# Patient Record
Sex: Female | Born: 1970
Health system: Southern US, Community
[De-identification: ages and names within clinical notes are randomized; demographics above are authoritative.]

## PROBLEM LIST (undated history)

## (undated) DIAGNOSIS — G43909 Migraine, unspecified, not intractable, without status migrainosus: Secondary | ICD-10-CM

## (undated) DIAGNOSIS — R110 Nausea: Secondary | ICD-10-CM

## (undated) DIAGNOSIS — N926 Irregular menstruation, unspecified: Secondary | ICD-10-CM

## (undated) DIAGNOSIS — M549 Dorsalgia, unspecified: Secondary | ICD-10-CM

## (undated) DIAGNOSIS — R599 Enlarged lymph nodes, unspecified: Secondary | ICD-10-CM

## (undated) DIAGNOSIS — M2550 Pain in unspecified joint: Secondary | ICD-10-CM

## (undated) DIAGNOSIS — K5900 Constipation, unspecified: Secondary | ICD-10-CM

## (undated) DIAGNOSIS — Z8041 Family history of malignant neoplasm of ovary: Secondary | ICD-10-CM

## (undated) DIAGNOSIS — K589 Irritable bowel syndrome without diarrhea: Secondary | ICD-10-CM

## (undated) DIAGNOSIS — I493 Ventricular premature depolarization: Secondary | ICD-10-CM

## (undated) DIAGNOSIS — Z87442 Personal history of urinary calculi: Secondary | ICD-10-CM

## (undated) DIAGNOSIS — E669 Obesity, unspecified: Secondary | ICD-10-CM

## (undated) DIAGNOSIS — R519 Headache, unspecified: Secondary | ICD-10-CM

## (undated) DIAGNOSIS — K529 Noninfective gastroenteritis and colitis, unspecified: Secondary | ICD-10-CM

## (undated) DIAGNOSIS — K219 Gastro-esophageal reflux disease without esophagitis: Secondary | ICD-10-CM

## (undated) DIAGNOSIS — E559 Vitamin D deficiency, unspecified: Secondary | ICD-10-CM

## (undated) DIAGNOSIS — R002 Palpitations: Secondary | ICD-10-CM

## (undated) DIAGNOSIS — R51 Headache: Secondary | ICD-10-CM

## (undated) DIAGNOSIS — R079 Chest pain, unspecified: Secondary | ICD-10-CM

## (undated) DIAGNOSIS — N200 Calculus of kidney: Secondary | ICD-10-CM

## (undated) DIAGNOSIS — N840 Polyp of corpus uteri: Secondary | ICD-10-CM

## (undated) DIAGNOSIS — K449 Diaphragmatic hernia without obstruction or gangrene: Secondary | ICD-10-CM

## (undated) DIAGNOSIS — N920 Excessive and frequent menstruation with regular cycle: Secondary | ICD-10-CM

## (undated) DIAGNOSIS — G4700 Insomnia, unspecified: Secondary | ICD-10-CM

## (undated) DIAGNOSIS — R0600 Dyspnea, unspecified: Secondary | ICD-10-CM

## (undated) DIAGNOSIS — K649 Unspecified hemorrhoids: Secondary | ICD-10-CM

## (undated) DIAGNOSIS — M25559 Pain in unspecified hip: Secondary | ICD-10-CM

## (undated) HISTORY — DX: Dorsalgia, unspecified: M54.9

## (undated) HISTORY — DX: Diaphragmatic hernia without obstruction or gangrene: K44.9

## (undated) HISTORY — DX: Family history of malignant neoplasm of ovary: Z80.41

## (undated) HISTORY — PX: ABLATION: SHX5711

## (undated) HISTORY — DX: Irritable bowel syndrome, unspecified: K58.9

## (undated) HISTORY — DX: Excessive and frequent menstruation with regular cycle: N92.0

## (undated) HISTORY — DX: Palpitations: R00.2

## (undated) HISTORY — DX: Vitamin D deficiency, unspecified: E55.9

## (undated) HISTORY — DX: Calculus of kidney: N20.0

## (undated) HISTORY — DX: Enlarged lymph nodes, unspecified: R59.9

## (undated) HISTORY — DX: Nausea: R11.0

## (undated) HISTORY — DX: Insomnia, unspecified: G47.00

## (undated) HISTORY — DX: Pain in unspecified joint: M25.50

## (undated) HISTORY — PX: KIDNEY STONE SURGERY: SHX686

## (undated) HISTORY — DX: Pain in unspecified hip: M25.559

## (undated) HISTORY — DX: Migraine, unspecified, not intractable, without status migrainosus: G43.909

## (undated) HISTORY — DX: Obesity, unspecified: E66.9

## (undated) HISTORY — DX: Headache: R51

## (undated) HISTORY — DX: Dyspnea, unspecified: R06.00

## (undated) HISTORY — DX: Headache, unspecified: R51.9

## (undated) HISTORY — DX: Ventricular premature depolarization: I49.3

## (undated) HISTORY — DX: Noninfective gastroenteritis and colitis, unspecified: K52.9

## (undated) HISTORY — DX: Unspecified hemorrhoids: K64.9

## (undated) HISTORY — DX: Polyp of corpus uteri: N84.0

## (undated) HISTORY — DX: Chest pain, unspecified: R07.9

## (undated) HISTORY — DX: Gastro-esophageal reflux disease without esophagitis: K21.9

## (undated) HISTORY — DX: Irregular menstruation, unspecified: N92.6

## (undated) HISTORY — DX: Constipation, unspecified: K59.00

---

## 2000-06-14 ENCOUNTER — Encounter: Payer: Self-pay | Admitting: Obstetrics and Gynecology

## 2000-06-14 ENCOUNTER — Ambulatory Visit (HOSPITAL_COMMUNITY): Admission: RE | Admit: 2000-06-14 | Discharge: 2000-06-14 | Payer: Self-pay | Admitting: Obstetrics and Gynecology

## 2001-05-23 ENCOUNTER — Encounter: Payer: Self-pay | Admitting: Internal Medicine

## 2001-05-23 ENCOUNTER — Ambulatory Visit (HOSPITAL_COMMUNITY): Admission: RE | Admit: 2001-05-23 | Discharge: 2001-05-23 | Payer: Self-pay | Admitting: Internal Medicine

## 2001-06-27 ENCOUNTER — Ambulatory Visit (HOSPITAL_COMMUNITY): Admission: RE | Admit: 2001-06-27 | Discharge: 2001-06-27 | Payer: Self-pay | Admitting: Neurology

## 2001-06-27 ENCOUNTER — Encounter: Payer: Self-pay | Admitting: Neurology

## 2002-05-22 ENCOUNTER — Ambulatory Visit (HOSPITAL_COMMUNITY): Admission: RE | Admit: 2002-05-22 | Discharge: 2002-05-22 | Payer: Self-pay | Admitting: Internal Medicine

## 2002-05-22 ENCOUNTER — Encounter: Payer: Self-pay | Admitting: Internal Medicine

## 2002-05-25 ENCOUNTER — Ambulatory Visit (HOSPITAL_COMMUNITY): Admission: RE | Admit: 2002-05-25 | Discharge: 2002-05-25 | Payer: Self-pay | Admitting: Internal Medicine

## 2002-05-25 ENCOUNTER — Encounter: Payer: Self-pay | Admitting: Internal Medicine

## 2002-08-17 ENCOUNTER — Encounter: Payer: Self-pay | Admitting: *Deleted

## 2002-08-17 ENCOUNTER — Emergency Department (HOSPITAL_COMMUNITY): Admission: EM | Admit: 2002-08-17 | Discharge: 2002-08-17 | Payer: Self-pay | Admitting: *Deleted

## 2003-01-19 HISTORY — PX: COLONOSCOPY: SHX174

## 2003-06-13 ENCOUNTER — Inpatient Hospital Stay (HOSPITAL_COMMUNITY): Admission: EM | Admit: 2003-06-13 | Discharge: 2003-06-15 | Payer: Self-pay | Admitting: *Deleted

## 2003-07-29 ENCOUNTER — Emergency Department (HOSPITAL_COMMUNITY): Admission: EM | Admit: 2003-07-29 | Discharge: 2003-07-29 | Payer: Self-pay | Admitting: Emergency Medicine

## 2003-09-12 ENCOUNTER — Ambulatory Visit (HOSPITAL_COMMUNITY): Admission: RE | Admit: 2003-09-12 | Discharge: 2003-09-12 | Payer: Self-pay | Admitting: Urology

## 2004-01-19 DIAGNOSIS — J189 Pneumonia, unspecified organism: Secondary | ICD-10-CM

## 2004-01-19 HISTORY — DX: Pneumonia, unspecified organism: J18.9

## 2004-02-01 ENCOUNTER — Emergency Department (HOSPITAL_COMMUNITY): Admission: EM | Admit: 2004-02-01 | Discharge: 2004-02-01 | Payer: Self-pay | Admitting: Emergency Medicine

## 2004-06-18 ENCOUNTER — Ambulatory Visit (HOSPITAL_COMMUNITY): Admission: RE | Admit: 2004-06-18 | Discharge: 2004-06-18 | Payer: Self-pay | Admitting: Obstetrics and Gynecology

## 2004-11-24 ENCOUNTER — Inpatient Hospital Stay (HOSPITAL_COMMUNITY): Admission: RE | Admit: 2004-11-24 | Discharge: 2004-11-25 | Payer: Self-pay | Admitting: Obstetrics and Gynecology

## 2005-02-24 ENCOUNTER — Emergency Department (HOSPITAL_COMMUNITY): Admission: EM | Admit: 2005-02-24 | Discharge: 2005-02-24 | Payer: Self-pay | Admitting: Emergency Medicine

## 2005-10-21 ENCOUNTER — Ambulatory Visit: Payer: Self-pay | Admitting: Internal Medicine

## 2005-10-25 ENCOUNTER — Ambulatory Visit: Payer: Self-pay | Admitting: Internal Medicine

## 2005-10-29 ENCOUNTER — Ambulatory Visit (HOSPITAL_COMMUNITY): Admission: RE | Admit: 2005-10-29 | Discharge: 2005-10-29 | Payer: Self-pay | Admitting: Orthopaedic Surgery

## 2006-01-02 ENCOUNTER — Emergency Department (HOSPITAL_COMMUNITY): Admission: EM | Admit: 2006-01-02 | Discharge: 2006-01-02 | Payer: Self-pay | Admitting: Emergency Medicine

## 2006-01-07 ENCOUNTER — Ambulatory Visit: Payer: Self-pay | Admitting: Internal Medicine

## 2006-01-18 HISTORY — PX: KNEE ARTHROSCOPY: SHX127

## 2006-01-18 HISTORY — PX: OTHER SURGICAL HISTORY: SHX169

## 2006-02-01 ENCOUNTER — Encounter (INDEPENDENT_AMBULATORY_CARE_PROVIDER_SITE_OTHER): Payer: Self-pay | Admitting: Specialist

## 2006-02-01 ENCOUNTER — Ambulatory Visit (HOSPITAL_COMMUNITY): Admission: RE | Admit: 2006-02-01 | Discharge: 2006-02-01 | Payer: Self-pay | Admitting: Internal Medicine

## 2006-02-01 ENCOUNTER — Ambulatory Visit: Payer: Self-pay | Admitting: Internal Medicine

## 2006-04-06 ENCOUNTER — Encounter (HOSPITAL_COMMUNITY): Admission: RE | Admit: 2006-04-06 | Discharge: 2006-05-06 | Payer: Self-pay | Admitting: Specialist

## 2006-05-10 ENCOUNTER — Encounter (HOSPITAL_COMMUNITY): Admission: RE | Admit: 2006-05-10 | Discharge: 2006-06-09 | Payer: Self-pay | Admitting: Specialist

## 2006-06-10 ENCOUNTER — Encounter (HOSPITAL_COMMUNITY): Admission: RE | Admit: 2006-06-10 | Discharge: 2006-07-10 | Payer: Self-pay | Admitting: Specialist

## 2006-07-12 ENCOUNTER — Encounter (HOSPITAL_COMMUNITY): Admission: RE | Admit: 2006-07-12 | Discharge: 2006-08-11 | Payer: Self-pay | Admitting: Specialist

## 2007-01-18 ENCOUNTER — Ambulatory Visit: Payer: Self-pay | Admitting: Internal Medicine

## 2007-03-06 ENCOUNTER — Encounter: Payer: Self-pay | Admitting: Cardiovascular Disease

## 2007-03-09 ENCOUNTER — Ambulatory Visit (HOSPITAL_COMMUNITY): Admission: RE | Admit: 2007-03-09 | Discharge: 2007-03-09 | Payer: Self-pay | Admitting: Cardiovascular Disease

## 2007-03-09 ENCOUNTER — Ambulatory Visit: Payer: Self-pay | Admitting: Cardiovascular Disease

## 2007-03-13 ENCOUNTER — Ambulatory Visit: Payer: Self-pay | Admitting: Cardiology

## 2007-03-13 ENCOUNTER — Ambulatory Visit (HOSPITAL_COMMUNITY): Admission: RE | Admit: 2007-03-13 | Discharge: 2007-03-13 | Payer: Self-pay | Admitting: Cardiovascular Disease

## 2007-03-16 ENCOUNTER — Ambulatory Visit: Payer: Self-pay | Admitting: Cardiovascular Disease

## 2007-04-05 ENCOUNTER — Ambulatory Visit: Payer: Self-pay | Admitting: Internal Medicine

## 2007-04-13 ENCOUNTER — Ambulatory Visit (HOSPITAL_COMMUNITY): Admission: RE | Admit: 2007-04-13 | Discharge: 2007-04-13 | Payer: Self-pay | Admitting: Orthopaedic Surgery

## 2007-04-24 ENCOUNTER — Other Ambulatory Visit: Admission: RE | Admit: 2007-04-24 | Discharge: 2007-04-24 | Payer: Self-pay | Admitting: Obstetrics and Gynecology

## 2007-05-03 ENCOUNTER — Ambulatory Visit (HOSPITAL_COMMUNITY): Admission: RE | Admit: 2007-05-03 | Discharge: 2007-05-03 | Payer: Self-pay | Admitting: Obstetrics and Gynecology

## 2007-06-13 ENCOUNTER — Encounter (HOSPITAL_COMMUNITY): Admission: RE | Admit: 2007-06-13 | Discharge: 2007-07-13 | Payer: Self-pay | Admitting: Orthopaedic Surgery

## 2007-08-17 ENCOUNTER — Ambulatory Visit (HOSPITAL_COMMUNITY): Admission: RE | Admit: 2007-08-17 | Discharge: 2007-08-17 | Payer: Self-pay | Admitting: Orthopaedic Surgery

## 2007-09-21 DIAGNOSIS — R110 Nausea: Secondary | ICD-10-CM | POA: Insufficient documentation

## 2007-09-21 DIAGNOSIS — K5289 Other specified noninfective gastroenteritis and colitis: Secondary | ICD-10-CM | POA: Insufficient documentation

## 2007-09-21 DIAGNOSIS — K5900 Constipation, unspecified: Secondary | ICD-10-CM | POA: Insufficient documentation

## 2007-09-21 DIAGNOSIS — E669 Obesity, unspecified: Secondary | ICD-10-CM | POA: Insufficient documentation

## 2007-09-21 DIAGNOSIS — R109 Unspecified abdominal pain: Secondary | ICD-10-CM | POA: Insufficient documentation

## 2007-09-21 DIAGNOSIS — R197 Diarrhea, unspecified: Secondary | ICD-10-CM | POA: Insufficient documentation

## 2007-09-21 DIAGNOSIS — K589 Irritable bowel syndrome without diarrhea: Secondary | ICD-10-CM | POA: Insufficient documentation

## 2007-09-27 ENCOUNTER — Emergency Department (HOSPITAL_COMMUNITY): Admission: EM | Admit: 2007-09-27 | Discharge: 2007-09-27 | Payer: Self-pay | Admitting: Emergency Medicine

## 2008-01-04 ENCOUNTER — Ambulatory Visit: Payer: Self-pay | Admitting: Internal Medicine

## 2008-01-05 ENCOUNTER — Ambulatory Visit (HOSPITAL_COMMUNITY): Admission: RE | Admit: 2008-01-05 | Discharge: 2008-01-05 | Payer: Self-pay | Admitting: Internal Medicine

## 2008-01-24 ENCOUNTER — Encounter: Payer: Self-pay | Admitting: Gastroenterology

## 2008-01-24 LAB — CONVERTED CEMR LAB
Helicobacter Pylori Antibody-IgG: 0.6
IgA: 186 mg/dL (ref 68–378)
Tissue Transglutaminase Ab, IgA: 0.3 units (ref <7)

## 2008-02-19 HISTORY — PX: ESOPHAGOGASTRODUODENOSCOPY: SHX1529

## 2008-03-08 ENCOUNTER — Emergency Department (HOSPITAL_COMMUNITY): Admission: EM | Admit: 2008-03-08 | Discharge: 2008-03-08 | Payer: Self-pay | Admitting: Emergency Medicine

## 2008-03-13 ENCOUNTER — Ambulatory Visit (HOSPITAL_COMMUNITY): Admission: RE | Admit: 2008-03-13 | Discharge: 2008-03-13 | Payer: Self-pay | Admitting: Internal Medicine

## 2008-03-13 ENCOUNTER — Ambulatory Visit: Payer: Self-pay | Admitting: Internal Medicine

## 2008-03-19 ENCOUNTER — Ambulatory Visit (HOSPITAL_COMMUNITY): Admission: RE | Admit: 2008-03-19 | Discharge: 2008-03-19 | Payer: Self-pay | Admitting: Internal Medicine

## 2008-05-01 ENCOUNTER — Encounter: Payer: Self-pay | Admitting: Internal Medicine

## 2008-05-02 ENCOUNTER — Ambulatory Visit: Payer: Self-pay | Admitting: Internal Medicine

## 2008-05-09 LAB — CONVERTED CEMR LAB
Basophils Absolute: 0 10*3/uL (ref 0.0–0.1)
Basophils Relative: 0 % (ref 0–1)
Eosinophils Absolute: 0.1 10*3/uL (ref 0.0–0.7)
Eosinophils Relative: 1 % (ref 0–5)
HCT: 39.5 % (ref 36.0–46.0)
Hemoglobin: 12.8 g/dL (ref 12.0–15.0)
Lymphocytes Relative: 26 % (ref 12–46)
Lymphs Abs: 2.3 10*3/uL (ref 0.7–4.0)
MCHC: 32.4 g/dL (ref 30.0–36.0)
MCV: 82.5 fL (ref 78.0–100.0)
Monocytes Absolute: 0.7 10*3/uL (ref 0.1–1.0)
Monocytes Relative: 8 % (ref 3–12)
Neutro Abs: 5.7 10*3/uL (ref 1.7–7.7)
Neutrophils Relative %: 65 % (ref 43–77)
Platelets: 277 10*3/uL (ref 150–400)
RBC: 4.79 M/uL (ref 3.87–5.11)
RDW: 13.4 % (ref 11.5–15.5)
WBC: 8.8 10*3/uL (ref 4.0–10.5)

## 2008-05-30 ENCOUNTER — Ambulatory Visit (HOSPITAL_COMMUNITY): Admission: RE | Admit: 2008-05-30 | Discharge: 2008-05-30 | Payer: Self-pay | Admitting: Obstetrics and Gynecology

## 2008-07-17 DIAGNOSIS — R0602 Shortness of breath: Secondary | ICD-10-CM | POA: Insufficient documentation

## 2008-07-17 DIAGNOSIS — I493 Ventricular premature depolarization: Secondary | ICD-10-CM | POA: Insufficient documentation

## 2008-07-17 DIAGNOSIS — R079 Chest pain, unspecified: Secondary | ICD-10-CM | POA: Insufficient documentation

## 2008-07-17 DIAGNOSIS — R002 Palpitations: Secondary | ICD-10-CM | POA: Insufficient documentation

## 2008-07-17 DIAGNOSIS — I4949 Other premature depolarization: Secondary | ICD-10-CM | POA: Insufficient documentation

## 2008-07-18 ENCOUNTER — Encounter: Payer: Self-pay | Admitting: Gastroenterology

## 2009-09-09 ENCOUNTER — Other Ambulatory Visit: Admission: RE | Admit: 2009-09-09 | Discharge: 2009-09-09 | Payer: Self-pay | Admitting: Obstetrics and Gynecology

## 2009-09-12 ENCOUNTER — Ambulatory Visit (HOSPITAL_COMMUNITY): Admission: RE | Admit: 2009-09-12 | Discharge: 2009-09-12 | Payer: Self-pay | Admitting: Obstetrics & Gynecology

## 2009-11-11 ENCOUNTER — Ambulatory Visit: Payer: Self-pay | Admitting: Cardiology

## 2009-11-13 ENCOUNTER — Encounter: Payer: Self-pay | Admitting: Cardiology

## 2010-01-13 ENCOUNTER — Ambulatory Visit (HOSPITAL_COMMUNITY)
Admission: RE | Admit: 2010-01-13 | Discharge: 2010-01-13 | Payer: Self-pay | Source: Home / Self Care | Attending: Internal Medicine | Admitting: Internal Medicine

## 2010-02-08 ENCOUNTER — Encounter: Payer: Self-pay | Admitting: Orthopaedic Surgery

## 2010-02-19 NOTE — Assessment & Plan Note (Signed)
Summary: wants to re-establish w/cardiology due to chest pain/tg   Primary Provider:  Dwana Melena  CC:  chest pain.  History of Present Illness: Meghan [name] is a delightful 40 year old married white female who comes today referred by Drenda Freeze for recurrent chest pain.  Over the last couple years she's had several episodes of sharp stabbing pain above her left breast. Sometimes it radiates into her neck. She has no other symptoms with it though she does get anxious. It is not associated with exertion. It is not associated with any indigestion or heartburn or difficulty swallowing.  She's had this evaluated in the emergency room several times. One time she had a CT angiogram which was negative for pulmonary embolus. She was in the past by our practice with Dr.Nishan who felt she had some PVCs but noncardiac chest pain.  Her risk factors are obesity, lack of exercise. She does not smoke, no history of hypertension diabetes, no premature history of coronary disease in her family, total cholesterol was only 180 with an HDL 45 triglycerides 83 LDL 118 on last check in January 2000 and she does not smoke.  Also looking back at her chart, she had a 2-D echocardiogram in February 2009 which was normal.  Her discomfort is clearly not exertion related, in fact when she exercises she feels better. She has a lot of stress which we talked about at length.  Current Medications (verified): 1)  Hyomax-Sr 0.375 Mg Xr12h-Tab (Hyoscyamine Sulfate) .... One By Mouth Qam Prn 2)  Multivitamin .... Take 1 Tablet By Mouth Once A Day 3)  Colon Health .... Take 1 Tablet By Mouth Once A Day 4)  Phentermine Hcl 37.5 Mg Caps (Phentermine Hcl) .Marland Kitchen.. 1 Tablet By Mouth Once Daily 5)  Hcg Injection .... Once A Week  Allergies (verified): 1)  ! * Percocet  Past History:  Past Medical History: Last updated: 07/17/2008 PREMATURE VENTRICULAR CONTRACTIONS (ICD-427.69) CHEST PAIN-UNSPECIFIED (ICD-786.50) DYSPNEA  (ICD-786.05) PALPITATIONS (ICD-785.1)  normal echo 02/2007 Hx of NAUSEA (ICD-787.02) Hx of DIARRHEA, BLOODY (ICD-787.91) Hx of OBESITY (ICD-278.00) Hx of CONSTIPATION (ICD-564.00) Hx of COLITIS (ICD-558.9) Hx of IBS (ICD-564.1) Hx of DIARRHEA (ICD-787.91) Hx of ABDOMINAL CRAMPS (ICD-789.00)  Past Surgical History: Last updated: 07/17/2008 Knee Arthroscopy kindney stones  Family History: Last updated: 05/02/2008 Father: Healthy Mother: Healthy Siblings: One sister ( healthy)  Social History: Last updated: 07/17/2008 Marital Status: Married Children: 2 boys 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  Regular Exercise - yes Drug Use - no  Risk Factors: Exercise: yes (07/17/2008)  Risk Factors: Smoking Status: never (07/17/2008)  Review of Systems       negative other than history of present illness  Vital Signs:  Patient profile:   40 year old female Height:      69 inches Weight:      238 pounds BMI:     35.27 O2 Sat:      98 % Pulse rate:   86 / minute BP sitting:   124 / 73  Vitals Entered ByLarita Fife Via LPN (November 11, 2009 10:12 AM)  Physical Exam  General:  very pleasant, no acute distress, obese Head:  normocephalic and atraumatic Eyes:  PERRLA/EOM intact; conjunctiva and lids normal. Neck:  Neck supple, no JVD. No masses, thyromegaly or abnormal cervical nodes. Chest Wall:  no deformities or breast masses noted Lungs:  Clear bilaterally to auscultation and percussion. Heart:  PMI nondisplaced, regular rate and rhythm, normal S1-S2, no rub or click Abdomen:  Bowel sounds  positive; abdomen soft and non-tender without masses, organomegaly, or hernias noted. No hepatosplenomegaly. Msk:  Back normal, normal gait. Muscle strength and tone normal. Pulses:  pulses normal in all 4 extremities Extremities:  No clubbing or cyanosis. Neurologic:  Alert and oriented x 3. Skin:  Intact without lesions or rashes. Psych:  Normal affect.   EKG  Procedure date:   11/11/2009  Findings:      normal sinus rhythm, normal EKG  Impression & Recommendations:  Problem # 1:  CHEST PAIN-UNSPECIFIED (ICD-786.50) Assessment Deteriorated I had a long talk with the patient today. I reassured her this is not coronary pain nor cardiac discomfort. I suspect is related to stress most likely. I've encouraged her to block out time for her to exercise 3 hours a week. Encouraged her to lose weight. She is bordering on morbid obesity.  All questions answered and all studies reviewed in the past. No further diagnostic tests indicated this time.  Problem # 2:  PREMATURE VENTRICULAR CONTRACTIONS (ICD-427.69) Assessment: Unchanged  Problem # 3:  Hx of OBESITY (ICD-278.00) Assessment: Improved she has lost about 40 pounds, encouraged to continue to do so.  Patient Instructions: 1)  Your physician recommends that you schedule a follow-up appointment in: as needed  2)  Your physician recommends that you continue on your current medications as directed. Please refer to the Current Medication list given to you today. 3)  Your physician encouraged you to lose weight for better health.  Prevention & Chronic Care Immunizations   Influenza vaccine: Not documented    Tetanus booster: Not documented    Pneumococcal vaccine: Not documented  Other Screening   Pap smear: Not documented   Smoking status: never  (07/17/2008)  Lipids   Total Cholesterol: Not documented   LDL: Not documented   LDL Direct: Not documented   HDL: Not documented   Triglycerides: Not documented

## 2010-05-05 LAB — BASIC METABOLIC PANEL
BUN: 12 mg/dL (ref 6–23)
CO2: 25 mEq/L (ref 19–32)
Calcium: 9.3 mg/dL (ref 8.4–10.5)
Chloride: 103 mEq/L (ref 96–112)
Creatinine, Ser: 0.79 mg/dL (ref 0.4–1.2)
GFR calc Af Amer: >60 mL/min (ref >60)
GFR calc non Af Amer: >60 mL/min (ref >60)
Glucose, Bld: 88 mg/dL (ref 70–99)
Potassium: 3.7 mEq/L (ref 3.5–5.1)
Sodium: 137 mEq/L (ref 135–145)

## 2010-05-05 LAB — URINALYSIS, ROUTINE W REFLEX MICROSCOPIC
Bilirubin Urine: NEGATIVE
Glucose, UA: NEGATIVE mg/dL
Hgb urine dipstick: NEGATIVE
Ketones, ur: NEGATIVE mg/dL
Nitrite: NEGATIVE
Protein, ur: NEGATIVE mg/dL
Specific Gravity, Urine: 1.025 (ref 1.005–1.030)
Urobilinogen, UA: 0.2 mg/dL (ref 0.0–1.0)
pH: 6 (ref 5.0–8.0)

## 2010-05-05 LAB — CBC
HCT: 40 % (ref 36.0–46.0)
Hemoglobin: 13.3 g/dL (ref 12.0–15.0)
MCHC: 33.3 g/dL (ref 30.0–36.0)
MCV: 83.8 fL (ref 78.0–100.0)
Platelets: 230 10*3/uL (ref 150–400)
RBC: 4.78 MIL/uL (ref 3.87–5.11)
RDW: 13.3 % (ref 11.5–15.5)
WBC: 8.4 10*3/uL (ref 4.0–10.5)

## 2010-05-05 LAB — POCT CARDIAC MARKERS
CKMB, poc: <1 ng/mL — ABNORMAL LOW (ref 1.0–8.0)
Myoglobin, poc: 60.7 ng/mL (ref 12–200)
Troponin i, poc: <0.05 ng/mL (ref 0.00–0.09)

## 2010-05-05 LAB — DIFFERENTIAL
Basophils Absolute: 0 10*3/uL (ref 0.0–0.1)
Basophils Relative: 0 % (ref 0–1)
Eosinophils Absolute: 0 10*3/uL (ref 0.0–0.7)
Eosinophils Relative: 1 % (ref 0–5)
Lymphocytes Relative: 24 % (ref 12–46)
Lymphs Abs: 2 10*3/uL (ref 0.7–4.0)
Monocytes Absolute: 0.5 10*3/uL (ref 0.1–1.0)
Monocytes Relative: 6 % (ref 3–12)
Neutro Abs: 5.8 10*3/uL (ref 1.7–7.7)
Neutrophils Relative %: 69 % (ref 43–77)

## 2010-05-05 LAB — PREGNANCY, URINE: Preg Test, Ur: NEGATIVE

## 2010-05-05 LAB — URINE MICROSCOPIC-ADD ON

## 2010-06-02 ENCOUNTER — Ambulatory Visit (HOSPITAL_COMMUNITY)
Admission: RE | Admit: 2010-06-02 | Discharge: 2010-06-02 | Disposition: A | Discharge status: Home / Self Care | Payer: Federal, State, Local not specified - PPO | Source: Ambulatory Visit | Attending: Orthopaedic Surgery | Admitting: Orthopaedic Surgery

## 2010-06-02 DIAGNOSIS — M545 Low back pain, unspecified: Secondary | ICD-10-CM | POA: Insufficient documentation

## 2010-06-02 DIAGNOSIS — M25559 Pain in unspecified hip: Secondary | ICD-10-CM | POA: Insufficient documentation

## 2010-06-02 DIAGNOSIS — IMO0001 Reserved for inherently not codable concepts without codable children: Secondary | ICD-10-CM | POA: Insufficient documentation

## 2010-06-02 NOTE — Op Note (Signed)
Meghan [name], Meghan [name]             ACCOUNT NO.:  [id number]   MEDICAL RECORD NO.:  [id number]          PATIENT TYPE:  AMB   LOCATION:  DAY                           FACILITY:  [facility]   PHYSICIAN:  R. Roetta Sessions, M.D. DATE OF BIRTH:  [birth year]   DATE OF PROCEDURE:  DATE OF DISCHARGE:                               OPERATIVE REPORT   PROCEDURE:  EGD with Maloney dilation.   INDICATIONS FOR PROCEDURE:  A 40 year old lady with longstanding  diarrhea and predominant irritable bowel syndrome over the past 8 to 9  months now has had intermittent upper abdominal epigastric discomfort  and really describes left lower anterior chest discomfort oftentimes  post prandial and sometimes has a bad taste in her mouth.  She took a  brief course of Nexium, and this just simply gave her a headache but did  not alter the above symptoms in any way.  She does not really have any  typical reflux symptoms.  She does describe dysphagia to rice and hot  dogs from time to time.  Prior barium pill esophagram upper GI series  demonstrated duodenitis, no obstruction or anatomical abnormality of the  esophagus.  We have offered her an EGD back in December.  She declined  until now.  EGD is now being done to further evaluate her symptoms.  The  potential for esophageal dilatation were reviewed.  Risks, benefits and  alternatives were also discussed.  Please see documentation of medical  record.   PROCEDURE NOTE:  O2 saturation, blood pressure, pulses, and respirations  were monitored throughout the entire procedure.  Conscious sedation with  Versed 7 mg IV and Demerol 125 mg IV in divided doses.  Phenergan 12.5  mg dilute slow IV-pushed to augment conscious sedation.  Cetacaine spray  for topical oropharyngeal anesthesia.   FINDINGS:  EGD examination of the tubular esophagus revealed no mucosal  abnormalities.  The EG junction was patulous.   STOMACH:  Gastric cavity insufflated well with air.  A thorough  examination of the gastric mucosa, including retroflexed to proximal  stomach and esophagogastric junction demonstrated only a small hiatal  hernia.  The pylorus was patent and easily transversed.  Examination of  the bulb and second portion revealed no abnormalities.   THERAPEUTICS/DIAGNOSTIC MANEUVERS PERFORMED:  A 56 French Maloney  dilator was passed to full insertion with ease.  A look back revealed no  complication related to passage of the dilator.  The patient tolerated  the procedure well and was reactive.   ENDOSCOPY IMPRESSION:  1. Normal-appearing tubular esophagus.  A small hiatal hernia,      otherwise normal stomach, D1, D2. Status post passage of a 54      French Maloney dilator.  2. No endoscopic explanation of the patient's symptoms based on      today's examination.  Hopefully dysphagia will resolve.  Not      mentioned above, she did have a negative transglutaminase antibody      and a normal serum IgA level.  Her Helicobacter pylori serologies      also came back negative.  She is  describing pain in her left lower      anterior rib cage.  It has some tenderness of the area, but it is      difficult to explain the post-prandial component      to these symptoms.  Has not had a recent gallbladder ultrasound.  I      told her and her husband we ought to go ahead and do a gallbladder      workup starting with an ultrasound in the near future, and then we      will render further recommendations.      Jonathon Bellows, M.D.  Electronically Signed     RMR/MEDQ  D:  03/13/2008  T:  03/13/2008  Job:  [id number]   cc:   Catalina Pizza, M.D.  Fax: [phone]

## 2010-06-02 NOTE — Assessment & Plan Note (Signed)
NAMEMarland Kitchen  Meghan [name], Meghan [name]              CHART#:  [id number]   DATE:                                   DOB:  11-24-1970   Follow up abdominal cramps and diarrhea. History of irritable bowel  syndrome. Meghan [name] has had a tendency towards post-prandial abdominal  cramps and diarrhea, going back a good four years. Prior colonoscopy in  2005 demonstrated some suggestion of localized cecal colitis. Dr.  __________ performed a colonoscopy just over a year ago. It revealed  some minimal focal active colitis in the sigmoid found to be resolving  food-born illness. Endoscopically things look good as far as the rectum,  colon, and terminal ileum are concerned. She has done really well on  HyoMax 0.375 mg once daily. She recently got constipated and felt like a  bowling ball in her pelvis and had some right lower quadrant pain and  pain all the way down both legs until she had a bowel movement. It is  interesting she feels that if she does not take HyoMax she will not have  a bowel movement but taking it she estimates 90% of the time she has one  to two formed bowel movements daily. She has not had any rectal  bleeding. She feels this is a very important part of her regimen. She  has been taking some naproxen recently which may have caused some vague  abdominal cramps. Her appendix is in situ. She is reported to have  uterine fibroids. She is seeing Dr. Emelda Fear next week for a GYN  evaluation.   She tells me she always has right lower quadrant abdominal pain. She  told numerous physicians in the past, and no further workup has ensued.  She notices it almost every day. It has not really been a major bother  for her. There is no radiation with it. It may worsen when she is  constipated, but it is not really otherwise affected by having a bowel  movement or eating. Stool studies back in January for a flair of  diarrhea were all negative except for moderate yeast seen. She has been  on a probiotic,  Align, once daily in addition to HyoMax.   CURRENT MEDICATIONS:  See updated list.   ALLERGIES:  No known drug allergies.   FAMILY HISTORY:  No chronic gastrointestinal or liver illness.   PHYSICAL EXAMINATION:  GENERAL:  She appears well. Weight 262, height 5  feet 9 inches.  VITAL SIGNS:  Temperature 97.9.  Blood pressure 120/82, pulse 84.  SKIN:  Warm and dry.  ABDOMEN:  Flat, positive bowel sounds, soft. She has minimal right lower  quadrant tenderness to palpation. No appreciable mass or organomegaly.   ASSESSMENT:  Almost certainly Meghan [name] has diarrhea predominant  irritable bowel syndrome. HyoMax has kept her middle of the road the  vast majority of the time. I pointed out to her that nonsteroidals could  cause some gastrointestinal upset. She should avoid them as much as  possible. Vague background right lower quadrant abdominal pain could be  GYN in origin. It could be functional and related to irritable bowel  syndrome or it could be chronic appendicitis. I do not think we need to  do anything specifically in regards to that at this time.   RECOMMENDATIONS:  1. Continue  HyoMax daily. If she goes a day without a bowel movement      she should stop this agent until bowel function resumes. Take Align      once daily for another three months and then stop.  2. Adequate fruits and vegetables/fiber intake. Emphasized benign but      chronic nature of irritable bowel syndrome. We will plan to see      this nice lady back in three months.       Jonathon Bellows, M.D.  Electronically Signed     RMR/MEDQ  D:  04/05/2007  T:  04/05/2007  Job:  [id number]   cc:   Catalina Pizza, M.D.  [name], M.D.

## 2010-06-02 NOTE — Assessment & Plan Note (Signed)
[facility]                       [facility]ARDI[facility] OFFICE NOTE   NAME:[name], Meghan PORE                    MRN:          [id number]  DATE:03/09/2007                            DOB:          [birth year]    Meghan [name] seen today at the request of Dr. Margo Aye.  She has been having  chest pain, shortness of breath, palpitations.   The patient has been sick since about Monday.  She has no previously  documented heart problems.   She had noticed palpitations and flip-flops in her heart.  She then felt  that from the umbilicus up in her chest just felt abnormal.  She  subsequently had some exertional shortness of breath also some mild  chest pain.  The chest pain is atypical and exertional related.  It was  sharp.  It was between her breasts.  It was fleeting and has not  progressed with no radiation to the arm.  She also feels a little bit  lightheaded particularly when she tries to take a deep breath.   The patient was feeling ill at work on Monday.  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  She went over to the ER and  apparently got hooked up to an EKG machine and was told by Dr. Colon Branch  she was have occasional PVCs.   In talking to the patient, there is a question previous history of low  potassium when she had a bout of colitis.  This has not been recently  checked.  She has not had any change in her medications.  She denies any  fever, cough or sputum production.  There is no one around her that has  been ill.  She does feel in general weak.  She he is not in the middle  of her period and she tends to have small periods.  There is no history  of anemia or thyroid disease.   Currently she continues to just not feel herself.  She cannot put her  finger on it but lot of her symptoms seem to revolve around her chest  with the various periods of shortness of breath, atypical pain, and  dizziness.  She has not had any frank syncope.   REVIEW OF SYSTEMS:  Otherwise negative.   PAST MEDICAL HISTORY:  Is fairly benign.  She has had previous right  knee surgery previous kidney stone.  She has had 2 babies.  There is a  history of colitis which is not active.   She has no known allergies.  She is on hyoscyamine for irritable bowel  syndrome and multivitamins.   Her family history is remarkable for mother being alive and well, as  helping her with her children.  Father is alive without premature  coronary disease.   The patient is happily married.  Her husband works at the [employer]  center [employer].  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  She has a 78-year-old and 53-year-old.  She is fairly  sedentary.   She does not smoke or drink.   EXAM:  Is remarkable  for weight 256, blood pressure 116/78, pulse 90 and  regular, afebrile, respiratory rate 14.  HEENT:  Unremarkable.  Carotids are without bruit.  No lymphadenopathy, thyromegaly, JVP  elevation.  LUNGS:  Clear diaphragmatic motion.  No wheezing.  S1-S2 normal heart sounds.  PMI normal.  ABDOMEN:  Benign bowel sounds positive AAA no tenderness.  No  hepatosplenomegaly or hepatojugular reflux.  EXTREMITIES:  Pulses intact.  No edema.  NEURO:  Nonfocal.  SKIN:  Warm and dry.  No muscular weakness.  She has colored contact lenses in.   Her EKG is totally normal sinus rhythm and rate of 90, PR interval 154,  QT interval 380.   IMPRESSION:  1. Palpitations with premature ventricular contractions documented in      the emergency room.  Will have an event monitor given to the      patient to rule out any significant arrhythmias.  No indication for      beta blocker at this time.  2. Dyspnea likely related to possible upper respiratory infection or      viral illness.  The patient is overweight.  Check 2-D      echocardiogram to rule out occult pulmonary hypertension.  Check RV      and LV function.  No murmurs on exam.  3. Chest  pain, atypical.  I do not think she needs a stress test at      this time.  She has a totally normal EKG.  However, we will do lab      work given her constellation of symptoms I would like to check a      sed rate, TSH, T4, BMET to make sure potassium and magnesium is not      low.  Hemoglobin hematocrit to make sure she is not anemic.  Since      the constellation of her symptoms also revolve around her chest,      think a baseline PA and lateral chest x-ray would be in order to      make sure there is no lung abnormalities, bony abnormalities, and      assess her mediastinum and heart size.  She will see me back in      about 2 weeks after she has these tests.  Overall I think she      should do fine.  I suspect she has had benign palpitations, but      Meghan [name] seems particularly concerned about the constellation of      symptoms she is having and I think an initial workup was      worthwhile.     Noralyn Pick. Eden Emms, MD, [facility]  Electronically Signed    PCN/MedQ  DD: 03/09/2007  DT: 03/10/2007  Job #: [id number]

## 2010-06-02 NOTE — Assessment & Plan Note (Signed)
NAMEMarland Kitchen  MALAAK, STACH              CHART#:  [id number]   DATE:  01/18/2007                       DOB:  Jul 21, 1970   CHIEF COMPLAINT:  Diarrhea, abdominal pain, nausea.   SUBJECTIVE:  Meghan [name] is here for a semiurgent visit.  She called 2 days  ago with complaints of abdominal cramping and diarrhea and nausea.  Symptoms began Friday evening.  She started having abdominal cramping  followed by loose stools.  She was able to sleep well during the night.  She had more abdominal cramping and loose stools Saturday.  The last  couple of days her stools have been normal but she had some residual  abdominal tenderness and nausea.  She denies any ill contacts.  No  fevers or chills.  She has not had any major changes in her diet but did  consume some corn the day before this happened.  She has been on her  HyoMax-SR as usual.  She has a history of colitis approximately a year  ago revealed on CT.  She had a colonoscopy with terminal ileoscopy in  January 2008 which was normal.  Random biopsies showed some minimal  nonspecific focal active neutrophilic cryptitis most consistent with  resolving infectious colitis.  Prior colonoscopy before that by Dr.  Katrinka Blazing revealed some colitis of the cecum and ICV back in May 2005, but  this appeared normal the last time.  Really, no evidence to indicate  IBD.  She was felt to have IBS.   CURRENT MEDICATIONS:  See updated list.   ALLERGIES:  No known drug allergies.   PHYSICAL EXAM:  Weight 262, which is up from 250 pounds a year ago, temp  98.1, blood pressure 110/82, pulse 68.  GENERAL:  Pleasant, well-nourished, well-developed obese Caucasian  female in no acute distress.  SKIN:  Warm and dry, no jaundice.  HEENT:  Sclerae anicteric.  ABDOMEN:  Positive bowel sounds.  Abdomen is soft.  She has minimal  tenderness in the suprapubic region to deep palpation.  No rebound  tenderness or guarding.  No abdominal bruits or hernias.  No  hepatosplenomegaly  or masses.  LOWER EXTREMITIES:  No edema.   IMPRESSION:  Meghan [name] is a 40 year old lady with history of irritable  bowel syndrome who presents with acute onset of abdominal cramping,  diarrhea, nausea.  Suspect she has gastroenteritis versus flare of her  irritable bowel syndrome.  Symptoms are resolving on their own.  Would  not pursue any further workup at this time unless she develops recurrent  symptoms.   PLAN:  1. Continue HyoMax as before.  2. Discussed slow gradual weight loss with patient today.  3. She will call if she has any recurrent problems.       Tana Coast, P.A.  Electronically Signed     R. Roetta Sessions, M.D.  Electronically Signed    LL/MEDQ  D:  01/18/2007  T:  01/18/2007  Job:  [id number]   cc:   Catalina Pizza, M.D.

## 2010-06-02 NOTE — Assessment & Plan Note (Signed)
NAMEMarland Kitchen  LEONE, MOBLEY              CHART#:  [id number]   DATE:  01/04/2008                       DOB:  Nov 03, 1970   CHIEF COMPLAINT:  Abdominal pain and nausea.   SUBJECTIVE:  The patient is here for further evaluation of abdominal  pain and nausea which has been worse over the last couple of months.  She was last seen in March 2009.  She is felt to have diarrhea,  predominant IBS.  She was last seen by Dr. Jena Gauss.  A couple of months  ago, she has had some problems with abdominal pain.  She also got  constipated.  We were not able to work her into our schedule due to  conflict with her work schedule.  She did see Dr. Catalina Pizza.  He advised  her to stop HyoMax.  She states she has had basically progressive daily  discomfort in the epigastrium.  She states she feels very full and  bloated.  She developed pain in the left upper quadrant region when she  eats.  It occurs with every meal.  She describes it as a sharp stabbing  pain.  Occasionally, she has some right upper quadrant pain, but she  feels that it is gas.  She is not able to belch.  She denies heartburn,  but she has had a bad taste in her mouth.  She also notes nausea if her  stomach is empty.  She complains of dysphagia to solid foods which  occurs occasionally.  This happens with rice and hamburger.  Her  appetite is good.  Her bowel movements have been regular without the  HyoMax.  She has had some fecal urgency, but her stools remain soft  form, she has 1 or 2 a day.  Denies any melena or rectal bleeding.  She  denies any dysuria or hematuria.  Her menstrual cycles are regular.   PAST MEDICAL HISTORY:  She does have a history of nephrolithiasis,  having passed 8 stones and having lithotripsy on one occasion.  She has  also had a right ACL repair in 2008.   CURRENT MEDICATIONS:  See at the list.   CURRENT MEDICATIONS:  None.   ALLERGIES:  No known drug allergies.   PHYSICAL EXAMINATION:  VITAL SIGNS:  Weight 264,  stable; temperature  98.2; blood pressure 104/70; and pulse 76.  GENERAL:  Pleasant obese Caucasian female in no acute distress.  SKIN:  Warm and dry.  No jaundice.  HEENT:  Sclerae nonicteric.  Oropharyngeal mucosa moist and pink.  CHEST:  Lungs are clear to auscultation.  CARDIAC:  Regular rate and rhythm.  ABDOMEN:  Positive bowel sounds.  Abdomen is obese, soft.  She has  minimal tenderness in the left upper quadrant, epigastrium to deep  palpation.  No rebound or guarding.  No organomegaly or masses.  No  abdominal bruits or hernias.   IMPRESSION:  The patient is a 40 year old lady with history of diarrhea,  predominant irritable bowel syndrome.  Clinically these symptoms seem to  be under control at this time.  She complains of 41-month history of  epigastric discomfort, left upper quadrant tenderness postprandially as  well as a bad taste in her mouth and nausea on empty stomach.  She  denies any typical reflux symptoms.  She has some dysphagia to solid  foods.  She may be having atypical gastroesophageal reflux disease  symptoms.  Symptoms might be related to hiatal hernia.  Her symptoms  predominately appear to be upper gastrointestinal in origin.  I offered  her EGD, but she would like to pursue barium studies initially.   PLAN:  1. Barium pill esophagram upper GI series.  2. Trial on Nexium 40 mg daily, #15 samples provided.  3. Further recommendations to follow.       Tana Coast, P.A.  Electronically Signed     R. Roetta Sessions, M.D.  Electronically Signed    LL/MEDQ  D:  01/04/2008  T:  01/05/2008  Job:  [id number]   cc:   Catalina Pizza, M.D.

## 2010-06-02 NOTE — Procedures (Signed)
NAMEPRIM, MORACE             ACCOUNT NO.:  [id number]   MEDICAL RECORD NO.:  [id number]          PATIENT TYPE:  OUT   LOCATION:  RAD                           FACILITY:  [facility]   PHYSICIAN:  Gerrit Friends. Dietrich Pates, MD, FACCDATE OF BIRTH:  1970/12/13   DATE OF PROCEDURE:  03/13/2007  DATE OF DISCHARGE:                                ECHOCARDIOGRAM   CLINICAL DATA:  A 40 year old woman with palpitations, fatigue and  dyspnea.   1. Technically adequate echocardiographic study.  2. Normal left atrium, right atrium and right ventricle.  3. Normal aortic, mitral, tricuspid and pulmonic valves.  4. Normal proximal ascending aorta and normal proximal pulmonary      artery.  5. Normal internal dimension, wall thickness, regional and global      function of the left ventricle.      Gerrit Friends. Dietrich Pates, MD, [facility]  Electronically Signed     RMR/MEDQ  D:  03/14/2007  T:  03/14/2007  Job:  [id number]

## 2010-06-04 ENCOUNTER — Ambulatory Visit (HOSPITAL_COMMUNITY): Payer: Federal, State, Local not specified - PPO | Admitting: Physical Therapy

## 2010-06-05 NOTE — Op Note (Signed)
[name], [name]             ACCOUNT NO.:  [id number]   MEDICAL RECORD NO.:  [id number]          PATIENT TYPE:  INP   LOCATION:  LDR1                          FACILITY:  [facility]   PHYSICIAN:  Lazaro Arms, M.D.   DATE OF BIRTH:  October 26, 1970   DATE OF PROCEDURE:  11/24/2004  DATE OF DISCHARGE:                                 OPERATIVE REPORT   EPIDURAL NOTE PLACEMENT:  Meghan [name] is a 40 year old white female, gravida 2,  para 1, at 39-6/[redacted] weeks gestation who is admitted with a favorable cervix  for induction.  She is having regular uterine contractions and wanted an  early epidural.   The patient was placed in sitting position.  Betadine prep was used.  Lidocaine 1% was injected at the L3-L4 interspace.  A sterile drape was  placed.  A 17 Tuohy needle is used and a loss of resistance technique  employed and with one pass, the epidural space is found without difficulty.  Ten mL of 0.125% bupivacaine plain is given through the epidural space as a  test dose without ill effects.  An additional 10 mL is given to dose up the  epidural.  The continuous infusion is started after epidural catheter is fed  into the epidural space at 12 mL/h of 0.125% bupivacaine with 2 mcg/mL of  fentanyl.  The patient tolerated the procedure well.  Fetal heart rate is  stable.  The blood pressure is stable at the present time and the patient is  getting good relief.      Lazaro Arms, M.D.  Electronically Signed     LHE/MEDQ  D:  11/24/2004  T:  11/24/2004  Job:  [id number]

## 2010-06-05 NOTE — Op Note (Signed)
Meghan [name], Meghan [name]             ACCOUNT NO.:  [id number]   MEDICAL RECORD NO.:  [id number]          PATIENT TYPE:  AMB   LOCATION:  DAY                           FACILITY:  [facility]   PHYSICIAN:  Lionel December, M.D.    DATE OF BIRTH:  1970/04/11   DATE OF PROCEDURE:  02/01/2006  DATE OF DISCHARGE:                               OPERATIVE REPORT   PROCEDURE:  Colonoscopy with terminal ileoscopy.   INDICATION:  Meghan [name] is a 40 year old Caucasian female with recent  history of diarrhea with bleeding.  She had CT which revealed thickening  to the colonic wall especially in the transverse and descending and  proximal sigmoid colon.  She also had the pericolonic inflammatory  changes at descending colon.  Her stool studies been negative.  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  She is undergoing diagnostic colonoscopy to make  sure we not dealing with inflammatory bowel disease.  Her stool studies  been negative.  Procedure risks were reviewed with the patient and  informed consent was obtained.   MEDS FOR CONSCIOUS SEDATION:  Demerol 50 mg IV Versed 6 mg IV.   FINDINGS:  Procedure performed in endoscopy suite.  The patient's vital  signs and O2 sat were monitored during procedure and remained stable.  The patient was placed left lateral recumbent position.  Rectal  examination performed.  No abnormality noted on external or digital  exam.  The patient was placed left lateral position and rectal  examination performed.  No abnormality noted external or digital exam.  Pentax videoscope was placed rectum and advanced under vision into  sigmoid colon and beyond.  Preparation was excellent.  Scope was passed  into cecum which was identified by appendiceal orifice and ileocecal  valve.  Mucosa in this segment was normal.  T I was easily intubated and  examined for least 15 cm.  Mucosa had normal fatty appearance; however,  there  was patchy of coarse appearing mucosa but there was no fissuring  or ulcers noted.  Biopsy was taken from this patch.  As the scope was  withdrawn, colonic mucosa was once again carefully examined and was  normal throughout.  Random biopsies were taken from sigmoid colon mucosa  to looking for microscopic colitis.  Rectal mucosa was normal.  Scope  was retroflexed to examine anorectum which was unremarkable.  Endoscope  was then straightened and withdrawn.  The patient tolerated the  procedure well.   FINAL DIAGNOSIS:  No evidence of endoscopic colitis.  Random biopsies  taken from sigmoid colon looking for microscopic colitis.  No evidence  of ileitis or ileal ulcers.  Patch of coarse mucosa at terminal ileum  which was biopsied for histology.  Possibly normal variant.   RECOMMENDATIONS:  Levbid 1 tablet every morning.  High-fiber diet.  I will be contacting patient with results of biopsy and further  recommendations.      Lionel December, M.D.  Electronically Signed     NR/MEDQ  D:  02/01/2006  T:  02/01/2006  Job:  [id number]   cc:   Catalina Pizza, M.D.  Fax: [phone]

## 2010-06-05 NOTE — H&P (Signed)
Meghan [name], Meghan [name]             ACCOUNT NO.:  [id number]   MEDICAL RECORD NO.:  [id number]           PATIENT TYPE:  INP   LOCATION:  A401                          FACILITY:  [facility]   PHYSICIAN:  Lazaro Arms, M.D.   DATE OF BIRTH:  1970-06-22   DATE OF ADMISSION:  11/24/2004  DATE OF DISCHARGE:  LH                                HISTORY & PHYSICAL   CHIEF COMPLAINT:  Elective induction of labor due to cervical favorability.   HISTORY OF PRESENT ILLNESS:  Meghan [name] is a 40 year old, gravida 2, para 1  with an EDC of November 25, 2004 based on last menstrual period.  Her  ultrasound actually would place her at a week ahead but consensus EDC would  place her at [redacted] weeks gestation.  She had a prophylactic cerclage placed  this pregnancy at about 16 weeks that was uneventful.  It was removed on  October 9. She has been 3 cm, 50% effaced, -1 station for the past few  weeks.  Prenatal blood pressures 110's to 120's/60's to 80's.  Total weight  gain has been about 25 pounds with appropriate fundal height growth.   PRENATAL LABS:  Blood type O positive, rubella immune.  Hepatitis B surface  antigen, HIV, HSV, RPR, gonorrhea, Chlamydia, and group B strep were all  negative.  One-hour glucose tolerance test was normal at 117.  MSAFP was  also within normal limits.   PAST HISTORY:  1.  History of abnormal Pap smear.  2.  History of two cerclages.   ALLERGIES:  No known drug allergies.   SOCIAL HISTORY:  Denies cigarette smoking, alcohol or drug use.  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   OB HISTORY:  An 18-hour labor at 38 weeks in 2000.  Ended up having to have  a vacuum extraction of a 6 pound female.   PHYSICAL EXAMINATION:  HEENT:  Within normal limits.  HEART:  Regular rate and rhythm.  LUNGS:  Clear.  ABDOMEN:  Soft and nontender.  Fundal height of 40 cm.  It was difficult to  do a estimated fetal weight with Leopold's although the baby does  not feel  particularly big.  PELVIC:  Cervix 3 cm, 50% effaced, -1 station, vertex presentation.  EXTREMITIES:  Legs have trace edema.   IMPRESSION:  1.  Intrauterine pregnancy at 40 weeks.  2.  Induction of labor secondary to cervical favorability and maternal      request.   PLAN:  We will plan to start Pitocin and artificial rupture of membranes on  Tuesday, November 24, 2004.      Meghan [name], C.N.M.      Lazaro Arms, M.D.  Electronically Signed    FC/MEDQ  D:  11/19/2004  T:  11/19/2004  Job:  [id number]   cc:   Francoise Schaumann. Halford Chessman  Fax: [phone]

## 2010-06-05 NOTE — Op Note (Signed)
Meghan [name], Meghan [name]             ACCOUNT NO.:  [id number]   MEDICAL RECORD NO.:  [id number]          PATIENT TYPE:  INP   LOCATION:  LDR1                          FACILITY:  [facility]   PHYSICIAN:  [name], M.D. DATE OF BIRTH:  [birth year]   DATE OF PROCEDURE:  11/24/2004  DATE OF DISCHARGE:                                 OPERATIVE REPORT   DELIVERY NOTE:  Meghan [name] was examined and noted to be fully dilated at +2  station at around 12:55. She was having some variable decelerations with  contractions that would come up to the baseline in between contractions. She  started to push, and after about five or six pushes, the fetal heart rate  was not coming quite back up to baseline, hovering around the 80s or 90s.  The variability remained good. However, did call Dr. Emelda Fear into the room  with plans to extradite the delivery with vacuum assistance. A kiwi vacuum  was used, placed by myself, and traction was applied during a contraction.  The vacuum did pop off, so I handed it over to Dr. Emelda Fear. He placed the  vacuum and during the next contraction brought the baby down under the  symphysis pubis. It did pop off again, was replaced quickly, and at this  point, he handed the vacuum over to me. I completed the delivery of the head  quite easily over that same contraction. The vacuum was then discontinued,  the shoulders delivered easily with a little traction placed under the  posterior axilla. Time of delivery is 1330. There was a nuchal cord which  was reduced prior to delivery of the body. Apgars are 9 and 9. Weight is  pending but looks around 7 pounds or so. Twenty units of Pitocin diluted in  1,000 cc of lactated ringers was then infused rapidly IV. The placenta  separated spontaneously and was delivered via controlled cord traction and  maternal pushing effort at 1334. It was inspected and appears to be intact  with a three-vessel cord. The fundus was immediately firm. The  vagina was  inspected, and no lacerations were found. The epidural catheter was removed  with the blue tip visualized as being intact. Estimated blood loss 250 cc.      Meghan [name], C.N.M.      [name], M.D.  Electronically Signed    FC/MEDQ  D:  11/24/2004  T:  11/24/2004  Job:  [id number]   cc:   [facility]   Francoise Schaumann. Halford Chessman  Fax: [phone]

## 2010-06-05 NOTE — Op Note (Signed)
[name], [name]             ACCOUNT NO.:  [id number]   MEDICAL RECORD NO.:  [id number]          PATIENT TYPE:  AMB   LOCATION:  DAY                           FACILITY:  [facility]   PHYSICIAN:  [name], M.D. DATE OF BIRTH:  1970/11/19   DATE OF PROCEDURE:  06/18/2004  DATE OF DISCHARGE:                                 OPERATIVE REPORT   PREOPERATIVE DIAGNOSIS:  History of cervical incompetency, pregnancy 16  weeks' gestation.   POSTOPERATIVE DIAGNOSIS:  History of cervical incompetency, pregnancy 16  weeks' gestation.   PROCEDURE:  McDonald cerclage, single stitch.   SURGEON:  [name], M.D.   ASSISTANTAmie Critchley, CST.   ANESTHESIA:  Spinal caudal region only.   COMPLICATIONS:  None.   FINDINGS:  __soft long bulbous cervix.   INDICATIONS:  A 40 year old female, gravida 2, para 1 with a prior history  of McDonald cerclage for pregnancy in which falling of the endocervical  canal was noted early in pregnancy.   DETAILS OF PROCEDURE:  The patient was taken to the operating room. Spinal  block applied. The patient left in sitting position until only the sacral  dermatomes were anesthetized. She was then placed in the high lithotomy  position, prepped and draped, with bivalve Grave speculum inserted into the  vagina. Cervix could be grasped on its anterior lip with ring forceps and a  #1 Prolene suture placed in a purse-string suture around the cervix just at  its insertion into the vaginal walls. This was tied down at 11 o'clock,  which resulted in a distinct snugging of the cervix, and then it was trimmed  to allow approximately 1.5 cm tag of suture to be able to be accessed later.  Digital exam showed the endocervix was less than 1 cm dilated after the  procedure, and mucous plug was not disturbed. There was no suspicion of  membrane rupture, and estimated blood loss was minimal. Blood type has been  confirmed as Rh positive. The patient will go to the  recovery room and then  home with preterm labor symptoms reviewed with the patient.    JVF/MEDQ  D:  06/18/2004  T:  06/18/2004  Job:  [id number]

## 2010-06-05 NOTE — H&P (Signed)
[name], [name]             ACCOUNT NO.:  [id number]   MEDICAL RECORD NO.:  [id number]          PATIENT TYPE:  AMB   LOCATION:                                FACILITY:  [facility]   PHYSICIAN:  Lionel December, M.D.    DATE OF BIRTH:  [birth year]   DATE OF ADMISSION:  01/07/2006  DATE OF DISCHARGE:  LH                              HISTORY & PHYSICAL   CHIEF COMPLAINT:  Colitis, bloody diarrhea.   HISTORY OF PRESENT ILLNESS:  Meghan [name] is a 40 year old female who  presents back for followup today.  Since we saw her back in October of  this year, she has had another episode of bloody diarrhea.  This began  on December 29, 2005.  She has had several days of diarrhea and  eventually developed a bloody diarrhea.  She went to the emergency  department on January 02, 2006.  She was having nocturnal stools.  She  was seeing quite a significant amount of fresh blood per rectum.  She is  having significant abdominal pain, especially in the right lower  quadrant.  She had a CT of the abdomen and pelvis which was initially  read as no acute disease; however, Dr. Pia Mau performed delayed films  the following day with better opacification of the distal small bowel  and the colon.  At that point was found to have diffuse colonic wall  thickening, especially transverse and descending colon into the proximal  sigmoid colon.  She had mild pericolic infiltrative/inflammatory changes  noted at the descending colon,and transverse colon at the mid portion.  She had sparing of the rectum in the distal sigmoid colon.  Appendix  appeared to be normal.  Urinalysis was negative.  LFTs are normal except  for AST mildly elevated at 42.  CBC was unremarkable.  Stool culture was  negative.  Fecal lactoferrin was positive.  Her O&P was negative.  C.  diff was not done.  She denies any recent antibiotic use.  For the past  three days, her diarrhea has resolved.  She actually has not had a bowel  movement.  Her  abdominal pain has lessened but is still present in the  right lower quadrant region.  Denies any nausea or vomiting.  Overall,  she feels well.   CURRENT MEDICATIONS:  Dayquil p.r.n., Imodium p.r.n.   ALLERGIES:  No known drug allergies.   PAST MEDICAL HISTORY:  1. Obesity.  2. History of focal cecal colitis, discovered on colonoscopy by Dr.      Katrinka Blazing in May, 2005 when she presented with similar symptomatology.      She had some focal mild colitis.  Biopsies revealed mildly inflamed      focally intermittent mucosa with scattered lymphocyte aggregate but      no crypt abscesses or granulomas.   FAMILY HISTORY:  Negative for IBD, colorectal cancer.   SOCIAL HISTORY:  She is married and has two children, ages 71 and 1.  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  Does not smoke cigarettes or drink alcohol.  PHYSICAL EXAMINATION:  VITAL SIGNS:  Weight 249.  Height 5 feet 9.  Temp  98.5, blood pressure 122/80, pulse 80.  GENERAL:  A pleasant, moderately obese Caucasian female in no acute  distress.  SKIN:  Warm and dry.  No jaundice.  HEENT:  Conjunctivae are pink.  Sclerae are anicteric.  Oropharyngeal  mucosa moist and pink.  CHEST:  Lungs are clear to auscultation.  CARDIAC:  Regular rate and rhythm.  Normal S1 and S2.  No murmurs, rubs  or gallops.  ABDOMEN:  Positive bowel sounds.  Full but symmetrical.  The abdomen is  very soft.  She has mild right lower quadrant tenderness to deep  palpation.  No organomegaly or masses.  No rebound tenderness or  guarding.  No abdominal bruits.  EXTREMITIES:  No edema.   IMPRESSION:  [name] is a 40 year old lady with recent diarrhea with  noted hematochezia.  CT shows colitis in the transverse and descending  colon.  She has a history of cecal colitis 2-1/2 years ago.  Given that  she is having recurrent symptoms, really ought to consider diagnostic  colonoscopy at this time.  This is concerning for the  possibility of  inflammatory bowel disease.   RECOMMENDATIONS:  Colonoscopy in the near future.  Will discuss with Dr.  Karilyn Cota when he would like to proceed with this, now versus waiting until  current illness is resolving, in case this happens to be infectious  etiology, although I think this would be less likely.      Tana Coast, P.A.      Lionel December, M.D.  Electronically Signed    LL/MEDQ  D:  01/07/2006  T:  01/07/2006  Job:  [id number]   cc:   Catalina Pizza, M.D.  Fax: [phone]

## 2010-06-05 NOTE — H&P (Signed)
NAME:  Meghan [name], Meghan [name]                       ACCOUNT NO.:  [id number]   MEDICAL RECORD NO.:  [id number]                   PATIENT TYPE:  INP   LOCATION:  A318                                 FACILITY:  [facility]   PHYSICIAN:  Dirk Dress. Katrinka Blazing, M.D.                DATE OF BIRTH:  13-Jul-1970   DATE OF ADMISSION:  06/13/2003  DATE OF DISCHARGE:                                HISTORY & PHYSICAL   HISTORY:  A 40 year old female admitted via the emergency room for  evaluation of abdominal pain.  The patient gives a history of acute onset of  mid epigastric pain.  The pain has been present now for three days.  It  became constant the day of admission.  She denied nausea or vomiting.  She  rated the pain as a 10.  Because of severe discomfort, the patient came to  the emergency room for evaluation where she was noted to have mild right  lower quadrant tenderness.  A CT of the abdomen was done and is quite  confusing in that she is found to have some peri appendiceal  inflammation  which is felt to be compatible with either acute appendicitis with secondary  inflammation, cecal diverticulitis.  The appendix and terminal ileum could  not be adequately seen. She also has an associated ileus.  Because the  patient remains significantly symptomatic, she is going to be admitted.  It  is noted that she has serum potassium of 3.2 so she will need to have  potassium replacement.   PAST MEDICAL HISTORY:  She has no other medical illness.  She takes no  chronic medications.  She has no allergies.  She is employed as a Adult nurse.  She is married.  She does not drink, smoke or use drugs.   PHYSICAL EXAMINATION:  GENERAL:  She is lying quietly.  She does not appear  to be in acute distress.  VITAL SIGNS:  Blood pressure is 100/60, pulse 100, respirations 16,  temperature 98.4.  O2 saturation is 100%.  HEENT:  Unremarkable.  NECK:  Supple.  No jugular venous distention or bruit.  CHEST:  Clear.  HEART:  Regular  rate and rhythm without murmurs, gallops or rub.  ABDOMEN:  Nondistended and soft except for tenderness to palpation in the  right lower quadrant.  EXTREMITIES:  No clubbing, cyanosis or edema.  NEUROLOGIC:  No focal, motor, sensory or cerebellar deficit.   IMPRESSION:  Acute abdominal pain with inflammatory changes in the right  lower quadrant most consistent with appendicitis.   RECOMMENDATIONS:  The patient is admitted and is started on Cipro and Ancef  IV.  Should be given analgesics.  I will review her studies with the  radiologist this morning.  More than likely, she will have diagnostic  laparoscopy to rule out appendicitis later today.     ___________________________________________  Dirk Dress. Katrinka Blazing, M.D.   LCS/MEDQ  D:  06/13/2003  T:  06/13/2003  Job:  [id number]

## 2010-06-05 NOTE — H&P (Signed)
NAMEALONAH, LINEBACK             ACCOUNT NO.:  [id number]   MEDICAL RECORD NO.:  [id number]          PATIENT TYPE:  AMB   LOCATION:  DAY                           FACILITY:  [facility]   PHYSICIAN:  [name], M.D. DATE OF BIRTH:  1970-09-19   DATE OF ADMISSION:  06/18/2004  DATE OF DISCHARGE:  LH                                HISTORY & PHYSICAL   ADMITTING DIAGNOSIS:  History of cervical incompetency admitted for  prophylactic McDonald cerclage.   HISTORY OF PRESENT ILLNESS:  This 40 year old female gravida 2, para 1, AB  0, LMP February 19, 2004 placing menstrual [facility] November 25, 2004, she is 16  weeks 6 days by consensus criteria.  She has a history of funneling of the  cervix during her first pregnancy which was treated with McDonald cerclage.  Early in the pregnancy she had a partial previa which resolved and was  virtually resolved by May 20, 2004.  She has had no bleeding.  Due to this  reasonably delayed placement of cerclage until this date.  At this time she  has had a firm posterior cervix and is scheduled for McDonald cerclage.   PAST MEDICAL HISTORY:  Positive for abnormal Pap smear.   SURGICAL HISTORY:  McDonald cerclage last pregnancy removed at 36 weeks with  delivery at 38 weeks.  Prior cerclage was placed under spinal anesthesia.   SOCIAL HISTORY:  Married.  X-ray technician at [employer]   LABORATORIES:  Blood type is O positive.  Hemoglobin 13, hematocrit 41.  GC/Chlamydia negative.  Hepatitis/HIV/RPR all negative.  Pap smear class 1.   PHYSICAL EXAMINATION:  VITAL SIGNS:  Height 5 feet 9 inches, weight 230,  blood pressure 120/60.  GENERAL:  General exam shows a large framed overweight Caucasian female in  good overall health.  EYES:  Pupils are equal, round, reactive.  CARDIOVASCULAR EXAM:  Unremarkable.  ABDOMEN:  Nontender.  Fetal heart tones noted in the suprapubic area.  GU:  Uterus is anteflexed.  Cervix is closed, posterior, relatively  short  cervix, position high in the pelvis.   PLAN:  McDonald cerclage on June 18, 2004.      JVF/MEDQ  D:  06/17/2004  T:  06/17/2004  Job:  [id number]

## 2010-06-08 ENCOUNTER — Ambulatory Visit (HOSPITAL_COMMUNITY)
Admission: RE | Admit: 2010-06-08 | Discharge: 2010-06-08 | Disposition: A | Discharge status: Home / Self Care | Payer: Federal, State, Local not specified - PPO | Source: Ambulatory Visit | Attending: Internal Medicine | Admitting: Internal Medicine

## 2010-06-16 ENCOUNTER — Ambulatory Visit (HOSPITAL_COMMUNITY): Payer: Federal, State, Local not specified - PPO | Admitting: Physical Therapy

## 2010-06-18 ENCOUNTER — Ambulatory Visit (HOSPITAL_COMMUNITY)
Admission: RE | Admit: 2010-06-18 | Discharge: 2010-06-18 | Disposition: A | Discharge status: Home / Self Care | Payer: Federal, State, Local not specified - PPO | Source: Ambulatory Visit | Attending: Internal Medicine | Admitting: Internal Medicine

## 2010-06-22 ENCOUNTER — Ambulatory Visit (HOSPITAL_COMMUNITY)
Admission: RE | Admit: 2010-06-22 | Discharge: 2010-06-22 | Disposition: A | Discharge status: Home / Self Care | Payer: Federal, State, Local not specified - PPO | Source: Ambulatory Visit | Attending: Internal Medicine | Admitting: Internal Medicine

## 2010-06-22 DIAGNOSIS — M25559 Pain in unspecified hip: Secondary | ICD-10-CM | POA: Insufficient documentation

## 2010-06-22 DIAGNOSIS — M545 Low back pain, unspecified: Secondary | ICD-10-CM | POA: Insufficient documentation

## 2010-06-22 DIAGNOSIS — IMO0001 Reserved for inherently not codable concepts without codable children: Secondary | ICD-10-CM | POA: Insufficient documentation

## 2010-06-23 ENCOUNTER — Ambulatory Visit (HOSPITAL_COMMUNITY): Payer: Federal, State, Local not specified - PPO | Admitting: *Deleted

## 2010-06-25 ENCOUNTER — Ambulatory Visit (HOSPITAL_COMMUNITY): Payer: Federal, State, Local not specified - PPO | Admitting: Physical Therapy

## 2010-06-29 ENCOUNTER — Ambulatory Visit (HOSPITAL_COMMUNITY)
Admission: RE | Admit: 2010-06-29 | Discharge: 2010-06-29 | Disposition: A | Discharge status: Home / Self Care | Payer: Federal, State, Local not specified - PPO | Source: Ambulatory Visit | Attending: Internal Medicine | Admitting: Internal Medicine

## 2010-06-30 ENCOUNTER — Ambulatory Visit (HOSPITAL_COMMUNITY): Payer: Federal, State, Local not specified - PPO | Admitting: *Deleted

## 2010-07-02 ENCOUNTER — Ambulatory Visit (HOSPITAL_COMMUNITY): Payer: Federal, State, Local not specified - PPO | Admitting: Physical Therapy

## 2010-07-06 ENCOUNTER — Ambulatory Visit (HOSPITAL_COMMUNITY): Payer: Federal, State, Local not specified - PPO | Admitting: *Deleted

## 2010-07-07 ENCOUNTER — Ambulatory Visit (HOSPITAL_COMMUNITY): Payer: Federal, State, Local not specified - PPO | Admitting: *Deleted

## 2010-07-09 ENCOUNTER — Ambulatory Visit (HOSPITAL_COMMUNITY): Payer: Federal, State, Local not specified - PPO | Admitting: Physical Therapy

## 2010-08-05 ENCOUNTER — Ambulatory Visit: Payer: Federal, State, Local not specified - PPO | Admitting: Gastroenterology

## 2010-08-19 ENCOUNTER — Ambulatory Visit (INDEPENDENT_AMBULATORY_CARE_PROVIDER_SITE_OTHER): Payer: Federal, State, Local not specified - PPO | Admitting: Gastroenterology

## 2010-08-19 ENCOUNTER — Encounter: Payer: Self-pay | Admitting: Gastroenterology

## 2010-08-19 VITALS — BP 113/69 | HR 82 | Temp 98.0°F | Ht 69.0 in | Wt 257.2 lb

## 2010-08-19 DIAGNOSIS — R229 Localized swelling, mass and lump, unspecified: Secondary | ICD-10-CM

## 2010-08-19 DIAGNOSIS — M542 Cervicalgia: Secondary | ICD-10-CM

## 2010-08-19 DIAGNOSIS — R2230 Localized swelling, mass and lump, unspecified upper limb: Secondary | ICD-10-CM

## 2010-08-19 DIAGNOSIS — R590 Localized enlarged lymph nodes: Secondary | ICD-10-CM

## 2010-08-19 DIAGNOSIS — K219 Gastro-esophageal reflux disease without esophagitis: Secondary | ICD-10-CM

## 2010-08-19 DIAGNOSIS — R599 Enlarged lymph nodes, unspecified: Secondary | ICD-10-CM

## 2010-08-19 DIAGNOSIS — R109 Unspecified abdominal pain: Secondary | ICD-10-CM

## 2010-08-19 DIAGNOSIS — M79605 Pain in left leg: Secondary | ICD-10-CM

## 2010-08-19 DIAGNOSIS — R209 Unspecified disturbances of skin sensation: Secondary | ICD-10-CM

## 2010-08-19 DIAGNOSIS — G8929 Other chronic pain: Secondary | ICD-10-CM | POA: Insufficient documentation

## 2010-08-19 DIAGNOSIS — R200 Anesthesia of skin: Secondary | ICD-10-CM

## 2010-08-19 DIAGNOSIS — R202 Paresthesia of skin: Secondary | ICD-10-CM | POA: Insufficient documentation

## 2010-08-19 DIAGNOSIS — M545 Low back pain, unspecified: Secondary | ICD-10-CM

## 2010-08-19 DIAGNOSIS — K589 Irritable bowel syndrome without diarrhea: Secondary | ICD-10-CM

## 2010-08-19 MED ORDER — HYOSCYAMINE SULFATE ER 0.375 MG PO TB12: 0.3750 mg | ORAL_TABLET | Freq: Two times a day (BID) | ORAL | Status: DC | PRN

## 2010-08-19 MED ORDER — DEXLANSOPRAZOLE 60 MG PO CPDR: 60.0000 mg | DELAYED_RELEASE_CAPSULE | Freq: Every day | ORAL | Status: DC

## 2010-08-19 NOTE — Progress Notes (Signed)
Primary Care Physician:  Dwana Melena, MD  Primary Gastroenterologist:  Roetta Sessions, MD   Chief Complaint  Patient presents with  . Abdominal Pain    HPI:  Meghan [name] is a 40 y.o. female here for further evaluation of abdominal pain as well as other non-GI concerns. She states she seen multiple physicians in the last one year due to ongoing issues. Dr. Dwana Melena apparently found some abnormal labs and referred her to see Dr. Dareen Piano, rheumatologist. According to the patient there were no concerns for any autoimmune disorders. She was concerned because her mother recently was diagnosed with crest syndrome. Patient complains of lymphadenopathy involving the left posterior neck, the right upper extremity. She describes having headaches, numbness in the second and third toe of both feet, left hip pain, back pain. She states she was having Raynaud's symptoms, but this seems to have resolve. According to the patient, Dr. Dareen Piano felt much of her symptoms may be due to phentermine. She was on the medication over one year to facilitate weight loss. She stopped it in February at the recommendations of her Dr. Dareen Piano. With phentermine she lost 50 pounds but states she gained 30 pounds back since February. She has also been seeing Dr. Hilda Lias for left hip and back pain. States she has been on Cymbalta for joint pain but didn't seem to help so she stopped it. Given Lexapro for headaches but stopped it as well because of lack of efficacy. She's been on Naprosyn for her joint and hip pain. States she also saw Dr. wall to reevaluate for any cardiac issues.    Complains of recent episode of PP band-like abdominal pain. Lasted for four days. Could not have a bowel movement for 4 days after this happened. Felt like going to explode from umbilicus up into her chest. Only mild episodes since then. BP okay. She was not on Naprosyn yet when she had the symptoms. Complains of chronic insomnia, fatigue. Nausea, no  vomiting. Some heartburn. BM large or very small. Rectocele per Cyril Mourning, NP. No melena, brbpr. BM most days, take probiotics, mucinex. No hyomax lately.    Current Outpatient Prescriptions  Medication Sig Dispense Refill  . Bioflavonoid Products (BIOFLEX PO) Take by mouth.        . Multiple Vitamin (MULTIVITAMIN) capsule Take 1 capsule by mouth daily.        . Naproxen (NAPROSYN PO) Take by mouth.        . Probiotic Product (PHILLIPS COLON HEALTH PO) Take by mouth.        . dexlansoprazole (DEXILANT) 60 MG capsule Take 1 capsule (60 mg total) by mouth daily.  20 capsule  0  . hyoscyamine (LEVBID) 0.375 MG 12 hr tablet Take 1 tablet (0.375 mg total) by mouth every 12 (twelve) hours as needed for cramping.  60 tablet  0  . phentermine (ADIPEX-P) 37.5 MG tablet Take 37.5 mg by mouth daily before breakfast.          Allergies as of 08/19/2010 - Review Complete 08/19/2010  Allergen Reaction Noted  . Oxycodone-acetaminophen      Past Medical History  Diagnosis Date  . PVC (premature ventricular contraction)   . Chest pain, unspecified   . Dyspnea   . Palpitations   . Obesity, unspecified   . Colitis     see psh for colonoscopy reports  . IBS (irritable bowel syndrome)   . Nephrolithiasis     Past Surgical History  Procedure Date  . Kidney  stone surgery   . Knee arthroscopy 2008    ACL repair  . Colonoscopy with ileoscopy 2008    Dr. Karilyn Cota. patch of coarse mucosa at TI, bx  neg.  Random colon bx showed minimal active focal colitis ?resolving infection. Felt not c/w IBD.  Marland Kitchen Colonoscopy 2005    Dr. Elpidio Anis. Colitis of cecum and ICV, bx nonspecific  . Esophagogastroduodenoscopy 02/2008    small hh    Family History  Problem Relation Age of Onset  . Colon cancer Neg Hx     History   Social History  . Marital Status: Married    Spouse Name: N/A    Number of Children: 2  . Years of Education: N/A   Occupational History  . radiology     ct tech at [employer]    Social History Main Topics  . Smoking status: Never Smoker   . Smokeless tobacco: Not on file  . Alcohol Use: No  . Drug Use: No  . Sexually Active: Not on file   Other Topics Concern  . Not on file   Social History Narrative  . No narrative on file      ROS:  General: Negative for anorexia, unintentional weight loss, fever, chills.  Eyes: Negative for vision changes.  ENT: Negative for hoarseness, difficulty swallowing , nasal congestion. CV: Negative for chest pain, angina, palpitations, dyspnea on exertion, peripheral edema.  Respiratory: Negative for dyspnea at rest, dyspnea on exertion, cough, sputum, wheezing.  GI: See history of present illness. GU:  Negative for dysuria, hematuria, urinary incontinence, urinary frequency, nocturnal urination.  MS: C/O left hip pain, low back pain.  Derm: Negative for rash or itching.  Neuro: Negative for weakness, memory loss, confusion. C/O headaches in the left temporal area going down to the base of the neck on the left. C/O numbness of the 2nd and 3rd digits on both feet.  Psych: Negative for anxiety, depression, suicidal ideation, hallucinations.  Endo: Negative for unusual weight change. Lost 50 pounds on Adipex but gained 30 back since discontinuing the medication in 02/2010. Heme: Negative for bruising or bleeding. Allergy: Negative for rash or hives.   Physical Examination:  BP 113/69  Pulse 82  Temp(Src) 98 F (36.7 C) (Temporal)  Ht 5\' 9"  (1.753 m)  Wt 257 lb 3.2 oz (116.665 kg)  BMI 37.98 kg/m2   General: Well-nourished, well-developed in no acute distress.  Head: Normocephalic, atraumatic.   Eyes: Conjunctiva pink, no icterus. Mouth: Oropharyngeal mucosa moist and pink , no lesions erythema or exudate. Neck: Supple without thyromegaly, masses. Few palpable lymph nodes in left posterior cervical area.  Lungs: Clear to auscultation bilaterally.  Heart: Regular rate and rhythm, no murmurs rubs or gallops.   Abdomen: Bowel sounds are normal, mild RLQ tenderness, nondistended, no hepatosplenomegaly or masses, no abdominal bruits or hernia , no rebound or guarding.   Rectal: Not perfomed. Extremities: No lower extremity edema. No clubbing. Right upper extremity above the elbow is large than the left. No obvious mass.  Neuro: Alert and oriented x 4 , grossly normal neurologically.  Skin: Warm and dry, no rash or jaundice.   Psych: Alert and cooperative, normal mood and affect. Tearful at times.  Labs: Labs from Dr. Dareen Piano: 03/09/10: ESR 2, CRP 3.9, HLA-B27 negative.  CT A/P 12/2009: unremarkable.

## 2010-08-20 ENCOUNTER — Ambulatory Visit (HOSPITAL_COMMUNITY)
Admission: RE | Admit: 2010-08-20 | Discharge: 2010-08-20 | Disposition: A | Discharge status: Home / Self Care | Payer: Federal, State, Local not specified - PPO | Source: Ambulatory Visit | Attending: Gastroenterology | Admitting: Gastroenterology

## 2010-08-20 ENCOUNTER — Encounter: Payer: Self-pay | Admitting: Gastroenterology

## 2010-08-20 DIAGNOSIS — R209 Unspecified disturbances of skin sensation: Secondary | ICD-10-CM | POA: Insufficient documentation

## 2010-08-20 DIAGNOSIS — M545 Low back pain, unspecified: Secondary | ICD-10-CM | POA: Insufficient documentation

## 2010-08-20 DIAGNOSIS — M25559 Pain in unspecified hip: Secondary | ICD-10-CM | POA: Insufficient documentation

## 2010-08-20 DIAGNOSIS — M5124 Other intervertebral disc displacement, thoracic region: Secondary | ICD-10-CM | POA: Insufficient documentation

## 2010-08-20 DIAGNOSIS — R200 Anesthesia of skin: Secondary | ICD-10-CM

## 2010-08-20 DIAGNOSIS — G8929 Other chronic pain: Secondary | ICD-10-CM

## 2010-08-20 DIAGNOSIS — M542 Cervicalgia: Secondary | ICD-10-CM | POA: Insufficient documentation

## 2010-08-20 DIAGNOSIS — M79605 Pain in left leg: Secondary | ICD-10-CM

## 2010-08-20 DIAGNOSIS — M5126 Other intervertebral disc displacement, lumbar region: Secondary | ICD-10-CM | POA: Insufficient documentation

## 2010-08-21 ENCOUNTER — Ambulatory Visit (HOSPITAL_COMMUNITY)
Admission: RE | Admit: 2010-08-21 | Discharge: 2010-08-21 | Disposition: A | Discharge status: Home / Self Care | Payer: Federal, State, Local not specified - PPO | Source: Ambulatory Visit | Attending: Gastroenterology | Admitting: Gastroenterology

## 2010-08-21 DIAGNOSIS — R2230 Localized swelling, mass and lump, unspecified upper limb: Secondary | ICD-10-CM | POA: Insufficient documentation

## 2010-08-21 DIAGNOSIS — K219 Gastro-esophageal reflux disease without esophagitis: Secondary | ICD-10-CM | POA: Insufficient documentation

## 2010-08-21 DIAGNOSIS — R200 Anesthesia of skin: Secondary | ICD-10-CM

## 2010-08-21 DIAGNOSIS — M542 Cervicalgia: Secondary | ICD-10-CM

## 2010-08-21 DIAGNOSIS — R590 Localized enlarged lymph nodes: Secondary | ICD-10-CM | POA: Insufficient documentation

## 2010-08-21 DIAGNOSIS — R109 Unspecified abdominal pain: Secondary | ICD-10-CM | POA: Insufficient documentation

## 2010-08-21 DIAGNOSIS — M79605 Pain in left leg: Secondary | ICD-10-CM

## 2010-08-21 DIAGNOSIS — G8929 Other chronic pain: Secondary | ICD-10-CM

## 2010-08-21 DIAGNOSIS — M545 Low back pain: Secondary | ICD-10-CM

## 2010-08-21 NOTE — Assessment & Plan Note (Signed)
C/O neck pain, H/A, numbness of upper extremities. MRI cervical spine.

## 2010-08-21 NOTE — Assessment & Plan Note (Addendum)
Pain involving the lower back with abnormal numbness in 2nd/3rd toes on both feet. Left hip pain. Pain described as debilitating. MRI lumbar/thoracic spine.

## 2010-08-21 NOTE — Assessment & Plan Note (Addendum)
Recent bandlike abdominal pain, postprandial which lasted for about 3-4 days. Prior to episode she was having regular bowel movements but during the episode she developed constipation. Symptoms resolved she's had minor similar symptoms intermittently since. Etiology unclear. ?IBS/constipation. ?gb (no gallstones in 2010). GI history is complicated in the past with questionable colitis or inflammatory bowel disease but never enough information to diagnose. Previously responded to Hyomax. Will resume. Add PPI given she is on NSAIDS for joint/back pain. Also PPI for heartburn symptoms.   Obtain labs from PCP.  If no improvement, consider w/u of gb disease. Patient has concerns about possibility of chronic appendicitis. She always has some RLQ pain with palpation but otherwise no RLQ pain. To discuss case further with Dr. Jena Gauss.

## 2010-08-21 NOTE — Assessment & Plan Note (Signed)
RUE swelling above the elbow. Obvious difference in size of RUE compared to LUE. Could not appreciate any lymphadenopathy. Patient reports normal mammogram 2010. Discuss with Dr. Jena Gauss. Doubt blood clot.

## 2010-08-21 NOTE — Assessment & Plan Note (Signed)
Patient with left posterior cervical lymphadenopathy, chronic. Retrieve labs. Consider LN biopsy. To discuss with Dr. Jena Gauss.

## 2010-08-24 NOTE — Progress Notes (Signed)
Cc to PCP 

## 2010-08-25 ENCOUNTER — Ambulatory Visit (HOSPITAL_COMMUNITY): Payer: Federal, State, Local not specified - PPO

## 2010-08-26 ENCOUNTER — Ambulatory Visit (HOSPITAL_COMMUNITY)
Admission: RE | Admit: 2010-08-26 | Discharge: 2010-08-26 | Disposition: A | Discharge status: Home / Self Care | Payer: Federal, State, Local not specified - PPO | Source: Ambulatory Visit | Attending: Gastroenterology | Admitting: Gastroenterology

## 2010-08-26 DIAGNOSIS — M5124 Other intervertebral disc displacement, thoracic region: Secondary | ICD-10-CM | POA: Insufficient documentation

## 2010-08-26 DIAGNOSIS — R209 Unspecified disturbances of skin sensation: Secondary | ICD-10-CM | POA: Insufficient documentation

## 2010-08-26 DIAGNOSIS — M546 Pain in thoracic spine: Secondary | ICD-10-CM | POA: Insufficient documentation

## 2010-08-27 NOTE — Progress Notes (Signed)
AGREE-doubt chronic appendicitis. Most likely IBS-c. Discuss w/ RMR.

## 2010-09-02 ENCOUNTER — Telehealth: Payer: Self-pay | Admitting: Gastroenterology

## 2010-09-02 NOTE — Telephone Encounter (Signed)
Patient called an asked if you had forgotten about her??? She is still in pain, Please advise

## 2010-09-03 NOTE — Telephone Encounter (Signed)
Please schedule

## 2010-09-03 NOTE — Telephone Encounter (Signed)
Discussed with patient.  Please schedule her to see Dr. Fuller Canada. See copies of MRI cervical, thoracic, lumbar. Please schedule her to see Dr. Claud Kelp. For lymphadenapathy (left neck). LN biopsy. For RUE swelling.  Please schedule Abd u/s. For abd pain.  Patient requests late afternoon appointments but can do abd u/s anytime during day.

## 2010-09-03 NOTE — Telephone Encounter (Signed)
Pt is scheduled for abd u/s 09/04/2010@11 :00am. Pt is aware of appt.

## 2010-09-03 NOTE — Telephone Encounter (Addendum)
Please let patient know I reviewed everything with Dr. Jena Gauss. Plan as outlined below.  1. MRI cervical, thoracic, lumbar: no significant cervical disease, mid and lower thoracic spine disc and posterior element degeneration, changes are maximal at T10-11 with borderline to mild spinal stenosis. Increasing small central protrusion at L5-S1 with ?irritation of descending S1 nerve roots.  Per RMR, recommend sending MRI reports to Dr. Hilda Lias and have her f/u with him for suggestions.  2. For abdominal pain, RMR recommends abd u/s to look at gb again. He recommends NO NSAIDS. Recommends Dexilant as discussed at OV. 3. For left neck lymph nodes and right upper extremity swelling: RMR recommends sending to general surgery to consider lymph node biopsy. Let them decide what needed to evaluate RUE swelling.

## 2010-09-04 ENCOUNTER — Other Ambulatory Visit: Payer: Self-pay | Admitting: Obstetrics & Gynecology

## 2010-09-04 ENCOUNTER — Ambulatory Visit (HOSPITAL_COMMUNITY)
Admission: RE | Admit: 2010-09-04 | Discharge: 2010-09-04 | Disposition: A | Discharge status: Home / Self Care | Payer: Federal, State, Local not specified - PPO | Source: Ambulatory Visit | Attending: Gastroenterology | Admitting: Gastroenterology

## 2010-09-04 DIAGNOSIS — N926 Irregular menstruation, unspecified: Secondary | ICD-10-CM

## 2010-09-04 DIAGNOSIS — R109 Unspecified abdominal pain: Secondary | ICD-10-CM | POA: Insufficient documentation

## 2010-09-04 NOTE — Telephone Encounter (Signed)
Pt has an appt with Dr Derrell Lolling 09/23/2010@2 :30pm.  Waiting on respnse from T[facility] Coast about referral to Dr. Romeo Apple.  Per his office staff Dr Romeo Apple no longer manage back problems.

## 2010-09-08 ENCOUNTER — Ambulatory Visit (HOSPITAL_COMMUNITY)
Admission: RE | Admit: 2010-09-08 | Discharge: 2010-09-08 | Disposition: A | Discharge status: Home / Self Care | Payer: Federal, State, Local not specified - PPO | Source: Ambulatory Visit | Attending: Obstetrics & Gynecology | Admitting: Obstetrics & Gynecology

## 2010-09-08 ENCOUNTER — Ambulatory Visit (HOSPITAL_COMMUNITY): Payer: Federal, State, Local not specified - PPO

## 2010-09-08 DIAGNOSIS — D259 Leiomyoma of uterus, unspecified: Secondary | ICD-10-CM | POA: Insufficient documentation

## 2010-09-08 DIAGNOSIS — N938 Other specified abnormal uterine and vaginal bleeding: Secondary | ICD-10-CM | POA: Insufficient documentation

## 2010-09-08 DIAGNOSIS — N949 Unspecified condition associated with female genital organs and menstrual cycle: Secondary | ICD-10-CM | POA: Insufficient documentation

## 2010-09-08 DIAGNOSIS — N926 Irregular menstruation, unspecified: Secondary | ICD-10-CM

## 2010-09-09 ENCOUNTER — Other Ambulatory Visit (HOSPITAL_COMMUNITY): Payer: Federal, State, Local not specified - PPO

## 2010-09-09 NOTE — Telephone Encounter (Signed)
She did not want to go to Dr. Hilda Lias. Tell her I don't know anyone in [place] personally but we can send her to [facility] in [place]. Arrange if pt agreeable.

## 2010-09-10 NOTE — Telephone Encounter (Signed)
Routing to [employer] to make referral

## 2010-09-10 NOTE — Telephone Encounter (Signed)
Referral request along with office notes faxed to [facility], they will call us back with the appt time after reviewing pts records

## 2010-09-15 NOTE — Telephone Encounter (Signed)
Pt has an appt on 09/15/10 @ 10:45

## 2010-09-23 ENCOUNTER — Encounter (INDEPENDENT_AMBULATORY_CARE_PROVIDER_SITE_OTHER): Payer: Self-pay | Admitting: General Surgery

## 2010-09-23 ENCOUNTER — Ambulatory Visit (INDEPENDENT_AMBULATORY_CARE_PROVIDER_SITE_OTHER): Payer: Federal, State, Local not specified - PPO | Admitting: General Surgery

## 2010-09-23 VITALS — BP 130/90 | HR 84 | Temp 96.5°F | Ht 69.0 in | Wt 254.4 lb

## 2010-09-23 DIAGNOSIS — D1720 Benign lipomatous neoplasm of skin and subcutaneous tissue of unspecified limb: Secondary | ICD-10-CM

## 2010-09-23 DIAGNOSIS — D1779 Benign lipomatous neoplasm of other sites: Secondary | ICD-10-CM

## 2010-09-23 NOTE — Progress Notes (Signed)
Chief Complaint  Patient presents with  . Other    Right arm swelling/enlarged lymph node - Dr. Jena Gauss [phone]    HPI  Meghan [name] is a 40 y.o. female.    This patient was referred to me by Dr. Dwana Melena in [place] for evaluation of a subcutaneous mass of the right arm, palpable lymph nodes the left neck, and right arm enlargement.  The patient states that she noted her right arm to be larger than the left one year ago. She felt a lump in her right arm overlying the triceps muscle and she felt lymph nodes in her left neck approximately 8 months ago. Denies trauma or infection.  She saw Dr. Margo Aye approximately 8 months ago. She had lab work, CT scan, and so Dr. Dareen Piano for rheumatologic evaluation and was told there was no concern.  She saw Dr. Juanito Doom at [facility] cardiology on February 19, 2010 for atypical chest pain and was told it was stress.  She saw Tana Coast, P.A. at [facility] gastroenterology on August 3 and September 02, 2010 for right lower quadrant pain. She states the gallbladder ultrasound was normal.  She has seen someonet at [facility] and was told that she had L5-S1 disc and this was injected and this has helped her.  She is simply here today for evaluation of the right arm and left neck. HPI  Past Medical History  Diagnosis Date  . PVC (premature ventricular contraction)   . Chest pain, unspecified   . Dyspnea   . Palpitations   . Obesity, unspecified   . Colitis     see psh for colonoscopy reports  . IBS (irritable bowel syndrome)   . Nephrolithiasis   . Generalized headaches   . Enlarged lymph node     right side - arm  . Abdominal pain     RLQ  . Family history of ovarian cancer     MGM  . Nausea     Past Surgical History  Procedure Date  . Kidney stone surgery   . Knee arthroscopy 2008    ACL repair  . Colonoscopy with ileoscopy 2008    Dr. Karilyn Cota. patch of coarse mucosa at TI, bx  neg.  Random colon bx  showed minimal active focal colitis ?resolving infection. Felt not c/w IBD.  Marland Kitchen Colonoscopy 2005    Dr. Elpidio Anis. Colitis of cecum and ICV, bx nonspecific  . Esophagogastroduodenoscopy 02/2008    small hh    Family History  Problem Relation Age of Onset  . Colon cancer Neg Hx     Social History History  Substance Use Topics  . Smoking status: Never Smoker   . Smokeless tobacco: Never Used  . Alcohol Use: No    Allergies  Allergen Reactions  . Oxycodone-Acetaminophen Itching    REACTION: rash    Current Outpatient Prescriptions  Medication Sig Dispense Refill  . Bioflavonoid Products (BIOFLEX PO) Take by mouth.        Marland Kitchen BIOTIN PO Take by mouth 4 (four) times daily. Patient unaware of dosage.       . Multiple Vitamin (MULTIVITAMIN) capsule Take 1 capsule by mouth daily.        . hyoscyamine (LEVBID) 0.375 MG 12 hr tablet Take 1 tablet (0.375 mg total) by mouth every 12 (twelve) hours as needed for cramping.  60 tablet  0  . Naproxen (NAPROSYN PO) Take by mouth.        Marland Kitchen  phentermine (ADIPEX-P) 37.5 MG tablet Take 37.5 mg by mouth daily before breakfast.        . Probiotic Product (PHILLIPS COLON HEALTH PO) Take by mouth.          Review of Systems Review of Systems  Constitutional: Negative.   Eyes: Negative.   Respiratory: Negative.   Cardiovascular: Positive for chest pain. Negative for palpitations and leg swelling.  Gastrointestinal: Positive for nausea. Negative for vomiting, abdominal pain, diarrhea, constipation, blood in stool, abdominal distention, anal bleeding and rectal pain.  Genitourinary: Negative.   Musculoskeletal: Positive for myalgias and back pain. Negative for joint swelling, arthralgias and gait problem.  Skin: Negative.   Neurological: Positive for headaches. Negative for dizziness, tremors, seizures, syncope, facial asymmetry, speech difficulty, weakness, light-headedness and numbness.  Hematological: Positive for adenopathy.    Psychiatric/Behavioral: Negative.     Blood pressure 130/90, pulse 84, temperature 96.5 F (35.8 C), temperature source Temporal, height 5\' 9"  (1.753 m), weight 254 lb 6.4 oz (115.395 kg).  Physical Exam Physical Exam  Constitutional: She is oriented to person, place, and time. She appears well-nourished. No distress.       Obese.  HENT:  Head: Normocephalic and atraumatic.  Right Ear: External ear normal.  Left Ear: External ear normal.  Nose: Nose normal.  Mouth/Throat: No oropharyngeal exudate.       Lymph nodes in left neck, posterior triangle are not pathologically enlarged. Neck exam normal.  Eyes: Conjunctivae are normal. Pupils are equal, round, and reactive to light. No scleral icterus.  Neck: Normal range of motion. Neck supple. No JVD present. No tracheal deviation present. No thyromegaly present.  Cardiovascular: Normal rate, regular rhythm, normal heart sounds and intact distal pulses.   No murmur heard. Pulmonary/Chest: Breath sounds normal. No respiratory distress. She has no wheezes. She has no rales. She exhibits no tenderness.  Abdominal: Soft. Bowel sounds are normal. She exhibits no distension and no mass. There is no tenderness. There is no rebound and no guarding.       Obese. Striae.  Musculoskeletal:       RUE is larger than LUE, but tissues are soft, non tender, and non edematous. Suspect benign anatomic variant.  Lymphadenopathy:    She has no cervical adenopathy.  Neurological: She is alert and oriented to person, place, and time. She exhibits normal muscle tone.  Skin: Skin is warm and dry. No rash noted. No erythema. No pallor.  Psychiatric: She has a normal mood and affect. Her behavior is normal. Judgment and thought content normal.    Data Reviewed none  Assessment    No clinical evidence for pathologic adenopathy. Specifically no pathologic adenopathy of the neck.  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  Suspected  benign anatomic variant with right upper extremity being slightly larger than left. No clinical evidence for any edema state.  Obesity.    Plan    Patient is advised that there is no indication for lymph node biopsy of the neck. She is also advised that there is no indication for excision of a small lipoma of her right arm.  She was advised to return to Dr. Dwana Melena for further evaluation of her symptomatic complaints.  Return to see me p.r.n.       Tyion Boylen M 09/23/2010, 3:38 PM

## 2010-09-23 NOTE — Patient Instructions (Signed)
The lymph nodes in your left neck are not pathologically enlarged. They feel normal to me, and I do not advise a biopsy. The small nodule in the fatty tissue of your right arm feels like a benign lipoma. I do not advise that this area be excised. I do not know the reason for your right arm being larger than the left, but it does not appear to be a chronic edema state. Given your extensive workup I do not have anything further to add. I would return to Dr. Dwana Melena for further evaluation. Return to see me if any new problems arise.

## 2010-11-30 ENCOUNTER — Emergency Department (HOSPITAL_COMMUNITY)
Admission: EM | Admit: 2010-11-30 | Discharge: 2010-12-01 | Disposition: A | Discharge status: Home / Self Care | Payer: Federal, State, Local not specified - PPO | Attending: Emergency Medicine | Admitting: Emergency Medicine

## 2010-11-30 ENCOUNTER — Encounter (HOSPITAL_COMMUNITY): Payer: Self-pay | Admitting: *Deleted

## 2010-11-30 ENCOUNTER — Other Ambulatory Visit: Payer: Self-pay

## 2010-11-30 ENCOUNTER — Emergency Department (HOSPITAL_COMMUNITY): Payer: Federal, State, Local not specified - PPO

## 2010-11-30 DIAGNOSIS — I2510 Atherosclerotic heart disease of native coronary artery without angina pectoris: Secondary | ICD-10-CM | POA: Insufficient documentation

## 2010-11-30 DIAGNOSIS — Z7982 Long term (current) use of aspirin: Secondary | ICD-10-CM | POA: Insufficient documentation

## 2010-11-30 DIAGNOSIS — R079 Chest pain, unspecified: Secondary | ICD-10-CM | POA: Insufficient documentation

## 2010-11-30 DIAGNOSIS — Z86718 Personal history of other venous thrombosis and embolism: Secondary | ICD-10-CM | POA: Insufficient documentation

## 2010-11-30 DIAGNOSIS — K589 Irritable bowel syndrome without diarrhea: Secondary | ICD-10-CM | POA: Insufficient documentation

## 2010-11-30 LAB — BASIC METABOLIC PANEL
BUN: 18 mg/dL (ref 6–23)
CO2: 26 mEq/L (ref 19–32)
Chloride: 103 mEq/L (ref 96–112)
GFR calc Af Amer: >90 mL/min (ref >90)
Glucose, Bld: 110 mg/dL — ABNORMAL HIGH (ref 70–99)
Potassium: 4.3 mEq/L (ref 3.5–5.1)

## 2010-11-30 LAB — CBC
HCT: 39 % (ref 36.0–46.0)
Hemoglobin: 12.7 g/dL (ref 12.0–15.0)
RBC: 4.57 MIL/uL (ref 3.87–5.11)

## 2010-11-30 LAB — DIFFERENTIAL
Lymphs Abs: 2.3 10*3/uL (ref 0.7–4.0)
Monocytes Relative: 11 % (ref 3–12)
Neutro Abs: 4.1 10*3/uL (ref 1.7–7.7)
Neutrophils Relative %: 56 % (ref 43–77)

## 2010-11-30 MED ORDER — ASPIRIN 81 MG PO CHEW
324.0000 mg | CHEWABLE_TABLET | Freq: Once | ORAL | Status: AC
  Administered 2010-11-30: 324 mg via ORAL

## 2010-11-30 MED ORDER — ASPIRIN 81 MG PO CHEW
CHEWABLE_TABLET | ORAL | Status: AC
  Filled 2010-11-30: qty 4

## 2010-11-30 NOTE — ED Notes (Signed)
Chest pain, onset Friday, better after Aspirin.  No Sob, color good,

## 2010-11-30 NOTE — ED Provider Notes (Addendum)
History   This chart was scribed for Donnetta Hutching, MD by Clarita Crane. The patient was seen in room APA18/[facility]18 and the patient's care was started at 11:15PM.   CSN: [id number] Arrival date & time: 11/30/2010  9:01 PM   First MD Initiated Contact with Patient 11/30/10 2310      Chief Complaint  Patient presents with  . Chest Pain    (Consider location/radiation/quality/duration/timing/severity/associated sxs/prior treatment) HPI Meghan [name] is a 40 y.o. female who presents to the Emergency Department complaining of intermittent chest pain described as a dull ache which radiates to left shoulder and left jaw onset 1 week ago and persistent since with no associated symptoms. Patient states episodes of pain will last 1 minute and notes she will experience approximately 20 episodes per day. Notes pain is not aggravated with exertion. Denies SOB, diaphoresis, nausea. Patient with h/o colitis, hiatal hernia. Denies h/o hypertension, hyperlipidemia, diabetes and family h/o heart disease. Patient is a non smoker.  PCP- Margo Aye  Past Medical History  Diagnosis Date  . PVC (premature ventricular contraction)   . Chest pain, unspecified   . Dyspnea   . Palpitations   . Obesity, unspecified   . Colitis     see psh for colonoscopy reports  . IBS (irritable bowel syndrome)   . Nephrolithiasis   . Generalized headaches   . Enlarged lymph node     right side - arm  . Abdominal pain     RLQ  . Family history of ovarian cancer     MGM  . Nausea     Past Surgical History  Procedure Date  . Kidney stone surgery   . Knee arthroscopy 2008    ACL repair  . Colonoscopy with ileoscopy 2008    Dr. Karilyn Cota. patch of coarse mucosa at TI, bx  neg.  Random colon bx showed minimal active focal colitis ?resolving infection. Felt not c/w IBD.  Marland Kitchen Colonoscopy 2005    Dr. Elpidio Anis. Colitis of cecum and ICV, bx nonspecific  . Esophagogastroduodenoscopy 02/2008    small hh    Family History    Problem Relation Age of Onset  . Colon cancer Neg Hx     History  Substance Use Topics  . Smoking status: Never Smoker   . Smokeless tobacco: Never Used  . Alcohol Use: No    OB History    Grav Para Term Preterm Abortions TAB SAB Ect Mult Living                  Review of Systems 10 Systems reviewed and are negative for acute change except as noted in the HPI.  Allergies  Oxycodone-acetaminophen  Home Medications   Current Outpatient Rx  Name Route Sig Dispense Refill  . ASPIRIN 325 MG PO TABS Oral Take 650 mg by mouth daily as needed. For pain     . BIOFLEX PO Oral Take 1 tablet by mouth as needed. For knees/joints    . MULTIVITAMINS PO CAPS Oral Take 1 capsule by mouth daily.      Marland Kitchen PHILLIPS COLON HEALTH PO Oral Take 1 tablet by mouth as needed. For bowel      BP 134/65  Pulse 74  Temp(Src) 98.1 F (36.7 C) (Oral)  Resp 20  Ht 5\' 9"  (1.753 m)  Wt 254 lb (115.214 kg)  BMI 37.51 kg/m2  SpO2 95%  LMP 11/09/2010  Physical Exam  Nursing note and vitals reviewed. Constitutional: She is oriented to person, place,  and time. She appears well-developed and well-nourished. No distress.  HENT:  Head: Normocephalic and atraumatic.  Eyes: EOM are normal. Pupils are equal, round, and reactive to light.  Neck: Neck supple. No tracheal deviation present.  Cardiovascular: Normal rate and regular rhythm.   No murmur heard. Pulmonary/Chest: Effort normal. No respiratory distress.  Abdominal: She exhibits no distension.  Musculoskeletal: Normal range of motion. She exhibits no edema.  Neurological: She is alert and oriented to person, place, and time. No sensory deficit.  Skin: Skin is warm and dry.  Psychiatric: She has a normal mood and affect. Her behavior is normal.    ED Course  Procedures (including critical care time)  DIAGNOSTIC STUDIES: Oxygen Saturation is 97% on room air, normal by my interpretation.    COORDINATION OF CARE:     Labs Reviewed  BASIC  METABOLIC PANEL - Abnormal; Notable for the following:    Glucose, Bld 110 (*)    All other components within normal limits  CBC  DIFFERENTIAL  TROPONIN I   Dg Chest Port 1 View  11/30/2010  *RADIOLOGY REPORT*  Clinical Data: Chest pain radiating into the shoulder and jaw for 4 days.  PORTABLE CHEST - 1 VIEW  Comparison: Chest radiograph and CTA of the chest performed 03/08/2008  Findings: The lungs are well-aerated and clear.  There is no evidence of focal opacification, pleural effusion or pneumothorax. There is mild stable elevation of the left hemidiaphragm.  The cardiomediastinal silhouette is within normal limits.  No acute osseous abnormalities are seen.  IMPRESSION: No acute cardiopulmonary process seen.  Original Report Authenticated By: Tonia Ghent, M.D.   11/30/2010   No diagnosis found.  Results for orders placed during the hospital encounter of 11/30/10  CBC      Component Value Range   WBC 7.4  4.0 - 10.5 (K/uL)   RBC 4.57  3.87 - 5.11 (MIL/uL)   Hemoglobin 12.7  12.0 - 15.0 (g/dL)   HCT 16.1  09.6 - 04.5 (%)   MCV 85.3  78.0 - 100.0 (fL)   MCH 27.8  26.0 - 34.0 (pg)   MCHC 32.6  30.0 - 36.0 (g/dL)   RDW 40.9  81.1 - 91.4 (%)   Platelets 227  150 - 400 (K/uL)  DIFFERENTIAL      Component Value Range   Neutrophils Relative 56  43 - 77 (%)   Neutro Abs 4.1  1.7 - 7.7 (K/uL)   Lymphocytes Relative 31  12 - 46 (%)   Lymphs Abs 2.3  0.7 - 4.0 (K/uL)   Monocytes Relative 11  3 - 12 (%)   Monocytes Absolute 0.8  0.1 - 1.0 (K/uL)   Eosinophils Relative 2  0 - 5 (%)   Eosinophils Absolute 0.1  0.0 - 0.7 (K/uL)   Basophils Relative 0  0 - 1 (%)   Basophils Absolute 0.0  0.0 - 0.1 (K/uL)  BASIC METABOLIC PANEL      Component Value Range   Sodium 136  135 - 145 (mEq/L)   Potassium 4.3  3.5 - 5.1 (mEq/L)   Chloride 103  96 - 112 (mEq/L)   CO2 26  19 - 32 (mEq/L)   Glucose, Bld 110 (*) 70 - 99 (mg/dL)   BUN 18  6 - 23 (mg/dL)   Creatinine, Ser 7.82  0.50 - 1.10  (mg/dL)   Calcium 9.6  8.4 - 95.6 (mg/dL)   GFR calc non Af Amer >90  >90 (mL/min)   GFR  calc Af Amer >90  >90 (mL/min)  TROPONIN I      Component Value Range   Troponin I <0.30  <0.30 (ng/mL)    Date: 12/01/2010  Rate: 94  Rhythm: normal sinus rhythm  QRS Axis: normal  Intervals: normal  ST/T Wave abnormalities: normal  Conduction Disutrbances:none  Narrative Interpretation:   Old EKG Reviewed: none available  MDM   Patient has had intermittent brief chest pain for one week. Low risk for coronary artery disease and pulmonary embolus. Screening tests normal. Will followup with her doctor and cardiologist      I personally performed the services described in this documentation, which was scribed in my presence. The recorded information has been reviewed and considered.    Donnetta Hutching, MD 12/01/10 0110  Donnetta Hutching, MD 12/01/10 [phone]

## 2010-11-30 NOTE — ED Notes (Signed)
Sinus rhythm.  Alert, has sharp intermittent pain in chest.  Talking with friend, no distress.

## 2010-12-01 NOTE — ED Notes (Signed)
Sitting up in chair, sinus  Rhythm,  Dr Adriana Simas has been in to see pt.  No distress.

## 2011-09-17 ENCOUNTER — Telehealth: Payer: Self-pay | Admitting: Gastroenterology

## 2011-09-17 MED ORDER — HYOSCYAMINE SULFATE ER 0.375 MG PO TB12: 0.3750 mg | ORAL_TABLET | Freq: Two times a day (BID) | ORAL | Status: DC | PRN

## 2011-09-17 NOTE — Telephone Encounter (Signed)
Patient requesting refills on Levbid, takes prn only but works well.

## 2011-10-06 ENCOUNTER — Ambulatory Visit (INDEPENDENT_AMBULATORY_CARE_PROVIDER_SITE_OTHER): Payer: Federal, State, Local not specified - PPO | Admitting: Gastroenterology

## 2011-10-06 ENCOUNTER — Encounter: Payer: Self-pay | Admitting: Gastroenterology

## 2011-10-06 VITALS — BP 113/67 | HR 71 | Temp 97.9°F | Ht 69.0 in | Wt 266.2 lb

## 2011-10-06 DIAGNOSIS — K219 Gastro-esophageal reflux disease without esophagitis: Secondary | ICD-10-CM

## 2011-10-06 DIAGNOSIS — R1010 Upper abdominal pain, unspecified: Secondary | ICD-10-CM

## 2011-10-06 DIAGNOSIS — R109 Unspecified abdominal pain: Secondary | ICD-10-CM

## 2011-10-06 DIAGNOSIS — R110 Nausea: Secondary | ICD-10-CM

## 2011-10-06 NOTE — Progress Notes (Signed)
Primary Care Physician: Dwana Melena, MD  Primary Gastroenterologist:  Roetta Sessions, MD   Chief Complaint  Patient presents with  . Abdominal Pain    HPI: Meghan [name] is a 41 y.o. female here for f/u abdominal pain and nausea. She has h/o IBS, atypical chest pain due to GERD. Prior TCS in 2005 by Dr. Elpidio Anis, colitis of cecum and ICV, bx nonspecific. TCS with ileoscopy in 01/2006 by Dr. Karilyn Cota showed patch of coarse mucosa at TI, bx negative. Random colon bx showed minimal active focal colitis ?resolving infection. Felt not to be c/w IBD. EGD 2010, small hh. She has h/o chronic RLQ pain as well. Abnormal CT in 06/13/2003 showed cecal inflammation. 06/13/2003 delayed imaging showed ?crohn's of TI and cecum, and proximal ascending colon. CT 07/2003 unremarkable. CT 12/2005 again showed diffuse colonic wall thickening at TRV, descending colon, proximal sigmoid colon c/w colitis. CT 12/2009 negative.  Patient complains of three week history of flare of GI symptoms. Nausea when stomach is empty. No vomiting.  Pp upper abd pain almost with every meal. Eats supper at 7:30, goes to bed at 10pm. 3-4 times per week, wake up with pain. Weaning off pristiq for headache, has been on for six months. BM every day little bit of stool but not hard. No melena, brbpr. Every meal has pp pain. Levbid doesn't take pain away.   Spinal injections last year, no longer has back pain.   Current Outpatient Prescriptions  Medication Sig Dispense Refill  . ALPRAZolam (XANAX) 0.5 MG tablet Take 0.5 mg by mouth at bedtime as needed.       Marland Kitchen aspirin 325 MG tablet Take 650 mg by mouth daily as needed. For pain       . Bioflavonoid Products (BIOFLEX PO) Take 1 tablet by mouth as needed. For knees/joints      . hyoscyamine (LEVBID) 0.375 MG 12 hr tablet Take 1 tablet (0.375 mg total) by mouth every 12 (twelve) hours as needed for cramping.  60 tablet  11  . Multiple Vitamin (MULTIVITAMIN) capsule Take 1 capsule by mouth  daily.        Marland Kitchen omeprazole (PRILOSEC) 20 MG capsule Take 20 mg by mouth as needed.      . Probiotic Product (PHILLIPS COLON HEALTH PO) Take 1 tablet by mouth as needed. For bowel        Allergies as of 10/06/2011 - Review Complete 10/06/2011  Allergen Reaction Noted  . Oxycodone-acetaminophen Itching    Past Medical History  Diagnosis Date  . PVC (premature ventricular contraction)   . Chest pain, unspecified   . Dyspnea   . Palpitations   . Obesity, unspecified   . Colitis     see psh for colonoscopy reports  . IBS (irritable bowel syndrome)   . Nephrolithiasis   . Generalized headaches   . Enlarged lymph node     right side - arm  . Abdominal pain     RLQ  . Family history of ovarian cancer     MGM  . Nausea    Past Surgical History  Procedure Date  . Kidney stone surgery   . Knee arthroscopy 2008    ACL repair  . Colonoscopy with ileoscopy 2008    Dr. Karilyn Cota. patch of coarse mucosa at TI, bx  neg.  Random colon bx showed minimal active focal colitis ?resolving infection. Felt not c/w IBD.  Marland Kitchen Colonoscopy 2005    Dr. Elpidio Anis. Colitis of cecum and ICV, bx  nonspecific  . Esophagogastroduodenoscopy 02/2008    small hh    ROS:  General: Negative for anorexia, weight loss, fever, chills, fatigue, weakness. ENT: Negative for hoarseness, difficulty swallowing , nasal congestion. CV: Negative for chest pain, angina, palpitations, dyspnea on exertion, peripheral edema.  Respiratory: Negative for dyspnea at rest, dyspnea on exertion, cough, sputum, wheezing.  GI: See history of present illness. GU:  Negative for dysuria, hematuria, urinary incontinence, urinary frequency, nocturnal urination.  Endo: Negative for unusual weight change.    Physical Examination:   BP 113/67  Pulse 71  Temp 97.9 F (36.6 C) (Temporal)  Ht 5\' 9"  (1.753 m)  Wt 266 lb 3.2 oz (120.748 kg)  BMI 39.31 kg/m2  LMP 10/06/2011  General: Well-nourished, well-developed in no acute distress.    Eyes: No icterus. Mouth: Oropharyngeal mucosa moist and pink , no lesions erythema or exudate. Lungs: Clear to auscultation bilaterally.  Heart: Regular rate and rhythm, no murmurs rubs or gallops.  Abdomen: Bowel sounds are normal, mild RLQ tenderness, chronic, nondistended, no hepatosplenomegaly or masses, no abdominal bruits or hernia , no rebound or guarding.   Extremities: No lower extremity edema. No clubbing or deformities. Neuro: Alert and oriented x 4   Skin: Warm and dry, no jaundice.   Psych: Alert and cooperative, normal mood and affect.

## 2011-10-06 NOTE — Patient Instructions (Addendum)
Please take omeprazole 20mg  daily before breakfast. Have your lab work done. Let me know how you are doing in a couple of weeks. If still having problems, then we can arrange for a HIDA scan. Avoid fatty/greasy foods.

## 2011-10-06 NOTE — Assessment & Plan Note (Signed)
Recurrent upper abdominal pain, postprandial. Nocturnal symptoms. Nausea with empty stomach. No typical GERD symptoms. No gb stones last year. ?biliary vs atypical GERD vs dyspepsia. Check labs. Restart omeprazole 20mg  daily for next two weeks. Call with PR report. If persistent symptoms consider HIDA as next step.

## 2011-10-07 LAB — CBC WITH DIFFERENTIAL/PLATELET
Basophils Absolute: 0 10*3/uL (ref 0.0–0.1)
HCT: 38.4 % (ref 36.0–46.0)
Hemoglobin: 13.3 g/dL (ref 12.0–15.0)
Lymphocytes Relative: 32 % (ref 12–46)
Monocytes Absolute: 0.6 10*3/uL (ref 0.1–1.0)
Neutro Abs: 4.1 10*3/uL (ref 1.7–7.7)
RBC: 4.69 MIL/uL (ref 3.87–5.11)
RDW: 13.7 % (ref 11.5–15.5)
WBC: 7.1 10*3/uL (ref 4.0–10.5)

## 2011-10-07 NOTE — Progress Notes (Signed)
Faxed to PCP

## 2011-10-08 LAB — HEPATIC FUNCTION PANEL
ALT: 14 U/L (ref 0–35)
Bilirubin, Direct: 0.1 mg/dL (ref 0.0–0.3)
Indirect Bilirubin: 0.1 mg/dL (ref 0.0–0.9)

## 2011-10-08 LAB — LIPASE: Lipase: 61 U/L (ref 0–75)

## 2011-10-08 NOTE — Progress Notes (Signed)
Quick Note:  Please let pt know her labs are normal. This includes liver, pancreas, white blood cells, H/H.  PR 2 weeks as planned. ______

## 2011-10-08 NOTE — Progress Notes (Signed)
Quick Note:  Called pt at work number and she is aware of results. ______

## 2011-10-18 ENCOUNTER — Other Ambulatory Visit: Payer: Self-pay | Admitting: Gastroenterology

## 2011-10-18 ENCOUNTER — Telehealth: Payer: Self-pay | Admitting: Gastroenterology

## 2011-10-18 DIAGNOSIS — R109 Unspecified abdominal pain: Secondary | ICD-10-CM

## 2011-10-18 NOTE — Telephone Encounter (Signed)
Patient is scheduled for Wednesday Oct 2nd at 2:00 and she is aware

## 2011-10-18 NOTE — Telephone Encounter (Signed)
Patient sent staff message stating omeprazole and levbid not helping abd pain.  Please proceed with HIDA and CCK or fatty meal challenge.

## 2011-10-20 ENCOUNTER — Ambulatory Visit (HOSPITAL_COMMUNITY)
Admission: RE | Admit: 2011-10-20 | Discharge: 2011-10-20 | Disposition: A | Discharge status: Home / Self Care | Payer: Federal, State, Local not specified - PPO | Source: Ambulatory Visit | Attending: Gastroenterology | Admitting: Gastroenterology

## 2011-10-20 ENCOUNTER — Encounter (HOSPITAL_COMMUNITY): Payer: Self-pay

## 2011-10-20 DIAGNOSIS — R110 Nausea: Secondary | ICD-10-CM | POA: Insufficient documentation

## 2011-10-20 DIAGNOSIS — R109 Unspecified abdominal pain: Secondary | ICD-10-CM | POA: Insufficient documentation

## 2011-10-20 MED ORDER — TECHNETIUM TC 99M MEBROFENIN IV KIT
5.0000 | PACK | Freq: Once | INTRAVENOUS | Status: AC | PRN
  Administered 2011-10-20: 5 via INTRAVENOUS

## 2011-10-21 NOTE — Progress Notes (Signed)
Quick Note:  Please let patient know her HIDA is negative. I would recommend, if not having good BMs, to try Linzess daily before breakfast (can give #14 samples). This would be to treat IBS as well.  I would like for her to make appt with Dr. Jena Gauss for f/u. ______

## 2011-10-24 ENCOUNTER — Emergency Department (HOSPITAL_COMMUNITY)
Admission: EM | Admit: 2011-10-24 | Discharge: 2011-10-24 | Disposition: A | Discharge status: Home / Self Care | Payer: Federal, State, Local not specified - PPO | Attending: Emergency Medicine | Admitting: Emergency Medicine

## 2011-10-24 ENCOUNTER — Telehealth: Payer: Self-pay | Admitting: Internal Medicine

## 2011-10-24 ENCOUNTER — Encounter (HOSPITAL_COMMUNITY): Payer: Self-pay

## 2011-10-24 ENCOUNTER — Other Ambulatory Visit: Payer: Self-pay

## 2011-10-24 DIAGNOSIS — R110 Nausea: Secondary | ICD-10-CM | POA: Insufficient documentation

## 2011-10-24 DIAGNOSIS — E669 Obesity, unspecified: Secondary | ICD-10-CM | POA: Insufficient documentation

## 2011-10-24 DIAGNOSIS — R1033 Periumbilical pain: Secondary | ICD-10-CM | POA: Insufficient documentation

## 2011-10-24 DIAGNOSIS — R109 Unspecified abdominal pain: Secondary | ICD-10-CM

## 2011-10-24 DIAGNOSIS — K589 Irritable bowel syndrome without diarrhea: Secondary | ICD-10-CM | POA: Insufficient documentation

## 2011-10-24 DIAGNOSIS — Z79899 Other long term (current) drug therapy: Secondary | ICD-10-CM | POA: Insufficient documentation

## 2011-10-24 LAB — CBC WITH DIFFERENTIAL/PLATELET
Eosinophils Relative: 2 % (ref 0–5)
HCT: 40.4 % (ref 36.0–46.0)
Hemoglobin: 13.5 g/dL (ref 12.0–15.0)
Lymphocytes Relative: 31 % (ref 12–46)
Lymphs Abs: 1.9 10*3/uL (ref 0.7–4.0)
MCV: 83.8 fL (ref 78.0–100.0)
Monocytes Absolute: 0.5 10*3/uL (ref 0.1–1.0)
Monocytes Relative: 8 % (ref 3–12)
RBC: 4.82 MIL/uL (ref 3.87–5.11)
WBC: 6 10*3/uL (ref 4.0–10.5)

## 2011-10-24 LAB — COMPREHENSIVE METABOLIC PANEL
AST: 33 U/L (ref 0–37)
Alkaline Phosphatase: 69 U/L (ref 39–117)
BUN: 14 mg/dL (ref 6–23)
CO2: 24 mEq/L (ref 19–32)
Chloride: 102 mEq/L (ref 96–112)
Creatinine, Ser: 0.68 mg/dL (ref 0.50–1.10)
GFR calc non Af Amer: >90 mL/min (ref >90)
Total Bilirubin: 0.4 mg/dL (ref 0.3–1.2)

## 2011-10-24 LAB — URINALYSIS, ROUTINE W REFLEX MICROSCOPIC
Glucose, UA: NEGATIVE mg/dL
Hgb urine dipstick: NEGATIVE
Protein, ur: NEGATIVE mg/dL
Specific Gravity, Urine: 1.015 (ref 1.005–1.030)

## 2011-10-24 LAB — POCT PREGNANCY, URINE: Preg Test, Ur: NEGATIVE

## 2011-10-24 LAB — URINE MICROSCOPIC-ADD ON

## 2011-10-24 MED ORDER — SODIUM CHLORIDE 0.9 % IV SOLN
INTRAVENOUS | Status: DC
  Administered 2011-10-24: 10:00:00 via INTRAVENOUS

## 2011-10-24 MED ORDER — ONDANSETRON HCL 4 MG/2ML IJ SOLN
4.0000 mg | Freq: Once | INTRAMUSCULAR | Status: AC
  Administered 2011-10-24: 4 mg via INTRAVENOUS
  Filled 2011-10-24: qty 2

## 2011-10-24 MED ORDER — ONDANSETRON HCL 4 MG/2ML IJ SOLN: 4.0000 mg | Freq: Once | INTRAMUSCULAR | Status: DC

## 2011-10-24 MED ORDER — POLYETHYLENE GLYCOL 3350 17 GM/SCOOP PO POWD: 17.0000 g | Freq: Two times a day (BID) | ORAL | Status: DC

## 2011-10-24 MED ORDER — TRAMADOL HCL 50 MG PO TABS: 50.0000 mg | ORAL_TABLET | Freq: Four times a day (QID) | ORAL | Status: DC | PRN

## 2011-10-24 MED ORDER — MORPHINE SULFATE 4 MG/ML IJ SOLN
4.0000 mg | Freq: Once | INTRAMUSCULAR | Status: AC
  Administered 2011-10-24: 4 mg via INTRAVENOUS
  Filled 2011-10-24: qty 1

## 2011-10-24 NOTE — ED Provider Notes (Signed)
History     CSN: [id number]  Arrival date & time 10/24/11  3086   First MD Initiated Contact with Patient 10/24/11 0900      Chief Complaint  Patient presents with  . Abdominal Pain    (Consider location/radiation/quality/duration/timing/severity/associated sxs/prior treatment) HPI Comments: Meghan [name] presents with upper and middle abdominal pain and nausea which started 5 days ago.  She had a root canal 6 days ago, and was prescribed penicillin and hydrocodone.  The next day her abdominal pain and nausea began. She had constipation which has improved since stopping the hydrocodone, but her abdominal pain persists.  She describes cramping pain which radiates to her back.  She does have a history of IBS, but states this pain is longer lasting than prior IBS flares.  She recently had a workup for gallbladder disease including a negative HIDA scan.  Her last bowel movement was yesterday, small, but not hard and nonbloody.  Pertinent negatives include fever, vomiting, diarrhea.  She started taking Linzess yesterday for constipation predominant IBS.  The history is provided by the patient.    Past Medical History  Diagnosis Date  . PVC (premature ventricular contraction)   . Chest pain, unspecified   . Dyspnea   . Palpitations   . Obesity, unspecified   . Colitis     see psh for colonoscopy reports  . IBS (irritable bowel syndrome)   . Nephrolithiasis   . Generalized headaches   . Enlarged lymph node     right side - arm  . Abdominal pain     RLQ  . Family history of ovarian cancer     MGM  . Nausea     Past Surgical History  Procedure Date  . Kidney stone surgery   . Knee arthroscopy 2008    ACL repair  . Colonoscopy with ileoscopy 2008    Dr. Karilyn Cota. patch of coarse mucosa at TI, bx  neg.  Random colon bx showed minimal active focal colitis ?resolving infection. Felt not c/w IBD.  Marland Kitchen Colonoscopy 2005    Dr. Elpidio Anis. Colitis of cecum and ICV, bx nonspecific  .  Esophagogastroduodenoscopy 02/2008    small hh    Family History  Problem Relation Age of Onset  . Colon cancer Neg Hx     History  Substance Use Topics  . Smoking status: Never Smoker   . Smokeless tobacco: Never Used  . Alcohol Use: No    OB History    Grav Para Term Preterm Abortions TAB SAB Ect Mult Living                  Review of Systems  Constitutional: Negative for fever and chills.  HENT: Negative for congestion, sore throat and neck pain.   Eyes: Negative.   Respiratory: Negative for chest tightness and shortness of breath.   Cardiovascular: Negative for chest pain.  Gastrointestinal: Positive for nausea, abdominal pain and constipation. Negative for vomiting and diarrhea.  Genitourinary: Negative.   Musculoskeletal: Negative for joint swelling and arthralgias.  Skin: Negative.  Negative for rash and wound.  Neurological: Negative for dizziness, weakness, light-headedness, numbness and headaches.  Hematological: Negative.   Psychiatric/Behavioral: Negative.     Allergies  Oxycodone-acetaminophen  Home Medications   Current Outpatient Rx  Name Route Sig Dispense Refill  . ALPRAZOLAM 0.5 MG PO TABS Oral Take 0.5 mg by mouth at bedtime as needed.     . ASPIRIN 325 MG PO TABS Oral Take 650 mg by  mouth daily as needed. For pain     . BIOFLEX PO Oral Take 1 tablet by mouth as needed. For knees/joints    . HYOSCYAMINE SULFATE ER 0.375 MG PO TB12 Oral Take 1 tablet (0.375 mg total) by mouth every 12 (twelve) hours as needed for cramping. 60 tablet 11  . MULTIVITAMINS PO CAPS Oral Take 1 capsule by mouth daily.      Marland Kitchen OMEPRAZOLE 20 MG PO CPDR Oral Take 20 mg by mouth as needed.    Marland Kitchen POLYETHYLENE GLYCOL 3350 PO POWD Oral Take 17 g by mouth 2 (two) times daily. 527 g 0  . PHILLIPS COLON HEALTH PO Oral Take 1 tablet by mouth as needed. For bowel    . TRAMADOL HCL 50 MG PO TABS Oral Take 1 tablet (50 mg total) by mouth every 6 (six) hours as needed for pain. 15 tablet  0    BP 107/77  Temp 97.7 F (36.5 C) (Oral)  Resp 22  Ht 5\' 9"  (1.753 m)  Wt 260 lb (117.935 kg)  BMI 38.40 kg/m2  SpO2 100%  LMP 10/17/2011  Physical Exam  Nursing note and vitals reviewed. Constitutional: She appears well-developed and well-nourished.  HENT:  Head: Normocephalic and atraumatic.  Eyes: Conjunctivae normal are normal.  Neck: Normal range of motion.  Cardiovascular: Normal rate, regular rhythm, normal heart sounds and intact distal pulses.   Pulmonary/Chest: Effort normal and breath sounds normal. She has no wheezes.  Abdominal: Soft. Bowel sounds are normal. She exhibits no distension. There is no hepatosplenomegaly. There is tenderness in the epigastric area and periumbilical area. There is no rigidity, no rebound, no guarding and negative Murphy's sign.       No increased tympany.  Musculoskeletal: Normal range of motion.  Neurological: She is alert.  Skin: Skin is warm and dry.  Psychiatric: She has a normal mood and affect.    ED Course  Procedures (including critical care time)  Labs Reviewed  URINALYSIS, ROUTINE W REFLEX MICROSCOPIC - Abnormal; Notable for the following:    APPearance HAZY (*)     Leukocytes, UA TRACE (*)     All other components within normal limits  COMPREHENSIVE METABOLIC PANEL - Abnormal; Notable for the following:    Sodium 132 (*)     Albumin 3.4 (*)     All other components within normal limits  URINE MICROSCOPIC-ADD ON - Abnormal; Notable for the following:    Squamous Epithelial / LPF MANY (*)     Bacteria, UA FEW (*)     All other components within normal limits  CBC WITH DIFFERENTIAL  LIPASE, BLOOD  POCT PREGNANCY, URINE   No results found.   1. Abdominal pain       MDM  Patients labs and/or radiological studies were reviewed during the medical decision making and disposition process. Prior labs and studies including multiple normal abdominal/pelvis CT scans reviewed.  No significant findings on todays  labs or exam.  Suspect pain is functional.  Discussed with Dr. Jena Gauss.  Advised dc linzess, add miralax, continue taking levbid bid.  Will see in office in close f/u.  Also prescribed tramadol prn.     Date: 10/24/2011  Rate: 74   Rhythm: normal sinus rhythm  QRS Axis: normal  Intervals: normal  ST/T Wave abnormalities: normal  Conduction Disutrbances:none  Narrative Interpretation:   Old EKG Reviewed: none available      Burgess Amor, PA 10/24/11 2228

## 2011-10-24 NOTE — Telephone Encounter (Signed)
Meghan Amor, PA in the emergency department called me about this patient. She presented with periumbilical pain for 5 days duration. Ms. Meghan [name] states that Meghan [name] saw her in the office 3 days earlier. I do not see any such documentation. Patient recently exposed to penicillin and Vicodin (dental work) within the past week. Meghan [name] added to her regimen via a telephone encounter throughout office 4 days ago. Patient continues to have ongoing issues with constipation -  one small bowel movement reported today last one was 4 days ago. ED evaluation including physical examination and extensive blood work including pregnancy test demonstrated no cause of her symptoms. Cross-sectional imaging not performed. I recommended stop Meghan [name] and avoid oral narcotics. Continue probiotic utilize MiraLax and titrate to 1 capful twice daily to facilitate bowel function. Taking Meghan [name] sporadically. I recommended that she take it scheduled BID for now as scheduled medication. And would utilize non-narcotic, non-nonsteroidal analgesics. We'll arrange urgent followup in our office the first of the week.

## 2011-10-24 NOTE — ED Notes (Signed)
Pt reports mid ab pain, +nausea, no vomiting, no diarrhea or fever. Stated nothing helps the pain. Describes as cramping

## 2011-10-25 ENCOUNTER — Ambulatory Visit (INDEPENDENT_AMBULATORY_CARE_PROVIDER_SITE_OTHER): Payer: Federal, State, Local not specified - PPO | Admitting: Gastroenterology

## 2011-10-25 ENCOUNTER — Encounter: Payer: Self-pay | Admitting: Gastroenterology

## 2011-10-25 VITALS — BP 105/72 | HR 80 | Temp 96.3°F | Ht 69.0 in | Wt 270.2 lb

## 2011-10-25 DIAGNOSIS — K589 Irritable bowel syndrome without diarrhea: Secondary | ICD-10-CM

## 2011-10-25 DIAGNOSIS — R1010 Upper abdominal pain, unspecified: Secondary | ICD-10-CM

## 2011-10-25 DIAGNOSIS — R109 Unspecified abdominal pain: Secondary | ICD-10-CM

## 2011-10-25 MED ORDER — LINACLOTIDE 145 MCG PO CAPS: 145.0000 ug | ORAL_CAPSULE | Freq: Every day | ORAL | Status: DC

## 2011-10-25 NOTE — Progress Notes (Signed)
Primary Care Physician: Dwana Melena, MD  Primary Gastroenterologist:  Roetta Sessions, MD   Chief Complaint  Patient presents with  . ED follow up abdominal pain    HPI: Meghan [name] is a 41 y.o. female here for semi-urgent OV for f/u abdominal pain. She was last seen in office on 10/06/11 for c/o upper abdominal pain associated with nausea. Since her last OV, we restarted her omeprazole daily, which has not helped. Her LFTs, CBC, lipase were all normal. HIDA unremarkable. She had unofficial gb u/s which was negative. She had root canal last Tues, was given Penicillin/Vicodin. She took Vicodin every 4-6 hours for 3 doses. On Wed, her abdominal pain intensified and she could not have a BM. No good BM till Saturday after taking Linzess for first time. Within 30 mins she had a loose stool. She had couple of normal stools yesterday. Sunday morning she went to ED due to worsening of her upper abdominal pain. Severe nausea. No vomiting. Gnawing pain in epigastrium. Bad after goes to bed. Does not seem to be worse with meals. Miralax 17 BID recommended in ED but patient has not been able to start yet.    Current Outpatient Prescriptions  Medication Sig Dispense Refill  . ALPRAZolam (XANAX) 0.5 MG tablet Take 0.5 mg by mouth at bedtime as needed.       Marland Kitchen aspirin 325 MG tablet Take 650 mg by mouth daily as needed. For pain       . Bioflavonoid Products (BIOFLEX PO) Take 1 tablet by mouth as needed. For knees/joints      . hyoscyamine (LEVBID) 0.375 MG 12 hr tablet Take 1 tablet (0.375 mg total) by mouth every 12 (twelve) hours as needed for cramping.  60 tablet  11  . Multiple Vitamin (MULTIVITAMIN) capsule Take 1 capsule by mouth daily.        Marland Kitchen omeprazole (PRILOSEC) 20 MG capsule Take 20 mg by mouth as needed.      . polyethylene glycol powder (GLYCOLAX/MIRALAX) powder Take 17 g by mouth 2 (two) times daily.  527 g  0  . Probiotic Product (PHILLIPS COLON HEALTH PO) Take 1 tablet by mouth as needed. For  bowel       No current facility-administered medications for this visit.   Facility-Administered Medications Ordered in Other Visits  Medication Dose Route Frequency Provider Last Rate Last Dose  . DISCONTD: 0.9 %  sodium chloride infusion   Intravenous Continuous Burgess Amor, PA        Allergies as of 10/25/2011 - Review Complete 10/25/2011  Allergen Reaction Noted  . Oxycodone-acetaminophen Itching     ROS:  General: Negative for anorexia, weight loss, fever, chills, fatigue, weakness. ENT: Negative for hoarseness, difficulty swallowing , nasal congestion. CV: Negative for chest pain, angina, palpitations, dyspnea on exertion, peripheral edema.  Respiratory: Negative for dyspnea at rest, dyspnea on exertion, cough, sputum, wheezing.  GI: See history of present illness. GU:  Negative for dysuria, hematuria, urinary incontinence, urinary frequency, nocturnal urination.  Endo: Negative for unusual weight change.    Physical Examination:   BP 105/72  Pulse 80  Temp 96.3 F (35.7 C) (Tympanic)  Ht 5\' 9"  (1.753 m)  Wt 270 lb 3.2 oz (122.562 kg)  BMI 39.90 kg/m2  LMP 10/04/2011  General: Well-nourished, well-developed in no acute distress. Tearful. Eyes: No icterus. Mouth: Oropharyngeal mucosa moist and pink , no lesions erythema or exudate. Lungs: Clear to auscultation bilaterally.  Heart: Regular rate and rhythm, no murmurs  rubs or gallops.  Abdomen: Bowel sounds are normal, moderate epigastric tenderness, nondistended, no hepatosplenomegaly or masses, no abdominal bruits or hernia , no rebound or guarding.   Extremities: No lower extremity edema. No clubbing or deformities. Neuro: Alert and oriented x 4   Skin: Warm and dry, no jaundice.   Psych: Alert and cooperative, normal mood and affect.  Labs:  Lab Results  Component Value Date   WBC 6.0 10/24/2011   HGB 13.5 10/24/2011   HCT 40.4 10/24/2011   MCV 83.8 10/24/2011   PLT 223 10/24/2011   Lab Results  Component  Value Date   CREATININE 0.68 10/24/2011   BUN 14 10/24/2011   NA 132* 10/24/2011   K 3.8 10/24/2011   CL 102 10/24/2011   CO2 24 10/24/2011   Lab Results  Component Value Date   ALT 18 10/24/2011   AST 33 10/24/2011   ALKPHOS 69 10/24/2011   BILITOT 0.4 10/24/2011    Imaging Studies: Nm Hepato W/eject Fract  10/20/2011  *RADIOLOGY REPORT*  Clinical Data:  Abdominal pain, nausea.  NUCLEAR MEDICINE HEPATOBILIARY IMAGING WITH GALLBLADDER EF  Technique: Sequential images of the abdomen were obtained out to 60 minutes following intravenous administration of radiopharmaceutical.  After ingestion of a fatty meal, gallbladder ejection fraction was determined.  Radiopharmaceutical:  5.33mCi Tc-19m Choletec  Comparison: None  Findings: There is prompt uptake and excretion of radiotracer by the liver.  No evidence of cystic duct or common duct obstruction. Gallbladder ejection fraction is 59%.  Normal is greater than 33% with Ensure ingestion.  The patient did not experience symptoms following ingestion of fatty meal.  IMPRESSION: Unremarkable study.   Original Report Authenticated By: Cyndie Chime, M.D.

## 2011-10-25 NOTE — Telephone Encounter (Signed)
I called patient and she agreed to come today at 115 to see LSL

## 2011-10-25 NOTE — Patient Instructions (Addendum)
Please try Linzess once daily before breakfast. If you have recurrent abdominal pain (worse then usual), stop the medication and let me know. I will discuss further work-up and management with Dr. Jena Gauss. See dietary avoidance listed below.  Irritable Bowel Syndrome Irritable Bowel Syndrome (IBS) is caused by a disturbance of normal bowel function. Other terms used are spastic colon, mucous colitis, and irritable colon. It does not require surgery, nor does it lead to cancer. There is no cure for IBS. But with proper diet, stress reduction, and medication, you will find that your problems (symptoms) will gradually disappear or improve. IBS is a common digestive disorder. It usually appears in late adolescence or early adulthood. Women develop it twice as often as men. CAUSES  After food has been digested and absorbed in the small intestine, waste material is moved into the colon (large intestine). In the colon, water and salts are absorbed from the undigested products coming from the small intestine. The remaining residue, or fecal material, is held for elimination. Under normal circumstances, gentle, rhythmic contractions on the bowel walls push the fecal material along the colon towards the rectum. In IBS, however, these contractions are irregular and poorly coordinated. The fecal material is either retained too long, resulting in constipation, or expelled too soon, producing diarrhea. SYMPTOMS  The most common symptom of IBS is pain. It is typically in the lower left side of the belly (abdomen). But it may occur anywhere in the abdomen. It can be felt as heartburn, backache, or even as a dull pain in the arms or shoulders. The pain comes from excessive bowel-muscle spasms and from the buildup of gas and fecal material in the colon. This pain:  Can range from sharp belly (abdominal) cramps to a dull, continuous ache.  Usually worsens soon after eating.  Is typically relieved by having a bowel  movement or passing gas. Abdominal pain is usually accompanied by constipation. But it may also produce diarrhea. The diarrhea typically occurs right after a meal or upon arising in the morning. The stools are typically soft and watery. They are often flecked with secretions (mucus). Other symptoms of IBS include:  Bloating.  Loss of appetite.  Heartburn.  Feeling sick to your stomach (nausea).  Belching  Vomiting  Gas. IBS may also cause a number of symptoms that are unrelated to the digestive system:  Fatigue.  Headaches.  Anxiety  Shortness of breath  Difficulty in concentrating.  Dizziness. These symptoms tend to come and go. DIAGNOSIS  The symptoms of IBS closely mimic the symptoms of other, more serious digestive disorders. So your caregiver may wish to perform a variety of additional tests to exclude these disorders. He/she wants to be certain of learning what is wrong (diagnosis). The nature and purpose of each test will be explained to you. TREATMENT A number of medications are available to help correct bowel function and/or relieve bowel spasms and abdominal pain. Among the drugs available are:  Mild, non-irritating laxatives for severe constipation and to help restore normal bowel habits.  Specific anti-diarrheal medications to treat severe or prolonged diarrhea.  Anti-spasmodic agents to relieve intestinal cramps.  Your caregiver may also decide to treat you with a mild tranquilizer or sedative during unusually stressful periods in your life. The important thing to remember is that if any drug is prescribed for you, make sure that you take it exactly as directed. Make sure that your caregiver knows how well it worked for you. HOME CARE INSTRUCTIONS  Avoid foods that are high in fat or oils. Some examples WGN:FAOZH cream, butter, frankfurters, sausage, and other fatty meats.  Avoid foods that have a laxative effect, such as fruit, fruit juice, and dairy  products.  Cut out carbonated drinks, chewing gum, and "gassy" foods, such as beans and cabbage. This may help relieve bloating and belching.  Bran taken with plenty of liquids may help relieve constipation.  Keep track of what foods seem to trigger your symptoms.  Avoid emotionally charged situations or circumstances that produce anxiety.  Start or continue exercising.  Get plenty of rest and sleep. MAKE SURE YOU:   Understand these instructions.  Will watch your condition.  Will get help right away if you are not doing well or get worse. Document Released: 01/04/2005 Document Revised: 03/29/2011 Document Reviewed: 08/25/2007 [facility] Patient Information 2013 [place], [place].

## 2011-10-25 NOTE — Assessment & Plan Note (Signed)
Recurrent upper abdominal pain associated with nausea. Sometimes related to meals but not always. Worse after going to bed at night. No better with flatus or BM. No typical GERD symptoms. 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 GB u/s done last year normal. Recent HIDA unremarkable. Labs look good. On omeprazole daily for two weeks and takes probiotics all without improvement.   Discussed with patient today, this pain could be related to IBS, functional, due to constipation. Normal labs are reassuring. GB less likely the culprit given unremarkable work-up. I would like to try Linzess again at lower dose. Patient actually would prefer this over the Miralax. If she has worsening of her abdominal pain with subsequent doses, then we would stop it for good. I will discuss her case with Dr. Jena Gauss. ?need for EGD. Patient has had numerous CTs along the way and would like to avoid if not necessary. She agrees. Further recommendations to follow.

## 2011-10-25 NOTE — Progress Notes (Signed)
Faxed to PCP

## 2011-10-26 NOTE — Progress Notes (Signed)
Please let pt know the following. I would be glad to talk with her tomorrow if needed, just have her call me between 8 and 4.  Spoke with Dr. Jena Gauss regarding patient. He recommends switch PPI from omeprazole to Dexilant 60mg  daily to provide better acid suppression. May give samples. Continue other directions as provided at OV yesterday.  Also would advise EGD with plans for random biopsies if negative for eosinophilic esophagitis, SB biopsies for celiac, rule out H.pylori.   Please give Phenergan 25mg  IV 30 mins before the procedure.

## 2011-10-26 NOTE — Progress Notes (Signed)
Pt is aware and stated it was ok to set up egd. Leighann please see LSL note. dexilant samples at front desk.

## 2011-10-26 NOTE — ED Provider Notes (Signed)
Medical screening examination/treatment/procedure(s) were performed by non-physician practitioner and as supervising physician I was immediately available for consultation/collaboration.   Devi Hopman L Zanyah Lentsch, MD 10/26/11 1229 

## 2011-10-29 ENCOUNTER — Encounter: Payer: Self-pay | Admitting: General Practice

## 2011-10-29 NOTE — Progress Notes (Signed)
Pt is scheduled for 11/22/2011@7 :30am

## 2011-10-29 NOTE — Addendum Note (Signed)
Addended by: Jennings Books on: 10/29/2011 08:34 AM   Modules accepted: Orders

## 2011-11-10 ENCOUNTER — Encounter (HOSPITAL_COMMUNITY): Payer: Self-pay | Admitting: Pharmacy Technician

## 2011-11-18 ENCOUNTER — Other Ambulatory Visit: Payer: Self-pay | Admitting: Gastroenterology

## 2011-11-18 MED ORDER — DEXLANSOPRAZOLE 60 MG PO CPDR: 60.0000 mg | DELAYED_RELEASE_CAPSULE | Freq: Every day | ORAL | Status: DC

## 2011-11-18 NOTE — Progress Notes (Signed)
Patient requesting RX for Dexilant. States working well. Not taking Linzess anymore, did not like it.

## 2011-11-19 ENCOUNTER — Telehealth: Payer: Self-pay | Admitting: Internal Medicine

## 2011-11-19 ENCOUNTER — Ambulatory Visit: Payer: Federal, State, Local not specified - PPO | Admitting: Internal Medicine

## 2011-11-19 NOTE — Telephone Encounter (Signed)
Opened in error

## 2011-11-19 NOTE — Telephone Encounter (Signed)
Patient cancelled her EGD and she will call back at a later time  to reschedule she states that Dexilant is helping and will be by to pick up coupon

## 2011-11-22 ENCOUNTER — Ambulatory Visit (HOSPITAL_COMMUNITY)
Admission: RE | Admit: 2011-11-22 | Payer: Federal, State, Local not specified - PPO | Source: Ambulatory Visit | Admitting: Internal Medicine

## 2011-11-22 ENCOUNTER — Encounter (HOSPITAL_COMMUNITY): Admission: RE | Payer: Self-pay | Source: Ambulatory Visit

## 2011-11-22 SURGERY — EGD (ESOPHAGOGASTRODUODENOSCOPY)
Anesthesia: Moderate Sedation

## 2012-02-03 ENCOUNTER — Other Ambulatory Visit (HOSPITAL_COMMUNITY): Payer: Self-pay | Admitting: Internal Medicine

## 2012-02-03 ENCOUNTER — Ambulatory Visit (HOSPITAL_COMMUNITY)
Admission: RE | Admit: 2012-02-03 | Discharge: 2012-02-03 | Disposition: A | Discharge status: Home / Self Care | Payer: Federal, State, Local not specified - PPO | Source: Ambulatory Visit | Attending: Internal Medicine | Admitting: Internal Medicine

## 2012-02-03 ENCOUNTER — Encounter (HOSPITAL_COMMUNITY): Payer: Self-pay

## 2012-02-03 DIAGNOSIS — R109 Unspecified abdominal pain: Secondary | ICD-10-CM

## 2012-02-03 DIAGNOSIS — R1011 Right upper quadrant pain: Secondary | ICD-10-CM | POA: Insufficient documentation

## 2012-02-03 DIAGNOSIS — R1031 Right lower quadrant pain: Secondary | ICD-10-CM | POA: Insufficient documentation

## 2012-10-09 ENCOUNTER — Ambulatory Visit: Payer: Self-pay | Admitting: Adult Health

## 2012-10-11 ENCOUNTER — Ambulatory Visit: Payer: Self-pay | Admitting: Adult Health

## 2012-10-17 ENCOUNTER — Encounter: Payer: Self-pay | Admitting: Adult Health

## 2012-10-17 ENCOUNTER — Other Ambulatory Visit: Payer: Self-pay | Admitting: Adult Health

## 2012-10-17 ENCOUNTER — Ambulatory Visit (INDEPENDENT_AMBULATORY_CARE_PROVIDER_SITE_OTHER): Payer: Federal, State, Local not specified - PPO | Admitting: Adult Health

## 2012-10-17 VITALS — BP 108/80 | Ht 69.0 in | Wt 268.0 lb

## 2012-10-17 DIAGNOSIS — N920 Excessive and frequent menstruation with regular cycle: Secondary | ICD-10-CM

## 2012-10-17 HISTORY — DX: Excessive and frequent menstruation with regular cycle: N92.0

## 2012-10-17 NOTE — Progress Notes (Signed)
Subjective:     Patient ID: Meghan [name], female   DOB: [birth year], 42 y.o.   MRN: [id number]  HPI Meghan [name] is a 42 year old white female in complaining of bleeding heavier and clots,it is still regular and lasts same amount of days and she is having cramps.And she sees white debris in toilet when pees. Last pap 2011.  Review of Systems See HPI Reviewed past medical,surgical, social and family history. Reviewed medications and allergies.     Objective:   Physical Exam BP 108/80  Ht 5\' 9"  (1.753 m)  Wt 268 lb (121.564 kg)  BMI 39.56 kg/m2  LMP 09/28/2012   Skin warm and dry.Pelvic: external genitalia is normal in appearance, vagina: normal in appearance, cervix:smooth and bulbous, uterus: normal size, shape and contour, non tender, no masses felt, adnexa: no masses or tenderness noted. Will get [place] to assess uterus, and discussed ablation and medications to stop bleeding.  Assessment:     Menorrhagia    Plan:    Get pelvic [facility] at [facility] and call me after for results Schedule pap Review handout on endo ablation

## 2012-10-17 NOTE — Patient Instructions (Addendum)
Endometrial Ablation Endometrial ablation removes the lining of the uterus (endometrium). It is usually a same day, outpatient treatment. Ablation helps avoid major surgery (such as a hysterectomy). A hysterectomy is removal of the cervix and uterus. Endometrial ablation has less risk and complications, has a shorter recovery period and is less expensive. After endometrial ablation, most women will have little or no menstrual bleeding. You may not keep your fertility. Pregnancy is no longer likely after this procedure but if you are pre-menopausal, you still need to use a reliable method of birth control following the procedure because pregnancy can occur. REASONS TO HAVE THE PROCEDURE MAY INCLUDE:  Heavy periods.  Bleeding that is causing anemia.  Anovulatory bleeding, very irregular, bleeding.  Bleeding submucous fibroids (on the lining inside the uterus) if they are smaller than 3 centimeters. REASONS NOT TO HAVE THE PROCEDURE MAY INCLUDE:  You wish to have more children.  You have a pre-cancerous or cancerous problem. The cause of any abnormal bleeding must be diagnosed before having the procedure.  You have pain coming from the uterus.  You have a submucus fibroid larger than 3 centimeters.  You recently had a baby.  You recently had an infection in the uterus.  You have a severe retro-flexed, tipped uterus and cannot insert the instrument to do the ablation.  You had a Cesarean section or deep major surgery on the uterus.  The inner cavity of the uterus is too large for the endometrial ablation instrument. RISKS AND COMPLICATIONS   Perforation of the uterus.  Bleeding.  Infection of the uterus, bladder or vagina.  Injury to surrounding organs.  Cutting the cervix.  An air bubble to the lung (air embolus).  Pregnancy following the procedure.  Failure of the procedure to help the problem requiring hysterectomy.  Decreased ability to diagnose cancer in the lining of  the uterus. BEFORE THE PROCEDURE  The lining of the uterus must be tested to make sure there is no pre-cancerous or cancer cells present.  Medications may be given to make the lining of the uterus thinner.  Ultrasound may be used to evaluate the size and look for abnormalities of the uterus.  Future pregnancy is not desired. PROCEDURE  There are different ways to destroy the lining of the uterus.   Resectoscope - radio frequency-alternating electric current is the most common one used.  Cryotherapy - freezing the lining of the uterus.  Heated Free Liquid - heated salt (saline) solution inserted into the uterus.  Microwave - uses high energy microwaves in the uterus.  Thermal Balloon - a catheter with a balloon tip is inserted into the uterus and filled with heated fluid. Your caregiver will talk with you about the method used in this clinic. They will also instruct you on the pros and cons of the procedure. Endometrial ablation is performed along with a procedure called operative hysteroscopy. A narrow viewing tube is inserted through the birth canal (vagina) and through the cervix into the uterus. A tiny camera attached to the viewing tube (hysteroscope) allows the uterine cavity to be shown on a TV monitor during surgery. Your uterus is filled with a harmless liquid to make the procedure easier. The lining of the uterus is then removed. The lining can also be removed with a resectoscope which allows your surgeon to cut away the lining of the uterus under direct vision. Usually, you will be able to go home within an hour after the procedure. HOME CARE INSTRUCTIONS   Do   not drive for 24 hours.  No tampons, douching or intercourse for 2 weeks or until your caregiver approves.  Rest at home for 24 to 48 hours. You may then resume normal activities unless told differently by your caregiver.  Take your temperature two times a day for 4 days, and record it.  Take any medications your  caregiver has ordered, as directed.  Use some form of contraception if you are pre-menopausal and do not want to get pregnant. Bleeding after the procedure is normal. It varies from light spotting and mildly watery to bloody discharge for 4 to 6 weeks. You may also have mild cramping. Only take over-the-counter or prescription medicines for pain, discomfort, or fever as directed by your caregiver. Do not use aspirin, as this may aggravate bleeding. Frequent urination during the first 24 hours is normal. You will not know how effective your surgery is until at least 3 months after the surgery. SEEK IMMEDIATE MEDICAL CARE IF:   Bleeding is heavier than a normal menstrual cycle.  An oral temperature above 102 F (38.9 C) develops.  You have increasing cramps or pains not relieved with medication or develop belly (abdominal) pain which does not seem to be related to the same area of earlier cramping and pain.  You are light headed, weak or have fainting episodes.  You develop pain in the shoulder strap areas.  You have chest or leg pain.  You have abnormal vaginal discharge.  You have painful urination. Document Released: 11/14/2003 Document Revised: 03/29/2011 Document Reviewed: 02/11/2007 [facility] Patient Information 2014 [place], [place]. Get [place] and call me

## 2012-10-18 ENCOUNTER — Ambulatory Visit (HOSPITAL_COMMUNITY): Payer: Federal, State, Local not specified - PPO

## 2012-10-19 ENCOUNTER — Other Ambulatory Visit (HOSPITAL_COMMUNITY): Payer: Federal, State, Local not specified - PPO

## 2012-10-23 ENCOUNTER — Ambulatory Visit (HOSPITAL_COMMUNITY): Admission: RE | Admit: 2012-10-23 | Payer: Federal, State, Local not specified - PPO | Source: Ambulatory Visit

## 2012-10-23 ENCOUNTER — Ambulatory Visit (HOSPITAL_COMMUNITY): Payer: Federal, State, Local not specified - PPO

## 2012-10-25 ENCOUNTER — Other Ambulatory Visit (HOSPITAL_COMMUNITY): Payer: Self-pay | Admitting: Internal Medicine

## 2012-10-25 DIAGNOSIS — R52 Pain, unspecified: Secondary | ICD-10-CM

## 2012-10-27 ENCOUNTER — Other Ambulatory Visit: Payer: Federal, State, Local not specified - PPO | Admitting: Adult Health

## 2012-10-30 ENCOUNTER — Telehealth: Payer: Self-pay | Admitting: Adult Health

## 2012-10-30 ENCOUNTER — Ambulatory Visit (HOSPITAL_COMMUNITY)
Admission: RE | Admit: 2012-10-30 | Discharge: 2012-10-30 | Disposition: A | Discharge status: Home / Self Care | Payer: Federal, State, Local not specified - PPO | Source: Ambulatory Visit | Attending: Adult Health | Admitting: Adult Health

## 2012-10-30 DIAGNOSIS — R1031 Right lower quadrant pain: Secondary | ICD-10-CM | POA: Insufficient documentation

## 2012-10-30 DIAGNOSIS — N920 Excessive and frequent menstruation with regular cycle: Secondary | ICD-10-CM | POA: Insufficient documentation

## 2012-10-30 DIAGNOSIS — D252 Subserosal leiomyoma of uterus: Secondary | ICD-10-CM | POA: Insufficient documentation

## 2012-10-30 DIAGNOSIS — R1909 Other intra-abdominal and pelvic swelling, mass and lump: Secondary | ICD-10-CM | POA: Insufficient documentation

## 2012-10-30 NOTE — Telephone Encounter (Signed)
Pt aware of [place] and has appt in am will discuss more then.  Adline Potter, NP 10/30/2012 5:04 PM

## 2012-10-31 ENCOUNTER — Telehealth: Payer: Self-pay | Admitting: Adult Health

## 2012-10-31 NOTE — Telephone Encounter (Signed)
Has appt 23rd not today which is fine

## 2012-11-09 ENCOUNTER — Other Ambulatory Visit (HOSPITAL_COMMUNITY)
Admission: RE | Admit: 2012-11-09 | Discharge: 2012-11-09 | Disposition: A | Discharge status: Home / Self Care | Payer: Federal, State, Local not specified - PPO | Source: Ambulatory Visit | Attending: Adult Health | Admitting: Adult Health

## 2012-11-09 ENCOUNTER — Ambulatory Visit (INDEPENDENT_AMBULATORY_CARE_PROVIDER_SITE_OTHER): Payer: Federal, State, Local not specified - PPO | Admitting: Adult Health

## 2012-11-09 ENCOUNTER — Encounter: Payer: Self-pay | Admitting: Adult Health

## 2012-11-09 VITALS — BP 120/74 | HR 74 | Ht 69.0 in | Wt 268.0 lb

## 2012-11-09 DIAGNOSIS — Z01818 Encounter for other preprocedural examination: Secondary | ICD-10-CM

## 2012-11-09 DIAGNOSIS — N920 Excessive and frequent menstruation with regular cycle: Secondary | ICD-10-CM

## 2012-11-09 DIAGNOSIS — Z1151 Encounter for screening for human papillomavirus (HPV): Secondary | ICD-10-CM | POA: Insufficient documentation

## 2012-11-09 DIAGNOSIS — Z01419 Encounter for gynecological examination (general) (routine) without abnormal findings: Secondary | ICD-10-CM

## 2012-11-09 DIAGNOSIS — N840 Polyp of corpus uteri: Secondary | ICD-10-CM

## 2012-11-09 HISTORY — DX: Polyp of corpus uteri: N84.0

## 2012-11-09 NOTE — Progress Notes (Signed)
Patient ID: Meghan [name], female   DOB: 1970-05-27, 42 y.o.   MRN: [id number] History of Present Illness: Meghan [name] is a 42 year old white female married in for a pap and physical.She was recently seen for menorrhagia and had an [place], which showed an endometrial mass ? Polyp and she has appt 10/29 to see Dr Emelda Fear for possible endometrial biopsy.  Current Medications, Allergies, Past Medical History, Past Surgical History, Family History and Social History were reviewed in [employer] record.     Review of Systems: Patient denies any headaches, blurred vision, shortness of breath, chest pain, abdominal pain, problems with bowel movements, urination, or intercourse. No joint pain or mood swings. Positives in HPI.   Physical Exam:BP 120/74  Pulse 74  Ht 5\' 9"  (1.753 m)  Wt 268 lb (121.564 kg)  BMI 39.56 kg/m2  LMP 10/29/2012 General:  Well developed, well nourished, no acute distress Skin:  Warm and dry Neck:  Midline trachea, normal thyroid Lungs; Clear to auscultation bilaterally Breast:  No dominant palpable mass, retraction, or nipple discharge Cardiovascular: Regular rate and rhythm Abdomen:  Soft, non tender, no hepatosplenomegaly Pelvic:  External genitalia is normal in appearance.Has skin left groin, would like removed if has surgery.  The vagina is normal in appearance. The cervix is bulbous.Pap with HPV and a GC/CHL was obtained too.  Uterus is felt to be normal size, shape, and contour.  No   adnexal masses or tenderness noted. Extremities:  No swelling or varicosities noted Psych:  No mood changes, alert and cooperative Discussed hysteroscopy D&C and ablation, has handout on ablation already.   Impression: Yearly gyn exam Menorrhagia Endometrial Mass ?polyp    Plan: Physical in 1 year Mammogram at 42 Return as scheduled for ?endometrial biopsy

## 2012-11-09 NOTE — Addendum Note (Signed)
Addended by: Richardson Chiquito on: 11/09/2012 04:23 PM   Modules accepted: Orders

## 2012-11-09 NOTE — Patient Instructions (Signed)
Follow up as scheduled Mammogram at 42 Physical in 1 year

## 2012-11-10 LAB — GC/CHLAMYDIA PROBE AMP: CT Probe RNA: NEGATIVE

## 2012-11-15 ENCOUNTER — Encounter: Payer: Self-pay | Admitting: Obstetrics and Gynecology

## 2012-11-15 ENCOUNTER — Ambulatory Visit (INDEPENDENT_AMBULATORY_CARE_PROVIDER_SITE_OTHER): Payer: Federal, State, Local not specified - PPO | Admitting: Obstetrics and Gynecology

## 2012-11-15 VITALS — BP 130/82 | Ht 69.0 in | Wt 268.0 lb

## 2012-11-15 DIAGNOSIS — Z302 Encounter for sterilization: Secondary | ICD-10-CM

## 2012-11-15 DIAGNOSIS — Z3200 Encounter for pregnancy test, result unknown: Secondary | ICD-10-CM

## 2012-11-15 DIAGNOSIS — N920 Excessive and frequent menstruation with regular cycle: Secondary | ICD-10-CM

## 2012-11-15 MED ORDER — HYDROCOD POLST-CHLORPHEN POLST 10-8 MG/5ML PO LQCR: 5.0000 mL | Freq: Two times a day (BID) | ORAL | Status: DC | PRN

## 2012-11-15 NOTE — Addendum Note (Signed)
Addended by: [name] on: 11/15/2012 12:31 PM   Modules accepted: Orders

## 2012-11-15 NOTE — Progress Notes (Signed)
Patient ID: Meghan [name], female   DOB: Oct 05, 1970, 42 y.o.   MRN: [id number] Pt here today to discuss what the next step is.    [facility] Visit  Patient name: Meghan [name] MRN [id number]  Date of birth: 01/09/1971  CC & HPI:  Meghan [name] is a 42 y.o. female presenting today for decision making regarding endometrial polyp found on pelvic  after last weeks annual exam. Patient is then menstrual cycle week 3, on no contraception method, husband has had vasectomy Endometrial thickness is essentially normal other than the polyp she has menstrual cramping and heavy menses that have changed over the past year. Now that periods are last still only 3 days per dramatically worse and discomfort and volume . Patient is interested in hysteroscopy with removal of the polyp, D&C and endometrial ablation. 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 Procedure would be desired for 11 November which would be first week after her next menses  Additionally patient has a small skin tag on the left buttock she wants to have removed at the time of surgery, along the left gluteal crease ROS:  Patient's had a mild cough last day or 2 we'll give a cough suppressant as patient will be traveling by air next week   Pertinent History Reviewed:  Medical & Surgical Hx:  Reviewed: Significant for BMI greater than 30 Medications: Reviewed & Updated - see associated section Social History: Reviewed -  reports that she has never smoked. She has never used smokeless tobacco.  Objective Findings:  Vitals: BP 130/82  Ht 5\' 9"  (1.753 m)  Wt 268 lb (121.564 kg)  BMI 39.56 kg/m2  LMP 10/29/2012  Physical Examination: General appearance - alert, well appearing, and in no distress, oriented to person, place, and time and overweight Mental status - alert, oriented to person, place, and  time, normal mood, behavior, speech, dress, motor activity, and thought processes Eyes - pupils equal and reactive, extraocular eye movements intact Mouth - mucous membranes moist, pharynx normal without lesions Neck - supple, no significant adenopathy Chest - clear to auscultation, no wheezes, rales or rhonchi, symmetric air entry Heart - normal rate and regular rhythm Abdomen - soft, nontender, nondistended, no masses or organomegaly Pelvic - normal external genitalia, vulva, vagina, cervix, uterus and adnexa Extremities - peripheral pulses normal, no pedal edema, no clubbing or cyanosis, no pedal edema noted   Assessment & Plan:   Dysmenorrhea associated with endometrial polyp Desire for cancer reduction by bilateral salpingectomy Left gluteal skin tag for removal Plan Will coordinate surgical removal on 11/28/2012 [facility] for day surgery discussed risks benefits rationale for surgery reviewed

## 2012-11-15 NOTE — Patient Instructions (Signed)
Salpingectomy Salpingectomy, also called tubectomy, is the removal of one of the fallopian tubes with surgery. The fallopian tubes are the tubes that are connected to the uterus. These tubes transport the egg from the ovary to the womb (uterus). Removing one tube does not prevent you from becoming pregnant. Salpingectomy does not have any adverse affects on your menstrual periods. There are many reasons to have a salpingectomy, such as if:  You have a tubal (ectopic) pregnancy. This is especially true if the tube ruptures.  You have an infected tube.  It is necessary to remove the tube when removing an ovary with a cyst or tumor.  The uterus needs to be removed.  You need surgery for cancer of the tube or other female organs. LET YOUR CAREGIVER KNOW ABOUT:  Allergies to foods or medications.  All the medications you are taking. This includes over-the-counter and prescription drugs, herbs, eye drops, creams and steroids.  If you are using illegal drugs or drinking alcohol.  Your smoking habits.  Previous problems with anesthesia including numbing medication.  The possibility of being pregnant.  History of blood clotting or other blood problems.  Previous surgery.  Other medical or health problems. RISKS AND COMPLICATIONS   Injury to surrounding organs.  Bleeding.  Infection.  The surgery does not correct the problem.  You have problems with the anesthesia. BEFORE THE PROCEDURE  Do not take aspirin or blood thinners. They can cause bleeding.  Do not eat or drink anything at least 8 hours before the surgery.  Let your caregiver know if you develop a cold or an infection.  If you are being admitted the day of surgery, arrive at least one hour before the surgery to read and sign the necessary forms and consents.  Arrange for help when you go home from the hospital.  If you smoke, do not smoke for at least 2 weeks before the surgery. PROCEDURE  You will be given an  IV (intravenous) and a medication to relax you. Then, you will be put to sleep with a drug (anesthetic). Any hair on your lower belly (abdomen) will be removed and a catheter will be placed in your bladder. Through 2 very small cuts (incisions) if you have a laparoscopy, or a large incision in the lower abdomen, the fallopian tube will be removed from where it attaches to the uterus. The blood vessels will be clamped and tied.  AFTER THE PROCEDURE   You will be taken to the recovery room and observed for 1 to 3 hours.  If you had a laparoscopy, you may be discharged after several hours.  If you had a large incision, you will be admitted to the hospital for a couple of days.  If you had a laparoscopy, you may have shoulder pain. This is not unusual and is from some air that is left in the abdomen. This air affects the nerve that goes from the diaphragm to the shoulder. It goes away in a day or two.  You will be given pain medication if needed.  The intravenous and catheter will be removed before you are discharged.  Have someone available to take you home. Document Released: 05/23/2008 Document Revised: 03/29/2011 Document Reviewed: 05/23/2008 [facility] Patient Information 2014 [place], [place]. Endometrial Ablation Endometrial ablation removes the lining of the uterus (endometrium). It is usually a same day, outpatient treatment. Ablation helps avoid major surgery (such as a hysterectomy). A hysterectomy is removal of the cervix and uterus. Endometrial ablation has less  risk and complications, has a shorter recovery period and is less expensive. After endometrial ablation, most women will have little or no menstrual bleeding. You may not keep your fertility. Pregnancy is no longer likely after this procedure but if you are pre-menopausal, you still need to use a reliable method of birth control following the procedure because pregnancy can occur. REASONS TO HAVE THE PROCEDURE MAY INCLUDE:  Heavy  periods.  Bleeding that is causing anemia.  Anovulatory bleeding, very irregular, bleeding.  Bleeding submucous fibroids (on the lining inside the uterus) if they are smaller than 3 centimeters. REASONS NOT TO HAVE THE PROCEDURE MAY INCLUDE:  You wish to have more children.  You have a pre-cancerous or cancerous problem. The cause of any abnormal bleeding must be diagnosed before having the procedure.  You have pain coming from the uterus.  You have a submucus fibroid larger than 3 centimeters.  You recently had a baby.  You recently had an infection in the uterus.  You have a severe retro-flexed, tipped uterus and cannot insert the instrument to do the ablation.  You had a Cesarean section or deep major surgery on the uterus.  The inner cavity of the uterus is too large for the endometrial ablation instrument. RISKS AND COMPLICATIONS   Perforation of the uterus.  Bleeding.  Infection of the uterus, bladder or vagina.  Injury to surrounding organs.  Cutting the cervix.  An air bubble to the lung (air embolus).  Pregnancy following the procedure.  Failure of the procedure to help the problem requiring hysterectomy.  Decreased ability to diagnose cancer in the lining of the uterus. BEFORE THE PROCEDURE  The lining of the uterus must be tested to make sure there is no pre-cancerous or cancer cells present.  Medications may be given to make the lining of the uterus thinner.  Ultrasound may be used to evaluate the size and look for abnormalities of the uterus.  Future pregnancy is not desired. PROCEDURE  There are different ways to destroy the lining of the uterus.   Resectoscope - radio frequency-alternating electric current is the most common one used.  Cryotherapy - freezing the lining of the uterus.  Heated Free Liquid - heated salt (saline) solution inserted into the uterus.  Microwave - uses high energy microwaves in the uterus.  Thermal Balloon - a  catheter with a balloon tip is inserted into the uterus and filled with heated fluid. Your caregiver will talk with you about the method used in this clinic. They will also instruct you on the pros and cons of the procedure. Endometrial ablation is performed along with a procedure called operative hysteroscopy. A narrow viewing tube is inserted through the birth canal (vagina) and through the cervix into the uterus. A tiny camera attached to the viewing tube (hysteroscope) allows the uterine cavity to be shown on a TV monitor during surgery. Your uterus is filled with a harmless liquid to make the procedure easier. The lining of the uterus is then removed. The lining can also be removed with a resectoscope which allows your surgeon to cut away the lining of the uterus under direct vision. Usually, you will be able to go home within an hour after the procedure. HOME CARE INSTRUCTIONS   Do not drive for 24 hours.  No tampons, douching or intercourse for 2 weeks or until your caregiver approves.  Rest at home for 24 to 48 hours. You may then resume normal activities unless told differently by your caregiver.  Take your temperature two times a day for 4 days, and record it.  Take any medications your caregiver has ordered, as directed.  Use some form of contraception if you are pre-menopausal and do not want to get pregnant. Bleeding after the procedure is normal. It varies from light spotting and mildly watery to bloody discharge for 4 to 6 weeks. You may also have mild cramping. Only take over-the-counter or prescription medicines for pain, discomfort, or fever as directed by your caregiver. Do not use aspirin, as this may aggravate bleeding. Frequent urination during the first 24 hours is normal. You will not know how effective your surgery is until at least 3 months after the surgery. SEEK IMMEDIATE MEDICAL CARE IF:   Bleeding is heavier than a normal menstrual cycle.  An oral temperature above  102 F (38.9 C) develops.  You have increasing cramps or pains not relieved with medication or develop belly (abdominal) pain which does not seem to be related to the same area of earlier cramping and pain.  You are light headed, weak or have fainting episodes.  You develop pain in the shoulder strap areas.  You have chest or leg pain.  You have abnormal vaginal discharge.  You have painful urination. Document Released: 11/14/2003 Document Revised: 03/29/2011 Document Reviewed: 02/11/2007 [facility] Patient Information 2014 [place], [place].

## 2012-11-23 ENCOUNTER — Encounter (HOSPITAL_COMMUNITY): Payer: Self-pay | Admitting: Pharmacy Technician

## 2012-11-24 ENCOUNTER — Other Ambulatory Visit: Payer: Self-pay | Admitting: Obstetrics and Gynecology

## 2012-11-24 MED ORDER — BISACODYL 10 MG RE SUPP: 10.0000 mg | Freq: Every day | RECTAL | Status: DC | PRN

## 2012-11-24 NOTE — Patient Instructions (Addendum)
Meghan [name]  11/24/2012   Your procedure is scheduled on:  12/01/2012  Report to [facility] at  [place]  AM.  Call this number if you have problems the morning of surgery: [phone]   Remember:   Do not eat food or drink liquids after midnight.   Take these medicines the morning of surgery with A SIP OF WATER: xanax, prilosec   Do not wear jewelry, make-up or nail polish.  Do not wear lotions, powders, or perfumes.   Do not shave 48 hours prior to surgery. Men may shave face and neck.  Do not bring valuables to the hospital.  [facility] is not responsible for any belongings or valuables.               Contacts, dentures or bridgework may not be worn into surgery.  Leave suitcase in the car. After surgery it may be brought to your room.  For patients admitted to the hospital, discharge time is determined by your treatment team.               Patients discharged the day of surgery will not be allowed to drive home.  Name and phone number of your driver: family  Special Instructions: Shower using CHG 2 nights before surgery and the night before surgery.  If you shower the day of surgery use CHG.  Use special wash - you have one bottle of CHG for all showers.  You should use approximately 1/3 of the bottle for each shower.   Please read over the following fact sheets that you were given: Pain Booklet, Coughing and Deep Breathing, Surgical Site Infection Prevention, Anesthesia Post-op Instructions and Care and Recovery After Surgery Sterilization Information, Female Female sterilization is a procedure to permanently prevent pregnancy. There are different ways to perform sterilization, but all either block or close the fallopian tubes so that your eggs cannot reach your uterus. If your egg cannot reach your uterus, sperm cannot fertilize the egg, and you cannot get pregnant.  Sterilization is performed by a surgical procedure. Sometimes these procedures are performed  in a hospital while a patient is asleep. Sometimes they can be done in a clinic setting with the patient awake. The fallopian tubes can be surgically cut, tied, or sealed through a procedure called tubal ligation. The fallopian tubes can also be closed with clips or rings. Sterilization can also be done by placing a tiny coil into each fallopian tube, which causes scar tissue to grow inside the tube. The scar tissue then blocks the tubes.  Discuss sterilization with your caregiver to answer any concerns you or your partner may have. You may want to ask what type of sterilization your caregiver performs. Some caregivers may not perform all the various options. Sterilization is permanent and should only be done if you are sure you do not want children or do not want any more children. Having a sterilization reversed may not be successful.  STERILIZATION PROCEDURES  Laparoscopic sterilization. This is a surgical method performed at a time other than right after childbirth. Two incisions are made in the lower abdomen. A thin, lighted tube (laparoscope) is inserted into one of the incisions and is used to perform the procedure. The fallopian tubes are closed with a ring or a clip. An instrument that uses heat could be used to seal the tubes closed (electrocautery).   Mini-laparotomy. This is a surgical method done 1 or  2 days after giving birth. Typically, a small incision is made just below the belly button (umbilicus) and the fallopian tubes are exposed. The tubes can then be sealed, tied, or cut.   Hysteroscopic sterilization. This is performed at a time other than right after childbirth. A tiny, spring-like coil is inserted through the cervix and uterus and placed into the fallopian tubes. The coil causes scaring and blocks the tubes. Other forms of contraception should be used for 3 months after the procedure to allow the scar tissue to form completely. Additionally, it is required hysterosalpingography be  done 3 months later to ensure that the procedure was successful. Hysterosalpingography is a procedure that uses X-rays to look at your uterus and fallopian tubes after a material to make them show up better has been inserted. IS STERILIZATION SAFE? Sterilization is considered safe with very rare complications. Risks depend on the type of procedure you have. As with any surgical procedure, there are risks. Some risks of sterilization by any means include:   Bleeding.  Infection.  Reaction to anesthesia medicine.  Injury to surrounding organs. Risks specific to having hysteroscopic coils placed include:  The coils may not be placed correctly the first time.   The coils may move out of place.   The tubes may not get completely blocked after 3 months.   Injury to surrounding organs when placing the coil.  HOW EFFECTIVE IS FEMALE STERILIZATION? Sterilization is nearly 100% effective, but it can fail. Depending on the type of sterilization, the rate of failure can be as high as 3%. After hysteroscopic sterilization with placement of fallopian tube coils, you will need back-up birth control for 3 months after the procedure. Sterilization is effective for a lifetime.  BENEFITS OF STERILIZATION  It does not affect your hormones, and therefore will not affect your menstrual periods, sexual desire, or performance.   It is effective for a lifetime.   It is safe.   You do not need to worry about getting pregnant. Keep in mind that if you had the hysteroscopic placement procedure, you must wait 3 months after the procedure (or until your caregiver confirms) before pregnancy is not considered possible.   There are no side effects unlike other types of birth control (contraception).  DRAWBACKS OF STERILIZATION  You must be sure you do not want children or any more children. The procedure is permanent.   It does not provide protection against sexually transmitted infections (STIs).    The tubes can grow back together. If this happens, there is a risk of pregnancy. There is also an increased risk (50%) of pregnancy being an ectopic pregnancy. This is a pregnancy that happens outside of the uterus. Document Released: 06/23/2007 Document Revised: 07/06/2011 Document Reviewed: 04/22/2011 [facility] Patient Information 2014 [place], [place]. Endometrial Ablation Endometrial ablation removes the lining of the uterus (endometrium). It is usually a same-day, outpatient treatment. Ablation helps avoid major surgery, such as surgery to remove the cervix and uterus (hysterectomy). After endometrial ablation, you will have little or no menstrual bleeding and may not be able to have children. However, if you are premenopausal, you will need to use a reliable method of birth control following the procedure because of the small chance that pregnancy can occur. There are different reasons to have this procedure, which include:  Heavy periods.  Bleeding that is causing anemia.  Irregular bleeding.  Bleeding fibroids on the lining inside the uterus if they are smaller than 3 centimeters. This procedure should not  be done if:  You want children in the future.  You have severe cramps with your menstrual period.  You have precancerous or cancerous cells in your uterus.  You were recently pregnant.  You have gone through menopause.  You have had major surgery on the uterus, such as a cesarean delivery. LET [facility] CARE PROVIDER KNOW ABOUT:  Any allergies you have.  All medicines you are taking, including vitamins, herbs, eye drops, creams, and over-the-counter medicines.  Previous problems you or members of your family have had with the use of anesthetics.  Any blood disorders you have.  Previous surgeries you have had.  Medical conditions you have. RISKS AND COMPLICATIONS  Generally, this is a safe procedure. However, as with any procedure, complications can occur.  Possible complications include:  Perforation of the uterus.  Bleeding.  Infection of the uterus, bladder, or vagina.  Injury to surrounding organs.  An air bubble to the lung (air embolus).  Pregnancy following the procedure.  Failure of the procedure to help the problem, requiring hysterectomy.  Decreased ability to diagnose cancer in the lining of the uterus. BEFORE THE PROCEDURE  The lining of the uterus must be tested to make sure there is no pre-cancerous or cancer cells present.  An ultrasound may be performed to look at the size of the uterus and to check for abnormalities.  Medicines may be given to thin the lining of the uterus. PROCEDURE  During the procedure, your health care provider will use a tool called a resectoscope to help see inside your uterus. There are different ways to remove the lining of your uterus.   Radiofrequency  This method uses a radiofrequency-alternating electric current to remove the lining of the uterus.  Cryotherapy This method uses extreme cold to freeze the lining of the uterus.  Heated-Free Liquid  This method uses heated salt (saline) solution to remove the lining of the uterus.  Microwave This method uses high-energy microwaves to heat up the lining of the uterus to remove it.  Thermal balloon  This method involves inserting a catheter with a balloon tip into the uterus. The balloon tip is filled with heated fluid to remove the lining of the uterus. AFTER THE PROCEDURE  After your procedure, do not have sexual intercourse or insert anything into your vagina until permitted by your health care provider. After the procedure, you may experience:  Cramps.  Vaginal discharge.  Frequent urination. Document Released: 11/14/2003 Document Revised: 09/06/2012 Document Reviewed: 06/07/2012 [facility] Patient Information 2014 [place], [place]. Hysteroscopy Hysteroscopy is a procedure used for looking inside the womb (uterus). It may be done  for many different reasons, including:  To evaluate abnormal bleeding, fibroid (benign, noncancerous) tumors, polyps, scar tissue (adhesions), and possibly cancer of the uterus.  To look for lumps (tumors) and other uterine growths.  To look for causes of why a woman cannot get pregnant (infertility), causes of recurrent loss of pregnancy (miscarriages), or a lost intrauterine device (IUD).  To perform a sterilization by blocking the fallopian tubes from inside the uterus. A hysteroscopy should be done right after a menstrual period to be sure you are not pregnant. LET YOUR CAREGIVER KNOW ABOUT:   Allergies.  Medicines taken, including herbs, eyedrops, over-the-counter medicines, and creams.  Use of steroids (by mouth or creams).  Previous problems with anesthetics or numbing medicines.  History of bleeding or blood problems.  History of blood clots.  Possibility of pregnancy, if this applies.  Previous surgery.  Other  health problems. RISKS AND COMPLICATIONS   Putting a hole in the uterus.  Excessive bleeding.  Infection.  Damage to the cervix.  Injury to other organs.  Allergic reaction to medicines.  Too much fluid used in the uterus for the procedure. BEFORE THE PROCEDURE   Do not take aspirin or blood thinners for a week before the procedure, or as directed. It can cause bleeding.  Arrive at least 60 minutes before the procedure or as directed to read and sign the necessary forms.  Arrange for someone to take you home after the procedure.  If you smoke, do not smoke for 2 weeks before the procedure. PROCEDURE   Your caregiver may give you medicine to relax you. He or she may also give you a medicine that numbs the area around the cervix (local anesthetic) or a medicine that makes you sleep (general anesthesia).  Sometimes, a medicine is placed in the cervix the day before the procedure. This medicine makes the cervix have a larger opening (dilate). This  makes it easier for the instrument to be inserted into the uterus.  A small instrument (hysteroscope) is inserted through the vagina into the uterus. This instrument is similar to a pencil-sized telescope with a light.  During the procedure, air or a liquid is put into the uterus, which allows the surgeon to see better.  Sometimes, tissue is gently scraped from inside the uterus. These tissue samples are sent to a specialist who looks at tissue samples (pathologist). The pathologist will give a report to your caregiver. This will help your caregiver decide if further treatment is necessary. The report will also help your caregiver decide on the best treatment if the test comes back abnormal. AFTER THE PROCEDURE   If you had a general anesthetic, you may be groggy for a couple hours after the procedure.  If you had a local anesthetic, you will be advised to rest at the surgical center or caregiver's office until you are stable and feel ready to go home.  You may have some cramping for a couple days.  You may have bleeding, which varies from light spotting for a few days to menstrual-like bleeding for up to 3 to 7 days. This is normal.  Have someone take you home. FINDING OUT THE RESULTS OF YOUR TEST Not all test results are available during your visit. If your test results are not back during the visit, make an appointment with your caregiver to find out the results. Do not assume everything is normal if you have not heard from your caregiver or the medical facility. It is important for you to follow up on all of your test results. HOME CARE INSTRUCTIONS   Do not drive for 24 hours or as instructed.  Only take over-the-counter or prescription medicines for pain, discomfort, or fever as directed by your caregiver.  Do not take aspirin. It can cause or aggravate bleeding.  Do not drive or drink alcohol while taking pain medicine.  You may resume your usual diet.  Do not use tampons,  douche, or have sexual intercourse for 2 weeks, or as advised by your caregiver.  Rest and sleep for the first 24 to 48 hours.  Take your temperature twice a day for 4 to 5 days. Write it down. Give these temperatures to your caregiver if they are abnormal (above 98.6 F or 37.0 C).  Take medicines your caregiver has ordered as directed.  Follow your caregiver's advice regarding diet, exercise, lifting, driving,  and general activities.  Take showers instead of baths for 2 weeks, or as recommended by your caregiver.  If you develop constipation:  Take a mild laxative with the advice of your caregiver.  Eat bran foods.  Drink enough water and fluids to keep your urine clear or pale yellow.  Try to have someone with you or available to you for the first 24 to 48 hours, especially if you had a general anesthetic.  Make sure you and your family understand everything about your operation and recovery.  Follow your caregiver's advice regarding follow-up appointments and Pap smears. SEEK MEDICAL CARE IF:   You feel dizzy or lightheaded.  You feel sick to your stomach (nauseous).  You develop abnormal vaginal discharge.  You develop a rash.  You have an abnormal reaction or allergy to your medicine.  You need stronger pain medicine. SEEK IMMEDIATE MEDICAL CARE IF:   Bleeding is heavier than a normal menstrual period or you have blood clots.  You have an oral temperature above 102 F (38.9 C), not controlled by medicine.  You have increasing cramps or pains not relieved with medicine.  You develop belly (abdominal) pain that does not seem to be related to the same area of earlier cramping and pain.  You pass out.  You develop pain in the tops of your shoulders (shoulder strap areas).  You develop shortness of breath. MAKE SURE YOU:   Understand these instructions.  Will watch your condition.  Will get help right away if you are not doing well or get worse. Document  Released: 04/12/2000 Document Revised: 03/29/2011 Document Reviewed: 08/03/2012 [facility] Patient Information 2014 [place], [place]. Dilation and Curettage or Vacuum Curettage Dilation and curettage (D&C) and vacuum curettage are minor procedures. A D&C involves stretching (dilation) the cervix and scraping (curettage) the inside lining of the womb (uterus). During a D&C, tissue is gently scraped from the inside lining of the uterus. During a vacuum curettage, the lining and tissue in the uterus are removed with the use of gentle suction.  Curettage may be performed to either diagnose or treat a problem. As a diagnostic procedure, curettage is performed to examine tissues from the uterus. A diagnostic curettage may be performed for the following symptoms:   Irregular bleeding in the uterus.   Bleeding with the development of clots.   Spotting between menstrual periods.   Prolonged menstrual periods.   Bleeding after menopause.   No menstrual period (amenorrhea).   A change in size and shape of the uterus.  As a treatment procedure, curettage may be performed for the following reasons:   Removal of an IUD (intrauterine device).   Removal of retained placenta after giving birth. Retained placenta can cause an infection or bleeding severe enough to require transfusions.   Abortion.   Miscarriage.   Removal of polyps inside the uterus.   Removal of uncommon types of noncancerous lumps (fibroids).  LET [facility] CARE PROVIDER KNOW ABOUT:   Any allergies you have.   All medicines you are taking, including vitamins, herbs, eye drops, creams, and over-the-counter medicines.   Previous problems you or members of your family have had with the use of anesthetics.   Any blood disorders you have.   Previous surgeries you have had.   Medical conditions you have. RISKS AND COMPLICATIONS  Generally, this is a safe procedure. However, as with any procedure,  complications can occur. Possible complications include:  Excessive bleeding.   Infection of the uterus.  Damage to the cervix.   Development of scar tissue (adhesions) inside the uterus, later causing abnormal amounts of menstrual bleeding.   Complications from the general anesthetic, if a general anesthetic is used.   Putting a hole (perforation) in the uterus. This is rare.  BEFORE THE PROCEDURE   Eat and drink before the procedure only as directed by your health care provider.   Arrange for someone to take you home.  PROCEDURE  This procedure usually takes about 15 30 minutes.  You will be given one of the following:  A medicine that numbs the area in and around the cervix (local anesthetic).   A medicine to make you sleep through the procedure (general anesthetic).  You will lie on your back with your legs in stirrups.   A warm metal or plastic instrument (speculum) will be placed in your vagina to keep it open and to allow the health care provider to see the cervix.  There are two ways in which your cervix can be softened and dilated. These include:   Taking a medicine.   Having thin rods (laminaria) inserted into your cervix.   A curved tool (curette) will be used to scrape cells from the inside lining of the uterus. In some cases, gentle suction is applied with the curette. The curette will then be removed.  AFTER THE PROCEDURE   You will rest in the recovery area until you are stable and are ready to go home.   You may feel sick to your stomach (nauseous) or throw up (vomit) if you were given a general anesthetic.   You may have a sore throat if a tube was placed in your throat during general anesthesia.   You may have light cramping and bleeding. This may last for 2 days to 2 weeks after the procedure.   Your uterus needs to make a new lining after the procedure. This may make your next period late. Document Released: 01/04/2005 Document  Revised: 09/06/2012 Document Reviewed: 08/03/2012 [facility] Patient Information 2014 [place], [place]. PATIENT INSTRUCTIONS POST-ANESTHESIA  IMMEDIATELY FOLLOWING SURGERY:  Do not drive or operate machinery for the first twenty four hours after surgery.  Do not make any important decisions for twenty four hours after surgery or while taking narcotic pain medications or sedatives.  If you develop intractable nausea and vomiting or a severe headache please notify your doctor immediately.  FOLLOW-UP:  Please make an appointment with your surgeon as instructed. You do not need to follow up with anesthesia unless specifically instructed to do so.  WOUND CARE INSTRUCTIONS (if applicable):  Keep a dry clean dressing on the anesthesia/puncture wound site if there is drainage.  Once the wound has quit draining you may leave it open to air.  Generally you should leave the bandage intact for twenty four hours unless there is drainage.  If the epidural site drains for more than 36-48 hours please call the anesthesia department.  QUESTIONS?:  Please feel free to call your physician or the hospital operator if you have any questions, and they will be happy to assist you.

## 2012-11-24 NOTE — H&P (Signed)
  CC & HPI:   Meghan [name] is a 41 y.o. female presenting today for decision making regarding endometrial polyp found on pelvic after last weeks annual exam. Patient is then menstrual cycle week 3, on no contraception method, husband has had vasectomy  Endometrial thickness is essentially normal other than the polyp she has menstrual cramping and heavy menses that have changed over the past year. Now that periods are last still only 3 days per dramatically worse and discomfort and volume  . Patient is interested in hysteroscopy with removal of the polyp, D&C and endometrial ablation.  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 Procedure would be desired for 11 November which would be first week after her next menses  Additionally patient has a small skin tag on the left buttock she wants to have removed at the time of surgery, along the left gluteal crease  ROS:   Patient's had a mild cough last day or 2 we'll give a cough suppressant as patient will be traveling by air next week  Pertinent History Reviewed:   Medical & Surgical Hx: Reviewed: Significant for BMI greater than 30  Medications: Reviewed & Updated - see associated section  Social History: Reviewed - reports that she has never smoked. She has never used smokeless tobacco.  Objective Findings:   Vitals: BP 130/82  Ht 5' 9" (1.753 m)  Wt 268 lb (121.564 kg)  BMI 39.56 kg/m2  LMP 10/29/2012  Physical Examination: General appearance - alert, well appearing, and in no distress, oriented to person, place, and time and overweight  Mental status - alert, oriented to person, place, and time, normal mood, behavior, speech, dress, motor activity, and thought processes  Eyes - pupils equal and reactive, extraocular eye movements intact  Mouth - mucous membranes moist, pharynx normal without lesions  Neck - supple, no  significant adenopathy  Chest - clear to auscultation, no wheezes, rales or rhonchi, symmetric air entry  Heart - normal rate and regular rhythm  Abdomen - soft, nontender, nondistended, no masses or organomegaly  Pelvic - normal external genitalia, vulva, vagina, cervix, uterus and adnexa  Extremities - peripheral pulses normal, no pedal edema, no clubbing or cyanosis, no pedal edema noted  Assessment & Plan:   Dysmenorrhea associated with endometrial polyp  Desire for cancer reduction by bilateral salpingectomy  Left gluteal skin tag for removal  Plan Will coordinate surgical removal on 12/01/2012 [facility] for day surgery discussed risks benefits rationale for surgery reviewed     

## 2012-11-27 ENCOUNTER — Encounter (HOSPITAL_COMMUNITY): Payer: Self-pay

## 2012-11-27 ENCOUNTER — Encounter (HOSPITAL_COMMUNITY)
Admission: RE | Admit: 2012-11-27 | Discharge: 2012-11-27 | Disposition: A | Discharge status: Home / Self Care | Payer: Federal, State, Local not specified - PPO | Source: Ambulatory Visit | Attending: Obstetrics and Gynecology | Admitting: Obstetrics and Gynecology

## 2012-11-27 DIAGNOSIS — Z01812 Encounter for preprocedural laboratory examination: Secondary | ICD-10-CM | POA: Insufficient documentation

## 2012-11-27 LAB — URINALYSIS, ROUTINE W REFLEX MICROSCOPIC
Glucose, UA: NEGATIVE mg/dL
Leukocytes, UA: NEGATIVE
pH: 6 (ref 5.0–8.0)

## 2012-11-27 LAB — CBC WITH DIFFERENTIAL/PLATELET
Eosinophils Absolute: 0.1 10*3/uL (ref 0.0–0.7)
HCT: 41.9 % (ref 36.0–46.0)
Hemoglobin: 13.8 g/dL (ref 12.0–15.0)
Lymphocytes Relative: 19 % (ref 12–46)
Lymphs Abs: 1.9 10*3/uL (ref 0.7–4.0)
MCH: 27.8 pg (ref 26.0–34.0)
Monocytes Absolute: 0.5 10*3/uL (ref 0.1–1.0)
Monocytes Relative: 5 % (ref 3–12)
Neutro Abs: 7.3 10*3/uL (ref 1.7–7.7)
RDW: 13.5 % (ref 11.5–15.5)
WBC: 9.9 10*3/uL (ref 4.0–10.5)

## 2012-11-27 LAB — HCG, SERUM, QUALITATIVE: Preg, Serum: NEGATIVE

## 2012-12-01 ENCOUNTER — Encounter (HOSPITAL_COMMUNITY): Payer: Federal, State, Local not specified - PPO | Admitting: Anesthesiology

## 2012-12-01 ENCOUNTER — Ambulatory Visit (HOSPITAL_COMMUNITY): Payer: Federal, State, Local not specified - PPO | Admitting: Anesthesiology

## 2012-12-01 ENCOUNTER — Ambulatory Visit (HOSPITAL_COMMUNITY)
Admission: RE | Admit: 2012-12-01 | Discharge: 2012-12-01 | Disposition: A | Discharge status: Home / Self Care | Payer: Federal, State, Local not specified - PPO | Source: Ambulatory Visit | Attending: Obstetrics and Gynecology | Admitting: Obstetrics and Gynecology

## 2012-12-01 ENCOUNTER — Encounter (HOSPITAL_COMMUNITY)
Admission: RE | Disposition: A | Discharge status: Home / Self Care | Payer: Self-pay | Source: Ambulatory Visit | Attending: Obstetrics and Gynecology

## 2012-12-01 ENCOUNTER — Encounter (HOSPITAL_COMMUNITY): Payer: Self-pay | Admitting: *Deleted

## 2012-12-01 DIAGNOSIS — N920 Excessive and frequent menstruation with regular cycle: Secondary | ICD-10-CM | POA: Insufficient documentation

## 2012-12-01 DIAGNOSIS — N840 Polyp of corpus uteri: Secondary | ICD-10-CM | POA: Insufficient documentation

## 2012-12-01 DIAGNOSIS — L919 Hypertrophic disorder of the skin, unspecified: Secondary | ICD-10-CM

## 2012-12-01 DIAGNOSIS — N831 Corpus luteum cyst of ovary, unspecified side: Secondary | ICD-10-CM

## 2012-12-01 DIAGNOSIS — Z302 Encounter for sterilization: Secondary | ICD-10-CM | POA: Insufficient documentation

## 2012-12-01 DIAGNOSIS — L909 Atrophic disorder of skin, unspecified: Secondary | ICD-10-CM | POA: Insufficient documentation

## 2012-12-01 HISTORY — PX: LAPAROSCOPIC BILATERAL SALPINGECTOMY: SHX5889

## 2012-12-01 HISTORY — PX: DILITATION & CURRETTAGE/HYSTROSCOPY WITH THERMACHOICE ABLATION: SHX5569

## 2012-12-01 HISTORY — PX: LESION REMOVAL: SHX5196

## 2012-12-01 SURGERY — DILATATION & CURETTAGE/HYSTEROSCOPY WITH THERMACHOICE ABLATION
Anesthesia: General | Wound class: Clean Contaminated

## 2012-12-01 MED ORDER — GLYCOPYRROLATE 0.2 MG/ML IJ SOLN
INTRAMUSCULAR | Status: DC | PRN
  Administered 2012-12-01: 0.6 mg via INTRAVENOUS

## 2012-12-01 MED ORDER — SEVOFLURANE IN SOLN
RESPIRATORY_TRACT | Status: AC
  Filled 2012-12-01: qty 250

## 2012-12-01 MED ORDER — NEOSTIGMINE METHYLSULFATE 1 MG/ML IJ SOLN
INTRAMUSCULAR | Status: DC | PRN
  Administered 2012-12-01: 4 mg via INTRAVENOUS

## 2012-12-01 MED ORDER — FENTANYL CITRATE 0.05 MG/ML IJ SOLN
INTRAMUSCULAR | Status: DC | PRN
  Administered 2012-12-01: 50 ug via INTRAVENOUS
  Administered 2012-12-01: 100 ug via INTRAVENOUS
  Administered 2012-12-01 (×4): 50 ug via INTRAVENOUS

## 2012-12-01 MED ORDER — FENTANYL CITRATE 0.05 MG/ML IJ SOLN
INTRAMUSCULAR | Status: AC
  Filled 2012-12-01: qty 5

## 2012-12-01 MED ORDER — KETOROLAC TROMETHAMINE 30 MG/ML IJ SOLN
30.0000 mg | Freq: Once | INTRAMUSCULAR | Status: AC
  Administered 2012-12-01: 30 mg via INTRAVENOUS

## 2012-12-01 MED ORDER — FENTANYL CITRATE 0.05 MG/ML IJ SOLN
INTRAMUSCULAR | Status: AC
  Filled 2012-12-01: qty 2

## 2012-12-01 MED ORDER — SODIUM CHLORIDE 0.9 % IJ SOLN
INTRAMUSCULAR | Status: AC
  Filled 2012-12-01: qty 10

## 2012-12-01 MED ORDER — ROCURONIUM BROMIDE 100 MG/10ML IV SOLN
INTRAVENOUS | Status: DC | PRN
  Administered 2012-12-01: 40 mg via INTRAVENOUS

## 2012-12-01 MED ORDER — BUPIVACAINE-EPINEPHRINE PF 0.5-1:200000 % IJ SOLN
INTRAMUSCULAR | Status: AC
  Filled 2012-12-01: qty 10

## 2012-12-01 MED ORDER — OXYCODONE-ACETAMINOPHEN 5-325 MG PO TABS: 1.0000 | ORAL_TABLET | ORAL | Status: DC | PRN

## 2012-12-01 MED ORDER — KETOROLAC TROMETHAMINE 30 MG/ML IJ SOLN
INTRAMUSCULAR | Status: AC
  Filled 2012-12-01: qty 1

## 2012-12-01 MED ORDER — CEFAZOLIN SODIUM-DEXTROSE 2-3 GM-% IV SOLR
2.0000 g | INTRAVENOUS | Status: AC
  Administered 2012-12-01: 2 g via INTRAVENOUS

## 2012-12-01 MED ORDER — GLYCOPYRROLATE 0.2 MG/ML IJ SOLN
INTRAMUSCULAR | Status: AC
  Filled 2012-12-01: qty 3

## 2012-12-01 MED ORDER — FENTANYL CITRATE 0.05 MG/ML IJ SOLN
25.0000 ug | INTRAMUSCULAR | Status: DC | PRN
  Administered 2012-12-01 (×4): 50 ug via INTRAVENOUS

## 2012-12-01 MED ORDER — LIDOCAINE HCL (CARDIAC) 20 MG/ML IV SOLN
INTRAVENOUS | Status: DC | PRN
  Administered 2012-12-01: 50 mg via INTRAVENOUS

## 2012-12-01 MED ORDER — SODIUM CHLORIDE 0.9 % IR SOLN
Status: DC | PRN
  Administered 2012-12-01: 3000 mL

## 2012-12-01 MED ORDER — CEFAZOLIN SODIUM-DEXTROSE 2-3 GM-% IV SOLR
INTRAVENOUS | Status: AC
  Filled 2012-12-01: qty 50

## 2012-12-01 MED ORDER — 0.9 % SODIUM CHLORIDE (POUR BTL) OPTIME
TOPICAL | Status: DC | PRN
  Administered 2012-12-01: 1000 mL

## 2012-12-01 MED ORDER — KETOROLAC TROMETHAMINE 10 MG PO TABS: 10.0000 mg | ORAL_TABLET | Freq: Four times a day (QID) | ORAL | Status: DC | PRN

## 2012-12-01 MED ORDER — MIDAZOLAM HCL 2 MG/2ML IJ SOLN
INTRAMUSCULAR | Status: AC
  Filled 2012-12-01: qty 2

## 2012-12-01 MED ORDER — MIDAZOLAM HCL 2 MG/2ML IJ SOLN
1.0000 mg | INTRAMUSCULAR | Status: DC | PRN
  Administered 2012-12-01: 2 mg via INTRAVENOUS

## 2012-12-01 MED ORDER — ARTIFICIAL TEARS OP OINT
TOPICAL_OINTMENT | OPHTHALMIC | Status: AC
  Filled 2012-12-01: qty 3.5

## 2012-12-01 MED ORDER — DEXTROSE 5 % IV SOLN
INTRAVENOUS | Status: DC | PRN
  Administered 2012-12-01: 9 mL

## 2012-12-01 MED ORDER — ROCURONIUM BROMIDE 50 MG/5ML IV SOLN
INTRAVENOUS | Status: AC
  Filled 2012-12-01: qty 1

## 2012-12-01 MED ORDER — LIDOCAINE HCL (PF) 1 % IJ SOLN
INTRAMUSCULAR | Status: AC
  Filled 2012-12-01: qty 5

## 2012-12-01 MED ORDER — SUCCINYLCHOLINE CHLORIDE 20 MG/ML IJ SOLN
INTRAMUSCULAR | Status: AC
  Filled 2012-12-01: qty 1

## 2012-12-01 MED ORDER — ONDANSETRON HCL 4 MG/2ML IJ SOLN
INTRAMUSCULAR | Status: AC
  Filled 2012-12-01: qty 2

## 2012-12-01 MED ORDER — ONDANSETRON HCL 4 MG/2ML IJ SOLN
4.0000 mg | Freq: Once | INTRAMUSCULAR | Status: AC | PRN
  Administered 2012-12-01: 4 mg via INTRAVENOUS
  Filled 2012-12-01: qty 2

## 2012-12-01 MED ORDER — PROPOFOL 10 MG/ML IV EMUL
INTRAVENOUS | Status: AC
  Filled 2012-12-01: qty 20

## 2012-12-01 MED ORDER — BUPIVACAINE-EPINEPHRINE PF 0.5-1:200000 % IJ SOLN
INTRAMUSCULAR | Status: DC | PRN
  Administered 2012-12-01: 20 mL

## 2012-12-01 MED ORDER — LACTATED RINGERS IV SOLN
INTRAVENOUS | Status: DC
  Administered 2012-12-01: 1000 mL via INTRAVENOUS

## 2012-12-01 MED ORDER — ONDANSETRON HCL 4 MG/2ML IJ SOLN
4.0000 mg | Freq: Once | INTRAMUSCULAR | Status: AC
  Administered 2012-12-01: 4 mg via INTRAVENOUS

## 2012-12-01 MED ORDER — PROPOFOL 10 MG/ML IV BOLUS
INTRAVENOUS | Status: DC | PRN
  Administered 2012-12-01: 150 mg via INTRAVENOUS

## 2012-12-01 MED ORDER — MIDAZOLAM HCL 5 MG/5ML IJ SOLN
INTRAMUSCULAR | Status: DC | PRN
  Administered 2012-12-01: 2 mg via INTRAVENOUS

## 2012-12-01 MED ORDER — LACTATED RINGERS IV SOLN
INTRAVENOUS | Status: DC | PRN
  Administered 2012-12-01 (×2): via INTRAVENOUS

## 2012-12-01 SURGICAL SUPPLY — 44 items
BAG DECANTER FOR FLEXI CONT (MISCELLANEOUS) ×4 IMPLANT
BAG HAMPER (MISCELLANEOUS) ×4 IMPLANT
BLADE SURG SZ11 CARB STEEL (BLADE) ×4 IMPLANT
CATH ROBINSON RED A/P 16FR (CATHETERS) ×4 IMPLANT
CATH THERMACHOICE III (CATHETERS) ×4 IMPLANT
CLOTH BEACON ORANGE TIMEOUT ST (SAFETY) ×4 IMPLANT
COVER LIGHT HANDLE STERIS (MISCELLANEOUS) ×8 IMPLANT
DRESSING COVERLET 3X1 FLEXIBLE (GAUZE/BANDAGES/DRESSINGS) ×12 IMPLANT
DURAPREP 26ML APPLICATOR (WOUND CARE) ×4 IMPLANT
ELECT REM PT RETURN 9FT ADLT (ELECTROSURGICAL) ×4
ELECTRODE REM PT RTRN 9FT ADLT (ELECTROSURGICAL) ×3 IMPLANT
FORMALIN 10 PREFIL 120ML (MISCELLANEOUS) ×5 IMPLANT
GLOVE BIOGEL PI IND STRL 9 (GLOVE) ×6 IMPLANT
GLOVE BIOGEL PI INDICATOR 9 (GLOVE) ×2
GLOVE ECLIPSE 9.0 STRL (GLOVE) ×4 IMPLANT
GOWN STRL REIN 3XL LVL4 (GOWN DISPOSABLE) ×4 IMPLANT
GOWN STRL REIN XL XLG (GOWN DISPOSABLE) ×7 IMPLANT
INST SET HYSTEROSCOPY (KITS) ×4 IMPLANT
INST SET LAPROSCOPIC GYN AP (KITS) ×4 IMPLANT
IV D5W 500ML (IV SOLUTION) ×4 IMPLANT
IV NS IRRIG 3000ML ARTHROMATIC (IV SOLUTION) ×4 IMPLANT
KIT ROOM TURNOVER AP CYSTO (KITS) ×4 IMPLANT
KIT ROOM TURNOVER APOR (KITS) ×4 IMPLANT
MANIFOLD NEPTUNE II (INSTRUMENTS) ×4 IMPLANT
NEEDLE INSUFFLATION 120MM (ENDOMECHANICALS) ×4 IMPLANT
NS IRRIG 1000ML POUR BTL (IV SOLUTION) ×4 IMPLANT
PACK PERI GYN (CUSTOM PROCEDURE TRAY) ×4 IMPLANT
PAD ARMBOARD 7.5X6 YLW CONV (MISCELLANEOUS) ×4 IMPLANT
PAD TELFA 3X4 1S STER (GAUZE/BANDAGES/DRESSINGS) ×4 IMPLANT
SET BASIN LINEN APH (SET/KITS/TRAYS/PACK) ×4 IMPLANT
SET IRRIG Y TYPE TUR BLADDER L (SET/KITS/TRAYS/PACK) ×4 IMPLANT
SOLUTION ANTI FOG 6CC (MISCELLANEOUS) ×4 IMPLANT
STRIP CLOSURE SKIN 1/2X4 (GAUZE/BANDAGES/DRESSINGS) ×4 IMPLANT
SUT VIC AB 4-0 PS2 27 (SUTURE) ×4 IMPLANT
SUT VICRYL 0 UR6 27IN ABS (SUTURE) ×7 IMPLANT
SYR BULB IRRIGATION 50ML (SYRINGE) ×1 IMPLANT
SYR CONTROL 10ML LL (SYRINGE) ×4 IMPLANT
SYRINGE 10CC LL (SYRINGE) ×4 IMPLANT
TROCAR ENDO BLADELESS 11MM (ENDOMECHANICALS) ×4 IMPLANT
TROCAR XCEL NON-BLD 5MMX100MML (ENDOMECHANICALS) ×4 IMPLANT
TROCAR XCEL UNIV SLVE 11M 100M (ENDOMECHANICALS) ×4 IMPLANT
TUBING INSUFFLATION (TUBING) ×4 IMPLANT
WARMER LAPAROSCOPE (MISCELLANEOUS) ×4 IMPLANT
WATER STERILE IRR 1000ML POUR (IV SOLUTION) ×4 IMPLANT

## 2012-12-01 NOTE — H&P (View-Only) (Signed)
  CC & HPI:   Meghan [name] is a 42 y.o. female presenting today for decision making regarding endometrial polyp found on pelvic after last weeks annual exam. Patient is then menstrual cycle week 3, on no contraception method, husband has had vasectomy  Endometrial thickness is essentially normal other than the polyp she has menstrual cramping and heavy menses that have changed over the past year. Now that periods are last still only 3 days per dramatically worse and discomfort and volume  . Patient is interested in hysteroscopy with removal of the polyp, D&C and endometrial ablation.  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 Procedure would be desired for 11 November which would be first week after her next menses  Additionally patient has a small skin tag on the left buttock she wants to have removed at the time of surgery, along the left gluteal crease  ROS:   Patient's had a mild cough last day or 2 we'll give a cough suppressant as patient will be traveling by air next week  Pertinent History Reviewed:   Medical & Surgical Hx: Reviewed: Significant for BMI greater than 30  Medications: Reviewed & Updated - see associated section  Social History: Reviewed - reports that she has never smoked. She has never used smokeless tobacco.  Objective Findings:   Vitals: BP 130/82  Ht 5\' 9"  (1.753 m)  Wt 268 lb (121.564 kg)  BMI 39.56 kg/m2  LMP 10/29/2012  Physical Examination: General appearance - alert, well appearing, and in no distress, oriented to person, place, and time and overweight  Mental status - alert, oriented to person, place, and time, normal mood, behavior, speech, dress, motor activity, and thought processes  Eyes - pupils equal and reactive, extraocular eye movements intact  Mouth - mucous membranes moist, pharynx normal without lesions  Neck - supple, no  significant adenopathy  Chest - clear to auscultation, no wheezes, rales or rhonchi, symmetric air entry  Heart - normal rate and regular rhythm  Abdomen - soft, nontender, nondistended, no masses or organomegaly  Pelvic - normal external genitalia, vulva, vagina, cervix, uterus and adnexa  Extremities - peripheral pulses normal, no pedal edema, no clubbing or cyanosis, no pedal edema noted  Assessment & Plan:   Dysmenorrhea associated with endometrial polyp  Desire for cancer reduction by bilateral salpingectomy  Left gluteal skin tag for removal  Plan Will coordinate surgical removal on 12/01/2012 [facility] for day surgery discussed risks benefits rationale for surgery reviewed

## 2012-12-01 NOTE — Brief Op Note (Signed)
12/01/2012  9:34 AM  PATIENT:  Meghan [name]  42 y.o. female  PRE-OPERATIVE DIAGNOSIS:  menorrhagia skin tag left inner thigh desires sterilization endometrial polyp   POST-OPERATIVE DIAGNOSIS:  menorrhagia skin tag left inner thigh endometrial polyp desires sterilization   PROCEDURE:  Procedure(s): DILATATION & CURETTAGE;REMOVAL OF ENDOMETRIAL POLYP; HYSTEROSCOPY WITH THERMACHOICE ENDOMETRIAL ABLATION (N/A) LAPAROSCOPIC BILATERAL SALPINGECTOMY  (Bilateral) SKIN TAG REMOVAL (Left inner thigh)  (Left)  SURGEON:  Surgeon(s) and Role:    * [name], MD - Primary  PHYSICIAN ASSISTANT:   ASSISTANTS: Trenton Founds, CST   ANESTHESIA:   general and paracervical block  EBL:  Total I/O In: 1200 [I.V.:1200] Out: 20 [Blood:20]  BLOOD ADMINISTERED:none  DRAINS: none   LOCAL MEDICATIONS USED:  MARCAINE   20 cc  SPECIMEN:  Source of Specimen:  Bilateral fallopian tubes, endometrial curettings with polyp  DISPOSITION OF SPECIMEN:  PATHOLOGY  COUNTS:  YES  TOURNIQUET:  * No tourniquets in log *  DICTATION: .Dragon Dictation  PLAN OF CARE: Discharge to home after PACU  PATIENT DISPOSITION:  PACU - hemodynamically stable.   Delay start of Pharmacological VTE agent (>24hrs) due to surgical blood loss or risk of bleeding: not applicable

## 2012-12-01 NOTE — Anesthesia Postprocedure Evaluation (Signed)
  Anesthesia Post-op Note  Patient: Meghan [name]  Procedure(s) Performed: Procedure(s): DILATATION & CURETTAGE;REMOVAL OF ENDOMETRIAL POLYP; HYSTEROSCOPY WITH THERMACHOICE ENDOMETRIAL ABLATION (N/A) LAPAROSCOPIC BILATERAL SALPINGECTOMY  (Bilateral) SKIN TAG REMOVAL (Left inner thigh)  (Left)  Patient Location: PACU  Anesthesia Type:General  Level of Consciousness: sedated and patient cooperative  Airway and Oxygen Therapy: Patient Spontanous Breathing and Patient connected to face mask oxygen  Post-op Pain: mild  Post-op Assessment: Post-op Vital signs reviewed, Patient's Cardiovascular Status Stable, Respiratory Function Stable, Patent Airway, No signs of Nausea or vomiting and Pain level controlled  Post-op Vital Signs: Reviewed and stable  Complications: No apparent anesthesia complications

## 2012-12-01 NOTE — Op Note (Signed)
12/01/2012  9:34 AM  PATIENT:  Meghan [name]  42 y.o. female  PRE-OPERATIVE DIAGNOSIS:  menorrhagia skin tag left inner thigh desires sterilization endometrial polyp   POST-OPERATIVE DIAGNOSIS:  menorrhagia skin tag left inner thigh endometrial polyp desires sterilization   PROCEDURE:  Procedure(s): DILATATION & CURETTAGE;REMOVAL OF ENDOMETRIAL POLYP; HYSTEROSCOPY WITH THERMACHOICE ENDOMETRIAL ABLATION (N/A) LAPAROSCOPIC BILATERAL SALPINGECTOMY  (Bilateral) SKIN TAG REMOVAL (Left inner thigh)  (Left)  SURGEON:  Surgeon(s) and Role:    * [name], MD - Primary  PHYSICIAN ASSISTANT:   ASSISTANTS: Trenton Founds, CST   ANESTHESIA:   general and paracervical block  Details of procedure. Patient was taken operating room prepped and draped for combined abdominal and vaginal procedure. In And out catheterization was performed. Time out Had been conducted and procedure confirmed by surgical team. The skin tag of left gluteal crease was then elevated and trimmed and discarded Steri-Strips applied. Single-tooth tenaculum used to grasp the uterus for manipulation. An infraumbilical vertical 1 cm skin incision was then performed and suprapubic incision of similar length. Veress needle was used to achieve pneumoperitoneum and laparoscopic trocar introduced under direct visualization. Suprapubic and right lower quadrant trochars were placed under direct visualization. Attention of the pelvis showed a small anterior fundal fibroid and normal tubes and ovaries. Using unipolar cautery bilateral salpingectomy was performed with good hemostasis. Tissue was extracted suprapubically. Fascia was closed at the umbilicus with 0 Vicryl and subcuticular 4-0 Vicryl close all the laparoscopic sites. Hysteroscopy followed. The speculum was inserted the single-tooth tenaculum repositioned, the uterus sounded to 10 cm dilated to 23 [place] and the rigid 30 operative hysteroscope introduced and  identified a smooth endometrial cavity with a suspected secretory endometrium and a large polyp based on the posterior wall of the uterus which could then be amputated and extracted with the grasping device. Curettage followed, then the anterior ThermaChoice endometrial ablation device was repaired inserted filled with 8 cc of D5W and the 8 minute thermal ablation sequence completed. All 8 cc were recovered. Paracervical block using 20 cc of Marcaine with epinephrine was completed and then patient allowed to go recovery in stable condition sponge and needle counts correct

## 2012-12-01 NOTE — Interval H&P Note (Signed)
History and Physical Interval Note:  12/01/2012 7:53 AM  Meghan [name]  has presented today for surgery, with the diagnosis of menorrhagia skin tag removal sterlization  The various methods of treatment have been discussed with the patient and family. After consideration of risks, benefits and other options for treatment, the patient has consented to  Procedure(s): DILATATION & CURETTAGE;REMOVAL OF ENDOMETRIAL POLYP; HYSTEROSCOPY WITH THERMACHOICE ENDOMETRIAL ABLATION (N/A) LAPAROSCOPIC BILATERAL SALPINGECTOMY (Bilateral) SKIN TAG REMOVAL buttock/inner thigh (Left) as a surgical intervention .  The patient's history has been reviewed, patient examined, no change in status, stable for surgery.  I have reviewed the patient's chart and labs.  Questions were answered to the patient's satisfaction.     [name]

## 2012-12-01 NOTE — Anesthesia Preprocedure Evaluation (Signed)
Anesthesia Evaluation  Patient identified by MRN, date of birth, ID band Patient awake    Reviewed: Allergy & Precautions, H&P , NPO status , Patient's Chart, lab work & pertinent test results  Airway Mallampati: I TM Distance: >3 FB Neck ROM: Full    Dental  (+) Teeth Intact   Pulmonary shortness of breath and with exertion,  breath sounds clear to auscultation        Cardiovascular + dysrhythmias (PVC's) Rhythm:Regular Rate:Normal     Neuro/Psych  Headaches,  Neuromuscular disease    GI/Hepatic hiatal hernia, GERD-  ,  Endo/Other    Renal/GU      Musculoskeletal   Abdominal   Peds  Hematology   Anesthesia Other Findings   Reproductive/Obstetrics                           Anesthesia Physical Anesthesia Plan  ASA: II  Anesthesia Plan: General   Post-op Pain Management:    Induction: Intravenous  Airway Management Planned: Oral ETT  Additional Equipment:   Intra-op Plan:   Post-operative Plan: Extubation in OR  Informed Consent: I have reviewed the patients History and Physical, chart, labs and discussed the procedure including the risks, benefits and alternatives for the proposed anesthesia with the patient or authorized representative who has indicated his/her understanding and acceptance.     Plan Discussed with:   Anesthesia Plan Comments:         Anesthesia Quick Evaluation

## 2012-12-01 NOTE — Preoperative (Signed)
Beta Blockers   Reason not to administer Beta Blockers:Not Applicable 

## 2012-12-01 NOTE — Transfer of Care (Signed)
Immediate Anesthesia Transfer of Care Note  Patient: Meghan [name]  Procedure(s) Performed: Procedure(s): DILATATION & CURETTAGE;REMOVAL OF ENDOMETRIAL POLYP; HYSTEROSCOPY WITH THERMACHOICE ENDOMETRIAL ABLATION (N/A) LAPAROSCOPIC BILATERAL SALPINGECTOMY  (Bilateral) SKIN TAG REMOVAL (Left inner thigh)  (Left)  Patient Location: PACU  Anesthesia Type:General  Level of Consciousness: sedated and patient cooperative  Airway & Oxygen Therapy: Patient Spontanous Breathing and Patient connected to face mask oxygen  Post-op Assessment: Report given to PACU RN and Post -op Vital signs reviewed and stable  Post vital signs: Reviewed and stable  Complications: No apparent anesthesia complications

## 2012-12-01 NOTE — Anesthesia Procedure Notes (Signed)
Procedure Name: Intubation Date/Time: 12/01/2012 8:06 AM Performed by: Pernell Dupre, Gemma Ruan A Pre-anesthesia Checklist: Patient identified, Patient being monitored, Timeout performed, Emergency Drugs available and Suction available Patient Re-evaluated:Patient Re-evaluated prior to inductionOxygen Delivery Method: Circle System Utilized Preoxygenation: Pre-oxygenation with 100% oxygen Intubation Type: IV induction Ventilation: Mask ventilation without difficulty Laryngoscope Size: 3 and Miller Grade View: Grade I Tube type: Oral Tube size: 7.0 mm Number of attempts: 1 Airway Equipment and Method: stylet Placement Confirmation: ETT inserted through vocal cords under direct vision,  positive ETCO2 and breath sounds checked- equal and bilateral Secured at: 21 cm Tube secured with: Tape Dental Injury: Teeth and Oropharynx as per pre-operative assessment

## 2012-12-04 ENCOUNTER — Encounter (HOSPITAL_COMMUNITY): Payer: Self-pay | Admitting: Obstetrics and Gynecology

## 2012-12-08 ENCOUNTER — Encounter: Payer: Federal, State, Local not specified - PPO | Admitting: Obstetrics and Gynecology

## 2012-12-13 ENCOUNTER — Encounter (INDEPENDENT_AMBULATORY_CARE_PROVIDER_SITE_OTHER): Payer: Self-pay

## 2012-12-13 ENCOUNTER — Encounter: Payer: Self-pay | Admitting: Obstetrics and Gynecology

## 2012-12-13 ENCOUNTER — Ambulatory Visit (INDEPENDENT_AMBULATORY_CARE_PROVIDER_SITE_OTHER): Payer: Federal, State, Local not specified - PPO | Admitting: Obstetrics and Gynecology

## 2012-12-13 VITALS — BP 120/78 | Ht 69.0 in | Wt 267.0 lb

## 2012-12-13 DIAGNOSIS — Z9889 Other specified postprocedural states: Secondary | ICD-10-CM

## 2012-12-13 NOTE — Patient Instructions (Signed)
Motrin prn discomfort.

## 2012-12-13 NOTE — Progress Notes (Signed)
Patient ID: Meghan [name], female   DOB: 02-14-1970, 42 y.o.   MRN: [id number] Pt here for post op, 12/01/12. Pt states that she is still having bloating, right LQ pain more than usual and some cramping. Pt states that the bloating is terrible and that she is still bleeding.   Assessment:  Post-Op status post operative hysteroscopy and salpingectomy bilateral for requested sterilization and bleeding.2 weeks ago.    Plan:  1. Continue any current medications. 2. Wound care discussed. 3. Activity restrictions: none 4. Anticipated return to work: not applicable.at work since [employer] after surgery 5. Follow up in 1 year.  Subjective:  Meghan [name] is a 42 y.o. female .  She has been eating a regular diet without difficulty.  Bowel movements are normal. The patient is not having any pain. Doing well postoperatively. X for bloated sensation, no fever n, v. Decreased appetite.   Review of Systems Negative except as per HPI   Objective:  BP 120/78  Ht 5\' 9"  (1.753 m)  Wt 267 lb (121.11 kg)  BMI 39.41 kg/m2  LMP 11/17/2012 General:Well developed, well nourished.  No acute distress. Abdomen: Bowel sounds normal, soft, non-tender. Pelvic Exam: External Genitalia:  Normal. Vagina: Normal Bimanual: Normal Cervix: nonpurulentNormal Uterus: minimal tendernessNormal Adnexa: Normal Incision(s):Healing well, no drainage, no erythema, no hernia, no swelling, no dehiscence, incision well approximated.

## 2012-12-27 ENCOUNTER — Encounter: Payer: Self-pay | Admitting: Obstetrics and Gynecology

## 2013-01-01 ENCOUNTER — Encounter: Payer: Self-pay | Admitting: Adult Health

## 2013-01-01 ENCOUNTER — Ambulatory Visit (INDEPENDENT_AMBULATORY_CARE_PROVIDER_SITE_OTHER): Payer: Federal, State, Local not specified - PPO | Admitting: Adult Health

## 2013-01-01 VITALS — BP 122/70 | Ht 69.0 in | Wt 269.0 lb

## 2013-01-01 DIAGNOSIS — N926 Irregular menstruation, unspecified: Secondary | ICD-10-CM | POA: Insufficient documentation

## 2013-01-01 HISTORY — DX: Irregular menstruation, unspecified: N92.6

## 2013-01-01 MED ORDER — DOXYCYCLINE HYCLATE 100 MG PO TABS: 100.0000 mg | ORAL_TABLET | Freq: Two times a day (BID) | ORAL | Status: DC

## 2013-01-01 NOTE — Patient Instructions (Signed)
Take doxy 1 bid x 14 days

## 2013-01-01 NOTE — Progress Notes (Signed)
Subjective:     Patient ID: Meghan [name], female   DOB: [birth year], 42 y.o.   MRN: [id number]  HPI Meghan [name] is a 42 year old white female who had an ablation about 4 weeks ago and has had bleeding every day and ovary hurts at times.Takes percocet.   Review of Systems See HPI Reviewed past medical,surgical, social and family history. Reviewed medications and allergies.     Objective:   Physical Exam BP 122/70  Ht 5\' 9"  (1.753 m)  Wt 269 lb (122.018 kg)  BMI 39.71 kg/m2   Skin warm and dry.Pelvic: external genitalia is normal in appearance, vagina: bloody discharge without odor, cervix:smooth and bulbous, uterus: normal size, shape and contour, non tender, no masses felt, adnexa: no masses or tenderness noted.Discussed with Dr Despina Hidden, discussed with pt that this was an inflammation due to the ablation and will treat with doxycycline  Assessment:    Irregular bleeding after ablation     Plan:     Rx doxycycline 100 mg `1 bid x 14 days #28 no refills Follow up 01/17/13.

## 2013-01-04 ENCOUNTER — Encounter: Payer: Self-pay | Admitting: Obstetrics and Gynecology

## 2013-01-05 ENCOUNTER — Encounter: Payer: Self-pay | Admitting: Obstetrics and Gynecology

## 2013-01-08 ENCOUNTER — Encounter: Payer: Self-pay | Admitting: Adult Health

## 2013-01-15 ENCOUNTER — Ambulatory Visit: Payer: Federal, State, Local not specified - PPO | Admitting: Adult Health

## 2013-06-27 ENCOUNTER — Other Ambulatory Visit (HOSPITAL_COMMUNITY): Payer: Self-pay | Admitting: Internal Medicine

## 2013-06-27 DIAGNOSIS — R200 Anesthesia of skin: Secondary | ICD-10-CM

## 2013-06-27 DIAGNOSIS — R519 Headache, unspecified: Secondary | ICD-10-CM

## 2013-06-27 DIAGNOSIS — R930 Abnormal findings on diagnostic imaging of skull and head, not elsewhere classified: Secondary | ICD-10-CM

## 2013-06-27 DIAGNOSIS — G8929 Other chronic pain: Secondary | ICD-10-CM

## 2013-06-27 DIAGNOSIS — R51 Headache: Secondary | ICD-10-CM

## 2013-06-28 ENCOUNTER — Ambulatory Visit (HOSPITAL_COMMUNITY)
Admission: RE | Admit: 2013-06-28 | Discharge: 2013-06-28 | Disposition: A | Discharge status: Home / Self Care | Payer: Federal, State, Local not specified - PPO | Source: Ambulatory Visit | Attending: Internal Medicine | Admitting: Internal Medicine

## 2013-06-28 DIAGNOSIS — R519 Headache, unspecified: Secondary | ICD-10-CM

## 2013-06-28 DIAGNOSIS — R200 Anesthesia of skin: Secondary | ICD-10-CM

## 2013-06-28 DIAGNOSIS — R209 Unspecified disturbances of skin sensation: Secondary | ICD-10-CM | POA: Insufficient documentation

## 2013-06-28 DIAGNOSIS — R51 Headache: Secondary | ICD-10-CM | POA: Insufficient documentation

## 2013-06-28 DIAGNOSIS — G9389 Other specified disorders of brain: Secondary | ICD-10-CM | POA: Insufficient documentation

## 2013-06-28 DIAGNOSIS — R930 Abnormal findings on diagnostic imaging of skull and head, not elsewhere classified: Secondary | ICD-10-CM

## 2013-06-28 DIAGNOSIS — R42 Dizziness and giddiness: Secondary | ICD-10-CM | POA: Insufficient documentation

## 2013-06-28 DIAGNOSIS — G8929 Other chronic pain: Secondary | ICD-10-CM

## 2013-06-28 MED ORDER — GADOBENATE DIMEGLUMINE 529 MG/ML IV SOLN
20.0000 mL | Freq: Once | INTRAVENOUS | Status: AC | PRN
  Administered 2013-06-28: 20 mL via INTRAVENOUS

## 2013-08-01 ENCOUNTER — Encounter: Payer: Self-pay | Admitting: Neurology

## 2013-08-01 ENCOUNTER — Ambulatory Visit (INDEPENDENT_AMBULATORY_CARE_PROVIDER_SITE_OTHER): Payer: Federal, State, Local not specified - PPO | Admitting: Neurology

## 2013-08-01 VITALS — BP 120/82 | HR 83 | Resp 16 | Ht 68.5 in | Wt 273.0 lb

## 2013-08-01 DIAGNOSIS — R9089 Other abnormal findings on diagnostic imaging of central nervous system: Secondary | ICD-10-CM

## 2013-08-01 DIAGNOSIS — R930 Abnormal findings on diagnostic imaging of skull and head, not elsewhere classified: Secondary | ICD-10-CM

## 2013-08-01 DIAGNOSIS — G43009 Migraine without aura, not intractable, without status migrainosus: Secondary | ICD-10-CM

## 2013-08-01 DIAGNOSIS — R208 Other disturbances of skin sensation: Secondary | ICD-10-CM

## 2013-08-01 DIAGNOSIS — R209 Unspecified disturbances of skin sensation: Secondary | ICD-10-CM

## 2013-08-01 MED ORDER — BACLOFEN 10 MG PO TABS
ORAL_TABLET | ORAL | Status: DC
Start: 2013-08-01 — End: 2013-08-29

## 2013-08-01 NOTE — Progress Notes (Signed)
[facility]  Provider:  Larey Seat, M D  Referring Provider: Delphina Cahill, MD Primary Care Physician:  Doree Albee, MD  Chief Complaint  Patient presents with  . New Evaluation    Room 11  . Headache    HPI:  Meghan [name] is a 43 y.o. female , who  is seen here as a referral from Dr. Anastasio Champion for headaches ,   Meghan [name] works as a Biochemist, clinical. She has a history of headaches that respond to Xanax.  Her migraines are left sided dominant and associated with right arm numbness and a pressure sensation that radiated from the retro-orbit to the left neck and scapula. Never the other way around.  Always  associated wit nausea,  phono- and photo phobia, no aura, and no status. Sometime stress seems to bring the migraines on, she clenches her jaw , too . No menstrual effect and weather changes are not triggers, no food riggers and sleep deprivation is not a trigger , either.   She had seen Dr. Erling Cruz in 2003 and had an brain MRI - documented  unspecific lesions at the time. The right arm numbness is new since than than and she has 2 numb toes on the right foot.   The patient spoke to Dr. Anastasio Champion  , who  referred her here for a complete work up. She had a protocol  calibration MRI at the local hospital, allowing her to obtain a brain scan.  This report decribed white matter lesions and was performed with gadolinium.  There was no acute infarct or hemorrhage but there were a small full side of T2 hyperintensities in the cerebral white matter both hemispheres are affected, predominantly subcortical and mostly in the frontal. The number had increased over the last 12 years. The lesions are not periventricularly located or infratentorial and there is no abnormal corpus callosum lesion. There therefore not in the typical distribution for multiple sclerosis but could indicate a vasculitic change. This distribution can also be seen in people with migraines.  She has had a  complete spine MRI, no major abnormalities, here, especially minimal bulging discs.     Review of Systems:  Out of a complete 14 system review, the patient complains of only the following symptoms, and all other reviewed systems are negative. Arm numbness, migraines with left facial numbness.   History   Social History  . Marital Status: Married    Spouse Name: Keenan Bachelor    Number of Children: 2  . Years of Education: 14   Occupational History  . 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 [place]   Social History Main Topics  . Smoking status: Never Smoker   . Smokeless tobacco: Never Used  . Alcohol Use: No     Comment: rarely- 2 drinks per year  . Drug Use: No  . Sexual Activity: Yes    Birth Control/ Protection: None, Other-see comments     Comment: vasectomy   Other Topics Concern  . Not on file   Social History Narrative   Patient is married Keenan Bachelor) and lives at home with her husband and two children.   Patient works full-time at [employer].   Patient has a Associates degree.   Patient is right-handed.   Patient drinks tea occasionally.          Family History  Problem Relation Age of Onset  . Colon cancer Neg Hx   .  Cancer Maternal Grandmother     ovarian cancer  . Diabetes Maternal Grandfather   . Other Sister     Transverse myelitis  . Scleroderma Mother   . Hypertension Father   . Stroke Paternal Grandmother   . Hypertension Paternal Grandmother     Past Medical History  Diagnosis Date  . PVC (premature ventricular contraction)   . Chest pain, unspecified   . Dyspnea   . Palpitations   . Obesity, unspecified   . Colitis     see psh for colonoscopy reports  . IBS (irritable bowel syndrome)   . Nephrolithiasis   . Generalized headaches   . Enlarged lymph node     right side - arm  . Abdominal pain     RLQ  . Family history of ovarian cancer     MGM  . Nausea   . Hiatal hernia   . IBS (irritable bowel syndrome)   .  Menorrhagia 10/17/2012  . Endometrial polyp 11/09/2012  . Irregular bleeding 01/01/2013    Past Surgical History  Procedure Laterality Date  . Kidney stone surgery    . Knee arthroscopy  2008    ACL repair  . Colonoscopy with ileoscopy  2008    Dr. Laural Golden. patch of coarse mucosa at TI, bx  neg.  Random colon bx showed minimal active focal colitis ?resolving infection. Felt not c/w IBD.  Marland Kitchen Colonoscopy  2005    Dr. Irving Shows. Colitis of cecum and ICV, bx nonspecific  . Esophagogastroduodenoscopy  02/2008    Dr. Gala Romney- birnak appearing tubular esophagus. small hiatal hernia, o/w normal stomach, D1, D2  . Dilitation & currettage/hystroscopy with thermachoice ablation N/A 12/01/2012    Procedure: DILATATION & CURETTAGE;REMOVAL OF ENDOMETRIAL POLYP; HYSTEROSCOPY WITH THERMACHOICE ENDOMETRIAL ABLATION;  Surgeon: Jonnie Kind, MD;  Location: [facility];  Service: Gynecology;  Laterality: N/A;  . Laparoscopic bilateral salpingectomy Bilateral 12/01/2012    Procedure: LAPAROSCOPIC BILATERAL SALPINGECTOMY ;  Surgeon: Jonnie Kind, MD;  Location: [facility];  Service: Gynecology;  Laterality: Bilateral;  . Lesion removal Left 12/01/2012    Procedure: SKIN TAG REMOVAL (Left inner thigh) ;  Surgeon: Jonnie Kind, MD;  Location: [facility];  Service: Gynecology;  Laterality: Left;  . Ablation      Uterine    Current Outpatient Prescriptions  Medication Sig Dispense Refill  . ALPRAZolam (XANAX) 0.5 MG tablet Take 0.5 mg by mouth at bedtime as needed. Sleep.      Marland Kitchen aspirin 325 MG tablet Take 650 mg by mouth daily as needed. For pain       . Bioflavonoid Products (BIOFLEX PO) Take 1 tablet by mouth as needed. For knees/joints      . Biotin 1 MG CAPS Take 1 capsule by mouth daily.      . Cholecalciferol (VITAMIN D-3) 5000 UNITS TABS Take 1 tablet by mouth daily.      Marland Kitchen HYDROcodone-acetaminophen (NORCO/VICODIN) 5-325 MG per tablet As needed      . naproxen (NAPROSYN) 500 MG tablet Take 500 mg by mouth. As  needed       Current Facility-Administered Medications  Medication Dose Route Frequency Provider Last Rate Last Dose  . bisacodyl (DULCOLAX) suppository 10 mg  10 mg Rectal Daily PRN Jonnie Kind, MD        Allergies as of 08/01/2013 - Review Complete 08/01/2013  Allergen Reaction Noted  . Oxycodone-acetaminophen Hives 12/01/2012  . Morphine Other (See Comments) 08/01/2013    Vitals: BP  120/82  Pulse 83  Resp 16  Ht 5' 8.5" (1.74 m)  Wt 273 lb (123.832 kg)  BMI 40.90 kg/m2 Last Weight:  Wt Readings from Last 1 Encounters:  08/01/13 273 lb (123.832 kg)   Last Height:   Ht Readings from Last 1 Encounters:  08/01/13 5' 8.5" (1.74 m)    Physical exam:  General: The patient is awake, alert and appears not in acute distress. The patient is well groomed. Head: Normocephalic, atraumatic. Neck is supple. Mallampati 2 , neck circumference: 17 inches. No TMJ  Cardiovascular:  Regular rate and rhythm , without  murmurs or carotid bruit, and without distended neck veins. Respiratory: Lungs are clear to auscultation. Skin:  Without evidence of edema, or rash Trunk: BMI is  elevated and patient  has normal posture.  Neurologic exam : The patient is awake and alert, oriented to place and time.  Memory subjective described as intact.  There is a normal attention span & concentration ability. Speech is fluent without dysarthria, dysphonia or aphasia. Mood and affect are appropriate.  Cranial nerves: Pupils are equal and briskly reactive to light. Funduscopic exam without  evidence of pallor or edema.  Extraocular movements  in vertical and horizontal planes intact and without nystagmus. Visual fields by finger perimetry are intact. Hearing to finger rub intact.  Facial sensation intact to fine touch. Facial motor strength is symmetric and tongue and uvula move midline.  Motor exam:    Normal tone and normal muscle bulk and symmetric normal strength in all extremities.  Sensory:   Fine touch, pinprick and vibration were tested in all extremities. Proprioception is tested in the upper extremities only. This was  normal.  Coordination: Rapid alternating movements in the fingers/hands is tested and normal. Finger-to-nose maneuver tested and normal without evidence of ataxia, dysmetria or tremor.  Gait and station: Patient walks without assistive device and is able to climb up to the exam table, she has a wider based gait and tandem gait is affected by her obesity. Normal Romberg. Deep tendon reflexes: in the  upper and lower extremities are symmetric and intact. Babinski maneuver response is downgoing.   Assessment:  After physical and neurologic examination, review of laboratory studies, imaging, neurophysiology testing and pre-existing records, assessment is   1) unexplained numbness in the right upper extremity, not following a dermatomall distribution. Feeling "heavy and asleep ".grip is weaker when this occurs.  2) history of migraines 3) obesity.     Plan:  Treatment plan and additional workup :  MRI brain was performed at [facility], 06-18-13 as a protocol study, the patient volunteered. I would suggest to obtain CSF to look for oligoclonal bands. The white matter lesions are unspecific , but there are now 8 lesions.

## 2013-08-01 NOTE — Patient Instructions (Signed)
Lumbar Puncture A lumbar puncture, or spinal tap, is a procedure in which a small amount of the fluid that surrounds the brain and spinal cord is removed and examined. The fluid is called the cerebrospinal fluid. This procedure may be done to:   Help diagnose various problems, such as meningitis, encephalitis, multiple sclerosis, and AIDS.   Remove fluid and relieve pressure that occurs with certain types of headaches.   Look for bleeding within the brain and spinal cord areas (central nervous system).   Place medicine into the spinal fluid.  LET [facility] CARE PROVIDER KNOW ABOUT:  Any allergies you have.  All medicines you are taking, including vitamins, herbs, eye drops, creams, and over-the-counter medicines.  Previous problems you or members of your family have had with the use of anesthetics.  Any blood disorders you have.  Previous surgeries you have had.  Medical conditions you have. RISKS AND COMPLICATIONS Generally, this is a safe procedure. However, as with any procedure, complications can occur. Possible complications include:   Spinal headache. This is a severe headache that occurs when there is a leak of spinal fluid. A spinal headache causes discomfort but is not dangerous. If it persists, another procedure may be done to treat the headache.  Bleeding. This most often occurs in people with bleeding disorders. These are disorders in which the blood does not clot normally.   Infection at the insertion site that can spread to the bone or spinal fluid.  Formation of a spinal cord tumor (rare).  Brain herniation or movement of the brain into the spinal cord (rare).  Inability to move (extremely rare). BEFORE THE PROCEDURE  You may have blood tests done. These tests can help tell how well your kidneys and liver are working. They can also show how well your blood clots.   If you take blood thinners (anticoagulant medicine), ask your health care provider if  and when you should stop taking them.   Your health care provider may order a CT scan of your brain.  Make arrangements for someone to drive you home after the procedure.  PROCEDURE  You will be positioned so that the spaces between the bones of the spine (vertebrae) are as wide as possible. This will make it easier to pass the needle into the spinal canal.  Depending on your age and size, you may lie on your side, curled up with your knees under your chin. Or, you may sit with your head resting on a pillow that is placed at waist level.  The skin covering the lower back (or lumbar region) will be cleaned.   The skin may be numbed with medicine.  You may be given pain medicine or a medicine to help you relax (sedative).  A small needle will be inserted in the skin until it enters the space that contains the spinal fluid. The needle will not enter the spinal cord.   The spinal fluid will be collected into tubes.   The needle will be withdrawn, and a bandage will be placed on the site.  AFTER THE PROCEDURE  You will remain lying down for 1 hour or for as long as your health care provider suggests.   The spinal fluid will be sent to a laboratory to be examined. The results of the examination may be available before you go home.  A test, called a culture, may be taken of the spinal fluid if your health care provider thinks you have an infection. If cultures  were taken for exam, the results will usually be available in a couple of days.  Document Released: 01/02/2000 Document Revised: 10/25/2012 Document Reviewed: 09/11/2012 [facility] Patient Information 2015 [place], [place]. This information is not intended to replace advice given to you by your health care provider. Make sure you discuss any questions you have with your health care provider.

## 2013-08-02 ENCOUNTER — Telehealth: Payer: Self-pay | Admitting: Neurology

## 2013-08-02 NOTE — Telephone Encounter (Signed)
[facility] called stating they need clarification on the Baclofen medication. They need to know how many times this medication is supposed to taken.

## 2013-08-02 NOTE — Telephone Encounter (Signed)
Please advise 

## 2013-08-02 NOTE — Telephone Encounter (Signed)
Pharmacist has dispensed Rx 1 tab po prn.

## 2013-08-03 ENCOUNTER — Telehealth: Payer: Self-pay | Admitting: Neurology

## 2013-08-03 NOTE — Telephone Encounter (Signed)
Patient returning Meghan [name] call from Tuesday.

## 2013-08-03 NOTE — Telephone Encounter (Signed)
Left a message to let the patient know that someone from [place] will be contacting her to schedule the Lumbar puncture.

## 2013-08-17 ENCOUNTER — Ambulatory Visit
Admission: RE | Admit: 2013-08-17 | Discharge: 2013-08-17 | Disposition: A | Discharge status: Home / Self Care | Payer: Federal, State, Local not specified - PPO | Source: Ambulatory Visit | Attending: Neurology | Admitting: Neurology

## 2013-08-17 VITALS — BP 105/58 | HR 68

## 2013-08-17 DIAGNOSIS — R202 Paresthesia of skin: Secondary | ICD-10-CM

## 2013-08-17 DIAGNOSIS — R9089 Other abnormal findings on diagnostic imaging of central nervous system: Secondary | ICD-10-CM

## 2013-08-17 DIAGNOSIS — G43009 Migraine without aura, not intractable, without status migrainosus: Secondary | ICD-10-CM

## 2013-08-17 DIAGNOSIS — R208 Other disturbances of skin sensation: Secondary | ICD-10-CM

## 2013-08-17 LAB — CSF CELL COUNT WITH DIFFERENTIAL
RBC Count, CSF: 0 cu mm
Tube #: 2
WBC, CSF: 0 cu mm (ref 0–5)

## 2013-08-17 LAB — GLUCOSE, CSF: GLUCOSE CSF: 49 mg/dL (ref 43–76)

## 2013-08-17 LAB — PROTEIN, CSF: TOTAL PROTEIN, CSF: 30 mg/dL (ref 15–45)

## 2013-08-17 NOTE — Progress Notes (Signed)
One tiger-topped tube of blood drawn from right [facility] space without difficulty for LP orders; site unremarkable.  jkl

## 2013-08-17 NOTE — Discharge Instructions (Signed)

## 2013-08-22 ENCOUNTER — Encounter: Payer: Self-pay | Admitting: Neurology

## 2013-08-22 LAB — OLIGOCLONAL BANDS, CSF + SERM

## 2013-08-23 NOTE — Telephone Encounter (Signed)
Noted  

## 2013-08-24 NOTE — Progress Notes (Signed)
Quick Note:  Left message that results for oligoclonal bands still pending, per Dr. Brett Fairy. Can go over results at 08-29-13 appointment. Told to call with further questions. ______

## 2013-08-29 ENCOUNTER — Encounter: Payer: Self-pay | Admitting: Adult Health

## 2013-08-29 ENCOUNTER — Ambulatory Visit (INDEPENDENT_AMBULATORY_CARE_PROVIDER_SITE_OTHER): Payer: Federal, State, Local not specified - PPO | Admitting: Adult Health

## 2013-08-29 VITALS — BP 119/79 | HR 80 | Ht 68.25 in | Wt 272.0 lb

## 2013-08-29 DIAGNOSIS — R208 Other disturbances of skin sensation: Secondary | ICD-10-CM

## 2013-08-29 DIAGNOSIS — R3989 Other symptoms and signs involving the genitourinary system: Secondary | ICD-10-CM

## 2013-08-29 DIAGNOSIS — G43009 Migraine without aura, not intractable, without status migrainosus: Secondary | ICD-10-CM

## 2013-08-29 DIAGNOSIS — R209 Unspecified disturbances of skin sensation: Secondary | ICD-10-CM

## 2013-08-29 DIAGNOSIS — R930 Abnormal findings on diagnostic imaging of skull and head, not elsewhere classified: Secondary | ICD-10-CM

## 2013-08-29 NOTE — Progress Notes (Signed)
PATIENT: Meghan [name] DOB: June 28, 1970  REASON FOR VISIT: follow up HISTORY FROM: patient  HISTORY OF PRESENT ILLNESS: Meghan [name] is a 43 year old female with a history of migraines. She returns today for follow-up. She normally takes Xanax for her headaches. Patient states she has had three headaches since the last visit. This is typically not normal for her, therefore she feels that her headaches may be due to the baclofen. She was started on baclofen for a squeezing sensation around her upper abdomen. She has been seen by a GI doctor regarding this sensation but everything was unremarkable except mention of a possible hiatal hernia according to the patient. She had an abnormal MRI that showed nonspecific white matter changes. She was set up for a LP. Lab work from the LP was unremarkable. Negative for oligoclonal bands. Patient reported numbness in the right arm and two toes on the right foot at her last visit. She states that this is still present. She describes this sensation has a "heaviness."  Denies any changes with her vision. Denies changes with bowel and bladder. Although she states that since she started baclofen she has noticed some intermittent pain when her bladder is palpated. She has also noticed some tenderness in the left flank area that comes and goes. She states that she does have a history of kidney stones. She confirms this pain is similar to what she has felt in the past with kidney stones. Denis any blood or odor in her urine.  Denies burning with urination. She actually feels relief with urination. No issues with ambulation and balance. Denies falls. Patient does report fatigue states that she feels exhausted.   HISTORY 08/01/13 (CD): Meghan [name] works as a Biochemist, clinical. She has a history of headaches that respond to Xanax. Her migraines are left sided dominant and associated with right arm numbness and a pressure sensation that radiated from the retro-orbit to the left  neck and scapula. Never the other way around. Always associated wit nausea, phono- and photo phobia, no aura, and no status.  Sometime stress seems to bring the migraines on, she clenches her jaw , too . No menstrual effect and weather changes are not triggers, no food riggers and sleep deprivation is not a trigger , either.  She had seen Dr. Erling Cruz in 2003 and had an brain MRI - documented unspecific lesions at the time. The right arm numbness is new since than than and she has 2 numb toes on the right foot.  The patient spoke to Dr. Anastasio Champion , who referred her here for a complete work up. She had a protocol calibration MRI at the local hospital, allowing her to obtain a brain scan.  This report decribed white matter lesions and was performed with gadolinium.  There was no acute infarct or hemorrhage but there were a small full side of T2 hyperintensities in the cerebral white matter both hemispheres are affected, predominantly subcortical and mostly in the frontal. The number had increased over the last 12 years. The lesions are not periventricularly located or infratentorial and there is no abnormal corpus callosum lesion. There therefore not in the typical distribution for multiple sclerosis but could indicate a vasculitic change. This distribution can also be seen in people with migraines.  She has had a complete spine MRI, no major abnormalities, here, especially minimal bulging discs.   REVIEW OF SYSTEMS: Full 14 system review of systems performed and notable only for:  Constitutional: Fatigue Eyes: N/A  Ear/Nose/Throat: Ringing in the ears Skin: N/A  Cardiovascular: Chest pain Respiratory: N/A  Gastrointestinal: N/A Genitourinary: Painful urination Hematology/Lymphatic: N/A  Endocrine: N/A Musculoskeletal: Joint pain, back pain, aching muscles, muscle cramps Allergy/Immunology: N/A  Neurological: Dizziness, headache, speech difficulty, weakness Psychiatric: N/A Sleep: Restless leg,  insomnia, acting out dreams   ALLERGIES: Allergies  Allergen Reactions  . Morphine Other (See Comments)    Neck pain  . Oxycodone-Acetaminophen Hives and Itching    Itching    HOME MEDICATIONS: Outpatient Prescriptions Prior to Visit  Medication Sig Dispense Refill  . ALPRAZolam (XANAX) 0.5 MG tablet Take 0.5 mg by mouth at bedtime as needed. Sleep.      Marland Kitchen aspirin 325 MG tablet Take 650 mg by mouth daily as needed. For pain       . baclofen (LIORESAL) 10 MG tablet Use prn for abdominal and spinal tightness  30 each  5  . Bioflavonoid Products (BIOFLEX PO) Take 1 tablet by mouth as needed. For knees/joints      . Biotin 1 MG CAPS Take 1 capsule by mouth daily.      . Cholecalciferol (VITAMIN D-3) 5000 UNITS TABS Take 1 tablet by mouth daily.      Marland Kitchen HYDROcodone-acetaminophen (NORCO/VICODIN) 5-325 MG per tablet As needed      . naproxen (NAPROSYN) 500 MG tablet Take 500 mg by mouth. As needed       Facility-Administered Medications Prior to Visit  Medication Dose Route Frequency Provider Last Rate Last Dose  . bisacodyl (DULCOLAX) suppository 10 mg  10 mg Rectal Daily PRN Jonnie Kind, MD        PAST MEDICAL HISTORY: Past Medical History  Diagnosis Date  . PVC (premature ventricular contraction)   . Chest pain, unspecified   . Dyspnea   . Palpitations   . Obesity, unspecified   . Colitis     see psh for colonoscopy reports  . IBS (irritable bowel syndrome)   . Nephrolithiasis   . Generalized headaches   . Enlarged lymph node     right side - arm  . Abdominal pain     RLQ  . Family history of ovarian cancer     MGM  . Nausea   . Hiatal hernia   . IBS (irritable bowel syndrome)   . Menorrhagia 10/17/2012  . Endometrial polyp 11/09/2012  . Irregular bleeding 01/01/2013    PAST SURGICAL HISTORY: Past Surgical History  Procedure Laterality Date  . Kidney stone surgery    . Knee arthroscopy  2008    ACL repair  . Colonoscopy with ileoscopy  2008    Dr. Laural Golden.  patch of coarse mucosa at TI, bx  neg.  Random colon bx showed minimal active focal colitis ?resolving infection. Felt not c/w IBD.  Marland Kitchen Colonoscopy  2005    Dr. Irving Shows. Colitis of cecum and ICV, bx nonspecific  . Esophagogastroduodenoscopy  02/2008    Dr. Gala Romney- birnak appearing tubular esophagus. small hiatal hernia, o/w normal stomach, D1, D2  . Dilitation & currettage/hystroscopy with thermachoice ablation N/A 12/01/2012    Procedure: DILATATION & CURETTAGE;REMOVAL OF ENDOMETRIAL POLYP; HYSTEROSCOPY WITH THERMACHOICE ENDOMETRIAL ABLATION;  Surgeon: Jonnie Kind, MD;  Location: [facility];  Service: Gynecology;  Laterality: N/A;  . Laparoscopic bilateral salpingectomy Bilateral 12/01/2012    Procedure: LAPAROSCOPIC BILATERAL SALPINGECTOMY ;  Surgeon: Jonnie Kind, MD;  Location: [facility];  Service: Gynecology;  Laterality: Bilateral;  . Lesion removal Left 12/01/2012    Procedure: SKIN  TAG REMOVAL (Left inner thigh) ;  Surgeon: Jonnie Kind, MD;  Location: [facility];  Service: Gynecology;  Laterality: Left;  . Ablation      Uterine    FAMILY HISTORY: Family History  Problem Relation Age of Onset  . Colon cancer Neg Hx   . Cancer Maternal Grandmother     ovarian cancer  . Diabetes Maternal Grandfather   . Other Sister     Transverse myelitis  . Scleroderma Mother   . Hypertension Father   . Stroke Paternal Grandmother   . Hypertension Paternal Grandmother     SOCIAL HISTORY: History   Social History  . Marital Status: Married    Spouse Name: Keenan Bachelor    Number of Children: 2  . Years of Education: 14   Occupational History  . 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 [place]   Social History Main Topics  . Smoking status: Never Smoker   . Smokeless tobacco: Never Used  . Alcohol Use: No     Comment: rarely- 2 drinks per year  . Drug Use: No  . Sexual Activity: Yes    Birth Control/ Protection: None, Other-see comments     Comment: vasectomy   Other  Topics Concern  . Not on file   Social History Narrative   Patient is married Keenan Bachelor) and lives at home with her husband and two children.   Patient works full-time at [employer].   Patient has a Associates degree.   Patient is right-handed.   Patient drinks tea occasionally.            PHYSICAL EXAM  Filed Vitals:   08/29/13 1540  Height: 5' 8.25" (1.734 m)  Weight: 272 lb (123.378 kg)   Body mass index is 41.03 kg/(m^2).  Generalized: Well developed, in no acute distress   Neurological examination  Mentation: Alert oriented to time, place, history taking. Follows all commands speech and language fluent Cranial nerve II-XII: Pupils were equal round reactive to light. Extraocular movements were full, visual field were full on confrontational test. Facial sensation and strength were normal. hearing was intact to finger rubbing bilaterally. Uvula tongue midline. Head turning and shoulder shrug  were normal and symmetric. Motor: The motor testing reveals 5 over 5 strength of all 4 extremities. Good symmetric motor tone is noted throughout.  Sensory: Sensory testing is intact to soft touch on all 4 extremities. No evidence of extinction is noted.  Coordination: Cerebellar testing reveals good finger-nose-finger bilaterally.  Gait and station: Gait is normal. Tandem gait is normal. Romberg is negative. No drift is seen.  Reflexes: Deep tendon reflexes are symmetric and normal bilaterally.    DIAGNOSTIC DATA (LABS, IMAGING, TESTING) - I reviewed patient records, labs, notes, testing and imaging myself where available.  06/28/13 MRI brain: IMPRESSION:  Scattered foci of white matter T2 signal abnormality, reportedly  increased from 2003 and nonspecific. Considerations include small  vessel ischemia, sequelae of trauma, hypercoagulable state,  vasculitis, migraines, prior infection or demyelination.   Lab Results  Component Value Date   WBC 9.9 11/27/2012   HGB 13.8 11/27/2012    HCT 41.9 11/27/2012   MCV 84.5 11/27/2012   PLT 258 11/27/2012      Component Value Date/Time   NA 132* 10/24/2011 0924   K 3.8 10/24/2011 0924   CL 102 10/24/2011 0924   CO2 24 10/24/2011 0924   GLUCOSE 88 10/24/2011 0924   BUN 14 10/24/2011  5784   CREATININE 0.68 10/24/2011 0924   CALCIUM 8.9 10/24/2011 0924   PROT 6.7 10/24/2011 0924   ALBUMIN 3.4* 10/24/2011 0924   AST 33 10/24/2011 0924   ALT 18 10/24/2011 0924   ALKPHOS 69 10/24/2011 0924   BILITOT 0.4 10/24/2011 0924   GFRNONAA >90 10/24/2011 0924   GFRAA >90 10/24/2011 0924       ASSESSMENT AND PLAN 43 y.o. year old female  has a past medical history of PVC (premature ventricular contraction); Chest pain, unspecified; Dyspnea; Palpitations; Obesity, unspecified; Colitis; IBS (irritable bowel syndrome); Nephrolithiasis; Generalized headaches; Enlarged lymph node; Abdominal pain; Family history of ovarian cancer; Nausea; Hiatal hernia; IBS (irritable bowel syndrome); Menorrhagia (10/17/2012); Endometrial polyp (11/09/2012); and Irregular bleeding (01/01/2013). here with:  1. migraines 2. Abnormal MRI of the brain 3. Bladder pain 4. Dysesthesia of multiple sites  Patient has had abnormal MRI scans of the brain showing nonspecific white matter changes. She recently underwent a LP. All lab work was negative. Negative for oligo clonal bands. Patient does have some sensory changes in the right arm and right foot in two toes. The sensory changes are intermittent and she describes them as a clumsiness. She also describes a squeezing sensation around the upper abdomen. She was started on baclofen at the last visit which has proven to help the squeezing sensation. The concern is that with an abnormal MRI and her signs and symptoms this could be a presentation of multiple sclerosis. However the LP checking for Oligoclonal bands was negative. Today I will check additional blood work on the patient. We will continue to check MRIs over time as well as  monitor her for new signs and symptoms. The patient also complains today of bladder pain. The patient has a history of kidney stones. I will check a urinalysis and urine culture for infection. The patient should followup in one month or sooner if needed.   [name], MSN, NP-C 08/29/2013, 3:42 PM [facility] [address], [place], [place] [address] [phone]  Note: This document was prepared with digital dictation and possible smart phrase technology. Any transcriptional errors that result from this process are unintentional.

## 2013-08-29 NOTE — Patient Instructions (Signed)
Multiple Sclerosis Multiple sclerosis (MS) is a disease of the central nervous system. It leads to the loss of the insulating covering of the nerves (myelin sheath) of your brain. When this happens, brain signals do not get sent properly or may not get sent at all. The age of onset of MS varies.  CAUSES The cause of MS is unknown. However, it is more common in the [place] than in the [place]. RISK FACTORS There is a higher number of women with MS than men. MS is not an illness that is passed down to you from your family members (inherited). However, your risk of MS is higher if you have a relative with MS. SIGNS AND SYMPTOMS  The symptoms of MS occur in episodes or attacks. These attacks may last weeks to months. There may be long periods of almost no symptoms between attacks. The symptoms of MS vary. This is because of the many different ways it affects the central nervous system. The main symptoms of MS include:  Vision problems and eye pain.  Numbness.  Weakness.  Inability to move your arms, hands, feet, or legs (paralysis).  Balance problems.  Tremors. DIAGNOSIS  Your health care provider can diagnose MS with the help of imaging exams and lab tests. These may include specialized X-ray exams and spinal fluid tests. The best imaging exam to confirm a diagnosis of MS is an MRI. TREATMENT  There is no known cure for MS, but there are medicines that can decrease the number and frequency of attacks. Steroids are often used for short-term relief. Physical and occupational therapy may also help. There are also many new alternative or complementary treatments available to help control the symptoms of MS. Ask your health care provider if any of these other options are right for you. HOME CARE INSTRUCTIONS   Take medicines as directed by your health care provider.  Exercise as directed by your health care provider. SEEK MEDICAL CARE IF: You begin to feel  depressed. SEEK IMMEDIATE MEDICAL CARE IF:  You develop paralysis.  You have problems with bladder, bowel, or sexual function.  You develop mental changes, such as forgetfulness or mood swings.  You have a period of uncontrolled movements (seizure). Document Released: 01/02/2000 Document Revised: 01/09/2013 Document Reviewed: 09/11/2012 [facility] Patient Information 2015 [place], [place]. This information is not intended to replace advice given to you by your health care provider. Make sure you discuss any questions you have with your health care provider.   Migraine Headache A migraine headache is an intense, throbbing pain on one or both sides of your head. A migraine can last for 30 minutes to several hours. CAUSES  The exact cause of a migraine headache is not always known. However, a migraine may be caused when nerves in the brain become irritated and release chemicals that cause inflammation. This causes pain. Certain things may also trigger migraines, such as:  Alcohol.  Smoking.  Stress.  Menstruation.  Aged cheeses.  Foods or drinks that contain nitrates, glutamate, aspartame, or tyramine.  Lack of sleep.  Chocolate.  Caffeine.  Hunger.  Physical exertion.  Fatigue.  Medicines used to treat chest pain (nitroglycerine), birth control pills, estrogen, and some blood pressure medicines. SIGNS AND SYMPTOMS  Pain on one or both sides of your head.  Pulsating or throbbing pain.  Severe pain that prevents daily activities.  Pain that is aggravated by any physical activity.  Nausea, vomiting, or both.  Dizziness.  Pain with exposure  to bright lights, loud noises, or activity.  General sensitivity to bright lights, loud noises, or smells. Before you get a migraine, you may get warning signs that a migraine is coming (aura). An aura may include:  Seeing flashing lights.  Seeing bright spots, halos, or zigzag lines.  Having tunnel vision or blurred  vision.  Having feelings of numbness or tingling.  Having trouble talking.  Having muscle weakness. DIAGNOSIS  A migraine headache is often diagnosed based on:  Symptoms.  Physical exam.  A CT scan or MRI of your head. These imaging tests cannot diagnose migraines, but they can help rule out other causes of headaches. TREATMENT Medicines may be given for pain and nausea. Medicines can also be given to help prevent recurrent migraines.  HOME CARE INSTRUCTIONS  Only take over-the-counter or prescription medicines for pain or discomfort as directed by your health care provider. The use of long-term narcotics is not recommended.  Lie down in a dark, quiet room when you have a migraine.  Keep a journal to find out what may trigger your migraine headaches. For example, write down:  What you eat and drink.  How much sleep you get.  Any change to your diet or medicines.  Limit alcohol consumption.  Quit smoking if you smoke.  Get 7-9 hours of sleep, or as recommended by your health care provider.  Limit stress.  Keep lights dim if bright lights bother you and make your migraines worse. SEEK IMMEDIATE MEDICAL CARE IF:   Your migraine becomes severe.  You have a fever.  You have a stiff neck.  You have vision loss.  You have muscular weakness or loss of muscle control.  You start losing your balance or have trouble walking.  You feel faint or pass out.  You have severe symptoms that are different from your first symptoms. MAKE SURE YOU:   Understand these instructions.  Will watch your condition.  Will get help right away if you are not doing well or get worse. Document Released: 01/04/2005 Document Revised: 05/21/2013 Document Reviewed: 09/11/2012 [facility] Patient Information 2015 [place], [place]. This information is not intended to replace advice given to you by your health care provider. Make sure you discuss any questions you have with your health care  provider.

## 2013-08-30 NOTE — Progress Notes (Signed)
I agree with the assessment and plan as directed by NP .The patient is known to me .  I will ask a MS specialist for a second opinion , will refer to Dr. Chauncey Cruel for October-November.    Tish Begin, MD

## 2013-08-30 NOTE — Progress Notes (Signed)
I agree with the assessment and plan as directed by NP .The patient is known to me .   Meghan Greenley, MD  

## 2013-08-31 ENCOUNTER — Other Ambulatory Visit (INDEPENDENT_AMBULATORY_CARE_PROVIDER_SITE_OTHER): Payer: Self-pay

## 2013-08-31 DIAGNOSIS — Z0289 Encounter for other administrative examinations: Secondary | ICD-10-CM

## 2013-09-01 LAB — URINE CULTURE

## 2013-09-03 LAB — URINALYSIS
BILIRUBIN UA: NEGATIVE
Glucose, UA: NEGATIVE
Ketones, UA: NEGATIVE
LEUKOCYTES UA: NEGATIVE
Nitrite, UA: NEGATIVE
PH UA: 6.5 (ref 5.0–7.5)
PROTEIN UA: NEGATIVE
RBC, UA: NEGATIVE
Specific Gravity, UA: 1.028 (ref 1.005–1.030)
Urobilinogen, Ur: 0.2 mg/dL (ref 0.0–1.9)

## 2013-09-03 LAB — ENA+DNA/DS+SJORGEN'S
ENA SSA (RO) Ab: <0.2 AI (ref 0.0–0.9)
ENA SSB (LA) Ab: <0.2 AI (ref 0.0–0.9)
dsDNA Ab: 1 IU/mL (ref 0–9)

## 2013-09-03 LAB — SEDIMENTATION RATE: Sed Rate: 2 mm/hr (ref 0–32)

## 2013-09-03 LAB — C-REACTIVE PROTEIN: CRP: 4 mg/L (ref 0.0–4.9)

## 2013-09-03 LAB — LYME, TOTAL AB TEST/REFLEX: Lyme IgG/IgM Ab: <0.91 {ISR} (ref 0.00–0.90)

## 2013-09-03 LAB — ANA W/REFLEX: Anti Nuclear Antibody(ANA): POSITIVE — AB

## 2013-09-03 LAB — RHEUMATOID FACTOR: Rhuematoid fact SerPl-aCnc: 7 IU/mL (ref 0.0–13.9)

## 2013-09-04 ENCOUNTER — Telehealth: Payer: Self-pay | Admitting: Adult Health

## 2013-09-04 NOTE — Telephone Encounter (Signed)
Patient returning call to [facility] regarding lab results, please call patient back and advise.

## 2013-09-04 NOTE — Telephone Encounter (Signed)
I called the patient regarding lab results. I left a voice mail for her to call our office.

## 2013-09-04 NOTE — Telephone Encounter (Signed)
I called the patient and reviewed her lab results. Her ANA was positive but the rest of her labs were unremarkable. I recommended that we recheck this lab in 4-5 months if still positive then we would refer her to rheumatology. I also gave her the option for [place] to go ahead and refer her. At this time she wants to get the ANA rechecked in 4-5 months and go from there. I also spoke to Dr. Melissa Noon nurse and she gave the same recommendations. If we had a high suspicion that the patient had an autoimmune disease then we could refer her, if not just continue to monitor the ANA  and the patient for new symptoms.

## 2013-09-13 ENCOUNTER — Telehealth: Payer: Self-pay | Admitting: Neurology

## 2013-09-13 NOTE — Telephone Encounter (Signed)
Spoke to patient and she was aware that CSF result was negative for classic MS, no oligoclonal bands, per Dr. Brett Fairy.

## 2013-10-01 ENCOUNTER — Ambulatory Visit: Payer: Federal, State, Local not specified - PPO | Admitting: Adult Health

## 2013-10-08 ENCOUNTER — Encounter: Payer: Self-pay | Admitting: Adult Health

## 2013-10-15 ENCOUNTER — Encounter: Payer: Self-pay | Admitting: Adult Health

## 2013-11-02 ENCOUNTER — Other Ambulatory Visit: Payer: Self-pay

## 2013-11-03 ENCOUNTER — Encounter: Payer: Self-pay | Admitting: Adult Health

## 2013-11-07 ENCOUNTER — Telehealth: Payer: Self-pay | Admitting: *Deleted

## 2013-11-07 NOTE — Telephone Encounter (Signed)
Please advise note? 

## 2013-11-08 NOTE — Telephone Encounter (Signed)
I tried calling the patient was unable to leave a voicemail due to her mailbox being full. She needs to follow-up in 4 months (December) with me. At that visit I will redraw the ANA.

## 2013-11-19 ENCOUNTER — Encounter: Payer: Self-pay | Admitting: Adult Health

## 2013-12-19 ENCOUNTER — Other Ambulatory Visit (INDEPENDENT_AMBULATORY_CARE_PROVIDER_SITE_OTHER): Payer: Self-pay | Admitting: Surgery

## 2013-12-20 ENCOUNTER — Encounter: Payer: Self-pay | Admitting: Adult Health

## 2013-12-20 NOTE — Telephone Encounter (Signed)
Found cancellation in Megan's schedule this month, called patient and advised of appt on 12-28-13 @0930 , please arrive by 0915.

## 2013-12-20 NOTE — Telephone Encounter (Signed)
Legrand Como, can you get this patient scheduled with Jinny Blossom sometime this month if she has anything? Thanks

## 2013-12-21 ENCOUNTER — Other Ambulatory Visit (HOSPITAL_COMMUNITY): Payer: Self-pay | Admitting: Internal Medicine

## 2013-12-21 ENCOUNTER — Ambulatory Visit (HOSPITAL_COMMUNITY)
Admission: RE | Admit: 2013-12-21 | Discharge: 2013-12-21 | Disposition: A | Discharge status: Home / Self Care | Payer: Federal, State, Local not specified - PPO | Source: Ambulatory Visit | Attending: Internal Medicine | Admitting: Internal Medicine

## 2013-12-21 DIAGNOSIS — R1032 Left lower quadrant pain: Secondary | ICD-10-CM | POA: Diagnosis present

## 2013-12-21 DIAGNOSIS — R109 Unspecified abdominal pain: Secondary | ICD-10-CM

## 2013-12-21 DIAGNOSIS — D259 Leiomyoma of uterus, unspecified: Secondary | ICD-10-CM | POA: Diagnosis not present

## 2013-12-21 DIAGNOSIS — R52 Pain, unspecified: Secondary | ICD-10-CM

## 2013-12-21 MED ORDER — IOHEXOL 300 MG/ML  SOLN
100.0000 mL | Freq: Once | INTRAMUSCULAR | Status: AC | PRN
  Administered 2013-12-21: 100 mL via INTRAVENOUS

## 2013-12-28 ENCOUNTER — Encounter: Payer: Self-pay | Admitting: Adult Health

## 2013-12-28 ENCOUNTER — Ambulatory Visit (INDEPENDENT_AMBULATORY_CARE_PROVIDER_SITE_OTHER): Payer: Federal, State, Local not specified - PPO | Admitting: Adult Health

## 2013-12-28 VITALS — BP 130/82 | HR 81 | Temp 97.4°F | Ht 68.25 in | Wt 279.0 lb

## 2013-12-28 DIAGNOSIS — G43119 Migraine with aura, intractable, without status migrainosus: Secondary | ICD-10-CM

## 2013-12-28 DIAGNOSIS — R930 Abnormal findings on diagnostic imaging of skull and head, not elsewhere classified: Secondary | ICD-10-CM

## 2013-12-28 DIAGNOSIS — R208 Other disturbances of skin sensation: Secondary | ICD-10-CM

## 2013-12-28 MED ORDER — NORTRIPTYLINE HCL 10 MG PO CAPS: 10.0000 mg | ORAL_CAPSULE | Freq: Every day | ORAL | Status: DC

## 2013-12-28 NOTE — Progress Notes (Signed)
PATIENT: Meghan [name] DOB: 01/13/1971  REASON FOR VISIT: follow up HISTORY FROM: patient   HISTORY OF PRESENT ILLNESS:  Ms. [name] is a 43 year old female with a history of migraines and abnormal brain MRI. She returns today for follow-up. The patient states she normally takes Xanax for headaches. Since her last visit she has had 2 headaches per week. Her headaches are normally located on the left side. She has some tingling on the left side of her head. She has been having an aura- she states that it will be round objects that will appear in her vision and normally a headache will occur afterwards. Confirms photophobia and denies phonophobia. She normally has to get in a dark room for her headache to subside.  She continues to take baclofen for a squeezing sensation around her upper abdomen. She was taking it daily but now is taking it PRN for the squeezing sensation. She was having in flank pain and saw her PCP. They did a CT of the kidneys that was unremarkable. She is still having this pain.  She reports that this is working well for her. She continues to have sensory changes in the right and left arm and the right foot in two toes that are intermittent. Denies any new symptoms.   HISTORY 08/29/13: 43 year old female with a history of migraines. She returns today for follow-up. She normally takes Xanax for her headaches. Patient states she has had three headaches since the last visit. This is typically not normal for her, therefore she feels that her headaches may be due to the baclofen. She was started on baclofen for a squeezing sensation around her upper abdomen. She has been seen by a GI doctor regarding this sensation but everything was unremarkable except mention of a possible hiatal hernia according to the patient. She had an abnormal MRI that showed nonspecific white matter changes. She was set up for a LP. Lab work from the LP was unremarkable. Negative for oligoclonal bands. Patient  reported numbness in the right arm and two toes on the right foot at her last visit. She states that this is still present. She describes this sensation has a "heaviness."  Denies any changes with her vision. Denies changes with bowel and bladder. Although she states that since she started baclofen she has noticed some intermittent pain when her bladder is palpated. She has also noticed some tenderness in the left flank area that comes and goes. She states that she does have a history of kidney stones. She confirms this pain is similar to what she has felt in the past with kidney stones. Denis any blood or odor in her urine.  Denies burning with urination. She actually feels relief with urination. No issues with ambulation and balance. Denies falls. Patient does report fatigue states that she feels exhausted.   HISTORY 08/01/13 (CD): Meghan [name] works as a Biochemist, clinical. She has a history of headaches that respond to Xanax. Her migraines are left sided dominant and associated with right arm numbness and a pressure sensation that radiated from the retro-orbit to the left neck and scapula. Never the other way around. Always associated wit nausea, phono- and photo phobia, no aura, and no status.  Sometime stress seems to bring the migraines on, she clenches her jaw , too . No menstrual effect and weather changes are not triggers, no food riggers and sleep deprivation is not a trigger , either.  She had seen Dr. Erling Cruz in 2003  and had an brain MRI - documented unspecific lesions at the time. The right arm numbness is new since than than and she has 2 numb toes on the right foot.  The patient spoke to Dr. Anastasio Champion , who referred her here for a complete work up. She had a protocol calibration MRI at the local hospital, allowing her to obtain a brain scan.  This report decribed white matter lesions and was performed with gadolinium.  There was no acute infarct or hemorrhage but there were a small full side of T2  hyperintensities in the cerebral white matter both hemispheres are affected, predominantly subcortical and mostly in the frontal. The number had increased over the last 12 years. The lesions are not periventricularly located or infratentorial and there is no abnormal corpus callosum lesion. There therefore not in the typical distribution for multiple sclerosis but could indicate a vasculitic change. Thisdistribution can also be seen in people with migraines. She has had a complete spine MRI, no major abnormalities, here, especially minimal bulging discs.  REVIEW OF SYSTEMS: Out of a complete 14 system review of symptoms, the patient complains only of the following symptoms, and all other reviewed systems are negative.  Headache Fatigue  ALLERGIES: Allergies  Allergen Reactions  . Morphine Other (See Comments)    Neck pain  . Oxycodone-Acetaminophen Hives and Itching    Itching    HOME MEDICATIONS: Outpatient Prescriptions Prior to Visit  Medication Sig Dispense Refill  . ALPRAZolam (XANAX) 0.5 MG tablet Take 0.5 mg by mouth at bedtime as needed. Sleep.    . baclofen (LIORESAL) 10 MG tablet Take 10 mg by mouth daily as needed. Use prn for abdominal and spinal tightness    . Biotin 1 MG CAPS Take 1 capsule by mouth daily.    . Cholecalciferol (VITAMIN D-3) 5000 UNITS TABS Take 1 tablet by mouth daily.    . naproxen (NAPROSYN) 500 MG tablet Take 500 mg by mouth. As needed    . aspirin 325 MG tablet Take 650 mg by mouth daily as needed. For pain     . Bioflavonoid Products (BIOFLEX PO) Take 1 tablet by mouth as needed. For knees/joints    . HYDROcodone-acetaminophen (NORCO/VICODIN) 5-325 MG per tablet As needed    . omeprazole (PRILOSEC) 20 MG capsule Take 20 mg by mouth daily.     No facility-administered medications prior to visit.    PAST MEDICAL HISTORY: Past Medical History  Diagnosis Date  . PVC (premature ventricular contraction)   . Chest pain, unspecified   . Dyspnea   .  Palpitations   . Obesity, unspecified   . Colitis     see psh for colonoscopy reports  . IBS (irritable bowel syndrome)   . Nephrolithiasis   . Generalized headaches   . Enlarged lymph node     right side - arm  . Abdominal pain     RLQ  . Family history of ovarian cancer     MGM  . Nausea   . Hiatal hernia   . IBS (irritable bowel syndrome)   . Menorrhagia 10/17/2012  . Endometrial polyp 11/09/2012  . Irregular bleeding 01/01/2013    PAST SURGICAL HISTORY: Past Surgical History  Procedure Laterality Date  . Kidney stone surgery    . Knee arthroscopy  2008    ACL repair  . Colonoscopy with ileoscopy  2008    Dr. Laural Golden. patch of coarse mucosa at TI, bx  neg.  Random colon bx showed minimal active  focal colitis ?resolving infection. Felt not c/w IBD.  Marland Kitchen Colonoscopy  2005    Dr. Irving Shows. Colitis of cecum and ICV, bx nonspecific  . Esophagogastroduodenoscopy  02/2008    Dr. Gala Romney- birnak appearing tubular esophagus. small hiatal hernia, o/w normal stomach, D1, D2  . Dilitation & currettage/hystroscopy with thermachoice ablation N/A 12/01/2012    Procedure: DILATATION & CURETTAGE;REMOVAL OF ENDOMETRIAL POLYP; HYSTEROSCOPY WITH THERMACHOICE ENDOMETRIAL ABLATION;  Surgeon: Jonnie Kind, MD;  Location: [facility];  Service: Gynecology;  Laterality: N/A;  . Laparoscopic bilateral salpingectomy Bilateral 12/01/2012    Procedure: LAPAROSCOPIC BILATERAL SALPINGECTOMY ;  Surgeon: Jonnie Kind, MD;  Location: [facility];  Service: Gynecology;  Laterality: Bilateral;  . Lesion removal Left 12/01/2012    Procedure: SKIN TAG REMOVAL (Left inner thigh) ;  Surgeon: Jonnie Kind, MD;  Location: [facility];  Service: Gynecology;  Laterality: Left;  . Ablation      Uterine    FAMILY HISTORY: Family History  Problem Relation Age of Onset  . Colon cancer Neg Hx   . Cancer Maternal Grandmother     ovarian cancer  . Diabetes Maternal Grandfather   . Other Sister     Transverse myelitis  .  Scleroderma Mother   . Hypertension Father   . Stroke Paternal Grandmother   . Hypertension Paternal Grandmother     SOCIAL HISTORY: History   Social History  . Marital Status: Married    Spouse Name: Keenan Bachelor    Number of Children: 2  . Years of Education: 14   Occupational History  . 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 [employer]   Social History Main Topics  . Smoking status: Never Smoker   . Smokeless tobacco: Never Used  . Alcohol Use: No  . Drug Use: No  . Sexual Activity: Yes    Birth Control/ Protection: None, Other-see comments     Comment: vasectomy   Other Topics Concern  . Not on file   Social History Narrative   Patient is married Keenan Bachelor) and lives at home with her husband and two children.   Patient works full-time at [employer].   Patient has a Associates degree.   Patient is right-handed.   Patient drinks tea occasionally.            PHYSICAL EXAM  Filed Vitals:   12/28/13 0917  BP: 130/82  Pulse: 81  Temp: 97.4 F (36.3 C)  TempSrc: Oral  Height: 5' 8.25" (1.734 m)  Weight: 279 lb (126.554 kg)   Body mass index is 42.09 kg/(m^2).  Generalized: Well developed, in no acute distress   Neurological examination  Mentation: Alert oriented to time, place, history taking. Follows all commands speech and language fluent Cranial nerve II-XII: Pupils were equal round reactive to light. Extraocular movements were full, visual field were full on confrontational test. Facial sensation and strength were normal. Uvula tongue midline. Head turning and shoulder shrug  were normal and symmetric. Motor: The motor testing reveals 5 over 5 strength of all 4 extremities. Good symmetric motor tone is noted throughout.  Sensory: Sensory testing is intact to soft touch on all 4 extremities. No evidence of extinction is noted.  Coordination: Cerebellar testing reveals good finger-nose-finger and heel-to-shin bilaterally.  Gait and station: Gait is  normal. Tandem gait is normal. Romberg is negative. No drift is seen.  Reflexes: Deep tendon reflexes are symmetric and normal bilaterally.  Marland Kitchen   DIAGNOSTIC  DATA (LABS, IMAGING, TESTING) - I reviewed patient records, labs, notes, testing and imaging myself where available.  Lab Results  Component Value Date   WBC 9.9 11/27/2012   HGB 13.8 11/27/2012   HCT 41.9 11/27/2012   MCV 84.5 11/27/2012   PLT 258 11/27/2012      Component Value Date/Time   NA 132* 10/24/2011 0924   K 3.8 10/24/2011 0924   CL 102 10/24/2011 0924   CO2 24 10/24/2011 0924   GLUCOSE 88 10/24/2011 0924   BUN 14 10/24/2011 0924   CREATININE 0.68 10/24/2011 0924   CALCIUM 8.9 10/24/2011 0924   PROT 6.7 10/24/2011 0924   ALBUMIN 3.4* 10/24/2011 0924   AST 33 10/24/2011 0924   ALT 18 10/24/2011 0924   ALKPHOS 69 10/24/2011 0924   BILITOT 0.4 10/24/2011 0924   GFRNONAA >90 10/24/2011 0924   GFRAA >90 10/24/2011 0924      ASSESSMENT AND PLAN 43 y.o. year old female  has a past medical history of PVC (premature ventricular contraction); Chest pain, unspecified; Dyspnea; Palpitations; Obesity, unspecified; Colitis; IBS (irritable bowel syndrome); Nephrolithiasis; Generalized headaches; Enlarged lymph node; Abdominal pain; Family history of ovarian cancer; Nausea; Hiatal hernia; IBS (irritable bowel syndrome); Menorrhagia (10/17/2012); Endometrial polyp (11/09/2012); and Irregular bleeding (01/01/2013). here with:  1. Migraine with aura 2. Abnormal MRI of the head 3. Dysesthesias  The patient reports that her headache frequency has increased. She is currently having 2 headaches a week. She would like to try preventative medication for these. I will start patient on nortriptyline 10 mg at bedtime. I have reviewed the side effects with the patient. She verbalized understanding. The patient continues to have dysesthesias in the right and left arm and in the right foot in two toes. The patient has had a thorough workup  for multiple sclerosis which has been unremarkable. She did have a positive ANA in August. I will recheck this today. If her ANA remains positive we will refer the patient to rheumatology at her request. We will repeat a MRI of the brain at the next visit to look for any further changes. If the patient's symptoms worsen or she develops new symptoms she should let us know. Otherwise she will follow up in 4 months.   [name], MSN, NP-C 12/28/2013, 9:26 AM [facility] [address], [place], [place] [address] [phone]  Note: This document was prepared with digital dictation and possible smart phrase technology. Any transcriptional errors that result from this process are unintentional.

## 2013-12-28 NOTE — Patient Instructions (Signed)
Nortriptyline capsules What is this medicine? NORTRIPTYLINE (nor TRIP ti leen) is used to treat depression. This medicine may be used for other purposes; ask your health care provider or pharmacist if you have questions. COMMON BRAND NAME(S): Aventyl, Pamelor What should I tell my health care provider before I take this medicine? They need to know if you have any of these conditions: -an alcohol problem -bipolar disorder or schizophrenia -difficulty passing urine, prostate trouble -glaucoma -heart disease or recent heart attack -liver disease -over active thyroid -seizures -thoughts or plans of suicide or a previous suicide attempt or family history of suicide attempt -an unusual or allergic reaction to nortriptyline, other medicines, foods, dyes, or preservatives -pregnant or trying to get pregnant -breast-feeding How should I use this medicine? Take this medicine by mouth with a glass of water. Follow the directions on the prescription label. Take your doses at regular intervals. Do not take it more often than directed. Do not stop taking this medicine suddenly except upon the advice of your doctor. Stopping this medicine too quickly may cause serious side effects or your condition may worsen. A special MedGuide will be given to you by the pharmacist with each prescription and refill. Be sure to read this information carefully each time. Talk to your pediatrician regarding the use of this medicine in children. Special care may be needed. Overdosage: If you think you have taken too much of this medicine contact a poison control center or emergency room at once. NOTE: This medicine is only for you. Do not share this medicine with others. What if I miss a dose? If you miss a dose, take it as soon as you can. If it is almost time for your next dose, take only that dose. Do not take double or extra doses. What may interact with this medicine? Do not take this medicine with any of the  following medications: -arsenic trioxide -certain medicines medicines for irregular heart beat -cisapride -halofantrine -linezolid -MAOIs like Carbex, Eldepryl, Marplan, Nardil, and Parnate -methylene blue (injected into a vein) -other medicines for mental depression -phenothiazines like perphenazine, thioridazine and chlorpromazine -pimozide -probucol -procarbazine -sparfloxacin -St. John's Wort -ziprasidone This medicine may also interact with any of the following medications: -atropine and related drugs like hyoscyamine, scopolamine, tolterodine and others -barbiturate medicines for inducing sleep or treating seizures, such as phenobarbital -cimetidine -medicines for diabetes -medicines for seizures like carbamazepine or phenytoin -reserpine -thyroid medicine This list may not describe all possible interactions. Give your health care provider a list of all the medicines, herbs, non-prescription drugs, or dietary supplements you use. Also tell them if you smoke, drink alcohol, or use illegal drugs. Some items may interact with your medicine. What should I watch for while using this medicine? Tell your doctor if your symptoms do not get better or if they get worse. Visit your doctor or health care professional for regular checks on your progress. Because it may take several weeks to see the full effects of this medicine, it is important to continue your treatment as prescribed by your doctor. Patients and their families should watch out for new or worsening thoughts of suicide or depression. Also watch out for sudden changes in feelings such as feeling anxious, agitated, panicky, irritable, hostile, aggressive, impulsive, severely restless, overly excited and hyperactive, or not being able to sleep. If this happens, especially at the beginning of treatment or after a change in dose, call your health care professional. You may get drowsy or dizzy. Do not drive,   use machinery, or do  anything that needs mental alertness until you know how this medicine affects you. Do not stand or sit up quickly, especially if you are an older patient. This reduces the risk of dizzy or fainting spells. Alcohol may interfere with the effect of this medicine. Avoid alcoholic drinks. Do not treat yourself for coughs, colds, or allergies without asking your doctor or health care professional for advice. Some ingredients can increase possible side effects. Your mouth may get dry. Chewing sugarless gum or sucking hard candy, and drinking plenty of water may help. Contact your doctor if the problem does not go away or is severe. This medicine may cause dry eyes and blurred vision. If you wear contact lenses you may feel some discomfort. Lubricating drops may help. See your eye doctor if the problem does not go away or is severe. This medicine can cause constipation. Try to have a bowel movement at least every 2 to 3 days. If you do not have a bowel movement for 3 days, call your doctor or health care professional. This medicine can make you more sensitive to the sun. Keep out of the sun. If you cannot avoid being in the sun, wear protective clothing and use sunscreen. Do not use sun lamps or tanning beds/booths. What side effects may I notice from receiving this medicine? Side effects that you should report to your doctor or health care professional as soon as possible: -allergic reactions like skin rash, itching or hives, swelling of the face, lips, or tongue -abnormal production of milk in females -breast enlargement in both males and females -breathing problems -confusion, hallucinations -fever with increased sweating -irregular or fast, pounding heartbeat -muscle stiffness, or spasms -pain or difficulty passing urine, loss of bladder control -seizures -suicidal thoughts or other mood changes -swelling of the testicles -tingling, pain, or numbness in the feet or hands -yellowing of the eyes or  skin Side effects that usually do not require medical attention (report to your doctor or health care professional if they continue or are bothersome): -change in sex drive or performance -diarrhea -nausea, vomiting -weight gain or loss This list may not describe all possible side effects. Call your doctor for medical advice about side effects. You may report side effects to FDA at 1-800-FDA-1088. Where should I keep my medicine? Keep out of the reach of children. Store at room temperature between 15 and 30 degrees C (59 and 86 degrees F). Keep container tightly closed. Throw away any unused medicine after the expiration date. NOTE: This sheet is a summary. It may not cover all possible information. If you have questions about this medicine, talk to your doctor, pharmacist, or health care provider.  2015, Elsevier/Gold Standard. (2011-05-24 13:57:12)  

## 2013-12-29 LAB — ENA+DNA/DS+SJORGEN'S
ENA RNP Ab: <0.2 AI (ref 0.0–0.9)
ENA SSA (RO) Ab: <0.2 AI (ref 0.0–0.9)
ENA SSB (LA) Ab: <0.2 AI (ref 0.0–0.9)
dsDNA Ab: 1 IU/mL (ref 0–9)

## 2013-12-29 LAB — ANA W/REFLEX: ANA: POSITIVE — AB

## 2013-12-31 ENCOUNTER — Telehealth: Payer: Self-pay | Admitting: Adult Health

## 2013-12-31 DIAGNOSIS — R768 Other specified abnormal immunological findings in serum: Secondary | ICD-10-CM

## 2013-12-31 NOTE — Telephone Encounter (Signed)
I called the patient in regards to her lab results. I left a message for her to call our office at her convenience. Her ANA was positive. This is the second positive and the patient would like to be seen  by rheumatology.  This was discussed during our office visit pending her lab results.  I will make referral to rheumatology. I will  try to call  the patient again later today.

## 2014-01-01 NOTE — Telephone Encounter (Signed)
I called the patient regarding her lab results. I let her know that her ANA was positive and I have put a referral in to rheumatology. Patient verbalized understanding.

## 2014-01-01 NOTE — Telephone Encounter (Signed)
I called the patient. Left a message for her to cal the office regarding her lab results.

## 2014-01-01 NOTE — Progress Notes (Signed)
I agree with the assessment and plan as directed by NP .The patient is known to me .   Jasma Seevers, MD  

## 2014-01-01 NOTE — Telephone Encounter (Signed)
Pt returned Megan's call and wants her to call her back at work @ [phone].

## 2014-02-05 ENCOUNTER — Ambulatory Visit: Payer: Federal, State, Local not specified - PPO | Admitting: Dietician

## 2014-02-13 ENCOUNTER — Inpatient Hospital Stay (HOSPITAL_COMMUNITY): Admission: RE | Admit: 2014-02-13 | Payer: Federal, State, Local not specified - PPO | Source: Ambulatory Visit

## 2014-02-13 ENCOUNTER — Ambulatory Visit (HOSPITAL_COMMUNITY): Admission: RE | Admit: 2014-02-13 | Payer: Federal, State, Local not specified - PPO | Source: Ambulatory Visit

## 2014-02-18 ENCOUNTER — Encounter (HOSPITAL_COMMUNITY): Admission: RE | Payer: Self-pay | Source: Ambulatory Visit

## 2014-02-18 SURGERY — BREATH TEST, FOR HELICOBACTER PYLORI

## 2014-02-19 ENCOUNTER — Ambulatory Visit (HOSPITAL_COMMUNITY)
Admission: RE | Admit: 2014-02-19 | Payer: Federal, State, Local not specified - PPO | Source: Ambulatory Visit | Admitting: Surgery

## 2014-03-02 ENCOUNTER — Other Ambulatory Visit: Payer: Self-pay | Admitting: Neurology

## 2014-04-30 ENCOUNTER — Ambulatory Visit (INDEPENDENT_AMBULATORY_CARE_PROVIDER_SITE_OTHER): Payer: Federal, State, Local not specified - PPO | Admitting: Adult Health

## 2014-04-30 ENCOUNTER — Encounter: Payer: Self-pay | Admitting: Adult Health

## 2014-04-30 VITALS — BP 105/65 | HR 83 | Ht 68.25 in | Wt 274.0 lb

## 2014-04-30 DIAGNOSIS — G43009 Migraine without aura, not intractable, without status migrainosus: Secondary | ICD-10-CM

## 2014-04-30 DIAGNOSIS — R768 Other specified abnormal immunological findings in serum: Secondary | ICD-10-CM | POA: Diagnosis not present

## 2014-04-30 DIAGNOSIS — R9089 Other abnormal findings on diagnostic imaging of central nervous system: Secondary | ICD-10-CM

## 2014-04-30 DIAGNOSIS — R930 Abnormal findings on diagnostic imaging of skull and head, not elsewhere classified: Secondary | ICD-10-CM

## 2014-04-30 MED ORDER — NORTRIPTYLINE HCL 10 MG PO CAPS: 10.0000 mg | ORAL_CAPSULE | Freq: Every day | ORAL | Status: DC

## 2014-04-30 MED ORDER — BACLOFEN 10 MG PO TABS: ORAL_TABLET | ORAL | Status: DC

## 2014-04-30 NOTE — Progress Notes (Signed)
I have read the note, and I agree with the clinical assessment and plan.  Aviva Wolfer KEITH   

## 2014-04-30 NOTE — Patient Instructions (Signed)
Continue Nortriptyline and baclofen. Refills sent If your symptoms worsen or you develop new symptoms let us know.

## 2014-04-30 NOTE — Progress Notes (Signed)
PATIENT: Meghan [name] DOB: 19-Jul-1970  REASON FOR VISIT: follow up- migraine, abnormal MRI brain, positive ANA HISTORY FROM: patient  HISTORY OF PRESENT ILLNESS: Meghan [name] is a 44 year old female with a history of migraines and abnormal MRI. She returns today for follow-up. The patient states that she is been taking nortriptyline 10 mg at bedtime and this has improved her headache frequency. She states in the last month she has had  1 headache. She states that she when she feels the headache coming on she can take Xanax and that usually resolves the headache as well. Her lab work did indicate an elevated ANA she had a follow-up with Meghan [name] but her appointment got canceled. She states that she may need to be sent to another rheumatologist because Meghan [name] has very limited hours. The patient continues to use baclofen as needed for squeezing sensation around the upper abdomen. She states this works well for her. She denies any new symptoms. She returns today for evaluation.  HISTORY 12/28/13: Meghan [name] is a 44 year old female with a history of migraines and abnormal brain MRI. She returns today for follow-up. The patient states she normally takes Xanax for headaches. Since her last visit she has had 2 headaches per week. Her headaches are normally located on the left side. She has some tingling on the left side of her head. She has been having an aura- she states that it will be round objects that will appear in her vision and normally a headache will occur afterwards. Confirms photophobia and denies phonophobia. She normally has to get in a dark room for her headache to subside. She continues to take baclofen for a squeezing sensation around her upper abdomen. She was taking it daily but now is taking it PRN for the squeezing sensation. She was having in flank pain and saw her PCP. They did a CT of the kidneys that was unremarkable. She is still having this pain. She reports that this  is working well for her. She continues to have sensory changes in the right and left arm and the right foot in two toes that are intermittent. Denies any new symptoms.   HISTORY 08/29/13: 44 year old female with a history of migraines. She returns today for follow-up. She normally takes Xanax for her headaches. Patient states she has had three headaches since the last visit. This is typically not normal for her, therefore she feels that her headaches may be due to the baclofen. She was started on baclofen for a squeezing sensation around her upper abdomen. She has been seen by a GI doctor regarding this sensation but everything was unremarkable except mention of a possible hiatal hernia according to the patient. She had an abnormal MRI that showed nonspecific white matter changes. She was set up for a LP. Lab work from the LP was unremarkable. Negative for oligoclonal bands. Patient reported numbness in the right arm and two toes on the right foot at her last visit. She states that this is still present. She describes this sensation has a "heaviness." Denies any changes with her vision. Denies changes with bowel and bladder. Although she states that since she started baclofen she has noticed some intermittent pain when her bladder is palpated. She has also noticed some tenderness in the left flank area that comes and goes. She states that she does have a history of [name] stones. She confirms this pain is similar to what she has felt in the past with [name] stones. Meghan [name]  any blood or odor in her urine. Denies burning with urination. She actually feels relief with urination. No issues with ambulation and balance. Denies falls. Patient does report fatigue states that she feels exhausted.   HISTORY 08/01/13 (CD): Meghan [name] works as a Biochemist, clinical. She has a history of headaches that respond to Xanax. Her migraines are left sided dominant and associated with right arm numbness and a pressure sensation that  radiated from the retro-orbit to the left neck and scapula. Never the other way around. Always associated wit nausea, phono- and photo phobia, no aura, and no status. Sometime stress seems to bring the migraines on, she clenches her jaw , too . No menstrual effect and weather changes are not triggers, no food riggers and sleep deprivation is not a trigger , either. She had seen Dr. Erling Cruz in 2003 and had an brain MRI - documented unspecific lesions at the time. The right arm numbness is new since than than and she has 2 numb toes on the right foot. The patient spoke to Dr. Anastasio Champion , who referred her here for a complete work up. She had a protocol calibration MRI at the local hospital, allowing her to obtain a brain scan. This report decribed white matter lesions and was performed with gadolinium. There was no acute infarct or hemorrhage but there were a small full side of T2 hyperintensities in the cerebral white matter both hemispheres are affected, predominantly subcortical and mostly in the frontal. The number had increased over the last 12 years. The lesions are not periventricularly located or infratentorial and there is no abnormal corpus callosum lesion. There therefore not in the typical distribution for multiple sclerosis but could indicate a vasculitic change. Thisdistribution can also be seen in people with migraines. She has had a complete spine MRI, no major abnormalities, here, especially minimal bulging discs.   REVIEW OF SYSTEMS: Out of a complete 14 system review of symptoms, the patient complains only of the following symptoms, and all other reviewed systems are negative.  Insomnia, daytime sleepiness, neck pain, neck stiffness  ALLERGIES: Allergies  Allergen Reactions  . Morphine Other (See Comments)    Neck pain  . Oxycodone-Acetaminophen Hives and Itching    Itching    HOME MEDICATIONS: Outpatient Prescriptions Prior to Visit  Medication Sig Dispense Refill  . ALPRAZolam  (XANAX) 0.5 MG tablet Take 0.5 mg by mouth at bedtime as needed. Sleep.    . baclofen (LIORESAL) 10 MG tablet TAKE ONE (1) TABLET EACH DAY AS NEEDED FOR ABDOMINAL AND SPINAL TIGHTNESS 30 tablet 6  . Biotin 1 MG CAPS Take 1 capsule by mouth as needed.     . Cholecalciferol (VITAMIN D-3) 5000 UNITS TABS Take 1 tablet by mouth as needed.     . naproxen (NAPROSYN) 500 MG tablet Take 500 mg by mouth 2 (two) times daily as needed for mild pain.     . nortriptyline (PAMELOR) 10 MG capsule Take 1 capsule (10 mg total) by mouth at bedtime. 30 capsule 3   No facility-administered medications prior to visit.    PAST MEDICAL HISTORY: Past Medical History  Diagnosis Date  . PVC (premature ventricular contraction)   . Chest pain, unspecified   . Dyspnea   . Palpitations   . Obesity, unspecified   . Colitis     see psh for colonoscopy reports  . IBS (irritable bowel syndrome)   . Nephrolithiasis   . Generalized headaches   . Enlarged lymph node  right side - arm  . Abdominal pain     RLQ  . Family history of ovarian cancer     MGM  . Nausea   . Hiatal hernia   . IBS (irritable bowel syndrome)   . Menorrhagia 10/17/2012  . Endometrial polyp 11/09/2012  . Irregular bleeding 01/01/2013    PAST SURGICAL HISTORY: Past Surgical History  Procedure Laterality Date  . [name] stone surgery    . Knee arthroscopy  2008    ACL repair  . Colonoscopy with ileoscopy  2008    Dr. Laural Golden. patch of coarse mucosa at TI, bx  neg.  Random colon bx showed minimal active focal colitis ?resolving infection. Felt not c/w IBD.  Marland Kitchen Colonoscopy  2005    Dr. Irving Shows. Colitis of cecum and ICV, bx nonspecific  . Esophagogastroduodenoscopy  02/2008    Dr. Gala Romney- birnak appearing tubular esophagus. small hiatal hernia, o/w normal stomach, D1, D2  . Dilitation & currettage/hystroscopy with thermachoice ablation N/A 12/01/2012    Procedure: DILATATION & CURETTAGE;REMOVAL OF ENDOMETRIAL POLYP; HYSTEROSCOPY WITH  THERMACHOICE ENDOMETRIAL ABLATION;  Surgeon: Jonnie Kind, MD;  Location: [facility];  Service: Gynecology;  Laterality: N/A;  . Laparoscopic bilateral salpingectomy Bilateral 12/01/2012    Procedure: LAPAROSCOPIC BILATERAL SALPINGECTOMY ;  Surgeon: Jonnie Kind, MD;  Location: [facility];  Service: Gynecology;  Laterality: Bilateral;  . Lesion removal Left 12/01/2012    Procedure: SKIN TAG REMOVAL (Left inner thigh) ;  Surgeon: Jonnie Kind, MD;  Location: [facility];  Service: Gynecology;  Laterality: Left;  . Ablation      Uterine    FAMILY HISTORY: Family History  Problem Relation Age of Onset  . Colon cancer Neg Hx   . Cancer Maternal Grandmother     ovarian cancer  . Diabetes Maternal Grandfather   . Other Sister     Transverse myelitis  . Scleroderma Mother   . Hypertension Father   . Stroke Paternal Grandmother   . Hypertension Paternal Grandmother     SOCIAL HISTORY: History   Social History  . Marital Status: Married    Spouse Name: Keenan Bachelor  . Number of Children: 2  . Years of Education: 14   Occupational History  . 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 [employer]   Social History Main Topics  . Smoking status: Never Smoker   . Smokeless tobacco: Never Used  . Alcohol Use: No  . Drug Use: No  . Sexual Activity: Yes    Birth Control/ Protection: None, Other-see comments     Comment: vasectomy   Other Topics Concern  . Not on file   Social History Narrative   Patient is married Keenan Bachelor) and lives at home with her husband and two children.   Patient works full-time at [employer].   Patient has a Associates degree.   Patient is right-handed.   Patient drinks tea occasionally.            PHYSICAL EXAM  Filed Vitals:   04/30/14 1421  BP: 105/65  Pulse: 83  Height: 5' 8.25" (1.734 m)  Weight: 274 lb (124.286 kg)   Body mass index is 41.34 kg/(m^2).  Generalized: Well developed, in no acute distress   Neurological examination    Mentation: Alert oriented to time, place, history taking. Follows all commands speech and language fluent Cranial nerve II-XII: Pupils were equal round reactive to light. Extraocular movements were full, visual field were  full on confrontational test. Facial sensation and strength were normal. Uvula tongue midline. Head turning and shoulder shrug  were normal and symmetric. Motor: The motor testing reveals 5 over 5 strength of all 4 extremities. Good symmetric motor tone is noted throughout.  Sensory: Sensory testing is intact to soft touch on all 4 extremities. No evidence of extinction is noted.  Coordination: Cerebellar testing reveals good finger-nose-finger and heel-to-shin bilaterally.  Gait and station: Gait is normal. Tandem gait is normal. Romberg is negative. No drift is seen.  Reflexes: Deep tendon reflexes are symmetric and normal bilaterally.  Marland Kitchen   DIAGNOSTIC DATA (LABS, IMAGING, TESTING) - I reviewed patient records, labs, notes, testing and imaging myself where available.     ASSESSMENT AND PLAN 44 y.o. year old female  has a past medical history of PVC (premature ventricular contraction); Chest pain, unspecified; Dyspnea; Palpitations; Obesity, unspecified; Colitis; IBS (irritable bowel syndrome); Nephrolithiasis; Generalized headaches; Enlarged lymph node; Abdominal pain; Family history of ovarian cancer; Nausea; Hiatal hernia; IBS (irritable bowel syndrome); Menorrhagia (10/17/2012); Endometrial polyp (11/09/2012); and Irregular bleeding (01/01/2013). here with:  1. Headache 2. Abnormal MRI of the brain 3. Positive ANA  Overall the patient has remained stable. Her headache frequency has improved with nortriptyline. She will continue to take nortriptyline 10 mg at bedtime. I will refill this today. The patient can continue to use baclofen as needed. I will refill this today as well. The patient has had 2 positive ANA test and was referred to rheumatology. However her  appointment was canceled with Dr. Estanislado Pandy. Patient is unable to reschedule this appointment due to Dr. Arlean Hopping limited hours. I will see if we can refer the patient to another rheumatologist. She will follow-up with this office in 6 months or sooner if needed.  [name], MSN, NP-C 04/30/2014, 2:36 PM [facility] [address], [place], [place] [address] [phone]  Note: This document was prepared with digital dictation and possible smart phrase technology. Any transcriptional errors that result from this process are unintentional.

## 2014-06-25 ENCOUNTER — Encounter: Payer: Self-pay | Admitting: Neurology

## 2014-06-25 ENCOUNTER — Telehealth: Payer: Self-pay

## 2014-06-25 NOTE — Telephone Encounter (Signed)
I see there is no referral from the 04/2014 visit with [facility] for a referral to another rheumatologist per Megan's note. Pt says she went to Dr. Estanislado Pandy and they cancelled her appt in January of 2016. Can you research this and find out what rhematologist the pt was referred to in April of 2016?

## 2014-06-26 NOTE — Telephone Encounter (Signed)
Called and spoke to patient she has apt with Dr. Estanislado Pandy office 07-09-14 arrive at 1:30. I explained to patient I can send her somewhere else she said she will keep the apt.

## 2014-09-10 ENCOUNTER — Encounter: Payer: Self-pay | Admitting: Adult Health

## 2014-09-12 ENCOUNTER — Ambulatory Visit: Payer: Federal, State, Local not specified - PPO | Admitting: Gastroenterology

## 2014-09-30 ENCOUNTER — Ambulatory Visit: Payer: Federal, State, Local not specified - PPO | Admitting: Gastroenterology

## 2014-09-30 ENCOUNTER — Encounter: Payer: Self-pay | Admitting: Adult Health

## 2014-10-02 ENCOUNTER — Encounter: Payer: Self-pay | Admitting: Gastroenterology

## 2014-10-02 ENCOUNTER — Ambulatory Visit (INDEPENDENT_AMBULATORY_CARE_PROVIDER_SITE_OTHER): Payer: Federal, State, Local not specified - PPO | Admitting: Gastroenterology

## 2014-10-02 VITALS — BP 138/88 | HR 88 | Temp 98.0°F | Ht 69.0 in | Wt 278.0 lb

## 2014-10-02 DIAGNOSIS — E669 Obesity, unspecified: Secondary | ICD-10-CM | POA: Diagnosis not present

## 2014-10-02 DIAGNOSIS — R1030 Lower abdominal pain, unspecified: Secondary | ICD-10-CM | POA: Diagnosis not present

## 2014-10-02 DIAGNOSIS — K589 Irritable bowel syndrome without diarrhea: Secondary | ICD-10-CM | POA: Diagnosis not present

## 2014-10-02 MED ORDER — DICYCLOMINE HCL 10 MG PO CAPS: ORAL_CAPSULE | ORAL | Status: DC

## 2014-10-02 NOTE — Patient Instructions (Addendum)
1. Start Bentyl, take one capsule 30 minutes before meal and at bedtime (up to four times daily).  2. Complete stool test and return to our office.  3. Take a probiotic for the next four weeks. We did not have any samples. Try [employer] or Align.

## 2014-10-02 NOTE — Progress Notes (Signed)
Primary Care Physician: Delphina Cahill, MD  Primary Gastroenterologist:  Garfield Cornea, MD   Chief Complaint  Patient presents with  . Diarrhea    HPI: Meghan [name] is a 44 y.o. female here for further evaluation of diarrhea. She was last seen in October 2013. She has a history of GERD, IBS.  Prior TCS in 2005 by Dr. Irving Shows, colitis of cecum and ICV, bx nonspecific. TCS with ileoscopy in 01/2006 by Dr. Laural Golden showed patch of coarse mucosa at TI, bx negative. Random colon bx showed minimal active focal colitis ?resolving infection. Felt not to be c/w IBD. EGD 2010, small hh.   CT abdomen pelvis December 2015 with 2.6 cm uterine fibroid but no other significant abnormalities.  Since we last saw the patient she started seeing a neurologist. Ruled out for MS. Migraines under better control nortriptyline. Takes baclofen on occasion for "bandlike upper abdominal pain". Given by neurologist.  Patient states on August 7 (her anniversary), she developed severe lower abdominal crampy type pain associated with bowel movement. Symptoms have been persistent over the past 6 weeks. Stools range from [facility] stool 1-7 but mostly loose. Gurgling sounds in the left abdomen, wakes her up at night. Associated with nausea but no vomiting. Denies any melena or rectal bleeding. No recent medication changes, no recent anti-biotics. Notes fecal urgency has worsened. Severe cramping lasts for a few minutes at a time but aching pain lasts for several hours when it occurs. No unintentional weight loss. Denies heartburn. Complains of bloating.   Current Outpatient Prescriptions  Medication Sig Dispense Refill  . ALPRAZolam (XANAX) 0.5 MG tablet Take 0.5 mg by mouth at bedtime as needed. Sleep.    . baclofen (LIORESAL) 10 MG tablet TAKE ONE (1) TABLET EACH DAY AS NEEDED FOR ABDOMINAL AND SPINAL TIGHTNESS 30 tablet 6  . Biotin 1 MG CAPS Take 1 capsule by mouth as needed.     . nortriptyline (PAMELOR) 10 MG  capsule Take 1 capsule (10 mg total) by mouth at bedtime. 30 capsule 6   No current facility-administered medications for this visit.    Allergies as of 10/02/2014 - Review Complete 10/02/2014  Allergen Reaction Noted  . Morphine Other (See Comments) 08/01/2013  . Oxycodone-acetaminophen Hives and Itching 12/01/2012    ROS:  General: Negative for anorexia, weight loss, fever, chills, fatigue, weakness. ENT: Negative for hoarseness, difficulty swallowing , nasal congestion. CV: Negative for chest pain, angina, palpitations, dyspnea on exertion, peripheral edema.  Respiratory: Negative for dyspnea at rest, dyspnea on exertion, cough, sputum, wheezing.  GI: See history of present illness. GU:  Negative for dysuria, hematuria, urinary incontinence, urinary frequency, nocturnal urination.  Endo: Negative for unusual weight change.    Physical Examination:   BP 138/88 mmHg  Pulse 88  Temp(Src) 98 F (36.7 C)  Ht 5\' 9"  (1.753 m)  Wt 278 lb (126.1 kg)  BMI 41.03 kg/m2  LMP 09/16/2014  General: Well-nourished, well-developed in no acute distress.  Eyes: No icterus. Mouth: Oropharyngeal mucosa moist and pink , no lesions erythema or exudate. Lungs: Clear to auscultation bilaterally.  Heart: Regular rate and rhythm, no murmurs rubs or gallops.  Abdomen: Bowel sounds are normal, nontender, nondistended, no hepatosplenomegaly or masses, no abdominal bruits or hernia , no rebound or guarding.   Extremities: No lower extremity edema. No clubbing or deformities. Neuro: Alert and oriented x 4   Skin: Warm and dry, no jaundice.   Psych: Alert and cooperative, normal mood  and affect.

## 2014-10-02 NOTE — Assessment & Plan Note (Signed)
Six-week history of recent flare of lower abdominal crampy type pain associated with bowel movement. Improves with bowel movement. Stools more loose than not. Bristol stools range from 1-7 however. Complains of bloating. Suspect IBS flare. Given periods of constipation, doubt we are dealing with an infectious process. Her last colonoscopy was in 2008, history of colitis but not consistent with IBD.  Discussed at length with patient. Try of anti-spasmodic and probiotic. Check ifobt. If no improvement in symptoms, or ifobt positive then consider further work up.   Patient requested I check her hemoglobin A1c due to her obesity and family history of diabetes.

## 2014-10-03 NOTE — Progress Notes (Signed)
cc'ed to pcp °

## 2014-10-08 LAB — HEMOGLOBIN A1C
HEMOGLOBIN A1C: 5.3 % (ref <5.7)
Mean Plasma Glucose: 105 mg/dL (ref <117)

## 2014-10-08 NOTE — Progress Notes (Signed)
Quick Note:  Please let patient know her HgbA1C was normal. ______

## 2014-11-01 ENCOUNTER — Ambulatory Visit: Payer: Federal, State, Local not specified - PPO | Admitting: Adult Health

## 2014-11-05 ENCOUNTER — Ambulatory Visit: Payer: Federal, State, Local not specified - PPO | Admitting: Adult Health

## 2014-11-06 ENCOUNTER — Encounter: Payer: Self-pay | Admitting: Adult Health

## 2014-11-22 ENCOUNTER — Ambulatory Visit (INDEPENDENT_AMBULATORY_CARE_PROVIDER_SITE_OTHER): Payer: Federal, State, Local not specified - PPO

## 2014-11-22 DIAGNOSIS — R109 Unspecified abdominal pain: Secondary | ICD-10-CM | POA: Diagnosis not present

## 2014-11-25 LAB — IFOBT (OCCULT BLOOD): IMMUNOLOGICAL FECAL OCCULT BLOOD TEST: POSITIVE

## 2014-11-25 NOTE — Progress Notes (Signed)
Pt returned ifobt and it was positive 

## 2014-11-28 NOTE — Progress Notes (Signed)
Quick Note:  Please let patient know her ifobt is positive. Would advise a colonoscopy for bowel changes and ifobt positive. If agreeable, let me know. ______

## 2014-12-03 ENCOUNTER — Other Ambulatory Visit: Payer: Self-pay | Admitting: Adult Health

## 2014-12-03 DIAGNOSIS — Z0289 Encounter for other administrative examinations: Secondary | ICD-10-CM

## 2014-12-05 ENCOUNTER — Telehealth: Payer: Self-pay

## 2014-12-05 NOTE — Telephone Encounter (Signed)
R/s appt for pt to offer later appt time according to pt's  work schedule.

## 2014-12-08 NOTE — Progress Notes (Signed)
Quick Note:  Patient will need OV prior to her procedure since she hasn't been seen since 09/2014. Please have her come in before the 12/20/14 procedure date. ______

## 2014-12-16 ENCOUNTER — Ambulatory Visit: Payer: Federal, State, Local not specified - PPO | Admitting: Adult Health

## 2014-12-19 ENCOUNTER — Ambulatory Visit (INDEPENDENT_AMBULATORY_CARE_PROVIDER_SITE_OTHER): Payer: Federal, State, Local not specified - PPO | Admitting: Adult Health

## 2014-12-19 ENCOUNTER — Encounter: Payer: Self-pay | Admitting: Adult Health

## 2014-12-19 VITALS — BP 112/74 | HR 86 | Ht 69.0 in | Wt 277.5 lb

## 2014-12-19 DIAGNOSIS — G4700 Insomnia, unspecified: Secondary | ICD-10-CM

## 2014-12-19 DIAGNOSIS — R930 Abnormal findings on diagnostic imaging of skull and head, not elsewhere classified: Secondary | ICD-10-CM | POA: Diagnosis not present

## 2014-12-19 DIAGNOSIS — G43009 Migraine without aura, not intractable, without status migrainosus: Secondary | ICD-10-CM | POA: Diagnosis not present

## 2014-12-19 NOTE — Progress Notes (Signed)
PATIENT: Meghan [name] DOB: 03/20/1970  REASON FOR VISIT: follow up- migraine, abnormal MRI HISTORY FROM: patient  HISTORY OF PRESENT ILLNESS: Meghan [name] is a 44 year old female with a history of migraine headaches and abnormal MRI. She returns today for follow-up. She states that she continues to take nortriptyline 10 mg at bedtime. She states that she has a headache "every once in a while." She feels that the nortriptyline has been very beneficial. She states that recently she spends suffering from insomnia. She states that even taken the Xanax in the nortriptyline does not help her with her sleep. She states that her husband does snore and is wondering if that prohibits her from falling asleep. The patient is also was set up for an appointment with rheumatology due to a elevated ANA. She states that she has appointment in January. She does state that she has increased stress right now. She denies any new neurological symptoms. She does report that she had blood or so and will have a colonoscopy next week. She returns today for an evaluation.  HISTORY 04/30/14: Meghan [name] is a 44 year old female with a history of migraines and abnormal MRI. She returns today for follow-up. The patient states that she is been taking nortriptyline 10 mg at bedtime and this has improved her headache frequency. She states in the last month she has had 1 headache. She states that she when she feels the headache coming on she can take Xanax and that usually resolves the headache as well. Her lab work did indicate an elevated ANA she had a follow-up with Dr.Deveshwar but her appointment got canceled. She states that she may need to be sent to another rheumatologist because Dr.Deveshwar has very limited hours. The patient continues to use baclofen as needed for squeezing sensation around the upper abdomen. She states this works well for her. She denies any new symptoms. She returns today for evaluation.  REVIEW OF  SYSTEMS: Out of a complete 14 system review of symptoms, the patient complains only of the following symptoms, and all other reviewed systems are negative.  Fatigue, ringing in ears, constipation, diarrhea, nausea, insomnia, frequent waking, daytime sleepiness, neck pain, neck stiffness, joint pain, urgency, headache, numbness, speech difficulty  ALLERGIES: Allergies  Allergen Reactions  . Morphine Other (See Comments)    Neck pain  . Oxycodone-Acetaminophen Hives and Itching    Itching    HOME MEDICATIONS: Outpatient Prescriptions Prior to Visit  Medication Sig Dispense Refill  . ALPRAZolam (XANAX) 0.5 MG tablet Take 0.5 mg by mouth at bedtime as needed. Sleep.    . baclofen (LIORESAL) 10 MG tablet TAKE ONE (1) TABLET EACH DAY AS NEEDED FOR ABDOMINAL AND SPINAL TIGHTNESS 30 tablet 6  . Biotin 1 MG CAPS Take 1 capsule by mouth as needed.     . nortriptyline (PAMELOR) 10 MG capsule TAKE ONE CAPSULE BY MOUTH AT BEDTIME 30 capsule 0  . dicyclomine (BENTYL) 10 MG capsule Take one capsule 30 minutes before meal and at bedtime (up to four times daily). 120 capsule 0   No facility-administered medications prior to visit.    PAST MEDICAL HISTORY: Past Medical History  Diagnosis Date  . PVC (premature ventricular contraction)   . Chest pain, unspecified   . Dyspnea   . Palpitations   . Obesity, unspecified   . Colitis     see psh for colonoscopy reports  . IBS (irritable bowel syndrome)   . Nephrolithiasis   . Generalized headaches   .  Enlarged lymph node     right side - arm  . Abdominal pain     RLQ  . Family history of ovarian cancer     MGM  . Nausea   . Hiatal hernia   . IBS (irritable bowel syndrome)   . Menorrhagia 10/17/2012  . Endometrial polyp 11/09/2012  . Irregular bleeding 01/01/2013    PAST SURGICAL HISTORY: Past Surgical History  Procedure Laterality Date  . Kidney stone surgery    . Knee arthroscopy  2008    ACL repair  . Colonoscopy with ileoscopy   2008    Dr. Laural Golden. patch of coarse mucosa at TI, bx  neg.  Random colon bx showed minimal active focal colitis ?resolving infection. Felt not c/w IBD.  Marland Kitchen Colonoscopy  2005    Dr. Irving Shows. Colitis of cecum and ICV, bx nonspecific  . Esophagogastroduodenoscopy  02/2008    Dr. Gala Romney- birnak appearing tubular esophagus. small hiatal hernia, o/w normal stomach, D1, D2  . Dilitation & currettage/hystroscopy with thermachoice ablation N/A 12/01/2012    Procedure: DILATATION & CURETTAGE;REMOVAL OF ENDOMETRIAL POLYP; HYSTEROSCOPY WITH THERMACHOICE ENDOMETRIAL ABLATION;  Surgeon: Jonnie Kind, MD;  Location: [facility];  Service: Gynecology;  Laterality: N/A;  . Laparoscopic bilateral salpingectomy Bilateral 12/01/2012    Procedure: LAPAROSCOPIC BILATERAL SALPINGECTOMY ;  Surgeon: Jonnie Kind, MD;  Location: [facility];  Service: Gynecology;  Laterality: Bilateral;  . Lesion removal Left 12/01/2012    Procedure: SKIN TAG REMOVAL (Left inner thigh) ;  Surgeon: Jonnie Kind, MD;  Location: [facility];  Service: Gynecology;  Laterality: Left;  . Ablation      Uterine    FAMILY HISTORY: Family History  Problem Relation Age of Onset  . Colon cancer Neg Hx   . Cancer Maternal Grandmother     ovarian cancer  . Diabetes Maternal Grandfather   . Other Sister     Transverse myelitis  . Scleroderma Mother   . Hypertension Father   . Stroke Paternal Grandmother   . Hypertension Paternal Grandmother     SOCIAL HISTORY: Social History   Social History  . Marital Status: Married    Spouse Name: Keenan Bachelor  . Number of Children: 2  . Years of Education: 14   Occupational History  . 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  . [place]   Social History Main Topics  . Smoking status: Never Smoker   . Smokeless tobacco: Never Used  . Alcohol Use: No  . Drug Use: No  . Sexual Activity: Yes    Birth Control/ Protection: None, Other-see comments     Comment: vasectomy   Other Topics  Concern  . Not on file   Social History Narrative   Patient is married Keenan Bachelor) and lives at home with her husband and two children.   Patient works full-time at [employer].   Patient has a Associates degree.   Patient is right-handed.   Patient drinks tea occasionally.            PHYSICAL EXAM  Filed Vitals:   12/19/14 1549  BP: 112/74  Pulse: 86  Height: 5\' 9"  (1.753 m)  Weight: 277 lb 8 oz (125.873 kg)   Body mass index is 40.96 kg/(m^2).  Generalized: Well developed, in no acute distress   Neurological examination  Mentation: Alert oriented to time, place, history taking. Follows all commands speech and language fluent Cranial nerve II-XII: Pupils were equal round reactive  to light. Extraocular movements were full, visual field were full on confrontational test. Facial sensation and strength were normal. Uvula tongue midline. Head turning and shoulder shrug  were normal and symmetric. Motor: The motor testing reveals 5 over 5 strength of all 4 extremities. Good symmetric motor tone is noted throughout.  Sensory: Sensory testing is intact to soft touch on all 4 extremities. No evidence of extinction is noted.  Coordination: Cerebellar testing reveals good finger-nose-finger and heel-to-shin bilaterally.  Gait and station: Gait is normal. Tandem gait is normal. Romberg is negative. No drift is seen.  Reflexes: Deep tendon reflexes are symmetric and normal bilaterally.   DIAGNOSTIC DATA (LABS, IMAGING, TESTING) - I reviewed patient records, labs, notes, testing and imaging myself where available.  Lab Results  Component Value Date   WBC 9.9 11/27/2012   HGB 13.8 11/27/2012   HCT 41.9 11/27/2012   MCV 84.5 11/27/2012   PLT 258 11/27/2012      Component Value Date/Time   NA 132* 10/24/2011 0924   K 3.8 10/24/2011 0924   CL 102 10/24/2011 0924   CO2 24 10/24/2011 0924   GLUCOSE 88 10/24/2011 0924   BUN 14 10/24/2011 0924   CREATININE 0.68 10/24/2011 0924    CALCIUM 8.9 10/24/2011 0924   PROT 6.7 10/24/2011 0924   ALBUMIN 3.4* 10/24/2011 0924   AST 33 10/24/2011 0924   ALT 18 10/24/2011 0924   ALKPHOS 69 10/24/2011 0924   BILITOT 0.4 10/24/2011 0924   GFRNONAA >90 10/24/2011 0924   GFRAA >90 10/24/2011 0924    Lab Results  Component Value Date   HGBA1C 5.3 10/07/2014      ASSESSMENT AND PLAN 44 y.o. year old female  has a past medical history of PVC (premature ventricular contraction); Chest pain, unspecified; Dyspnea; Palpitations; Obesity, unspecified; Colitis; IBS (irritable bowel syndrome); Nephrolithiasis; Generalized headaches; Enlarged lymph node; Abdominal pain; Family history of ovarian cancer; Nausea; Hiatal hernia; IBS (irritable bowel syndrome); Menorrhagia (10/17/2012); Endometrial polyp (11/09/2012); and Irregular bleeding (01/01/2013). here with:  1. Migraine headaches 2. Abnormal MRI 3. Insomnia  The patient will continue taking nortriptyline 10 mg at bedtime. I did advise the patient that she could increase the nortriptyline to 2 tablets at bedtime to see if this helps with her sleep. Patient will continue to use baclofen as needed. I have spoken to the patient also about sleep hygiene. Patient verbalized understanding. She will follow-up in 6 months or sooner if needed.     [name], MSN, NP-C 12/19/2014, 4:49 PM [facility] [address], [place], [place] [address] [phone]

## 2014-12-19 NOTE — Progress Notes (Signed)
I agree with the assessment and plan as directed by NP .The patient is known to me .   Jami Bogdanski, MD  

## 2014-12-19 NOTE — Patient Instructions (Signed)
Continue Nortriptyline. May increase to 2 tablets at bedtime to see if it helps with Sleep: Sleep Hygiene: eminate TV time 1 hour before desired bedtime, cold bedroom, hot shower before bed, try going to sleep before husband to prevent disruption from snoring.  If your symptoms worsen or you develop new symptoms please let us know.

## 2014-12-20 ENCOUNTER — Encounter: Payer: Self-pay | Admitting: Gastroenterology

## 2014-12-20 ENCOUNTER — Ambulatory Visit (INDEPENDENT_AMBULATORY_CARE_PROVIDER_SITE_OTHER): Payer: Federal, State, Local not specified - PPO | Admitting: Gastroenterology

## 2014-12-20 ENCOUNTER — Other Ambulatory Visit: Payer: Self-pay

## 2014-12-20 VITALS — BP 136/84 | HR 89 | Temp 98.0°F | Ht 69.0 in | Wt 279.8 lb

## 2014-12-20 DIAGNOSIS — R194 Change in bowel habit: Secondary | ICD-10-CM | POA: Insufficient documentation

## 2014-12-20 DIAGNOSIS — K582 Mixed irritable bowel syndrome: Secondary | ICD-10-CM | POA: Diagnosis not present

## 2014-12-20 DIAGNOSIS — R195 Other fecal abnormalities: Secondary | ICD-10-CM

## 2014-12-20 DIAGNOSIS — K589 Irritable bowel syndrome without diarrhea: Secondary | ICD-10-CM

## 2014-12-20 MED ORDER — PEG 3350-KCL-NA BICARB-NACL 420 G PO SOLR: 4000.0000 mL | Freq: Once | ORAL | Status: DC

## 2014-12-20 NOTE — Patient Instructions (Signed)
1. Colonoscopy is scheduled. Please see separate instructions. 

## 2014-12-20 NOTE — Assessment & Plan Note (Signed)
44 year old female with history of chronic GERD, IBS who has been seen last few months for change in bowel habits, worsening abdominal cramping and loose stool. These symptoms have subsided some. She was I FOBT positive with her workup. Interestingly she has had a couple of colonoscopies with colitis but nonspecific and did not point towards IBD. Given recent decline in symptoms, positive I FOBT we have offered her colonoscopy for further evaluation. Augment conscious sedation with Phenergan 25 mg IV 30 minutes before the procedure.  I have discussed the risks, alternatives, benefits with regards to but not limited to the risk of reaction to medication, bleeding, infection, perforation and the patient is agreeable to proceed. Written consent to be obtained.

## 2014-12-20 NOTE — Progress Notes (Signed)
Primary Care Physician:  Wende Neighbors, MD  Primary Gastroenterologist:  Garfield Cornea, MD   Chief Complaint  Patient presents with  . Colonoscopy    HPI:  Meghan [name] is a 44 y.o. female here to schedule colonoscopy. She was last seen in September for evaluation of diarrhea. She has a history of chronic GERD, IBS.   Prior TCS in 2005 by Dr. Irving Shows, colitis of cecum and ICV, bx nonspecific. TCS with ileoscopy in 01/2006 by Dr. Laural Golden showed patch of coarse mucosa at TI, bx negative. Random colon bx showed minimal active focal colitis ?resolving infection. Felt not to be c/w IBD. EGD 2010, small hh.   Sees her neurologist on a regular basis for migraines. Takes nortriptyline with good control. Recently on Bentyl had breakthrough headaches and had to stop the medication. She has been on baclofen added by her neurologist for bandlike upper abdominal pain with good results. We had tried her on Bentyl recently because she had had a change with more persistent loose stools with severe abdominal cramping. Denies any melena or bright red blood per rectum. Recent I FOBT was positive. Denies heartburn, vomiting, dysphagia.  History of positive ANA, plans to see a rheumatologist in the near future.   Current Outpatient Prescriptions  Medication Sig Dispense Refill  . ALPRAZolam (XANAX) 0.5 MG tablet Take 0.5 mg by mouth at bedtime as needed. Sleep.    . baclofen (LIORESAL) 10 MG tablet TAKE ONE (1) TABLET EACH DAY AS NEEDED FOR ABDOMINAL AND SPINAL TIGHTNESS 30 tablet 6  . Biotin 1 MG CAPS Take 1 capsule by mouth as needed.     . nortriptyline (PAMELOR) 10 MG capsule TAKE ONE CAPSULE BY MOUTH AT BEDTIME 30 capsule 0   No current facility-administered medications for this visit.    Allergies as of 12/20/2014 - Review Complete 12/20/2014  Allergen Reaction Noted  . Morphine Other (See Comments) 08/01/2013  . Oxycodone-acetaminophen Hives and Itching 12/01/2012    Past Medical History   Diagnosis Date  . PVC (premature ventricular contraction)   . Chest pain, unspecified   . Dyspnea   . Palpitations   . Obesity, unspecified   . Colitis     see psh for colonoscopy reports  . IBS (irritable bowel syndrome)   . Nephrolithiasis   . Generalized headaches   . Enlarged lymph node     right side - arm  . Abdominal pain     RLQ  . Family history of ovarian cancer     MGM  . Nausea   . Hiatal hernia   . IBS (irritable bowel syndrome)   . Menorrhagia 10/17/2012  . Endometrial polyp 11/09/2012  . Irregular bleeding 01/01/2013    Past Surgical History  Procedure Laterality Date  . Kidney stone surgery    . Knee arthroscopy  2008    ACL repair  . Colonoscopy with ileoscopy  2008    Dr. Laural Golden. patch of coarse mucosa at TI, bx  neg.  Random colon bx showed minimal active focal colitis ?resolving infection. Felt not c/w IBD.  Marland Kitchen Colonoscopy  2005    Dr. Irving Shows. Colitis of cecum and ICV, bx nonspecific  . Esophagogastroduodenoscopy  02/2008    Dr. Gala Romney- birnak appearing tubular esophagus. small hiatal hernia, o/w normal stomach, D1, D2  . Dilitation & currettage/hystroscopy with thermachoice ablation N/A 12/01/2012    Procedure: DILATATION & CURETTAGE;REMOVAL OF ENDOMETRIAL POLYP; HYSTEROSCOPY WITH THERMACHOICE ENDOMETRIAL ABLATION;  Surgeon: Jonnie Kind, MD;  Location: [facility];  Service: Gynecology;  Laterality: N/A;  . Laparoscopic bilateral salpingectomy Bilateral 12/01/2012    Procedure: LAPAROSCOPIC BILATERAL SALPINGECTOMY ;  Surgeon: Jonnie Kind, MD;  Location: [facility];  Service: Gynecology;  Laterality: Bilateral;  . Lesion removal Left 12/01/2012    Procedure: SKIN TAG REMOVAL (Left inner thigh) ;  Surgeon: Jonnie Kind, MD;  Location: [facility];  Service: Gynecology;  Laterality: Left;  . Ablation      Uterine    Family History  Problem Relation Age of Onset  . Colon cancer Neg Hx   . Cancer Maternal Grandmother     ovarian cancer  . Diabetes  Maternal Grandfather   . Other Sister     Transverse myelitis  . Scleroderma Mother   . Hypertension Father   . Stroke Paternal Grandmother   . Hypertension Paternal Grandmother     Social History   Social History  . Marital Status: Married    Spouse Name: Keenan Bachelor  . Number of Children: 2  . Years of Education: 14   Occupational History  . 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 [employer]   Social History Main Topics  . Smoking status: Never Smoker   . Smokeless tobacco: Never Used  . Alcohol Use: No  . Drug Use: No  . Sexual Activity: Yes    Birth Control/ Protection: None, Other-see comments     Comment: vasectomy   Other Topics Concern  . Not on file   Social History Narrative   Patient is married Keenan Bachelor) and lives at home with her husband and two children.   Patient works full-time at [employer].   Patient has a Associates degree.   Patient is right-handed.   Patient drinks tea occasionally.            ROS:  General: Negative for anorexia, weight loss, fever, chills, fatigue, weakness. Eyes: Negative for vision changes.  ENT: Negative for hoarseness, difficulty swallowing , nasal congestion. CV: Negative for chest pain, angina, palpitations, dyspnea on exertion, peripheral edema.  Respiratory: Negative for dyspnea at rest, dyspnea on exertion, cough, sputum, wheezing.  GI: See history of present illness. GU:  Negative for dysuria, hematuria, urinary incontinence, urinary frequency, nocturnal urination.  MS: Negative for joint pain, low back pain.  Derm: Negative for rash or itching.  Neuro: Negative for weakness, abnormal sensation, seizure, frequent headaches, memory loss, confusion. Headaches under control Psych: Negative for anxiety, depression, suicidal ideation, hallucinations.  Endo: Negative for unusual weight change.  Heme: Negative for bruising or bleeding. Allergy: Negative for rash or hives.    Physical Examination:  BP  136/84 mmHg  Pulse 89  Temp(Src) 98 F (36.7 C) (Oral)  Ht 5\' 9"  (1.753 m)  Wt 279 lb 12.8 oz (126.916 kg)  BMI 41.30 kg/m2   General: Well-nourished, well-developed in no acute distress.  Head: Normocephalic, atraumatic.   Eyes: Conjunctiva pink, no icterus. Mouth: Oropharyngeal mucosa moist and pink , no lesions erythema or exudate. Neck: Supple without thyromegaly, masses, or lymphadenopathy.  Lungs: Clear to auscultation bilaterally.  Heart: Regular rate and rhythm, no murmurs rubs or gallops.  Abdomen: Bowel sounds are normal, nontender, nondistended, no hepatosplenomegaly or masses, no abdominal bruits or    hernia , no rebound or guarding.   Rectal: Not performed Extremities: No lower extremity edema. No clubbing or deformities.  Neuro: Alert and oriented x 4 , grossly normal neurologically.  Skin: Warm and  dry, no rash or jaundice.   Psych: Alert and cooperative, normal mood and affect.    Imaging Studies: No results found.

## 2014-12-23 NOTE — Progress Notes (Signed)
cc'ed to pcp °

## 2015-01-03 ENCOUNTER — Encounter: Payer: Self-pay | Admitting: Adult Health

## 2015-01-06 NOTE — Telephone Encounter (Signed)
Please refer this patient to another rheumatologist. Thanks. Please call and make her aware that we got her email and we are working on this. Thanks.

## 2015-01-07 ENCOUNTER — Other Ambulatory Visit: Payer: Self-pay | Admitting: Adult Health

## 2015-01-20 ENCOUNTER — Encounter: Payer: Self-pay | Admitting: Adult Health

## 2015-01-22 ENCOUNTER — Telehealth: Payer: Self-pay

## 2015-01-22 NOTE — Telephone Encounter (Signed)
LMOM for a return call to update triage prior to colonoscopy.

## 2015-01-22 NOTE — Telephone Encounter (Signed)
Noted. Ok to schedule as per original orders.

## 2015-01-22 NOTE — Telephone Encounter (Signed)
Pt called back and said she has not had any change in her medications since she was seen in the office. She is now scheduled for 02/10/2015.

## 2015-02-05 ENCOUNTER — Other Ambulatory Visit: Payer: Self-pay

## 2015-02-10 ENCOUNTER — Encounter (HOSPITAL_COMMUNITY)
Admission: RE | Disposition: A | Discharge status: Home Health | Payer: Self-pay | Source: Ambulatory Visit | Attending: Internal Medicine

## 2015-02-10 ENCOUNTER — Encounter (HOSPITAL_COMMUNITY): Payer: Self-pay

## 2015-02-10 ENCOUNTER — Ambulatory Visit (HOSPITAL_COMMUNITY)
Admission: RE | Admit: 2015-02-10 | Discharge: 2015-02-10 | Disposition: A | Discharge status: Home Health | Payer: Federal, State, Local not specified - PPO | Source: Ambulatory Visit | Attending: Internal Medicine | Admitting: Internal Medicine

## 2015-02-10 DIAGNOSIS — K589 Irritable bowel syndrome without diarrhea: Secondary | ICD-10-CM | POA: Insufficient documentation

## 2015-02-10 DIAGNOSIS — E669 Obesity, unspecified: Secondary | ICD-10-CM | POA: Insufficient documentation

## 2015-02-10 DIAGNOSIS — K648 Other hemorrhoids: Secondary | ICD-10-CM | POA: Insufficient documentation

## 2015-02-10 DIAGNOSIS — Z79899 Other long term (current) drug therapy: Secondary | ICD-10-CM | POA: Insufficient documentation

## 2015-02-10 DIAGNOSIS — K921 Melena: Secondary | ICD-10-CM | POA: Diagnosis not present

## 2015-02-10 DIAGNOSIS — R194 Change in bowel habit: Secondary | ICD-10-CM | POA: Insufficient documentation

## 2015-02-10 DIAGNOSIS — R195 Other fecal abnormalities: Secondary | ICD-10-CM | POA: Insufficient documentation

## 2015-02-10 HISTORY — PX: COLONOSCOPY: SHX5424

## 2015-02-10 SURGERY — COLONOSCOPY
Anesthesia: Moderate Sedation

## 2015-02-10 MED ORDER — SODIUM CHLORIDE 0.9 % IJ SOLN
INTRAMUSCULAR | Status: AC
  Filled 2015-02-10: qty 3

## 2015-02-10 MED ORDER — MEPERIDINE HCL 100 MG/ML IJ SOLN
INTRAMUSCULAR | Status: DC
Start: 2015-02-10 — End: 2015-02-10
  Filled 2015-02-10: qty 2

## 2015-02-10 MED ORDER — PROMETHAZINE HCL 25 MG/ML IJ SOLN
INTRAMUSCULAR | Status: AC
  Filled 2015-02-10: qty 1

## 2015-02-10 MED ORDER — MIDAZOLAM HCL 5 MG/5ML IJ SOLN
INTRAMUSCULAR | Status: DC | PRN
  Administered 2015-02-10 (×3): 2 mg via INTRAVENOUS

## 2015-02-10 MED ORDER — PROMETHAZINE HCL 25 MG/ML IJ SOLN
25.0000 mg | Freq: Once | INTRAMUSCULAR | Status: AC
  Administered 2015-02-10: 25 mg via INTRAVENOUS

## 2015-02-10 MED ORDER — ONDANSETRON HCL 4 MG/2ML IJ SOLN
INTRAMUSCULAR | Status: AC
  Filled 2015-02-10: qty 2

## 2015-02-10 MED ORDER — STERILE WATER FOR IRRIGATION IR SOLN
Status: DC | PRN
  Administered 2015-02-10: 12:00:00

## 2015-02-10 MED ORDER — MIDAZOLAM HCL 5 MG/5ML IJ SOLN
INTRAMUSCULAR | Status: AC
  Filled 2015-02-10: qty 10

## 2015-02-10 MED ORDER — SODIUM CHLORIDE 0.9 % IV SOLN
INTRAVENOUS | Status: DC
  Administered 2015-02-10: 11:00:00 via INTRAVENOUS

## 2015-02-10 MED ORDER — MEPERIDINE HCL 100 MG/ML IJ SOLN
INTRAMUSCULAR | Status: DC | PRN
  Administered 2015-02-10: 50 mg via INTRAVENOUS
  Administered 2015-02-10: 25 mg via INTRAVENOUS
  Administered 2015-02-10: 50 mg via INTRAVENOUS

## 2015-02-10 MED ORDER — ONDANSETRON HCL 4 MG/2ML IJ SOLN
INTRAMUSCULAR | Status: DC | PRN
  Administered 2015-02-10: 4 mg via INTRAVENOUS

## 2015-02-10 NOTE — Op Note (Signed)
[facility] [address] [place], [address]   COLONOSCOPY PROCEDURE REPORT  PATIENT: Meghan [name], Meghan [name]  MR#: [id number] BIRTHDATE: 09-19-1970 , 44  yrs. old GENDER: female ENDOSCOPIST: R.  Garfield Cornea, MD FACP [facility] REFERRED [id number] Anastasio Champion, M.D. PROCEDURE DATE:  2015-02-15 PROCEDURE:   Ileo-colonoscopy with biopsy INDICATIONS:Hemoccult-positive stool; history of mild colitis; recent alteration of bowel function. MEDICATIONS: Versed 6 mg IV and Demerol 125 mg IV in divided doses. Phenergan 25 mg IV Zofran 4 mg IV. ASA CLASS:       Class II  CONSENT: The risks, benefits, alternatives and imponderables including but not limited to bleeding, perforation as well as the possibility of a missed lesion have been reviewed.  The potential for biopsy, lesion removal, etc. have also been discussed. Questions have been answered.  All parties agreeable.  Please see the history and physical in the medical record for more information.  DESCRIPTION OF PROCEDURE:   After the risks benefits and alternatives of the procedure were thoroughly explained, informed consent was obtained.  The digital rectal exam revealed no abnormalities of the rectum.   The [id number]  endoscope was introduced through the anus and advanced to the terminal ileum which was intubated for a short distance. No adverse events experienced.   The quality of the prep was adequate  The instrument was then slowly withdrawn as the colon was fully examined. Estimated blood loss is zero unless otherwise noted in this procedure report.      COLON FINDINGS: Minimal internal hemorrhoids; otherwise, normal-appearing rectal mucosa.  Normal-appearing colonic mucosa. The distal 10 cm of terminal ileal mucosa also appeared normal.  Segmental biopsies of the ascending segment as well as the descending/sigmoid segments taken for histologic study. Retroflexion was performed. .  Withdrawal time=9  minutes 0 seconds.  The scope was withdrawn and the procedure completed. COMPLICATIONS: There were no immediate complications.  ENDOSCOPIC IMPRESSION: Normal-appearing ileocolonoscopy?"status post segmental biopsy  RECOMMENDATIONS: Check CBC today. Follow up on pathology.  eSigned:  R. Garfield Cornea, MD Rosalita Chessman [facility] Feb 15, 2015 12:34 PM   cc:  CPT CODES: ICD CODES:  The ICD and CPT codes recommended by this software are interpretations from the data that the clinical staff has captured with the software.  The verification of the translation of this report to the ICD and CPT codes and modifiers is the sole responsibility of the health care institution and practicing physician where this report was generated.  [employer] will not be held responsible for the validity of the ICD and CPT codes included on this report.  AMA assumes no liability for data contained or not contained herein. CPT is a Designer, television/film set of the [employer].

## 2015-02-10 NOTE — H&P (Signed)
@LOGO @   Primary Care Physician:  Wende Neighbors, MD Primary Gastroenterologist:  Dr. Gala Romney  Pre-Procedure History & Physical: HPI:  Meghan [name] is a 45 y.o. female here for evaluation with Hemoccult-positive stool.  Colonoscopy demonstrated nonspecific colitis. Some tendency towards loose stools recently but things are back to normal. No melena or hematochezia. No NSAIDs. Here for evaluation of her lower GI tract.  Patient notes since limiting bread from her diet her GI tract has settled down and she is virtually asymptomatic.  Past Medical History  Diagnosis Date  . PVC (premature ventricular contraction)   . Chest pain, unspecified   . Dyspnea   . Palpitations   . Obesity, unspecified   . Colitis     see psh for colonoscopy reports  . IBS (irritable bowel syndrome)   . Nephrolithiasis   . Generalized headaches   . Enlarged lymph node     right side - arm  . Abdominal pain     RLQ  . Family history of ovarian cancer     MGM  . Nausea   . Hiatal hernia   . IBS (irritable bowel syndrome)   . Menorrhagia 10/17/2012  . Endometrial polyp 11/09/2012  . Irregular bleeding 01/01/2013    Past Surgical History  Procedure Laterality Date  . Kidney stone surgery    . Knee arthroscopy  2008    ACL repair  . Colonoscopy with ileoscopy  2008    Dr. Laural Golden. patch of coarse mucosa at TI, bx  neg.  Random colon bx showed minimal active focal colitis ?resolving infection. Felt not c/w IBD.  [facility] Colonoscopy  2005    Dr. Irving Shows. Colitis of cecum and ICV, bx nonspecific  . Esophagogastroduodenoscopy  02/2008    Dr. Gala Romney- birnak appearing tubular esophagus. small hiatal hernia, o/w normal stomach, D1, D2  . Dilitation & currettage/hystroscopy with thermachoice ablation N/A 12/01/2012    Procedure: DILATATION & CURETTAGE;REMOVAL OF ENDOMETRIAL POLYP; HYSTEROSCOPY WITH THERMACHOICE ENDOMETRIAL ABLATION;  Surgeon: Jonnie Kind, MD;  Location: [facility];  Service: Gynecology;   Laterality: N/A;  . Laparoscopic bilateral salpingectomy Bilateral 12/01/2012    Procedure: LAPAROSCOPIC BILATERAL SALPINGECTOMY ;  Surgeon: Jonnie Kind, MD;  Location: [facility];  Service: Gynecology;  Laterality: Bilateral;  . Lesion removal Left 12/01/2012    Procedure: SKIN TAG REMOVAL (Left inner thigh) ;  Surgeon: Jonnie Kind, MD;  Location: [facility];  Service: Gynecology;  Laterality: Left;  . Ablation      Uterine    Prior to Admission medications   Medication Sig Start Date End Date Taking? Authorizing Provider  ALPRAZolam Duanne Moron) 0.5 MG tablet Take 0.5 mg by mouth at bedtime as needed. Sleep. 09/24/11  Yes Historical Provider, MD  baclofen (LIORESAL) 10 MG tablet TAKE ONE (1) TABLET EACH DAY AS NEEDED FOR ABDOMINAL AND SPINAL TIGHTNESS 04/30/14  Yes [name], NP  Biotin 1 MG CAPS Take 1 capsule by mouth as needed (when remembers).    Yes Historical Provider, MD  Multiple Vitamin (MULTIVITAMIN WITH MINERALS) TABS tablet Take 1 tablet by mouth daily.   Yes Historical Provider, MD  naproxen sodium (ALEVE) 220 MG tablet Take 220 mg by mouth 2 (two) times daily as needed (headache).   Yes Historical Provider, MD  nortriptyline (PAMELOR) 10 MG capsule TAKE ONE CAPSULE BY MOUTH AT BEDTIME 01/07/15  Yes [name], NP  polyethylene glycol-electrolytes (NULYTELY/GOLYTELY) 420 G solution Take 4,000 mLs by mouth once. 12/20/14  Yes Mahala Menghini,  PA-C    Allergies as of 12/20/2014 - Review Complete 12/20/2014  Allergen Reaction Noted  . Morphine Other (See Comments) 08/01/2013  . Oxycodone-acetaminophen Hives and Itching 12/01/2012    Family History  Problem Relation Age of Onset  . Colon cancer Neg Hx   . Cancer Maternal Grandmother     ovarian cancer  . Diabetes Maternal Grandfather   . Other Sister     Transverse myelitis  . Scleroderma Mother   . Hypertension Father   . Stroke Paternal Grandmother   . Hypertension Paternal Grandmother     Social History   Social  History  . Marital Status: Married    Spouse Name: Keenan Bachelor  . Number of Children: 2  . Years of Education: 14   Occupational History  . 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 [place]   Social History Main Topics  . Smoking status: Never Smoker   . Smokeless tobacco: Never Used  . Alcohol Use: No  . Drug Use: No  . Sexual Activity: Yes    Birth Control/ Protection: None, Other-see comments     Comment: vasectomy   Other Topics Concern  . Not on file   Social History Narrative   Patient is married Keenan Bachelor) and lives at home with her husband and two children.   Patient works full-time at [employer].   Patient has a Associates degree.   Patient is right-handed.   Patient drinks tea occasionally.          Review of Systems: See HPI, otherwise negative ROS  Physical Exam: BP 119/72 mmHg  Pulse 77  Temp(Src) 98.8 F (37.1 C) (Oral)  Resp 18  Ht 5\' 9"  (1.753 m)  Wt 272 lb (123.378 kg)  BMI 40.15 kg/m2  SpO2 98%  LMP 01/20/2015 General:   Alert,   pleasant and cooperative in NAD Lungs:  Clear throughout to auscultation.   No wheezes, crackles, or rhonchi. No acute distress. Heart:  Regular rate and rhythm; no murmurs, clicks, rubs,  or gallops. Abdomen: Non-distended, normal bowel sounds.  Soft and nontender without appreciable mass or hepatosplenomegaly.  Pulses:  Normal pulses noted. Extremities:  Without clubbing or edema.  Impression:  45 year old with Hemoccult-positive stool. Transient alteration in bowel habits recently but reverted back to normal. History of nonspecific colitis on colonoscopies in the past. Not felt to have IBD. No family history of colon cancer.  Agree with need for further evaluation.  Recommendations:  Diagnostic colonoscopy today.  The risks, benefits, limitations, alternatives and imponderables have been reviewed with the patient. Questions have been answered. All parties are agreeable.         Notice: This dictation  was prepared with Dragon dictation along with smaller phrase technology. Any transcriptional errors that result from this process are unintentional and may not be corrected upon review.

## 2015-02-10 NOTE — Discharge Instructions (Signed)
°  Colonoscopy Discharge Instructions  Read the instructions outlined below and refer to this sheet in the next few weeks. These discharge instructions provide you with general information on caring for yourself after you leave the hospital. Your doctor may also give you specific instructions. While your treatment has been planned according to the most current medical practices available, unavoidable complications occasionally occur. If you have any problems or questions after discharge, call Dr. Gala Romney at [phone]. ACTIVITY  You may resume your regular activity, but move at a slower pace for the next 24 hours.   Take frequent rest periods for the next 24 hours.   Walking will help get rid of the air and reduce the bloated feeling in your belly (abdomen).   No driving for 24 hours (because of the medicine (anesthesia) used during the test).    Do not sign any important legal documents or operate any machinery for 24 hours (because of the anesthesia used during the test).  NUTRITION  Drink plenty of fluids.   You may resume your normal diet as instructed by your doctor.   Begin with a light meal and progress to your normal diet. Heavy or fried foods are harder to digest and may make you feel sick to your stomach (nauseated).   Avoid alcoholic beverages for 24 hours or as instructed.  MEDICATIONS  You may resume your normal medications unless your doctor tells you otherwise.  WHAT YOU CAN EXPECT TODAY  Some feelings of bloating in the abdomen.   Passage of more gas than usual.   Spotting of blood in your stool or on the toilet paper.  IF YOU HAD POLYPS REMOVED DURING THE COLONOSCOPY:  No aspirin products for 7 days or as instructed.   No alcohol for 7 days or as instructed.   Eat a soft diet for the next 24 hours.  FINDING OUT THE RESULTS OF YOUR TEST Not all test results are available during your visit. If your test results are not back during the visit, make an appointment  with your caregiver to find out the results. Do not assume everything is normal if you have not heard from your caregiver or the medical facility. It is important for you to follow up on all of your test results.  SEEK IMMEDIATE MEDICAL ATTENTION IF:  You have more than a spotting of blood in your stool.   Your belly is swollen (abdominal distention).   You are nauseated or vomiting.   You have a temperature over 101.   You have abdominal pain or discomfort that is severe or gets worse throughout the day.    CBC today.  Further recommendations to follow biopsy results.

## 2015-02-12 ENCOUNTER — Encounter: Payer: Self-pay | Admitting: Internal Medicine

## 2015-02-12 ENCOUNTER — Other Ambulatory Visit: Payer: Self-pay | Admitting: Internal Medicine

## 2015-02-12 ENCOUNTER — Other Ambulatory Visit: Payer: Self-pay | Admitting: Adult Health

## 2015-02-12 ENCOUNTER — Encounter (HOSPITAL_COMMUNITY): Payer: Self-pay | Admitting: Internal Medicine

## 2015-02-12 ENCOUNTER — Other Ambulatory Visit: Payer: Self-pay

## 2015-02-12 DIAGNOSIS — R195 Other fecal abnormalities: Secondary | ICD-10-CM

## 2015-02-12 DIAGNOSIS — R768 Other specified abnormal immunological findings in serum: Secondary | ICD-10-CM

## 2015-02-13 ENCOUNTER — Encounter (HOSPITAL_COMMUNITY)
Admission: RE | Admit: 2015-02-13 | Discharge: 2015-02-13 | Disposition: A | Discharge status: Home / Self Care | Payer: Federal, State, Local not specified - PPO | Source: Ambulatory Visit | Attending: Internal Medicine | Admitting: Internal Medicine

## 2015-02-13 DIAGNOSIS — R195 Other fecal abnormalities: Secondary | ICD-10-CM | POA: Insufficient documentation

## 2015-02-13 LAB — CBC
HCT: 43.8 % (ref 36.0–46.0)
Hemoglobin: 14.8 g/dL (ref 12.0–15.0)
MCH: 28.2 pg (ref 26.0–34.0)
MCHC: 33.8 g/dL (ref 30.0–36.0)
MCV: 83.4 fL (ref 78.0–100.0)
PLATELETS: 247 10*3/uL (ref 150–400)
RBC: 5.25 MIL/uL — ABNORMAL HIGH (ref 3.87–5.11)
RDW: 13.1 % (ref 11.5–15.5)
WBC: 8.5 10*3/uL (ref 4.0–10.5)

## 2015-05-05 ENCOUNTER — Other Ambulatory Visit: Payer: Self-pay | Admitting: Adult Health

## 2015-05-14 DIAGNOSIS — H16223 Keratoconjunctivitis sicca, not specified as Sjogren's, bilateral: Secondary | ICD-10-CM | POA: Diagnosis not present

## 2015-05-15 ENCOUNTER — Encounter: Payer: Self-pay | Admitting: Orthopaedic Surgery

## 2015-05-15 ENCOUNTER — Ambulatory Visit (INDEPENDENT_AMBULATORY_CARE_PROVIDER_SITE_OTHER): Payer: Federal, State, Local not specified - PPO | Admitting: Orthopaedic Surgery

## 2015-05-15 ENCOUNTER — Ambulatory Visit (INDEPENDENT_AMBULATORY_CARE_PROVIDER_SITE_OTHER): Payer: Federal, State, Local not specified - PPO

## 2015-05-15 VITALS — BP 126/77 | HR 80 | Temp 96.8°F | Ht 69.0 in | Wt 260.0 lb

## 2015-05-15 DIAGNOSIS — M25551 Pain in right hip: Secondary | ICD-10-CM

## 2015-05-15 DIAGNOSIS — M7061 Trochanteric bursitis, right hip: Secondary | ICD-10-CM | POA: Diagnosis not present

## 2015-05-15 MED ORDER — NAPROXEN 500 MG PO TABS: 500.0000 mg | ORAL_TABLET | Freq: Two times a day (BID) | ORAL | Status: DC

## 2015-05-15 NOTE — Progress Notes (Signed)
Subjective:  My right hip and lateral thigh hurt    Patient ID: Meghan [name], female    DOB: 14-Aug-1970, 45 y.o.   MRN: [id number]  Hip Pain  There was no injury mechanism. The pain is present in the right hip. The quality of the pain is described as aching. The pain is at a severity of 4/10. The pain is moderate. The pain has been worsening since onset. Pertinent negatives include no inability to bear weight, loss of motion, loss of sensation, muscle weakness, numbness or tingling. The symptoms are aggravated by palpation. She has tried heat, ice and NSAIDs for the symptoms. The treatment provided mild relief.  Back Pain Pertinent negatives include no chest pain, numbness or tingling.   She has had increasing pain of the right hip area.  Movement and bending make it worse.  She has no trauma, no paresthesias.  It is not getting any better.  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   Review of Systems  HENT: Negative for congestion.   Respiratory: Negative for cough and shortness of breath.   Cardiovascular: Negative for chest pain and leg swelling.  Endocrine: Negative for cold intolerance.  Musculoskeletal: Positive for back pain, arthralgias and gait problem.  Allergic/Immunologic: Negative for environmental allergies.  Neurological: Negative for tingling and numbness.   Past Medical History  Diagnosis Date  . PVC (premature ventricular contraction)   . Chest pain, unspecified   . Dyspnea   . Palpitations   . Obesity, unspecified   . Colitis     see psh for colonoscopy reports  . IBS (irritable bowel syndrome)   . Nephrolithiasis   . Generalized headaches   . Enlarged lymph node     right side - arm  . Abdominal pain     RLQ  . Family history of ovarian cancer     MGM  . Nausea   . Hiatal hernia   . IBS (irritable bowel syndrome)   . Menorrhagia 10/17/2012  . Endometrial polyp 11/09/2012  . Irregular bleeding  01/01/2013    Past Surgical History  Procedure Laterality Date  . Kidney stone surgery    . Knee arthroscopy  2008    ACL repair  . Colonoscopy with ileoscopy  2008    Dr. Laural Golden. patch of coarse mucosa at TI, bx  neg.  Random colon bx showed minimal active focal colitis ?resolving infection. Felt not c/w IBD.  [facility] Kitchen Colonoscopy  2005    Dr. Irving Shows. Colitis of cecum and ICV, bx nonspecific  . Esophagogastroduodenoscopy  02/2008    Dr. Gala Romney- birnak appearing tubular esophagus. small hiatal hernia, o/w normal stomach, D1, D2  . Dilitation & currettage/hystroscopy with thermachoice ablation N/A 12/01/2012    Procedure: DILATATION & CURETTAGE;REMOVAL OF ENDOMETRIAL POLYP; HYSTEROSCOPY WITH THERMACHOICE ENDOMETRIAL ABLATION;  Surgeon: Jonnie Kind, MD;  Location: [facility];  Service: Gynecology;  Laterality: N/A;  . Laparoscopic bilateral salpingectomy Bilateral 12/01/2012    Procedure: LAPAROSCOPIC BILATERAL SALPINGECTOMY ;  Surgeon: Jonnie Kind, MD;  Location: [facility];  Service: Gynecology;  Laterality: Bilateral;  . Lesion removal Left 12/01/2012    Procedure: SKIN TAG REMOVAL (Left inner thigh) ;  Surgeon: Jonnie Kind, MD;  Location: [facility];  Service: Gynecology;  Laterality: Left;  . Ablation      Uterine  . Colonoscopy N/A 02/10/2015    Procedure: COLONOSCOPY;  Surgeon: Daneil Dolin, MD;  Location: [facility];  Service: Endoscopy;  Laterality: N/A;  [id number]-[id number] to [id number] Office to notify    Current Outpatient Prescriptions on File Prior to Visit  Medication Sig Dispense Refill  . ALPRAZolam (XANAX) 0.5 MG tablet Take 0.5 mg by mouth at bedtime as needed. Sleep.    . baclofen (LIORESAL) 10 MG tablet TAKE ONE (1) TABLET EACH DAY AS NEEDED FOR ABDOMINAL AND SPINAL TIGHTNESS 30 tablet 6  . Biotin 1 MG CAPS Take 1 capsule by mouth as needed (when remembers).     . Multiple Vitamin (MULTIVITAMIN WITH MINERALS) TABS tablet Take 1 tablet by mouth daily.    . nortriptyline (PAMELOR)  10 MG capsule TAKE 1 CAPSULE BY MOUTH AT BEDTIME. 30 capsule 2   No current facility-administered medications on file prior to visit.    Social History   Social History  . Marital Status: Married    Spouse Name: Keenan Bachelor  . Number of Children: 2  . Years of Education: 14   Occupational History  . 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 [place]   Social History Main Topics  . Smoking status: Never Smoker   . Smokeless tobacco: Never Used  . Alcohol Use: No  . Drug Use: No  . Sexual Activity: Yes    Birth Control/ Protection: None, Other-see comments     Comment: vasectomy   Other Topics Concern  . Not on file   Social History Narrative   Patient is married Keenan Bachelor) and lives at home with her husband and two children.   Patient works full-time at [employer].   Patient has a Associates degree.   Patient is right-handed.   Patient drinks tea occasionally.          BP 126/77 mmHg  Pulse 80  Temp(Src) 96.8 F (36 C)  Ht 5\' 9"  (1.753 m)  Wt 260 lb (117.935 kg)  BMI 38.38 kg/m2      Objective:   Physical Exam  Constitutional: She is oriented to person, place, and time. She appears well-developed and well-nourished.  HENT:  Head: Normocephalic and atraumatic.  Eyes: Conjunctivae and EOM are normal. Pupils are equal, round, and reactive to light.  Neck: Normal range of motion. Neck supple.  Cardiovascular: Normal rate, regular rhythm and intact distal pulses.   Pulmonary/Chest: Effort normal.  Abdominal: Soft.  Musculoskeletal: She exhibits tenderness (The right hip over the trochanteric area has marked pain.  She has full motion and no limp of the hip on both sides.  There is no redness or lesion.).       Legs: Neurological: She is alert and oriented to person, place, and time. She displays normal reflexes. No cranial nerve deficit. She exhibits normal muscle tone. Coordination normal.  Skin: Skin is warm and dry.  Psychiatric: She has a normal mood  and affect. Her behavior is normal. Judgment and thought content normal.  Vitals reviewed.   X-rays were done and reported separately.     Assessment & Plan:   Encounter Diagnoses  Name Primary?  . Right hip pain Yes  . Trochanteric bursitis of right hip     PROCEDURE NOTE:  The patient request injection, verbal consent was obtained.  The right trochanteric area of the hip was prepped appropriately after time out was performed.   Sterile technique was observed and injection of 1 cc of Depo-Medrol 40 mg with several cc's of plain xylocaine. Anesthesia was provided by ethyl chloride  and a 20-gauge needle was used to inject the hip area. The injection was tolerated well.  A band aid dressing was applied.   The patient was advised to apply ice later today and tomorrow to the injection sight as needed.  Rx for Naprosyn given with precautions.  Return in two weeks.  Call if any problem.

## 2015-05-29 ENCOUNTER — Ambulatory Visit: Payer: Federal, State, Local not specified - PPO | Admitting: Orthopaedic Surgery

## 2015-06-23 ENCOUNTER — Ambulatory Visit (INDEPENDENT_AMBULATORY_CARE_PROVIDER_SITE_OTHER): Payer: Federal, State, Local not specified - PPO | Admitting: Neurology

## 2015-06-23 ENCOUNTER — Encounter: Payer: Self-pay | Admitting: Neurology

## 2015-06-23 VITALS — BP 126/78 | HR 86 | Resp 20 | Ht 69.0 in | Wt 257.0 lb

## 2015-06-23 DIAGNOSIS — G4719 Other hypersomnia: Secondary | ICD-10-CM | POA: Diagnosis not present

## 2015-06-23 DIAGNOSIS — F4541 Pain disorder exclusively related to psychological factors: Secondary | ICD-10-CM | POA: Diagnosis not present

## 2015-06-23 MED ORDER — NORTRIPTYLINE HCL 10 MG PO CAPS: 10.0000 mg | ORAL_CAPSULE | Freq: Every day | ORAL | Status: DC

## 2015-06-23 NOTE — Addendum Note (Signed)
Addended by: Larey Seat on: 06/23/2015 04:16 PM   Modules accepted: Orders

## 2015-06-23 NOTE — Patient Instructions (Signed)
Nortriptyline capsules What is this medicine? NORTRIPTYLINE (nor TRIP ti leen) is used to treat depression. This medicine may be used for other purposes; ask your health care provider or pharmacist if you have questions. What should I tell my health care provider before I take this medicine? They need to know if you have any of these conditions: -an alcohol problem -bipolar disorder or schizophrenia -difficulty passing urine, prostate trouble -glaucoma -heart disease or recent heart attack -liver disease -over active thyroid -seizures -thoughts or plans of suicide or a previous suicide attempt or family history of suicide attempt -an unusual or allergic reaction to nortriptyline, other medicines, foods, dyes, or preservatives -pregnant or trying to get pregnant -breast-feeding How should I use this medicine? Take this medicine by mouth with a glass of water. Follow the directions on the prescription label. Take your doses at regular intervals. Do not take it more often than directed. Do not stop taking this medicine suddenly except upon the advice of your doctor. Stopping this medicine too quickly may cause serious side effects or your condition may worsen. A special MedGuide will be given to you by the pharmacist with each prescription and refill. Be sure to read this information carefully each time. Talk to your pediatrician regarding the use of this medicine in children. Special care may be needed. Overdosage: If you think you have taken too much of this medicine contact a poison control center or emergency room at once. NOTE: This medicine is only for you. Do not share this medicine with others. What if I miss a dose? If you miss a dose, take it as soon as you can. If it is almost time for your next dose, take only that dose. Do not take double or extra doses. What may interact with this medicine? Do not take this medicine with any of the following medications: -arsenic  trioxide -certain medicines medicines for irregular heart beat -cisapride -halofantrine -linezolid -MAOIs like Carbex, Eldepryl, Marplan, Nardil, and Parnate -methylene blue (injected into a vein) -other medicines for mental depression -phenothiazines like perphenazine, thioridazine and chlorpromazine -pimozide -probucol -procarbazine -sparfloxacin -St. John's Wort -ziprasidone This medicine may also interact with any of the following medications: -atropine and related drugs like hyoscyamine, scopolamine, tolterodine and others -barbiturate medicines for inducing sleep or treating seizures, such as phenobarbital -cimetidine -medicines for diabetes -medicines for seizures like carbamazepine or phenytoin -reserpine -thyroid medicine This list may not describe all possible interactions. Give your health care provider a list of all the medicines, herbs, non-prescription drugs, or dietary supplements you use. Also tell them if you smoke, drink alcohol, or use illegal drugs. Some items may interact with your medicine. What should I watch for while using this medicine? Tell your doctor if your symptoms do not get better or if they get worse. Visit your doctor or health care professional for regular checks on your progress. Because it may take several weeks to see the full effects of this medicine, it is important to continue your treatment as prescribed by your doctor. Patients and their families should watch out for new or worsening thoughts of suicide or depression. Also watch out for sudden changes in feelings such as feeling anxious, agitated, panicky, irritable, hostile, aggressive, impulsive, severely restless, overly excited and hyperactive, or not being able to sleep. If this happens, especially at the beginning of treatment or after a change in dose, call your health care professional. Dennis Bast may get drowsy or dizzy. Do not drive, use machinery, or do anything  that needs mental alertness  until you know how this medicine affects you. Do not stand or sit up quickly, especially if you are an older patient. This reduces the risk of dizzy or fainting spells. Alcohol may interfere with the effect of this medicine. Avoid alcoholic drinks. Do not treat yourself for coughs, colds, or allergies without asking your doctor or health care professional for advice. Some ingredients can increase possible side effects. Your mouth may get dry. Chewing sugarless gum or sucking hard candy, and drinking plenty of water may help. Contact your doctor if the problem does not go away or is severe. This medicine may cause dry eyes and blurred vision. If you wear contact lenses you may feel some discomfort. Lubricating drops may help. See your eye doctor if the problem does not go away or is severe. This medicine can cause constipation. Try to have a bowel movement at least every 2 to 3 days. If you do not have a bowel movement for 3 days, call your doctor or health care professional. This medicine can make you more sensitive to the sun. Keep out of the sun. If you cannot avoid being in the sun, wear protective clothing and use sunscreen. Do not use sun lamps or tanning beds/booths. What side effects may I notice from receiving this medicine? Side effects that you should report to your doctor or health care professional as soon as possible: -allergic reactions like skin rash, itching or hives, swelling of the face, lips, or tongue -abnormal production of milk in females -breast enlargement in both males and females -breathing problems -confusion, hallucinations -fever with increased sweating -irregular or fast, pounding heartbeat -muscle stiffness, or spasms -pain or difficulty passing urine, loss of bladder control -seizures -suicidal thoughts or other mood changes -swelling of the testicles -tingling, pain, or numbness in the feet or hands -yellowing of the eyes or skin Side effects that usually do not  require medical attention (report to your doctor or health care professional if they continue or are bothersome): -change in sex drive or performance -diarrhea -nausea, vomiting -weight gain or loss This list may not describe all possible side effects. Call your doctor for medical advice about side effects. You may report side effects to FDA at 1-800-FDA-1088. Where should I keep my medicine? Keep out of the reach of children. Store at room temperature between 15 and 30 degrees C (59 and 86 degrees F). Keep container tightly closed. Throw away any unused medicine after the expiration date. NOTE: This sheet is a summary. It may not cover all possible information. If you have questions about this medicine, talk to your doctor, pharmacist, or health care provider.    2016, Elsevier/Gold Standard. (2011-05-24 13:57:12)

## 2015-06-23 NOTE — Progress Notes (Addendum)
PATIENT: Meghan [name] DOB: [birth year]  REASON FOR VISIT: follow up- migraine, abnormal MRI HISTORY FROM: patient  HISTORY OF PRESENT ILLNESS:  Meghan [name] .Meghan [name] is a 45 year old female with a history of migraine headaches and abnormal MRI. She returns today for follow-up. She states that she continues to take nortriptyline 10 mg at bedtime. She states that she has a headache "every once in a while." She feels that the nortriptyline has been very beneficial. She states that recently she spends suffering from insomnia. She states that even taken the Xanax in the nortriptyline does not help her with her sleep. She states that her husband does snore and is wondering if that prohibits her from falling asleep. The patient is also was set up for an appointment with rheumatology due to a elevated ANA. She states that she has appointment in January. She does state that she has increased stress right now. She denies any new neurological symptoms. She does report that she had blood or so and will have a colonoscopy next week. She returns today for an evaluation.  HISTORY 04/30/14: Meghan [name] is a 45 year old female with a history of migraines and abnormal MRI. She returns today for follow-up. The patient states that she is been taking nortriptyline 10 mg at bedtime and this has improved her headache frequency. She states in the last month she has had 1 headache. She states that she when she feels the headache coming on she can take Xanax and that usually resolves the headache as well. Her lab work did indicate an elevated ANA she had a follow-up with Dr.Deveshwar but her appointment got canceled. She states that she may need to be sent to another rheumatologist because Dr.Deveshwar has very limited hours. The patient continues to use baclofen as needed for squeezing sensation around the upper abdomen. She states this works well for her. She denies any new symptoms. She returns today for  evaluation.  Interval history from 06/23/2015, Meghan [name] reports that her headaches have been very well controlled with Pamelor, she takes only 10 mg at night and has found relief. But she remained excessively daytime sleepy and endorsed the Epworth sleepiness score today at 15 points fatigue severity score at 51 points. She does not feel depressed but fatigued she is unaware if she may snore or have apnea at least her husband has not told her so. She still has a high body mass index but efforts to lose weight with exercise has been hampered by an injury to the hip. I would like to suggest a home sleep test to rule out apnea. I will send her after today's visit to our front desk to arrange for a portable home sleep test machine. This is a screening test for apnea should apnea be present we will meet earlier instead of in a year to discuss the therapeutic options. Weight loss is always a good idea. She sleeps in any sleep position supine prone or lateral. She sleeps on only one pillow. The bedroom is cool, quiet and dark. When she comes home after an 8 hour day at work she feels already exhausted and ready to go to sleep. Again, Epworth 15 !  REVIEW OF SYSTEMS: Out of a complete 14 system review of symptoms, the patient complains only of the following symptoms, and all other reviewed systems are negative.  Fatigue, ringing in ears, constipation, diarrhea, nausea, insomnia, frequent waking, daytime sleepiness, neck pain, neck stiffness, joint pain, urgency, headache, numbness, speech difficulty  ALLERGIES: Allergies  Allergen Reactions  . Morphine Other (See Comments)    Neck pain  . Oxycodone-Acetaminophen Hives and Itching    Itching    HOME MEDICATIONS: Outpatient Prescriptions Prior to Visit  Medication Sig Dispense Refill  . ALPRAZolam (XANAX) 0.5 MG tablet Take 0.5 mg by mouth at bedtime as needed. Sleep.    . baclofen (LIORESAL) 10 MG tablet TAKE ONE (1) TABLET EACH DAY AS NEEDED FOR  ABDOMINAL AND SPINAL TIGHTNESS 30 tablet 6  . Biotin 1 MG CAPS Take 1 capsule by mouth as needed (when remembers).     . Multiple Vitamin (MULTIVITAMIN WITH MINERALS) TABS tablet Take 1 tablet by mouth daily.    . naproxen (NAPROSYN) 500 MG tablet Take 1 tablet (500 mg total) by mouth 2 (two) times daily with a meal. 60 tablet 5  . nortriptyline (PAMELOR) 10 MG capsule TAKE 1 CAPSULE BY MOUTH AT BEDTIME. 30 capsule 2   No facility-administered medications prior to visit.    PAST MEDICAL HISTORY: Past Medical History  Diagnosis Date  . PVC (premature ventricular contraction)   . Chest pain, unspecified   . Dyspnea   . Palpitations   . Obesity, unspecified   . Colitis     see psh for colonoscopy reports  . IBS (irritable bowel syndrome)   . Nephrolithiasis   . Generalized headaches   . Enlarged lymph node     right side - arm  . Abdominal pain     RLQ  . Family history of ovarian cancer     MGM  . Nausea   . Hiatal hernia   . IBS (irritable bowel syndrome)   . Menorrhagia 10/17/2012  . Endometrial polyp 11/09/2012  . Irregular bleeding 01/01/2013    PAST SURGICAL HISTORY: Past Surgical History  Procedure Laterality Date  . Kidney stone surgery    . Knee arthroscopy  2008    ACL repair  . Colonoscopy with ileoscopy  2008    Dr. Laural Golden. patch of coarse mucosa at TI, bx  neg.  Random colon bx showed minimal active focal colitis ?resolving infection. Felt not c/w IBD.  Marland Kitchen Colonoscopy  2005    Dr. Irving Shows. Colitis of cecum and ICV, bx nonspecific  . Esophagogastroduodenoscopy  02/2008    Dr. Gala Romney- birnak appearing tubular esophagus. small hiatal hernia, o/w normal stomach, D1, D2  . Dilitation & currettage/hystroscopy with thermachoice ablation N/A 12/01/2012    Procedure: DILATATION & CURETTAGE;REMOVAL OF ENDOMETRIAL POLYP; HYSTEROSCOPY WITH THERMACHOICE ENDOMETRIAL ABLATION;  Surgeon: Jonnie Kind, MD;  Location: [facility];  Service: Gynecology;  Laterality: N/A;  .  Laparoscopic bilateral salpingectomy Bilateral 12/01/2012    Procedure: LAPAROSCOPIC BILATERAL SALPINGECTOMY ;  Surgeon: Jonnie Kind, MD;  Location: [facility];  Service: Gynecology;  Laterality: Bilateral;  . Lesion removal Left 12/01/2012    Procedure: SKIN TAG REMOVAL (Left inner thigh) ;  Surgeon: Jonnie Kind, MD;  Location: [facility];  Service: Gynecology;  Laterality: Left;  . Ablation      Uterine  . Colonoscopy N/A 02/10/2015    Procedure: COLONOSCOPY;  Surgeon: Daneil Dolin, MD;  Location: [facility];  Service: Endoscopy;  Laterality: N/A;  0730-moved to 915 Office to notify    FAMILY HISTORY: Family History  Problem Relation Age of Onset  . Colon cancer Neg Hx   . Cancer Maternal Grandmother     ovarian cancer  . Diabetes Maternal Grandfather   . Other Sister  Transverse myelitis  . Scleroderma Mother   . Hypertension Father   . Stroke Paternal Grandmother   . Hypertension Paternal Grandmother     SOCIAL HISTORY: Social History   Social History  . Marital Status: Married    Spouse Name: Keenan Bachelor  . Number of Children: 2  . Years of Education: 14   Occupational History  . 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 [place]   Social History Main Topics  . Smoking status: Never Smoker   . Smokeless tobacco: Never Used  . Alcohol Use: No  . Drug Use: No  . Sexual Activity: Yes    Birth Control/ Protection: None, Other-see comments     Comment: vasectomy   Other Topics Concern  . Not on file   Social History Narrative   Patient is married Keenan Bachelor) and lives at home with her husband and two children.   Patient works full-time at [employer].   Patient has a Associates degree.   Patient is right-handed.   Patient drinks tea occasionally.            PHYSICAL EXAM  Filed Vitals:   06/23/15 1550  BP: 126/78  Pulse: 86  Resp: 20  Height: 5\' 9"  (1.753 m)  Weight: 257 lb (116.574 kg)   Body mass index is 37.93  kg/(m^2).  Generalized: Well developed, in no acute distress  Neurological examination  Mentation: Alert oriented to time, place, history taking. Follows all commands speech and language fluent Cranial nerve ; no change in smell or taste.   She describes vision decrease without pressure or pain. Sees OD regularly.  Pupils were equal round reactive to light. Extraocular movements were full, visual field were full on confrontational test. Facial sensation and strength were normal. Uvula tongue midline. Headturning and shoulder shrug  were normal and symmetric. Motor:  5 / 5 strength of all 4 extremities. Good symmetric motor tone is noted throughout. Sensory: Sensory testing is intact to soft touch on all 4 extremities.  Coordination: Cerebellar testing reveals good finger-nose-finger  bilaterally.  Gait and station: Gait is normal. Tandem gait is normal. Romberg is negative. Reflexes: Deep tendon reflexes are symmetric and normal bilaterally.   DIAGNOSTIC DATA (LABS, IMAGING, TESTING) - I reviewed patient records, labs, notes, testing and imaging myself where available.  Lab Results  Component Value Date   WBC 8.5 02/13/2015   HGB 14.8 02/13/2015   HCT 43.8 02/13/2015   MCV 83.4 02/13/2015   PLT 247 02/13/2015     Lab Results  Component Value Date   HGBA1C 5.3 10/07/2014      ASSESSMENT AND PLAN;  45 y.o. year old female with alleviated headaches.    1. Migraine headaches 2. Abnormal MRI 3. Insomnia  The patient will continue taking nortriptyline 10 mg at bedtime. I did advise the patient that she could increase the nortriptyline to 2 tablets at bedtime to see if this helps with her sleep.  Patient will continue to use baclofen as needed. I have spoken to the patient also about sleep hygiene.  She has no longer headaches.  She uses xanax to sleep, prn. She will keep up hydration.   Rv in 12 month with Meghan Givens, NP   Larey Seat, MD   06/23/2015, 4:02  PM [facility] [address], [place], [place] [address] [phone]

## 2015-08-18 ENCOUNTER — Encounter: Payer: Self-pay | Admitting: Adult Health

## 2015-08-18 DIAGNOSIS — R1084 Generalized abdominal pain: Secondary | ICD-10-CM | POA: Diagnosis not present

## 2015-08-18 DIAGNOSIS — R110 Nausea: Secondary | ICD-10-CM | POA: Diagnosis not present

## 2015-08-18 DIAGNOSIS — R351 Nocturia: Secondary | ICD-10-CM | POA: Diagnosis not present

## 2015-08-18 DIAGNOSIS — N39498 Other specified urinary incontinence: Secondary | ICD-10-CM | POA: Diagnosis not present

## 2015-09-16 DIAGNOSIS — K080 Exfoliation of teeth due to systemic causes: Secondary | ICD-10-CM | POA: Diagnosis not present

## 2015-09-26 DIAGNOSIS — E782 Mixed hyperlipidemia: Secondary | ICD-10-CM | POA: Diagnosis not present

## 2015-09-26 DIAGNOSIS — I10 Essential (primary) hypertension: Secondary | ICD-10-CM | POA: Diagnosis not present

## 2015-09-29 DIAGNOSIS — R1010 Upper abdominal pain, unspecified: Secondary | ICD-10-CM | POA: Diagnosis not present

## 2015-09-29 DIAGNOSIS — N39498 Other specified urinary incontinence: Secondary | ICD-10-CM | POA: Diagnosis not present

## 2015-09-29 DIAGNOSIS — H538 Other visual disturbances: Secondary | ICD-10-CM | POA: Diagnosis not present

## 2015-09-29 DIAGNOSIS — N3289 Other specified disorders of bladder: Secondary | ICD-10-CM | POA: Diagnosis not present

## 2015-10-06 ENCOUNTER — Encounter (HOSPITAL_COMMUNITY): Payer: Self-pay | Admitting: *Deleted

## 2015-10-06 ENCOUNTER — Emergency Department (HOSPITAL_COMMUNITY): Payer: Federal, State, Local not specified - PPO

## 2015-10-06 ENCOUNTER — Emergency Department (HOSPITAL_COMMUNITY)
Admission: EM | Admit: 2015-10-06 | Discharge: 2015-10-06 | Disposition: A | Discharge status: Home / Self Care | Payer: Federal, State, Local not specified - PPO | Attending: Emergency Medicine | Admitting: Emergency Medicine

## 2015-10-06 DIAGNOSIS — Z79899 Other long term (current) drug therapy: Secondary | ICD-10-CM | POA: Insufficient documentation

## 2015-10-06 DIAGNOSIS — R109 Unspecified abdominal pain: Secondary | ICD-10-CM | POA: Diagnosis present

## 2015-10-06 DIAGNOSIS — J9811 Atelectasis: Secondary | ICD-10-CM | POA: Diagnosis not present

## 2015-10-06 DIAGNOSIS — K219 Gastro-esophageal reflux disease without esophagitis: Secondary | ICD-10-CM

## 2015-10-06 LAB — CBC
HEMATOCRIT: 41.7 % (ref 36.0–46.0)
HEMOGLOBIN: 13.6 g/dL (ref 12.0–15.0)
MCH: 27.8 pg (ref 26.0–34.0)
MCHC: 32.6 g/dL (ref 30.0–36.0)
MCV: 85.1 fL (ref 78.0–100.0)
Platelets: 224 10*3/uL (ref 150–400)
RBC: 4.9 MIL/uL (ref 3.87–5.11)
RDW: 13 % (ref 11.5–15.5)
WBC: 8.9 10*3/uL (ref 4.0–10.5)

## 2015-10-06 LAB — DIFFERENTIAL
BASOS ABS: 0 10*3/uL (ref 0.0–0.1)
BASOS PCT: 0 %
Eosinophils Absolute: 0.1 10*3/uL (ref 0.0–0.7)
Eosinophils Relative: 2 %
LYMPHS PCT: 26 %
Lymphs Abs: 2.3 10*3/uL (ref 0.7–4.0)
MONOS PCT: 9 %
Monocytes Absolute: 0.7 10*3/uL (ref 0.1–1.0)
NEUTROS ABS: 5.4 10*3/uL (ref 1.7–7.7)
Neutrophils Relative %: 63 %

## 2015-10-06 LAB — HEPATIC FUNCTION PANEL
ALT: 16 U/L (ref 14–54)
AST: 33 U/L (ref 15–41)
Albumin: 4.3 g/dL (ref 3.5–5.0)
Alkaline Phosphatase: 69 U/L (ref 38–126)
BILIRUBIN DIRECT: 0.1 mg/dL (ref 0.1–0.5)
Indirect Bilirubin: 0.2 mg/dL — ABNORMAL LOW (ref 0.3–0.9)
TOTAL PROTEIN: 7.4 g/dL (ref 6.5–8.1)
Total Bilirubin: 0.3 mg/dL (ref 0.3–1.2)

## 2015-10-06 LAB — BASIC METABOLIC PANEL
ANION GAP: 8 (ref 5–15)
BUN: 23 mg/dL — ABNORMAL HIGH (ref 6–20)
CHLORIDE: 102 mmol/L (ref 101–111)
CO2: 26 mmol/L (ref 22–32)
Calcium: 9.2 mg/dL (ref 8.9–10.3)
Creatinine, Ser: 0.76 mg/dL (ref 0.44–1.00)
Glucose, Bld: 103 mg/dL — ABNORMAL HIGH (ref 65–99)
POTASSIUM: 3.6 mmol/L (ref 3.5–5.1)
SODIUM: 136 mmol/L (ref 135–145)

## 2015-10-06 LAB — URINALYSIS, ROUTINE W REFLEX MICROSCOPIC
Bilirubin Urine: NEGATIVE
GLUCOSE, UA: NEGATIVE mg/dL
LEUKOCYTES UA: NEGATIVE
Nitrite: NEGATIVE
PROTEIN: NEGATIVE mg/dL
pH: 6 (ref 5.0–8.0)

## 2015-10-06 LAB — URINE MICROSCOPIC-ADD ON

## 2015-10-06 LAB — LIPASE, BLOOD: LIPASE: 34 U/L (ref 11–51)

## 2015-10-06 LAB — I-STAT TROPONIN, ED: Troponin i, poc: 0 ng/mL (ref 0.00–0.08)

## 2015-10-06 MED ORDER — GI COCKTAIL ~~LOC~~
30.0000 mL | Freq: Once | ORAL | Status: AC
  Administered 2015-10-06: 30 mL via ORAL
  Filled 2015-10-06: qty 30

## 2015-10-06 MED ORDER — PANTOPRAZOLE SODIUM 40 MG PO TBEC: 40.0000 mg | DELAYED_RELEASE_TABLET | Freq: Every day | ORAL | 0 refills | Status: DC

## 2015-10-06 MED ORDER — PANTOPRAZOLE SODIUM 40 MG PO TBEC
40.0000 mg | DELAYED_RELEASE_TABLET | Freq: Once | ORAL | Status: AC
  Administered 2015-10-06: 40 mg via ORAL
  Filled 2015-10-06: qty 1

## 2015-10-06 NOTE — ED Triage Notes (Signed)
Pt c/o right flank pain that radiates to right chest; pt states she has some nausea with the pain

## 2015-10-06 NOTE — ED Notes (Signed)
Dr Roxanne Mins has evaluated

## 2015-10-06 NOTE — ED Provider Notes (Signed)
[facility] DEPT Provider Note   CSN: [id number] Arrival date & time: 10/06/15  0116     History   Chief Complaint Chief Complaint  Patient presents with  . Chest Pain    HPI Meghan [name] is a 45 y.o. female.She has a past history of kidney stones. Last night, she developed pain in the right flank area which she thought maybe a kidney stone. She has treated this with hydrocodone and ibuprofen without relief. During the day today, the pain started radiating to the right upper abdomen and into the right side of the chest. Pain is worse when she lays flat and better when she sits up. Eating does not affect. There is associated nausea but no vomiting. She denies fever or chills.  The history is provided by the patient.    Past Medical History:  Diagnosis Date  . Abdominal pain    RLQ  . Chest pain, unspecified   . Colitis    see psh for colonoscopy reports  . Dyspnea   . Endometrial polyp 11/09/2012  . Enlarged lymph node    right side - arm  . Family history of ovarian cancer    MGM  . Generalized headaches   . Hiatal hernia   . IBS (irritable bowel syndrome)   . IBS (irritable bowel syndrome)   . Irregular bleeding 01/01/2013  . Menorrhagia 10/17/2012  . Nausea   . Nephrolithiasis   . Obesity, unspecified   . Palpitations   . PVC (premature ventricular contraction)     Patient Active Problem List   Diagnosis Date Noted  . Stress headaches 06/23/2015  . Excessive daytime sleepiness 06/23/2015  . Morbid obesity due to excess calories ([facility]) 06/23/2015  . Change in bowel habits   . Heme + stool   . Heme positive stool 12/20/2014  . Bowel habit changes 12/20/2014  . Dysesthesia of multiple sites 08/29/2013  . Migraine without aura and without status migrainosus, not intractable 08/01/2013  . Irregular bleeding 01/01/2013  . Endometrial polyp 11/09/2012  . Menorrhagia 10/17/2012  . Upper abdominal pain 10/06/2011  . GERD (gastroesophageal reflux disease)  08/21/2010  . Abdominal pain 08/21/2010  . Cervical lymphadenopathy 08/21/2010  . Localized swelling, mass, or lump of upper extremity 08/21/2010  . Low back pain radiating to both legs 08/19/2010  . Neck pain, chronic 08/19/2010  . Numbness and tingling in hands 08/19/2010  . PREMATURE VENTRICULAR CONTRACTIONS 07/17/2008  . PALPITATIONS 07/17/2008  . DYSPNEA 07/17/2008  . CHEST PAIN-UNSPECIFIED 07/17/2008  . OBESITY 09/21/2007  . COLITIS 09/21/2007  . CONSTIPATION 09/21/2007  . IBS 09/21/2007  . NAUSEA 09/21/2007  . Diarrhea 09/21/2007  . Abdominal pain 09/21/2007    Past Surgical History:  Procedure Laterality Date  . ABLATION     Uterine  . COLONOSCOPY  2005   Dr. Irving Shows. Colitis of cecum and ICV, bx nonspecific  . COLONOSCOPY N/A 02/10/2015   Procedure: COLONOSCOPY;  Surgeon: Daneil Dolin, MD;  [facility];  Service: Endoscopy;  Laterality: N/A;  0730-moved to 915 Office to notify  . colonoscopy with ileoscopy  2008   Dr. Laural Golden. patch of coarse mucosa at TI, bx  neg.  Random colon bx showed minimal active focal colitis ?resolving infection. Felt not c/w IBD.  Marland Kitchen DILITATION & CURRETTAGE/HYSTROSCOPY WITH THERMACHOICE ABLATION N/A 12/01/2012   Procedure: DILATATION & CURETTAGE;REMOVAL OF ENDOMETRIAL POLYP; HYSTEROSCOPY WITH THERMACHOICE ENDOMETRIAL ABLATION;  Surgeon: Jonnie Kind, MD;  Location: [facility];  Service: Gynecology;  Laterality: N/A;  . ESOPHAGOGASTRODUODENOSCOPY  02/2008   Dr. Gala Romney- birnak appearing tubular esophagus. small hiatal hernia, o/w normal stomach, D1, D2  . KIDNEY STONE SURGERY    . KNEE ARTHROSCOPY  2008   ACL repair  . LAPAROSCOPIC BILATERAL SALPINGECTOMY Bilateral 12/01/2012   Procedure: LAPAROSCOPIC BILATERAL SALPINGECTOMY ;  Surgeon: Jonnie Kind, MD;  Location: [facility];  Service: Gynecology;  Laterality: Bilateral;  . LESION REMOVAL Left 12/01/2012   Procedure: SKIN TAG REMOVAL (Left inner thigh) ;  Surgeon: Jonnie Kind,  MD;  Location: [facility];  Service: Gynecology;  Laterality: Left;    OB History    Gravida Para Term Preterm AB Living   2 2       2    SAB TAB Ectopic Multiple Live Births           2       Home Medications    Prior to Admission medications   Medication Sig Start Date End Date Taking? Authorizing Provider  ALPRAZolam Duanne Moron) 0.5 MG tablet Take 0.5 mg by mouth at bedtime as needed. Sleep. 09/24/11   Historical Provider, MD  baclofen (LIORESAL) 10 MG tablet TAKE ONE (1) TABLET EACH DAY AS NEEDED FOR ABDOMINAL AND SPINAL TIGHTNESS 04/30/14   [name], NP  Biotin 1 MG CAPS Take 1 capsule by mouth as needed (when remembers).     Historical Provider, MD  Multiple Vitamin (MULTIVITAMIN WITH MINERALS) TABS tablet Take 1 tablet by mouth daily.    Historical Provider, MD  naproxen (NAPROSYN) 500 MG tablet Take 1 tablet (500 mg total) by mouth 2 (two) times daily with a meal. 05/15/15   Sanjuana Kava, MD  nortriptyline (PAMELOR) 10 MG capsule Take 1 capsule (10 mg total) by mouth at bedtime. 06/23/15   Asencion Partridge Dohmeier, MD  RESTASIS 0.05 % ophthalmic emulsion  05/15/15   Historical Provider, MD    Family History Family History  Problem Relation Age of Onset  . Cancer Maternal Grandmother     ovarian cancer  . Diabetes Maternal Grandfather   . Scleroderma Mother   . Hypertension Father   . Stroke Paternal Grandmother   . Hypertension Paternal Grandmother   . Other Sister     Transverse myelitis  . Colon cancer Neg Hx     Social History Social History  Substance Use Topics  . Smoking status: Never Smoker  . Smokeless tobacco: Never Used  . Alcohol use No     Allergies   Morphine and Oxycodone-acetaminophen   Review of Systems Review of Systems  All other systems reviewed and are negative.    Physical Exam Updated Vital Signs BP 125/59   Pulse 96   Temp 97.7 F (36.5 C) (Oral)   Resp 16   Ht 5\' 9"  (1.753 m)   Wt 260 lb (117.9 kg)   LMP 09/15/2015   SpO2 99%   BMI  38.40 kg/m   Physical Exam  Nursing note and vitals reviewed.  45 year old female, resting comfortably and in no acute distress. Vital signs are normal. Oxygen saturation is 99%, which is normal. Head is normocephalic and atraumatic. PERRLA, EOMI. Oropharynx is clear. Neck is nontender and supple without adenopathy or JVD. Back is nontender and there is no CVA tenderness. Lungs are clear without rales, wheezes, or rhonchi. Chest is nontender. Heart has regular rate and rhythm without murmur. Abdomen is soft, flat, with mild to moderate tenderness in the epigastrium and right upper quadrant without rebound or guarding.  There are no masses or hepatosplenomegaly and peristalsis is hypoactive. Extremities have no cyanosis or edema, full range of motion is present. Skin is warm and dry without rash. Neurologic: Mental status is normal, cranial nerves are intact, there are no motor or sensory deficits.  ED Treatments / Results  Labs (all labs ordered are listed, but only abnormal results are displayed) Labs Reviewed  BASIC METABOLIC PANEL - Abnormal; Notable for the following:       Result Value   Glucose, Bld 103 (*)    BUN 23 (*)    All other components within normal limits  HEPATIC FUNCTION PANEL - Abnormal; Notable for the following:    Indirect Bilirubin 0.2 (*)    All other components within normal limits  URINALYSIS, ROUTINE W REFLEX MICROSCOPIC (NOT AT [facility]) - Abnormal; Notable for the following:    Specific Gravity, Urine >1.030 (*)    Hgb urine dipstick TRACE (*)    Ketones, ur TRACE (*)    All other components within normal limits  URINE MICROSCOPIC-ADD ON - Abnormal; Notable for the following:    Squamous Epithelial / LPF 0-5 (*)    Bacteria, UA FEW (*)    All other components within normal limits  CBC  LIPASE, BLOOD  DIFFERENTIAL  I-STAT TROPOININ, ED    EKG  EKG Interpretation  Date/Time:  Monday October 06 2015 01:32:48 EDT Ventricular Rate:  90 PR  Interval:    QRS Duration: 98 QT Interval:  372 QTC Calculation: 456 R Axis:   21 Text Interpretation:  Sinus rhythm RSR' in V1 or V2, right VCD or RVH When compared with ECG of 10/24/2011, No significant change was found Confirmed by [facility]  MD, Kaidynce Pfister ([id number]) on 10/06/2015 1:41:51 AM       Radiology Dg Chest 2 View  Result Date: 10/06/2015 CLINICAL DATA:  Right flank pain since yesterday. Now right chest pain. EXAM: CHEST  2 VIEW COMPARISON:  11/30/2010 FINDINGS: Shallow inspiration with atelectasis in the lung bases. Normal heart size and pulmonary vascularity. No focal airspace disease or consolidation in the lungs. No blunting of costophrenic angles. No pneumothorax. Mediastinal contours appear intact. IMPRESSION: Shallow inspiration with atelectasis in the lung bases. Electronically Signed   By: Lucienne Capers M.D.   On: 10/06/2015 02:35    Procedures Procedures (including critical care time)  Medications Ordered in ED Medications  pantoprazole (PROTONIX) EC tablet 40 mg (not administered)  gi cocktail (Maalox,Lidocaine,Donnatal) (30 mLs Oral Given 10/06/15 0228)     Initial Impression / Assessment and Plan / ED Course  I have reviewed the triage vital signs and the nursing notes.  Pertinent labs & imaging results that were available during my care of the patient were reviewed by me and considered in my medical decision making (see chart for details).  Clinical Course    Right-sided pain which seems to be centered in the right upper quadrant and epigastric area. Suspect gastritis or GERD. Old records are reviewed and CT of abdomen and pelvis done in 2016 showed no evidence of nephrolithiasis. She'll be given a therapeutic trial of a GI cocktail.  She had excellent relief of symptoms with GI cocktail. She is given a dose of pantoprazole orally. Laboratory workup is unremarkable, and chest x-ray is unremarkable. She is discharged with prescription for pantoprazole.  Final  Clinical Impressions(s) / ED Diagnoses   Final diagnoses:  GERD without esophagitis    New Prescriptions New Prescriptions   PANTOPRAZOLE (PROTONIX) 40 MG TABLET  Take 1 tablet (40 mg total) by mouth daily.     Delora Fuel, MD [id number]

## 2015-10-06 NOTE — ED Notes (Signed)
Pt reports flank pain starting yesterday , worse with movement and tonight R sided chest pain - She reports a history of kidney stones but states that this feels different

## 2015-10-06 NOTE — ED Notes (Signed)
From Xray 

## 2015-10-06 NOTE — ED Notes (Signed)
Pt reports RUQ/Chest pain improvement, but continues to have R flank pain. She states that she does not have pain with inspiration

## 2015-10-06 NOTE — ED Notes (Signed)
Spouse to bedside 

## 2015-10-09 ENCOUNTER — Other Ambulatory Visit (HOSPITAL_COMMUNITY): Payer: Self-pay | Admitting: Internal Medicine

## 2015-10-09 DIAGNOSIS — N200 Calculus of kidney: Secondary | ICD-10-CM

## 2015-10-14 ENCOUNTER — Ambulatory Visit (HOSPITAL_COMMUNITY)
Admission: RE | Admit: 2015-10-14 | Discharge: 2015-10-14 | Disposition: A | Discharge status: Home / Self Care | Payer: Federal, State, Local not specified - PPO | Source: Ambulatory Visit | Attending: Internal Medicine | Admitting: Internal Medicine

## 2015-10-14 DIAGNOSIS — N200 Calculus of kidney: Secondary | ICD-10-CM

## 2015-10-14 DIAGNOSIS — D259 Leiomyoma of uterus, unspecified: Secondary | ICD-10-CM | POA: Insufficient documentation

## 2015-12-22 DIAGNOSIS — R9089 Other abnormal findings on diagnostic imaging of central nervous system: Secondary | ICD-10-CM | POA: Diagnosis not present

## 2016-01-05 ENCOUNTER — Other Ambulatory Visit (HOSPITAL_COMMUNITY)
Admission: RE | Admit: 2016-01-05 | Discharge: 2016-01-05 | Disposition: A | Discharge status: Home / Self Care | Payer: Federal, State, Local not specified - PPO | Source: Ambulatory Visit | Attending: Adult Health | Admitting: Adult Health

## 2016-01-05 ENCOUNTER — Encounter: Payer: Self-pay | Admitting: Adult Health

## 2016-01-05 ENCOUNTER — Ambulatory Visit (INDEPENDENT_AMBULATORY_CARE_PROVIDER_SITE_OTHER): Payer: Federal, State, Local not specified - PPO | Admitting: Adult Health

## 2016-01-05 VITALS — BP 128/68 | HR 78 | Ht 68.0 in | Wt 271.5 lb

## 2016-01-05 DIAGNOSIS — Z713 Dietary counseling and surveillance: Secondary | ICD-10-CM | POA: Diagnosis not present

## 2016-01-05 DIAGNOSIS — Z6841 Body Mass Index (BMI) 40.0 and over, adult: Secondary | ICD-10-CM

## 2016-01-05 DIAGNOSIS — Z1212 Encounter for screening for malignant neoplasm of rectum: Secondary | ICD-10-CM | POA: Diagnosis not present

## 2016-01-05 DIAGNOSIS — Z1151 Encounter for screening for human papillomavirus (HPV): Secondary | ICD-10-CM | POA: Diagnosis not present

## 2016-01-05 DIAGNOSIS — Z01411 Encounter for gynecological examination (general) (routine) with abnormal findings: Secondary | ICD-10-CM

## 2016-01-05 DIAGNOSIS — Z1211 Encounter for screening for malignant neoplasm of colon: Secondary | ICD-10-CM

## 2016-01-05 DIAGNOSIS — Z01419 Encounter for gynecological examination (general) (routine) without abnormal findings: Secondary | ICD-10-CM | POA: Insufficient documentation

## 2016-01-05 LAB — HEMOCCULT GUIAC POC 1CARD (OFFICE): Fecal Occult Blood, POC: NEGATIVE

## 2016-01-05 NOTE — Progress Notes (Signed)
Patient ID: Meghan [name], female   DOB: 1971-01-06, 45 y.o.   MRN: [id number] History of Present Illness: Meghan [name] is a 45 year old white female, married in for well woman gyn and pap. PCP is Wende Neighbors and she had labs with him.    Current Medications, Allergies, Past Medical History, Past Surgical History, Family History and Social History were reviewed in [employer] record.     Review of Systems: Patient denies any daily headaches(on meds), hearing loss, fatigue, blurred vision, shortness of breath, abdominal pain, problems with bowel movements(has IBS), urination, or intercourse. No joint pain or mood swings.Has some chest pain with exertion, like walking up hills, was seen in ER in September, and was seen by Dr Verl Blalock years ago, if persists may get consult. Wants to lose weight. Has see red blood after BM but had normal colonoscopy this year by Dr Gala Romney.   Physical Exam:BP 128/68 (BP Location: Left Arm, Patient Position: Sitting, Cuff Size: Large)   Pulse 78   Ht 5\' 8"  (1.727 m)   Wt 271 lb 8 oz (123.2 kg)   LMP 01/01/2016 (Exact Date)   BMI 41.28 kg/m  General:  Well developed, well nourished, no acute distress Skin:  Warm and dry Neck:  Midline trachea, normal thyroid, good ROM, no lymphadenopathy Lungs; Clear to auscultation bilaterally Breast:  No dominant palpable mass, retraction, or nipple discharge Cardiovascular: Regular rate and rhythm Abdomen:  Soft, non tender, no hepatosplenomegaly Pelvic:  External genitalia is normal in appearance, no lesions.  The vagina is normal in appearance. Urethra has no lesions or masses. The cervix is bulbous.Pap with HPV performed.  Uterus is felt to be normal size, shape, and contour.  No adnexal masses or tenderness noted.Bladder is non tender, no masses felt. Rectal: Good sphincter tone, no polyps, + hemorrhoids felt.  Hemoccult negative. Extremities/musculoskeletal:  No swelling or varicosities noted, no clubbing  or cyanosis Psych:  No mood changes, alert and cooperative,seems happy PHQ 2 score 0.  Impression: 1. Encounter for gynecological examination with Papanicolaou smear of cervix   2. Body mass index 40.0-44.9, adult ([place])   3. Weight loss counseling, encounter for   4. Encounter for colorectal cancer screening       Plan: Get mammogram now and yearly Physical in 1 year Pap in 3 if normal Try weight watchers and tread mill

## 2016-01-05 NOTE — Patient Instructions (Signed)
Get mammogram now and yearly Physical in 1 year Pap in 3 if normal Try weight watchers and tread mill

## 2016-01-07 LAB — CYTOLOGY - PAP
DIAGNOSIS: NEGATIVE
HPV: NOT DETECTED

## 2016-02-03 DIAGNOSIS — L820 Inflamed seborrheic keratosis: Secondary | ICD-10-CM | POA: Diagnosis not present

## 2016-02-17 ENCOUNTER — Encounter: Payer: Self-pay | Admitting: Internal Medicine

## 2016-04-07 DIAGNOSIS — K080 Exfoliation of teeth due to systemic causes: Secondary | ICD-10-CM | POA: Diagnosis not present

## 2016-04-20 DIAGNOSIS — K080 Exfoliation of teeth due to systemic causes: Secondary | ICD-10-CM | POA: Diagnosis not present

## 2016-06-23 ENCOUNTER — Ambulatory Visit: Payer: Federal, State, Local not specified - PPO | Admitting: Neurology

## 2016-07-31 ENCOUNTER — Other Ambulatory Visit: Payer: Self-pay | Admitting: Neurology

## 2016-07-31 DIAGNOSIS — F4541 Pain disorder exclusively related to psychological factors: Secondary | ICD-10-CM

## 2016-09-14 ENCOUNTER — Other Ambulatory Visit (HOSPITAL_COMMUNITY)
Admission: RE | Admit: 2016-09-14 | Discharge: 2016-09-14 | Disposition: A | Discharge status: Home / Self Care | Payer: Federal, State, Local not specified - PPO | Source: Ambulatory Visit | Attending: Internal Medicine | Admitting: Internal Medicine

## 2016-09-14 DIAGNOSIS — Z0000 Encounter for general adult medical examination without abnormal findings: Secondary | ICD-10-CM | POA: Insufficient documentation

## 2016-09-14 LAB — CBC
HEMATOCRIT: 40.8 % (ref 36.0–46.0)
HEMOGLOBIN: 13.5 g/dL (ref 12.0–15.0)
MCH: 28.3 pg (ref 26.0–34.0)
MCHC: 33.1 g/dL (ref 30.0–36.0)
MCV: 85.5 fL (ref 78.0–100.0)
Platelets: 223 10*3/uL (ref 150–400)
RBC: 4.77 MIL/uL (ref 3.87–5.11)
RDW: 12.8 % (ref 11.5–15.5)
WBC: 6.5 10*3/uL (ref 4.0–10.5)

## 2016-09-14 LAB — COMPREHENSIVE METABOLIC PANEL
ALBUMIN: 4.1 g/dL (ref 3.5–5.0)
ALK PHOS: 52 U/L (ref 38–126)
ALT: 14 U/L (ref 14–54)
ANION GAP: 5 (ref 5–15)
AST: 30 U/L (ref 15–41)
BILIRUBIN TOTAL: 0.5 mg/dL (ref 0.3–1.2)
BUN: 16 mg/dL (ref 6–20)
CALCIUM: 8.7 mg/dL — AB (ref 8.9–10.3)
CO2: 26 mmol/L (ref 22–32)
CREATININE: 0.61 mg/dL (ref 0.44–1.00)
Chloride: 106 mmol/L (ref 101–111)
GFR calc non Af Amer: >60 mL/min (ref >60)
GLUCOSE: 87 mg/dL (ref 65–99)
Potassium: 3.9 mmol/L (ref 3.5–5.1)
Sodium: 137 mmol/L (ref 135–145)
TOTAL PROTEIN: 7.1 g/dL (ref 6.5–8.1)

## 2016-09-14 LAB — LIPID PANEL
Cholesterol: 163 mg/dL (ref 0–200)
HDL: 56 mg/dL (ref >40)
LDL Cholesterol: 97 mg/dL (ref 0–99)
Total CHOL/HDL Ratio: 2.9 RATIO
Triglycerides: 51 mg/dL (ref <150)
VLDL: 10 mg/dL (ref 0–40)

## 2016-09-16 DIAGNOSIS — R109 Unspecified abdominal pain: Secondary | ICD-10-CM | POA: Diagnosis not present

## 2016-09-16 DIAGNOSIS — R51 Headache: Secondary | ICD-10-CM | POA: Diagnosis not present

## 2016-09-16 DIAGNOSIS — N3010 Interstitial cystitis (chronic) without hematuria: Secondary | ICD-10-CM | POA: Diagnosis not present

## 2016-09-16 DIAGNOSIS — G4700 Insomnia, unspecified: Secondary | ICD-10-CM | POA: Diagnosis not present

## 2016-09-17 DIAGNOSIS — E039 Hypothyroidism, unspecified: Secondary | ICD-10-CM | POA: Diagnosis not present

## 2016-10-04 ENCOUNTER — Ambulatory Visit (INDEPENDENT_AMBULATORY_CARE_PROVIDER_SITE_OTHER): Payer: Federal, State, Local not specified - PPO | Admitting: Neurology

## 2016-10-04 ENCOUNTER — Encounter: Payer: Self-pay | Admitting: Neurology

## 2016-10-04 VITALS — BP 120/75 | HR 78 | Ht 69.0 in | Wt 266.0 lb

## 2016-10-04 DIAGNOSIS — R200 Anesthesia of skin: Secondary | ICD-10-CM

## 2016-10-04 DIAGNOSIS — R9089 Other abnormal findings on diagnostic imaging of central nervous system: Secondary | ICD-10-CM | POA: Diagnosis not present

## 2016-10-04 NOTE — Progress Notes (Signed)
PATIENT: Meghan [name] DOB: [birth year]  REASON FOR VISIT: follow up- migraine, abnormal MRI HISTORY FROM: patient  HISTORY OF PRESENT ILLNESS: Interval history from 10/04/2016. 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 She continues to take nortriptyline 10 mg but I asked her today not to take it at bedtime but 30-40 minutes before. This should help with the few nights that she still has insomnia. Her headaches have been well controlled she returns today for routine follow-up and refill. The patient reports that she sleeps through the night, she does not have reduced sleep time or efficiency, and her headaches have not interfered with sleep. She has had 3 spots on the brain MRI , possible white matter vascular origin, but had an LP - no oligoclonals. She now reports her numbness in the index finger of her  right hand has been permanent- cervical spine changes?      2017, June -Meghan [name] .Meghan [name] is a 46 year old female with a history of migraine headaches and abnormal MRI. She returns today for follow-up. She states that she continues to take nortriptyline 10 mg at bedtime. She states that she has a headache "every once in a while." She feels that the nortriptyline has been very beneficial. She states that recently she spends suffering from insomnia.  She states that even taken the Xanax in the nortriptyline does not help her with her sleep. She states that her husband does snore and is wondering if that prohibits her from falling asleep. The patient is also was set up for an appointment with rheumatology due to a elevated ANA. She states that she has appointment in January. She does state that she has increased stress right now. She denies any new neurological symptoms. She does report that she had blood or so and will have a colonoscopy  next week. She returns today for an evaluation.  HISTORY 04/30/14: Meghan [name] is a 46 year old female with a history of migraines and abnormal MRI. She returns today for follow-up. The patient states that she is been taking nortriptyline 10 mg at bedtime and this has improved her headache frequency. She states in the last month she has had 1 headache. She states that she when she feels the headache coming on she can take Xanax and that usually resolves the headache as well. Her lab work did indicate an elevated ANA she had a follow-up with Dr.Deveshwar but her appointment got canceled. She states that she may need to be sent to another rheumatologist because Dr.Deveshwar has very limited hours. The patient continues to use baclofen as needed for squeezing sensation around the upper abdomen. She states this works well for her. She denies any new symptoms. She returns today for evaluation.  Interval history from 06/23/2015, Meghan [name] reports that her headaches have been very well controlled with Pamelor, she takes only 10 mg at night and has found relief. But she remained excessively daytime sleepy and endorsed the Epworth sleepiness score today at 15 points fatigue severity score at 51 points. She does not feel depressed but fatigued she is unaware if she may snore or have apnea at least her husband has not told her so. She still has a high body mass index but efforts to lose weight with exercise has been hampered by an injury to the hip. I would like to suggest a home sleep test  to rule out apnea. I will send her after today's visit to our front desk to arrange for a portable home sleep test machine. This is a screening test for apnea should apnea be present we will meet earlier instead of in a year to discuss the therapeutic options. Weight loss is always a good idea. She sleeps in any sleep position supine prone or lateral. She sleeps on only one pillow. The bedroom is cool, quiet and dark. When she comes  home after an 8 hour day at work she feels already exhausted and ready to go to sleep. Again, Epworth 15 !  REVIEW OF SYSTEMS: Out of a complete 14 system review of symptoms, the patient complains only of the following symptoms, and all other reviewed systems are negative.  Fatigue, ringing in ears, constipation, diarrhea, nausea, insomnia, frequent waking, daytime sleepiness, neck pain, neck stiffness,migraine.numbness,  ALLERGIES: Allergies  Allergen Reactions  . Morphine Other (See Comments)    Neck pain  . Oxycodone-Acetaminophen Hives and Itching    Itching    HOME MEDICATIONS: Outpatient Medications Prior to Visit  Medication Sig Dispense Refill  . ALPRAZolam (XANAX) 0.5 MG tablet Take 0.5 mg by mouth at bedtime as needed. Sleep.    . Biotin 1 MG CAPS Take 1 capsule by mouth as needed (when remembers).     . Multiple Vitamin (MULTIVITAMIN WITH MINERALS) TABS tablet Take 1 tablet by mouth daily.    . naproxen (NAPROSYN) 500 MG tablet Take 1 tablet (500 mg total) by mouth 2 (two) times daily with a meal. 60 tablet 5  . nortriptyline (PAMELOR) 10 MG capsule TAKE ONE CAPSULE BY MOUTH AT BEDTIME 90 capsule 0  . pantoprazole (PROTONIX) 40 MG tablet Take 1 tablet (40 mg total) by mouth daily. 30 tablet 0  . RESTASIS 0.05 % ophthalmic emulsion Place 1 drop into both eyes as needed.      No facility-administered medications prior to visit.     PAST MEDICAL HISTORY: Past Medical History:  Diagnosis Date  . Abdominal pain    RLQ  . Chest pain, unspecified   . Colitis    see psh for colonoscopy reports  . Dyspnea   . Endometrial polyp 11/09/2012  . Enlarged lymph node    right side - arm  . Family history of ovarian cancer    MGM  . Generalized headaches   . Hemorrhoids   . Hiatal hernia   . IBS (irritable bowel syndrome)   . IBS (irritable bowel syndrome)   . Irregular bleeding 01/01/2013  . Menorrhagia 10/17/2012  . Nausea   . Nephrolithiasis   . Obesity, unspecified     . Palpitations   . PVC (premature ventricular contraction)     PAST SURGICAL HISTORY: Past Surgical History:  Procedure Laterality Date  . ABLATION     Uterine  . COLONOSCOPY  2005   Dr. Irving Shows. Colitis of cecum and ICV, bx nonspecific  . COLONOSCOPY N/A 02/10/2015   Procedure: COLONOSCOPY;  Surgeon: Daneil Dolin, MD;  Location: [facility];  Service: Endoscopy;  Laterality: N/A;  0730-moved to 915 Office to notify  . colonoscopy with ileoscopy  2008   Dr. Laural Golden. patch of coarse mucosa at TI, bx  neg.  Random colon bx showed minimal active focal colitis ?resolving infection. Felt not c/w IBD.  Marland Kitchen DILITATION & CURRETTAGE/HYSTROSCOPY WITH THERMACHOICE ABLATION N/A 12/01/2012   Procedure: DILATATION & CURETTAGE;REMOVAL OF ENDOMETRIAL POLYP; HYSTEROSCOPY WITH THERMACHOICE ENDOMETRIAL ABLATION;  Surgeon: Angelyn Punt  Glo Herring, MD;  Location: [facility];  Service: Gynecology;  Laterality: N/A;  . ESOPHAGOGASTRODUODENOSCOPY  02/2008   Dr. Gala Romney- birnak appearing tubular esophagus. small hiatal hernia, o/w normal stomach, D1, D2  . KIDNEY STONE SURGERY    . KNEE ARTHROSCOPY  2008   ACL repair  . LAPAROSCOPIC BILATERAL SALPINGECTOMY Bilateral 12/01/2012   Procedure: LAPAROSCOPIC BILATERAL SALPINGECTOMY ;  Surgeon: Jonnie Kind, MD;  Location: [facility];  Service: Gynecology;  Laterality: Bilateral;  . LESION REMOVAL Left 12/01/2012   Procedure: SKIN TAG REMOVAL (Left inner thigh) ;  Surgeon: Jonnie Kind, MD;  Location: [facility];  Service: Gynecology;  Laterality: Left;    FAMILY HISTORY: Family History  Problem Relation Age of Onset  . Cancer Maternal Grandmother        ovarian cancer  . Diabetes Maternal Grandfather   . Scleroderma Mother   . Hypertension Father   . Stroke Paternal Grandmother   . Hypertension Paternal Grandmother   . Other Sister        Transverse myelitis  . Colon cancer Neg Hx     SOCIAL HISTORY: Social History   Social History  . Marital status: Married     Spouse name: Keenan Bachelor  . Number of children: 2  . Years of education: 14   Occupational History  . 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 [employer]   Social History Main Topics  . Smoking status: Never Smoker  . Smokeless tobacco: Never Used  . Alcohol use 0.0 oz/week     Comment: occ  . Drug use: No  . Sexual activity: Yes    Birth control/ protection: None, Other-see comments     Comment: vasectomy   Other Topics Concern  . Not on file   Social History Narrative   Patient is married Keenan Bachelor) and lives at home with her husband and two children.   Patient works full-time at [employer].   Patient has a Associates degree.   Patient is right-handed.   Patient drinks tea occasionally.            PHYSICAL EXAM  Vitals:   10/04/16 1056  BP: 120/75  Pulse: 78  Weight: 266 lb (120.7 kg)  Height: 5\' 9"  (1.753 m)   Body mass index is 39.28 kg/m.  Generalized: Well developed, in no acute distress  Neurological examination  Mentation: Alert oriented to time, place, history taking. Follows all commands speech and language fluent Cranial nerve ; no change in smell or taste.   She describes vision decrease without pressure or pain. Sees OD regularly- appointment tomorrow.   Pupils were equal in size round reactive to light and accomadation. Extraocular movements were full, visual field were full on confrontational test.  Facial sensation and strength were normal. Uvula and  tongue are in midline. Headturning and shoulder shrug  were normal and symmetric. Motor:full strength of all 4 extremities. Grip strength weakness. - right dominant hand has less grip and less pinch strength.  Sensory: Sensory testing is intact to soft touch on all 4 extremities.She reports the tip of her right index finger is numb.   Coordination: Cerebellar testing reveals good finger-nose-finger  bilaterally.  Gait and station: Gait is normal. Reflexes: Deep tendon reflexes  are symmetric and normal bilaterally.   DIAGNOSTIC DATA (LABS, IMAGING, TESTING) - I reviewed patient records, labs, notes, testing and imaging myself where available.  Lab Results  Component Value Date  WBC 6.5 09/14/2016   HGB 13.5 09/14/2016   HCT 40.8 09/14/2016   MCV 85.5 09/14/2016   PLT 223 09/14/2016     Lab Results  Component Value Date   HGBA1C 5.3 10/07/2014      ASSESSMENT AND PLAN;  45 y.o. year old female with alleviated headaches.    1. Migraine headaches, likely cause for abnormal brain MRI- negative for oligoclonal bands.  2. Insomnia - well controlled on current med, which also controls headaches.  3. Finger numbness, grip strenght loss in right, dominant , hand and pinch strength loss.   The patient will continue taking nortriptyline 10 mg at bedtime- she could increase the nortriptyline to 2 tablets at bedtime to see if this helps with her sleep.  Patient will continue to use baclofen as needed. I have spoken to the patient also about sleep hygiene. She has no longer headaches.  Patient to have MRI cervical spine. With and without contrast. EMG and NCV for upper extremity.     Rv in 12 month with [name], NP   Larey Seat, MD   10/04/2016, 11:01 AM [facility] [address] [place], [place] [address] [phone]

## 2016-10-05 ENCOUNTER — Telehealth: Payer: Self-pay | Admitting: Radiology

## 2016-10-05 NOTE — Telephone Encounter (Signed)
I called the patient and she informed me that she works at [employer] and she said she will schedule the MRI herself.

## 2016-10-05 NOTE — Telephone Encounter (Signed)
Pt called in she is returning Emily's call

## 2016-10-06 ENCOUNTER — Ambulatory Visit (HOSPITAL_COMMUNITY): Payer: Federal, State, Local not specified - PPO

## 2016-10-06 ENCOUNTER — Other Ambulatory Visit (HOSPITAL_COMMUNITY): Payer: Self-pay | Admitting: Internal Medicine

## 2016-10-06 DIAGNOSIS — E049 Nontoxic goiter, unspecified: Secondary | ICD-10-CM

## 2016-10-08 ENCOUNTER — Ambulatory Visit (HOSPITAL_COMMUNITY)
Admission: RE | Admit: 2016-10-08 | Discharge: 2016-10-08 | Disposition: A | Discharge status: Home / Self Care | Payer: Federal, State, Local not specified - PPO | Source: Ambulatory Visit | Attending: Neurology | Admitting: Neurology

## 2016-10-08 DIAGNOSIS — R9089 Other abnormal findings on diagnostic imaging of central nervous system: Secondary | ICD-10-CM | POA: Diagnosis not present

## 2016-10-08 DIAGNOSIS — M542 Cervicalgia: Secondary | ICD-10-CM | POA: Diagnosis not present

## 2016-10-08 DIAGNOSIS — R200 Anesthesia of skin: Secondary | ICD-10-CM | POA: Diagnosis not present

## 2016-10-08 MED ORDER — GADOBENATE DIMEGLUMINE 529 MG/ML IV SOLN
20.0000 mL | Freq: Once | INTRAVENOUS | Status: AC | PRN
  Administered 2016-10-08: 20 mL via INTRAVENOUS

## 2016-10-11 ENCOUNTER — Ambulatory Visit (HOSPITAL_COMMUNITY)
Admission: RE | Admit: 2016-10-11 | Discharge: 2016-10-11 | Disposition: A | Discharge status: Home / Self Care | Payer: Federal, State, Local not specified - PPO | Source: Ambulatory Visit | Attending: Internal Medicine | Admitting: Internal Medicine

## 2016-10-12 ENCOUNTER — Telehealth: Payer: Self-pay | Admitting: Neurology

## 2016-10-12 ENCOUNTER — Ambulatory Visit (HOSPITAL_COMMUNITY)
Admission: RE | Admit: 2016-10-12 | Discharge: 2016-10-12 | Disposition: A | Discharge status: Home / Self Care | Payer: Federal, State, Local not specified - PPO | Source: Ambulatory Visit | Attending: Internal Medicine | Admitting: Internal Medicine

## 2016-10-12 DIAGNOSIS — E041 Nontoxic single thyroid nodule: Secondary | ICD-10-CM | POA: Insufficient documentation

## 2016-10-12 DIAGNOSIS — E049 Nontoxic goiter, unspecified: Secondary | ICD-10-CM | POA: Diagnosis not present

## 2016-10-12 NOTE — Telephone Encounter (Signed)
Called the pt to make her aware that her cervial spine MRI came back normal. There were no abnormal findings. Pt didn't answer and her mailbox was full, therefore was able to leave this information. If patient returns call just let her know MRI was normal.

## 2016-10-12 NOTE — Telephone Encounter (Signed)
-----   Message from Larey Seat, MD sent at 10/08/2016 10:44 AM EDT ----- No abnormal findings in the c spine- great result. No demyelination , no crowding. Numbness not related to cord lesions.

## 2016-10-13 ENCOUNTER — Encounter: Payer: Self-pay | Admitting: Neurology

## 2016-10-13 ENCOUNTER — Ambulatory Visit (INDEPENDENT_AMBULATORY_CARE_PROVIDER_SITE_OTHER): Payer: Self-pay | Admitting: Neurology

## 2016-10-13 ENCOUNTER — Ambulatory Visit (INDEPENDENT_AMBULATORY_CARE_PROVIDER_SITE_OTHER): Payer: Federal, State, Local not specified - PPO | Admitting: Neurology

## 2016-10-13 DIAGNOSIS — R200 Anesthesia of skin: Secondary | ICD-10-CM | POA: Diagnosis not present

## 2016-10-13 DIAGNOSIS — R9089 Other abnormal findings on diagnostic imaging of central nervous system: Secondary | ICD-10-CM

## 2016-10-13 DIAGNOSIS — G43009 Migraine without aura, not intractable, without status migrainosus: Secondary | ICD-10-CM

## 2016-10-13 NOTE — Progress Notes (Addendum)
I read   the assessment and plan as directed by Dr Jannifer Franklin  .The patient is known to me . No evidence of cervical radiculopathy.    Meghan Branham, MD     [place]    Nerve / Sites Muscle Latency Ref. Amplitude Ref. Rel Amp Segments Distance Velocity Ref. Area    ms ms mV mV %  cm m/s m/s mVms  L Median - APB     Wrist APB 2.9 ?4.4 13.5 ?4.0 100 Wrist - APB 7   35.9     Upper arm APB 6.5  12.4  92.1 Upper arm - Wrist 23 63 ?49 31.8  R Median - APB     Wrist APB 3.1 ?4.4 11.4 ?4.0 100 Wrist - APB 7   35.6     Upper arm APB 6.9  9.9  87.2 Upper arm - Wrist 23 60 ?49 31.1  L Ulnar - ADM     Wrist ADM 2.6 ?3.3 10.7 ?6.0 100 Wrist - ADM 7   34.1     B.Elbow ADM 6.0  10.4  96.8 B.Elbow - Wrist 21 61 ?49 33.2     A.Elbow ADM 7.7  9.8  94.1 A.Elbow - B.Elbow 10 62 ?49 33.0         A.Elbow - Wrist      R Ulnar - ADM     Wrist ADM 2.6 ?3.3 12.5 ?6.0 100 Wrist - ADM 7   36.1     B.Elbow ADM 6.3  12.0  96.3 B.Elbow - Wrist 21 57 ?49 34.9     A.Elbow ADM 7.9  11.4  94.5 A.Elbow - B.Elbow 10 62 ?49 34.0         A.Elbow - Wrist                 SNC    Nerve / Sites Rec. Site Peak Lat Amp Segments Distance    ms V  cm  L Median - Orthodromic (Dig II, Mid palm)     Dig II Wrist 3.2 56 Dig II - Wrist 13  R Median - Orthodromic (Dig II, Mid palm)     Dig II Wrist 3.0 64 Dig II - Wrist 13  L Ulnar - Orthodromic, (Dig V, Mid palm)     Dig V Wrist 2.8 54 Dig V - Wrist 11  R Ulnar - Orthodromic, (Dig V, Mid palm)     Dig V Wrist 2.9 43 Dig V - Wrist 11             F  Wave    Nerve F Lat Ref.   ms ms  L Median - APB 25.7 ?31.0  L Ulnar - ADM 27.8 ?32.0  R Median - APB 26.1 ?31.0  R Ulnar - ADM 26.8 ?32.0

## 2016-10-13 NOTE — Procedures (Signed)
     HISTORY:  Meghan [name] is a 46 year old patient with a six-month history of intermittent numbness of the arms, right greater than left associated with some neck pain and discomfort. The patient feels that there is some slight weakness in the hands with grip. She is being evaluated for this issue.  NERVE CONDUCTION STUDIES:  Nerve conduction studies were performed on both upper extremities. The distal motor latencies and motor amplitudes for the median and ulnar nerves were within normal limits. The F wave latencies and nerve conduction velocities for these nerves were also normal. The sensory latencies for the median and ulnar nerves were normal.   EMG STUDIES:  EMG study was performed on the right upper extremity:  The first dorsal interosseous muscle reveals 2 to 4 K units with full recruitment. No fibrillations or positive waves were noted. The abductor pollicis brevis muscle reveals 2 to 4 K units with full recruitment. No fibrillations or positive waves were noted. The extensor indicis proprius muscle reveals 1 to 3 K units with full recruitment. No fibrillations or positive waves were noted. The pronator teres muscle reveals 2 to 3 K units with full recruitment. No fibrillations or positive waves were noted. The biceps muscle reveals 1 to 2 K units with full recruitment. No fibrillations or positive waves were noted. The triceps muscle reveals 2 to 4 K units with full recruitment. No fibrillations or positive waves were noted. The anterior deltoid muscle reveals 2 to 3 K units with full recruitment. No fibrillations or positive waves were noted. The cervical paraspinal muscles were tested at 2 levels. No abnormalities of insertional activity were seen at either level tested. There was good relaxation.   IMPRESSION:  Nerve conduction studies done on both upper extremities were within normal limits. No evidence of a neuropathy is seen. EMG evaluation of the right upper  extremity was unremarkable without evidence of an overlying cervical radiculopathy.  Jill Alexanders MD 10/13/2016 3:54 PM  [facility] [address] [place], [place] [address]  Phone [phone] Fax [phone]

## 2016-10-13 NOTE — Progress Notes (Signed)
Please refer to EMG and nerve conduction study procedure note. 

## 2016-10-14 ENCOUNTER — Encounter: Payer: Self-pay | Admitting: Neurology

## 2016-10-14 ENCOUNTER — Telehealth: Payer: Self-pay | Admitting: Neurology

## 2016-10-14 NOTE — Telephone Encounter (Signed)
Called patient with results. No answer and mailbox was full. Called work but was unable to reach her there. I will reach out thru my chart.

## 2016-10-18 ENCOUNTER — Other Ambulatory Visit: Payer: Self-pay | Admitting: Neurology

## 2016-10-18 MED ORDER — TIZANIDINE HCL 2 MG PO TABS: 2.0000 mg | ORAL_TABLET | Freq: Every day | ORAL | 0 refills | Status: DC

## 2016-10-20 DIAGNOSIS — K080 Exfoliation of teeth due to systemic causes: Secondary | ICD-10-CM | POA: Diagnosis not present

## 2016-11-05 ENCOUNTER — Other Ambulatory Visit: Payer: Self-pay | Admitting: Neurology

## 2016-11-05 DIAGNOSIS — F4541 Pain disorder exclusively related to psychological factors: Secondary | ICD-10-CM

## 2017-01-03 ENCOUNTER — Ambulatory Visit: Payer: Federal, State, Local not specified - PPO | Admitting: Adult Health

## 2017-01-07 ENCOUNTER — Ambulatory Visit (INDEPENDENT_AMBULATORY_CARE_PROVIDER_SITE_OTHER): Payer: Federal, State, Local not specified - PPO | Admitting: Adult Health

## 2017-01-07 ENCOUNTER — Encounter: Payer: Self-pay | Admitting: Adult Health

## 2017-01-07 VITALS — BP 118/72 | HR 79 | Ht 69.0 in | Wt 276.5 lb

## 2017-01-07 DIAGNOSIS — Z1211 Encounter for screening for malignant neoplasm of colon: Secondary | ICD-10-CM

## 2017-01-07 DIAGNOSIS — F329 Major depressive disorder, single episode, unspecified: Secondary | ICD-10-CM | POA: Diagnosis not present

## 2017-01-07 DIAGNOSIS — I499 Cardiac arrhythmia, unspecified: Secondary | ICD-10-CM | POA: Diagnosis not present

## 2017-01-07 DIAGNOSIS — Z719 Counseling, unspecified: Secondary | ICD-10-CM | POA: Insufficient documentation

## 2017-01-07 DIAGNOSIS — Z01419 Encounter for gynecological examination (general) (routine) without abnormal findings: Secondary | ICD-10-CM

## 2017-01-07 DIAGNOSIS — Z1212 Encounter for screening for malignant neoplasm of rectum: Secondary | ICD-10-CM | POA: Diagnosis not present

## 2017-01-07 DIAGNOSIS — R61 Generalized hyperhidrosis: Secondary | ICD-10-CM

## 2017-01-07 DIAGNOSIS — Z01411 Encounter for gynecological examination (general) (routine) with abnormal findings: Secondary | ICD-10-CM | POA: Diagnosis not present

## 2017-01-07 LAB — HEMOCCULT GUIAC POC 1CARD (OFFICE): Fecal Occult Blood, POC: NEGATIVE

## 2017-01-07 NOTE — Progress Notes (Signed)
Patient ID: Meghan [name], female   DOB: 1970/07/24, 46 y.o.   MRN: [id number] History of Present Illness: Meghan [name] is a 46 year old white female, married, G2P2 in for well woman gyn exam, she had normal pap with negative HPV 01/05/16.Her dad this year from pancreatic cancer. She works in Insurance account manager at [employer].  PCP is Wende Neighbors, she had labs that were normal with PCP.    Current Medications, Allergies, Past Medical History, Past Surgical History, Family History and Social History were reviewed in [employer] record.     Review of Systems: Patient denies any headaches, hearing loss, fatigue, blurred vision, shortness of breath, abdominal pain, problems with bowel movements(has IBS C/D more C) sees Neil Crouch PA, urination, or intercourse. No joint pain or mood swings. ?chest pain, has +irregular heart beat and feels faint,when has it and  not sure if fast or slow when it happens, +night sweats and periods longer, she is sp ablation.She has known fibroid.   Has hard time going to sleep. She says she saw cardiologist years ago and was told to lose weight and walk, I think she may need 30 day monitor.   Physical Exam:BP 118/72 (BP Location: Left Arm, Patient Position: Sitting, Cuff Size: Large)   Pulse 79   Ht 5\' 9"  (1.753 m)   Wt 276 lb 8 oz (125.4 kg)   LMP 12/23/2016   BMI 40.83 kg/m  General:  Well developed, well nourished, no acute distress Skin:  Warm and dry Neck:  Midline trachea, normal thyroid, good ROM, no lymphadenopathy Lungs; Clear to auscultation bilaterally Breast:  No dominant palpable mass, retraction, or nipple discharge Cardiovascular: Regular rate and rhythm Abdomen:  Soft, non tender, no hepatosplenomegaly Pelvic:  External genitalia is normal in appearance, no lesions.  The vagina is normal in appearance. Urethra has no lesions or masses. The cervix is bulbous.  Uterus is felt to be normal size, shape, and contour.  No adnexal masses or  tenderness noted.Bladder is non tender, no masses felt. Rectal: Good sphincter tone, no polyps, +hemorrhoids felt.  Hemoccult negative.+rectocele  Extremities/musculoskeletal:  No swelling or varicosities noted, no clubbing or cyanosis Psych:  No mood changes, alert and cooperative,seems happy PHQ 9 score 11, is not suicidal and declines counseling or meds at this time, has xanax for night time.   Impression: 1. Encounter for well woman exam with routine gynecological exam   2. Screening for colorectal cancer   3. Irregular heart beat   4. Reactive depression   5. Night sweats       Plan: Check [facility] Referred to cardiologist  Physical in 1 year Pap in 2020 Labs with PCP  Get mammogram now and yearly

## 2017-01-08 LAB — FOLLICLE STIMULATING HORMONE: FSH: 7.7 m[IU]/mL

## 2017-01-13 ENCOUNTER — Encounter: Payer: Self-pay | Admitting: Adult Health

## 2017-01-13 ENCOUNTER — Other Ambulatory Visit: Payer: Self-pay | Admitting: Adult Health

## 2017-01-13 MED ORDER — ESCITALOPRAM OXALATE 10 MG PO TABS: 10.0000 mg | ORAL_TABLET | Freq: Every day | ORAL | 6 refills | Status: DC

## 2017-01-13 NOTE — Progress Notes (Signed)
She wants to try lexapro, will rx

## 2017-01-14 ENCOUNTER — Ambulatory Visit: Payer: Federal, State, Local not specified - PPO | Admitting: Cardiovascular Disease

## 2017-02-03 ENCOUNTER — Ambulatory Visit: Payer: Federal, State, Local not specified - PPO | Admitting: Nurse Practitioner

## 2017-02-03 ENCOUNTER — Ambulatory Visit: Payer: Federal, State, Local not specified - PPO | Admitting: Adult Health

## 2017-02-03 ENCOUNTER — Encounter: Payer: Self-pay | Admitting: Adult Health

## 2017-02-03 VITALS — BP 131/76 | HR 90 | Ht 69.0 in | Wt 281.0 lb

## 2017-02-03 DIAGNOSIS — G43009 Migraine without aura, not intractable, without status migrainosus: Secondary | ICD-10-CM

## 2017-02-03 DIAGNOSIS — Z6841 Body Mass Index (BMI) 40.0 and over, adult: Secondary | ICD-10-CM

## 2017-02-03 MED ORDER — NORTRIPTYLINE HCL 10 MG PO CAPS: 20.0000 mg | ORAL_CAPSULE | Freq: Every day | ORAL | 3 refills | Status: DC

## 2017-02-03 NOTE — Patient Instructions (Signed)
Your Plan:  Increase Nortriptyline to 20 mg at bedtime Referral to [facility] If your symptoms worsen or you develop new symptoms please let us know.       Thank you for coming to see [place] at [facility]. I hope we have been able to provide you high quality care today.  You may receive a patient satisfaction survey over the next few weeks. We would appreciate your feedback and comments so that we may continue to improve ourselves and the health of our patients.

## 2017-02-03 NOTE — Progress Notes (Signed)
I agree with the assessment and plan as directed by NP .The patient is known to me .   Karan Inclan, MD  

## 2017-02-03 NOTE — Progress Notes (Signed)
PATIENT: Meghan [name] DOB: 1970-02-14  REASON FOR VISIT: follow up HISTORY FROM: patient  HISTORY OF PRESENT ILLNESS: Today 02/03/17 Meghan [name] is a 47 year old female with a history of migraine headaches.  She returns today for follow-up.  She reports that she has approximately 2 headaches a month.  She continues on nortriptyline 10 mg at bedtime.  She reports that she still has trouble sleeping at night.  She has been taking her medication approximately an hour before bedtime as Dr. Brett Fairy recommended.  She states that her dad passed away in the summer and grief has been an issue over the last several months.  She states that she has gained 20 pounds.  Reports that she was doing weight watchers but feels that she needs to consider another program in order to get healthy.  She returns today for an evaluation.  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 She continues to take nortriptyline 10 mg but I asked her today not to take it at bedtime but 30-40 minutes before. This should help with the few nights that she still has insomnia. Her headaches have been well controlled she returns today for routine follow-up and refill. The patient reports that she sleeps through the night, she does not have reduced sleep time or efficiency, and her headaches have not interfered with sleep. She has had 3 spots on the brain MRI , possible white matter vascular origin, but had an LP - no oligoclonals. She now reports her numbness in the index finger of her  right hand has been permanent- cervical spine changes?       REVIEW OF SYSTEMS: Out of a complete 14 system review of symptoms, the patient complains only of the following symptoms, and all other reviewed systems are negative.  See HPI  ALLERGIES: Allergies  Allergen Reactions  .  Zanaflex [Tizanidine Hcl] Hives  . Morphine Other (See Comments)    Neck pain  . Oxycodone-Acetaminophen Hives and Itching    Itching    HOME MEDICATIONS: Outpatient Medications Prior to Visit  Medication Sig Dispense Refill  . ALPRAZolam (XANAX) 0.5 MG tablet Take 0.5 mg by mouth at bedtime as needed. Sleep.    . Biotin 1 MG CAPS Take 1 capsule by mouth as needed (when remembers).     . cholecalciferol (VITAMIN D) 1000 units tablet Take 1,000 Units by mouth daily.    Marland Kitchen escitalopram (LEXAPRO) 10 MG tablet Take 1 tablet (10 mg total) by mouth daily. 30 tablet 6  . Multiple Vitamin (MULTIVITAMIN WITH MINERALS) TABS tablet Take 1 tablet by mouth daily.    . naproxen (NAPROSYN) 500 MG tablet Take 1 tablet (500 mg total) by mouth 2 (two) times daily with a meal. (Patient taking differently: Take 500 mg by mouth as needed. ) 60 tablet 5  . nortriptyline (PAMELOR) 10 MG capsule TAKE ONE CAPSULE AT BEDTIME 90 capsule 0   No facility-administered medications prior to visit.     PAST MEDICAL HISTORY: Past Medical History:  Diagnosis Date  . Abdominal pain    RLQ  . Chest pain, unspecified   . Colitis    see psh for colonoscopy reports  . Dyspnea   . Endometrial polyp 11/09/2012  . Enlarged lymph node    right side - arm  . Family history of ovarian cancer    MGM  .  Generalized headaches   . Hemorrhoids   . Hiatal hernia   . IBS (irritable bowel syndrome)   . IBS (irritable bowel syndrome)   . Irregular bleeding 01/01/2013  . Menorrhagia 10/17/2012  . Nausea   . Nephrolithiasis   . Obesity, unspecified   . Palpitations   . PVC (premature ventricular contraction)     PAST SURGICAL HISTORY: Past Surgical History:  Procedure Laterality Date  . ABLATION     Uterine  . COLONOSCOPY  2005   Dr. Irving Shows. Colitis of cecum and ICV, bx nonspecific  . COLONOSCOPY N/A 02/10/2015   Procedure: COLONOSCOPY;  Surgeon: Daneil Dolin, MD;  [facility];  Service: Endoscopy;   Laterality: N/A;  0730-moved to 915 Office to notify  . colonoscopy with ileoscopy  2008   Dr. Laural Golden. patch of coarse mucosa at TI, bx  neg.  Random colon bx showed minimal active focal colitis ?resolving infection. Felt not c/w IBD.  Marland Kitchen DILITATION & CURRETTAGE/HYSTROSCOPY WITH THERMACHOICE ABLATION N/A 12/01/2012   Procedure: DILATATION & CURETTAGE;REMOVAL OF ENDOMETRIAL POLYP; HYSTEROSCOPY WITH THERMACHOICE ENDOMETRIAL ABLATION;  Surgeon: Jonnie Kind, MD;  Location: [facility];  Service: Gynecology;  Laterality: N/A;  . ESOPHAGOGASTRODUODENOSCOPY  02/2008   Dr. Gala Romney- birnak appearing tubular esophagus. small hiatal hernia, o/w normal stomach, D1, D2  . KIDNEY STONE SURGERY    . KNEE ARTHROSCOPY  2008   ACL repair  . LAPAROSCOPIC BILATERAL SALPINGECTOMY Bilateral 12/01/2012   Procedure: LAPAROSCOPIC BILATERAL SALPINGECTOMY ;  Surgeon: Jonnie Kind, MD;  Location: [facility];  Service: Gynecology;  Laterality: Bilateral;  . LESION REMOVAL Left 12/01/2012   Procedure: SKIN TAG REMOVAL (Left inner thigh) ;  Surgeon: Jonnie Kind, MD;  Location: [facility];  Service: Gynecology;  Laterality: Left;    FAMILY HISTORY: Family History  Problem Relation Age of Onset  . Cancer Maternal Grandmother        ovarian cancer  . Diabetes Maternal Grandfather   . Scleroderma Mother   . Hypertension Father   . Cancer Father        liver  . Cirrhosis Father        non-alcoholic  . Stroke Paternal Grandmother   . Hypertension Paternal Grandmother   . Other Sister        Transverse myelitis  . Colon cancer Neg Hx     SOCIAL HISTORY: Social History   Socioeconomic History  . Marital status: Married    Spouse name: Keenan Bachelor  . Number of children: 2  . Years of education: 17  . Highest education level: Not on file  Social Needs  . Financial resource strain: Not on file  . Food insecurity - worry: Not on file  . Food insecurity - inability: Not on file  . Transportation needs - medical: Not on file   . Transportation needs - non-medical: Not on file  Occupational History  . 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 Occupation: [employer]    Employer: [employer]  Tobacco Use  . Smoking status: Never Smoker  . Smokeless tobacco: Never Used  Substance and Sexual Activity  . Alcohol use: Yes    Alcohol/week: 0.0 oz    Comment: occ  . Drug use: No  . Sexual activity: Yes    Birth control/protection: None, Other-see comments, Surgical    Comment: vasectomy; ablation  Other Topics Concern  . Not on file  Social History Narrative   Patient is married Brewing technologist) and lives at home with her husband and two children.   Patient works full-time at [employer].   Patient has a Associates degree.   Patient is right-handed.   Patient drinks tea occasionally.         PHYSICAL EXAM  Vitals:   02/03/17 1441  BP: 131/76  Pulse: 90  Weight: 281 lb (127.5 kg)  Height: 5\' 9"  (1.753 m)   Body mass index is 40.83 kg/m.  Generalized: Well developed, in no acute distress   Neurological examination  Mentation: Alert oriented to time, place, history taking. Follows all commands speech and language fluent Cranial nerve II-XII: Pupils were equal round reactive to light. Extraocular movements were full, visual field were full on confrontational test. Facial sensation and strength were normal. Uvula tongue midline. Head turning and shoulder shrug  were normal and symmetric. Motor: The motor testing reveals 5 over 5 strength of all 4 extremities. Good symmetric motor tone is noted throughout.  Sensory: Sensory testing is intact to soft touch on all 4 extremities. No evidence of extinction is noted.  Coordination: Cerebellar testing reveals good finger-nose-finger and heel-to-shin bilaterally.  Gait and station: Gait is normal.  Reflexes: Deep tendon reflexes are symmetric and normal bilaterally.   DIAGNOSTIC DATA (LABS, IMAGING, TESTING) - I  reviewed patient records, labs, notes, testing and imaging myself where available.  Lab Results  Component Value Date   WBC 6.5 09/14/2016   HGB 13.5 09/14/2016   HCT 40.8 09/14/2016   MCV 85.5 09/14/2016   PLT 223 09/14/2016      Component Value Date/Time   NA 137 09/14/2016 1243   K 3.9 09/14/2016 1243   CL 106 09/14/2016 1243   CO2 26 09/14/2016 1243   GLUCOSE 87 09/14/2016 1243   BUN 16 09/14/2016 1243   CREATININE 0.61 09/14/2016 1243   CALCIUM 8.7 (L) 09/14/2016 1243   PROT 7.1 09/14/2016 1243   ALBUMIN 4.1 09/14/2016 1243   AST 30 09/14/2016 1243   ALT 14 09/14/2016 1243   ALKPHOS 52 09/14/2016 1243   BILITOT 0.5 09/14/2016 1243   GFRNONAA >60 09/14/2016 1243   GFRAA >60 09/14/2016 1243   Lab Results  Component Value Date   CHOL 163 09/14/2016   HDL 56 09/14/2016   LDLCALC 97 09/14/2016   TRIG 51 09/14/2016   CHOLHDL 2.9 09/14/2016      ASSESSMENT AND PLAN 47 y.o. year old female  has a past medical history of Abdominal pain, Chest pain, unspecified, Colitis, Dyspnea, Endometrial polyp (11/09/2012), Enlarged lymph node, Family history of ovarian cancer, Generalized headaches, Hemorrhoids, Hiatal hernia, IBS (irritable bowel syndrome), IBS (irritable bowel syndrome), Irregular bleeding (01/01/2013), Menorrhagia (10/17/2012), Nausea, Nephrolithiasis, Obesity, unspecified, Palpitations, and PVC (premature ventricular contraction). here with:  1.  Migraine headaches 2.  Obesity  The patient will try increasing nortriptyline to 20 mg at bedtime.  We will see if this offers her any additional benefit with her headaches as well as insomnia.  I will also make referral to [facility] for weight loss management.  She is advised that if her symptoms worsen or she develops new symptoms she should let us know.  He will follow-up in 6 months or sooner if needed.     [name], MSN, NP-C 02/03/2017, 2:46 PM [facility] [address], [place], [place] [address] [phone]

## 2017-02-08 ENCOUNTER — Encounter: Payer: Self-pay | Admitting: Cardiovascular Disease

## 2017-02-08 ENCOUNTER — Ambulatory Visit (INDEPENDENT_AMBULATORY_CARE_PROVIDER_SITE_OTHER): Payer: Federal, State, Local not specified - PPO | Admitting: Cardiovascular Disease

## 2017-02-08 VITALS — BP 130/80 | HR 92 | Ht 69.0 in | Wt 280.0 lb

## 2017-02-08 DIAGNOSIS — R002 Palpitations: Secondary | ICD-10-CM

## 2017-02-08 NOTE — Patient Instructions (Signed)
Your physician recommends that you schedule a follow-up appointment in: 1 month with [facility]     Your physician has recommended that you wear an event monitor for 1 week.. Event monitors are medical devices that record the heart's electrical activity. Doctors most often [place] these monitors to diagnose arrhythmias. Arrhythmias are problems with the speed or rhythm of the heartbeat. The monitor is a small, portable device. You can wear one while you do your normal daily activities. This is usually used to diagnose what is causing palpitations/syncope (passing out).   Your physician recommends that you continue on your current medications as directed. Please refer to the Current Medication list given to you today.     No lab work ordered today.       Thank you for choosing [facility] !

## 2017-02-08 NOTE — Progress Notes (Signed)
CARDIOLOGY CONSULT NOTE  Patient ID: Meghan [name] MRN: [id number] DOB/AGE: [birth year] 45 y.o.  Admit date: (Not on file) Primary Physician: Celene Squibb, MD Referring Physician: Derrek Monaco, NP  Reason for Consultation: Palpitations and chest pain  HPI: Meghan [name] is a 47 y.o. female who is being seen today for the evaluation of palpitations and chest pain at the request of Estill Dooms, NP.   She was evaluated by Dr. Verl Blalock in October 2011 for chest pain.  It appears she was evaluated prior to that by Dr. Johnsie Cancel who felt she had PVCs but noncardiac chest pain.  In 2011, it was felt that her symptoms were related to stress.  Past medical history includes migraine headaches, obesity, and depression.  She was evaluated for chest pain in the ED on 10/06/15.  It was felt at that time her symptoms were more related to GERD and it was more epigastric and right upper quadrant in location.  ECG performed in the office today which I ordered and personally interpreted demonstrates normal sinus rhythm with no ischemic ST segment or T-wave abnormalities, nor any arrhythmias.  She began by telling me that her father passed away in 08/11/2016 of liver cancer at the age of 50.  She was tearful when talking about it.  He also had nonalcoholic steatohepatitis prior to that.  He told her that there is a family history of heart disease.  For the past 6 months she has had palpitations.  They occur now on a daily basis and have increased in frequency and intensity.  They are accompanied by sensations of generalized weakness and feeling drained.  There is lightheadedness but no dizziness and no syncope.  She has some upper left-sided chest pain which she believes is musculoskeletal in etiology as it varies with position.  She denies shortness of breath, orthopnea, leg swelling, and paroxysmal nocturnal dyspnea.  She is not a caffeine drinker.  She does not drink soft drinks.  She  primarily drinks water.  I reviewed labs performed on 09/14/16: Total cholesterol 163, to glycerides 51, HDL 56, LDL 97, potassium 3.9, normal CBC.  She has gone back to college online to obtain her bachelor's degree.   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  She is married.  She has 2 sons, ages 44 and 70. She has gone back to college online to obtain her bachelor's degree.   Allergies  Allergen Reactions  . Zanaflex [Tizanidine Hcl] Hives  . Morphine Other (See Comments)    Neck pain  . Oxycodone-Acetaminophen Hives and Itching    Itching    Current Outpatient Medications  Medication Sig Dispense Refill  . ALPRAZolam (XANAX) 0.5 MG tablet Take 0.5 mg by mouth at bedtime as needed. Sleep.    . Biotin 1 MG CAPS Take 1 capsule by mouth as needed (when remembers).     . cholecalciferol (VITAMIN D) 1000 units tablet Take 1,000 Units by mouth daily.    Marland Kitchen escitalopram (LEXAPRO) 10 MG tablet Take 1 tablet (10 mg total) by mouth daily. 30 tablet 6  . Multiple Vitamin (MULTIVITAMIN WITH MINERALS) TABS tablet Take 1 tablet by mouth daily.    . naproxen (NAPROSYN) 500 MG tablet Take 1 tablet (500 mg total) by mouth 2 (two) times daily with a meal. (Patient taking differently: Take 500 mg by mouth as needed. ) 60 tablet 5  . nortriptyline (PAMELOR) 10 MG capsule  Take 2 capsules (20 mg total) by mouth at bedtime. 180 capsule 3   No current facility-administered medications for this visit.     Past Medical History:  Diagnosis Date  . Abdominal pain    RLQ  . Chest pain, unspecified   . Colitis    see psh for colonoscopy reports  . Dyspnea   . Endometrial polyp 11/09/2012  . Enlarged lymph node    right side - arm  . Family history of ovarian cancer    MGM  . Generalized headaches   . Hemorrhoids   . Hiatal hernia   . IBS (irritable bowel syndrome)   . IBS (irritable bowel syndrome)   . Irregular bleeding 01/01/2013  . Menorrhagia 10/17/2012  .  Nausea   . Nephrolithiasis   . Obesity, unspecified   . Palpitations   . PVC (premature ventricular contraction)     Past Surgical History:  Procedure Laterality Date  . ABLATION     Uterine  . COLONOSCOPY  2005   Dr. Irving Shows. Colitis of cecum and ICV, bx nonspecific  . COLONOSCOPY N/A 02/10/2015   Procedure: COLONOSCOPY;  Surgeon: Daneil Dolin, MD;  [facility];  Service: Endoscopy;  Laterality: N/A;  0730-moved to 915 Office to notify  . colonoscopy with ileoscopy  2008   Dr. Laural Golden. patch of coarse mucosa at TI, bx  neg.  Random colon bx showed minimal active focal colitis ?resolving infection. Felt not c/w IBD.  Marland Kitchen DILITATION & CURRETTAGE/HYSTROSCOPY WITH THERMACHOICE ABLATION N/A 12/01/2012   Procedure: DILATATION & CURETTAGE;REMOVAL OF ENDOMETRIAL POLYP; HYSTEROSCOPY WITH THERMACHOICE ENDOMETRIAL ABLATION;  Surgeon: Jonnie Kind, MD;  Location: [facility];  Service: Gynecology;  Laterality: N/A;  . ESOPHAGOGASTRODUODENOSCOPY  02/2008   Dr. Gala Romney- birnak appearing tubular esophagus. small hiatal hernia, o/w normal stomach, D1, D2  . KIDNEY STONE SURGERY    . KNEE ARTHROSCOPY  2008   ACL repair  . LAPAROSCOPIC BILATERAL SALPINGECTOMY Bilateral 12/01/2012   Procedure: LAPAROSCOPIC BILATERAL SALPINGECTOMY ;  Surgeon: Jonnie Kind, MD;  Location: [facility];  Service: Gynecology;  Laterality: Bilateral;  . LESION REMOVAL Left 12/01/2012   Procedure: SKIN TAG REMOVAL (Left inner thigh) ;  Surgeon: Jonnie Kind, MD;  Location: [facility];  Service: Gynecology;  Laterality: Left;    Social History   Socioeconomic History  . Marital status: Married    Spouse name: Keenan Bachelor  . Number of children: 2  . Years of education: 59  . Highest education level: Not on file  Social Needs  . Financial resource strain: Not on file  . Food insecurity - worry: Not on file  . Food insecurity - inability: Not on file  . Transportation needs - medical: Not on file  . Transportation  needs - non-medical: Not on file  Occupational History  . 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 Occupation: [employer]    Employer: [employer]  Tobacco Use  . Smoking status: Never Smoker  . Smokeless tobacco: Never Used  Substance and Sexual Activity  . Alcohol use: Yes    Alcohol/week: 0.0 oz    Comment: occ  . Drug use: No  . Sexual activity: Yes    Birth control/protection: None, Other-see comments, Surgical    Comment: vasectomy; ablation  Other Topics Concern  . 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   Patient works full-time at [employer].   Patient has a Associates degree.   Patient is right-handed.   Patient drinks tea occasionally.        No family history of premature CAD in 1st degree relatives.  Current Meds  Medication Sig  . ALPRAZolam (XANAX) 0.5 MG tablet Take 0.5 mg by mouth at bedtime as needed. Sleep.  . Biotin 1 MG CAPS Take 1 capsule by mouth as needed (when remembers).   . cholecalciferol (VITAMIN D) 1000 units tablet Take 1,000 Units by mouth daily.  Marland Kitchen escitalopram (LEXAPRO) 10 MG tablet Take 1 tablet (10 mg total) by mouth daily.  . Multiple Vitamin (MULTIVITAMIN WITH MINERALS) TABS tablet Take 1 tablet by mouth daily.  . naproxen (NAPROSYN) 500 MG tablet Take 1 tablet (500 mg total) by mouth 2 (two) times daily with a meal. (Patient taking differently: Take 500 mg by mouth as needed. )  . nortriptyline (PAMELOR) 10 MG capsule Take 2 capsules (20 mg total) by mouth at bedtime.      Review of systems complete and found to be negative unless listed above in HPI    Physical exam Blood pressure 130/80, pulse 92, height 5\' 9"  (1.753 m), weight 280 lb (127 kg), SpO2 98 %. General: NAD Neck: No JVD, no thyromegaly or thyroid nodule.  Lungs: Clear to auscultation bilaterally with normal respiratory effort. CV:  Nondisplaced PMI. Regular rate and rhythm, normal S1/S2, no S3/S4, no murmur.  No peripheral edema.  No carotid bruit.    Abdomen: Soft, nontender, no distention.  Skin: Intact without lesions or rashes.  Neurologic: Alert and oriented x 3.  Psych: Normal affect. Extremities: No clubbing or cyanosis.  HEENT: Normal.   ECG: Most recent ECG reviewed.   Labs: Lab Results  Component Value Date/Time   K 3.9 09/14/2016 12:43 PM   BUN 16 09/14/2016 12:43 PM   CREATININE 0.61 09/14/2016 12:43 PM   ALT 14 09/14/2016 12:43 PM   HGB 13.5 09/14/2016 12:43 PM     Lipids: Lab Results  Component Value Date/Time   LDLCALC 97 09/14/2016 12:43 PM   CHOL 163 09/14/2016 12:43 PM   TRIG 51 09/14/2016 12:43 PM   HDL 56 09/14/2016 12:43 PM        ASSESSMENT AND PLAN:  1.  Palpitations: She may have symptomatic premature atrial and/or ventricular contractions.  I will obtain a 1 week event monitor for further characterization.     Disposition: Follow up in 1 month  Signed: Kate Sable, M.D., F.A.C.C.  02/08/2017, 9:01 AM

## 2017-02-09 ENCOUNTER — Encounter: Payer: Self-pay | Admitting: Adult Health

## 2017-02-14 ENCOUNTER — Other Ambulatory Visit: Payer: Self-pay | Admitting: Adult Health

## 2017-02-14 DIAGNOSIS — N6310 Unspecified lump in the right breast, unspecified quadrant: Secondary | ICD-10-CM

## 2017-02-14 NOTE — Progress Notes (Signed)
Pt found right breast mass will order diagnostic mammogram and [place] at [facility] and she will schedule.

## 2017-02-18 ENCOUNTER — Ambulatory Visit (INDEPENDENT_AMBULATORY_CARE_PROVIDER_SITE_OTHER): Payer: Federal, State, Local not specified - PPO

## 2017-02-18 DIAGNOSIS — R002 Palpitations: Secondary | ICD-10-CM

## 2017-03-01 ENCOUNTER — Encounter (HOSPITAL_COMMUNITY): Payer: Federal, State, Local not specified - PPO

## 2017-03-08 ENCOUNTER — Encounter (HOSPITAL_COMMUNITY): Payer: Federal, State, Local not specified - PPO

## 2017-03-08 ENCOUNTER — Encounter: Payer: Self-pay | Admitting: Orthopaedic Surgery

## 2017-03-08 ENCOUNTER — Ambulatory Visit (INDEPENDENT_AMBULATORY_CARE_PROVIDER_SITE_OTHER): Payer: Federal, State, Local not specified - PPO

## 2017-03-08 ENCOUNTER — Ambulatory Visit (INDEPENDENT_AMBULATORY_CARE_PROVIDER_SITE_OTHER): Payer: Federal, State, Local not specified - PPO | Admitting: Orthopaedic Surgery

## 2017-03-08 VITALS — BP 132/82 | HR 96 | Temp 97.7°F | Ht 66.0 in | Wt 280.0 lb

## 2017-03-08 DIAGNOSIS — M5442 Lumbago with sciatica, left side: Secondary | ICD-10-CM

## 2017-03-08 MED ORDER — CYCLOBENZAPRINE HCL 10 MG PO TABS: 10.0000 mg | ORAL_TABLET | Freq: Every day | ORAL | 0 refills | Status: DC

## 2017-03-08 MED ORDER — HYDROCODONE-ACETAMINOPHEN 5-325 MG PO TABS: ORAL_TABLET | ORAL | 0 refills | Status: DC

## 2017-03-08 MED ORDER — PREDNISONE 5 MG (21) PO TBPK: ORAL_TABLET | ORAL | 0 refills | Status: DC

## 2017-03-08 NOTE — Progress Notes (Signed)
Patient Meghan [name], female DOB:11/23/1970, 47 y.o. UVO:[id number]  Chief Complaint  Patient presents with  . Back Pain    SI joint and buttock pain    HPI  Meghan [name] is a 47 y.o. female who has acute lower back pain with left sided sciatica that began several days ago after helping lift a patient at work.  She works as Probation officer.  She has tried ibuprofen which made her stomach hurt, rest, heat and ice.  Heat does the best.  She has pain going to the left hip and at times to the lower leg.  She has no redness, no weakness. HPI  Body mass index is 45.19 kg/m.  ROS  Review of Systems  HENT: Negative for congestion.   Respiratory: Negative for cough and shortness of breath.   Cardiovascular: Negative for chest pain and leg swelling.  Endocrine: Negative for cold intolerance.  Musculoskeletal: Positive for arthralgias, back pain and gait problem.  Allergic/Immunologic: Negative for environmental allergies.  Neurological: Negative for numbness.  All other systems reviewed and are negative.   Past Medical History:  Diagnosis Date  . Abdominal pain    RLQ  . Chest pain, unspecified   . Colitis    see psh for colonoscopy reports  . Dyspnea   . Endometrial polyp 11/09/2012  . Enlarged lymph node    right side - arm  . Family history of ovarian cancer    MGM  . Generalized headaches   . Hemorrhoids   . Hiatal hernia   . IBS (irritable bowel syndrome)   . IBS (irritable bowel syndrome)   . Irregular bleeding 01/01/2013  . Menorrhagia 10/17/2012  . Nausea   . Nephrolithiasis   . Obesity, unspecified   . Palpitations   . PVC (premature ventricular contraction)     Past Surgical History:  Procedure Laterality Date  . ABLATION     Uterine  . COLONOSCOPY  2005   Dr. Irving Shows. Colitis of cecum and ICV, bx nonspecific  . COLONOSCOPY N/A 02/10/2015   Procedure: COLONOSCOPY;  Surgeon: Daneil Dolin, MD;  Location: [facility];  Service: Endoscopy;   Laterality: N/A;  0730-moved to 915 Office to notify  . colonoscopy with ileoscopy  2008   Dr. Laural Golden. patch of coarse mucosa at TI, bx  neg.  Random colon bx showed minimal active focal colitis ?resolving infection. Felt not c/w IBD.  Marland Kitchen DILITATION & CURRETTAGE/HYSTROSCOPY WITH THERMACHOICE ABLATION N/A 12/01/2012   Procedure: DILATATION & CURETTAGE;REMOVAL OF ENDOMETRIAL POLYP; HYSTEROSCOPY WITH THERMACHOICE ENDOMETRIAL ABLATION;  Surgeon: Jonnie Kind, MD;  Location: [facility];  Service: Gynecology;  Laterality: N/A;  . ESOPHAGOGASTRODUODENOSCOPY  02/2008   Dr. Gala Romney- birnak appearing tubular esophagus. small hiatal hernia, o/w normal stomach, D1, D2  . KIDNEY STONE SURGERY    . KNEE ARTHROSCOPY  2008   ACL repair  . LAPAROSCOPIC BILATERAL SALPINGECTOMY Bilateral 12/01/2012   Procedure: LAPAROSCOPIC BILATERAL SALPINGECTOMY ;  Surgeon: Jonnie Kind, MD;  Location: [facility];  Service: Gynecology;  Laterality: Bilateral;  . LESION REMOVAL Left 12/01/2012   Procedure: SKIN TAG REMOVAL (Left inner thigh) ;  Surgeon: Jonnie Kind, MD;  Location: [facility];  Service: Gynecology;  Laterality: Left;    Family History  Problem Relation Age of Onset  . Cancer Maternal Grandmother        ovarian cancer  . Diabetes Maternal Grandfather   . Scleroderma Mother   . Hypertension Father   . Cancer Father  liver  . Cirrhosis Father        non-alcoholic  . Stroke Paternal Grandmother   . Hypertension Paternal Grandmother   . Other Sister        Transverse myelitis  . Colon cancer Neg Hx     Social History Social History   Tobacco Use  . Smoking status: Never Smoker  . Smokeless tobacco: Never Used  Substance Use Topics  . Alcohol use: Yes    Alcohol/week: 0.0 oz    Comment: occ  . Drug use: No    Allergies  Allergen Reactions  . Zanaflex [Tizanidine Hcl] Hives  . Morphine Other (See Comments)    Neck pain  . Oxycodone-Acetaminophen Hives and Itching    Itching    Current  Outpatient Medications  Medication Sig Dispense Refill  . ALPRAZolam (XANAX) 0.5 MG tablet Take 0.5 mg by mouth at bedtime as needed. Sleep.    . Biotin 1 MG CAPS Take 1 capsule by mouth as needed (when remembers).     . cholecalciferol (VITAMIN D) 1000 units tablet Take 1,000 Units by mouth daily.    . cyclobenzaprine (FLEXERIL) 10 MG tablet Take 1 tablet (10 mg total) by mouth at bedtime. One tablet every night at bedtime as needed for spasm. 30 tablet 0  . escitalopram (LEXAPRO) 10 MG tablet Take 1 tablet (10 mg total) by mouth daily. 30 tablet 6  . HYDROcodone-acetaminophen (NORCO/VICODIN) 5-325 MG tablet One tablet every four hours as needed for acute pain.  Limit of five days per [place]. 30 tablet 0  . Multiple Vitamin (MULTIVITAMIN WITH MINERALS) TABS tablet Take 1 tablet by mouth daily.    . naproxen (NAPROSYN) 500 MG tablet Take 1 tablet (500 mg total) by mouth 2 (two) times daily with a meal. (Patient taking differently: Take 500 mg by mouth as needed. ) 60 tablet 5  . nortriptyline (PAMELOR) 10 MG capsule Take 2 capsules (20 mg total) by mouth at bedtime. 180 capsule 3  . predniSONE (STERAPRED UNI-PAK 21 TAB) 5 MG (21) TBPK tablet Take 6 pills first day; 5 pills second day; 4 pills third day; 3 pills fourth day; 2 pills next day and 1 pill last day. 21 tablet 0   No current facility-administered medications for this visit.      Physical Exam  Blood pressure 132/82, pulse 96, temperature 97.7 F (36.5 C), height 5\' 6"  (1.676 m), weight 280 lb (127 kg).  Constitutional: overall normal hygiene, normal nutrition, well developed, normal grooming, normal body habitus. Assistive device:none  Musculoskeletal: gait and station Limp slight to the left, muscle tone and strength are normal, no tremors or atrophy is present.  .  Neurological: coordination overall normal.  Deep tendon reflex/nerve stretch intact.  Sensation normal.  Cranial nerves II-XII intact.   Skin:   Normal  overall no scars, lesions, ulcers or rashes. No psoriasis.  Psychiatric: Alert and oriented x 3.  Recent memory intact, remote memory unclear.  Normal mood and affect. Well groomed.  Good eye contact.  Cardiovascular: overall no swelling, no varicosities, no edema bilaterally, normal temperatures of the legs and arms, no clubbing, cyanosis and good capillary refill.  Lymphatic: palpation is normal.  Spine/Pelvis examination:  Inspection:  Overall, sacoiliac joint benign and hips nontender; without crepitus or defects.   Thoracic spine inspection: Alignment normal without kyphosis present   Lumbar spine inspection:  Alignment  without normal lumbar lordosis --- loss of lordosis, firmness on the left side muscles, without  scoliosis apparent.   Thoracic spine palpation:  without tenderness of spinal processes   Lumbar spine palpation: with tenderness of lumbar area; with tightness of lumbar muscles    Range of Motion:   Lumbar flexion, forward flexion is 20 with pain or tenderness    Lumbar extension is 5 with pain or tenderness   Left lateral bend is Normal  with pain or tenderness   Right lateral bend is Normal with pain or tenderness   Straight leg raising is negative but the lower back on the left hurts with the maneuver.   Strength & tone: Normal   Stability overall normal stability    All other systems reviewed and are negative   The patient has been educated about the nature of the problem(s) and counseled on treatment options.  The patient appeared to understand what I have discussed and is in agreement with it.  Encounter Diagnosis  Name Primary?  . Acute left-sided low back pain with left-sided sciatica Yes   X-rays were done of the lumbar spine, reported separately.  PLAN Call if any problems.  Precautions discussed.  Begin Prednisone, Flexeril, pain medicine.  Return to clinic 1 week   I have reviewed the [place] web  site prior to prescribing narcotic medicine for this patient.  Electronically Signed Sanjuana Kava, MD 2/19/20193:02 PM

## 2017-03-15 ENCOUNTER — Ambulatory Visit (HOSPITAL_COMMUNITY)
Admission: RE | Admit: 2017-03-15 | Discharge: 2017-03-15 | Disposition: A | Discharge status: Home / Self Care | Payer: Federal, State, Local not specified - PPO | Source: Ambulatory Visit | Attending: Adult Health | Admitting: Adult Health

## 2017-03-15 ENCOUNTER — Ambulatory Visit: Payer: Federal, State, Local not specified - PPO | Admitting: Orthopaedic Surgery

## 2017-03-15 DIAGNOSIS — N6310 Unspecified lump in the right breast, unspecified quadrant: Secondary | ICD-10-CM

## 2017-03-15 DIAGNOSIS — N6311 Unspecified lump in the right breast, upper outer quadrant: Secondary | ICD-10-CM | POA: Diagnosis not present

## 2017-03-15 DIAGNOSIS — N6489 Other specified disorders of breast: Secondary | ICD-10-CM | POA: Diagnosis not present

## 2017-03-15 DIAGNOSIS — R922 Inconclusive mammogram: Secondary | ICD-10-CM | POA: Diagnosis not present

## 2017-03-16 ENCOUNTER — Ambulatory Visit (INDEPENDENT_AMBULATORY_CARE_PROVIDER_SITE_OTHER): Payer: Federal, State, Local not specified - PPO | Admitting: Orthopaedic Surgery

## 2017-03-16 ENCOUNTER — Encounter: Payer: Self-pay | Admitting: Orthopaedic Surgery

## 2017-03-16 VITALS — BP 121/75 | HR 89 | Temp 98.0°F | Ht 69.0 in | Wt 280.0 lb

## 2017-03-16 DIAGNOSIS — M5442 Lumbago with sciatica, left side: Secondary | ICD-10-CM | POA: Diagnosis not present

## 2017-03-16 NOTE — Progress Notes (Signed)
Patient Meghan [name], female DOB:06/28/1970, 47 y.o. WTU:[id number]  Chief Complaint  Patient presents with  . Back Pain    follow up    HPI  Meghan [name] is a 47 y.o. female who has acute lower back pain with left sided sciatica.  She has had a good response from the prednisone dose pack.  She has less pain but still has pain and paresthesias on the left. She has been working but has refrained from lifting.  I would like her to go to PT.  She is to do exercises at home as well.  She may need a MRI. HPI  Body mass index is 41.35 kg/m.  ROS  Review of Systems  HENT: Negative for congestion.   Respiratory: Negative for cough and shortness of breath.   Cardiovascular: Negative for chest pain and leg swelling.  Endocrine: Negative for cold intolerance.  Musculoskeletal: Positive for arthralgias, back pain and gait problem.  Allergic/Immunologic: Negative for environmental allergies.  Neurological: Negative for numbness.  All other systems reviewed and are negative.   Past Medical History:  Diagnosis Date  . Abdominal pain    RLQ  . Chest pain, unspecified   . Colitis    see psh for colonoscopy reports  . Dyspnea   . Endometrial polyp 11/09/2012  . Enlarged lymph node    right side - arm  . Family history of ovarian cancer    MGM  . Generalized headaches   . Hemorrhoids   . Hiatal hernia   . IBS (irritable bowel syndrome)   . IBS (irritable bowel syndrome)   . Irregular bleeding 01/01/2013  . Menorrhagia 10/17/2012  . Nausea   . Nephrolithiasis   . Obesity, unspecified   . Palpitations   . PVC (premature ventricular contraction)     Past Surgical History:  Procedure Laterality Date  . ABLATION     Uterine  . COLONOSCOPY  2005   Dr. Irving Shows. Colitis of cecum and ICV, bx nonspecific  . COLONOSCOPY N/A 02/10/2015   Procedure: COLONOSCOPY;  Surgeon: Daneil Dolin, MD;  Location: [facility];  Service: Endoscopy;  Laterality: N/A;  0730-moved to  915 Office to notify  . colonoscopy with ileoscopy  2008   Dr. Laural Golden. patch of coarse mucosa at TI, bx  neg.  Random colon bx showed minimal active focal colitis ?resolving infection. Felt not c/w IBD.  Marland Kitchen DILITATION & CURRETTAGE/HYSTROSCOPY WITH THERMACHOICE ABLATION N/A 12/01/2012   Procedure: DILATATION & CURETTAGE;REMOVAL OF ENDOMETRIAL POLYP; HYSTEROSCOPY WITH THERMACHOICE ENDOMETRIAL ABLATION;  Surgeon: Jonnie Kind, MD;  Location: [facility];  Service: Gynecology;  Laterality: N/A;  . ESOPHAGOGASTRODUODENOSCOPY  02/2008   Dr. Gala Romney- birnak appearing tubular esophagus. small hiatal hernia, o/w normal stomach, D1, D2  . KIDNEY STONE SURGERY    . KNEE ARTHROSCOPY  2008   ACL repair  . LAPAROSCOPIC BILATERAL SALPINGECTOMY Bilateral 12/01/2012   Procedure: LAPAROSCOPIC BILATERAL SALPINGECTOMY ;  Surgeon: Jonnie Kind, MD;  Location: [facility];  Service: Gynecology;  Laterality: Bilateral;  . LESION REMOVAL Left 12/01/2012   Procedure: SKIN TAG REMOVAL (Left inner thigh) ;  Surgeon: Jonnie Kind, MD;  Location: [facility];  Service: Gynecology;  Laterality: Left;    Family History  Problem Relation Age of Onset  . Cancer Maternal Grandmother        ovarian cancer  . Diabetes Maternal Grandfather   . Scleroderma Mother   . Hypertension Father   . Cancer Father  liver  . Cirrhosis Father        non-alcoholic  . Stroke Paternal Grandmother   . Hypertension Paternal Grandmother   . Other Sister        Transverse myelitis  . Colon cancer Neg Hx     Social History Social History   Tobacco Use  . Smoking status: Never Smoker  . Smokeless tobacco: Never Used  Substance Use Topics  . Alcohol use: Yes    Alcohol/week: 0.0 oz    Comment: occ  . Drug use: No    Allergies  Allergen Reactions  . Zanaflex [Tizanidine Hcl] Hives  . Morphine Other (See Comments)    Neck pain  . Oxycodone-Acetaminophen Hives and Itching    Itching    Current Outpatient Medications   Medication Sig Dispense Refill  . ALPRAZolam (XANAX) 0.5 MG tablet Take 0.5 mg by mouth at bedtime as needed. Sleep.    . Biotin 1 MG CAPS Take 1 capsule by mouth as needed (when remembers).     . cholecalciferol (VITAMIN D) 1000 units tablet Take 1,000 Units by mouth daily.    . cyclobenzaprine (FLEXERIL) 10 MG tablet Take 1 tablet (10 mg total) by mouth at bedtime. One tablet every night at bedtime as needed for spasm. 30 tablet 0  . escitalopram (LEXAPRO) 10 MG tablet Take 1 tablet (10 mg total) by mouth daily. 30 tablet 6  . HYDROcodone-acetaminophen (NORCO/VICODIN) 5-325 MG tablet One tablet every four hours as needed for acute pain.  Limit of five days per [place]. 30 tablet 0  . Multiple Vitamin (MULTIVITAMIN WITH MINERALS) TABS tablet Take 1 tablet by mouth daily.    . naproxen (NAPROSYN) 500 MG tablet Take 1 tablet (500 mg total) by mouth 2 (two) times daily with a meal. (Patient taking differently: Take 500 mg by mouth as needed. ) 60 tablet 5  . nortriptyline (PAMELOR) 10 MG capsule Take 2 capsules (20 mg total) by mouth at bedtime. 180 capsule 3  . predniSONE (STERAPRED UNI-PAK 21 TAB) 5 MG (21) TBPK tablet Take 6 pills first day; 5 pills second day; 4 pills third day; 3 pills fourth day; 2 pills next day and 1 pill last day. 21 tablet 0   No current facility-administered medications for this visit.      Physical Exam  Blood pressure 121/75, pulse 89, temperature 98 F (36.7 C), height 5\' 9"  (1.753 m), weight 280 lb (127 kg).  Constitutional: overall normal hygiene, normal nutrition, well developed, normal grooming, normal body habitus. Assistive device:none  Musculoskeletal: gait and station Limp none, muscle tone and strength are normal, no tremors or atrophy is present.  .  Neurological: coordination overall normal.  Deep tendon reflex/nerve stretch intact.  Sensation normal.  Cranial nerves II-XII intact.   Skin:   Normal overall no scars, lesions, ulcers or  rashes. No psoriasis.  Psychiatric: Alert and oriented x 3.  Recent memory intact, remote memory unclear.  Normal mood and affect. Well groomed.  Good eye contact.  Cardiovascular: overall no swelling, no varicosities, no edema bilaterally, normal temperatures of the legs and arms, no clubbing, cyanosis and good capillary refill.  Lymphatic: palpation is normal.  Spine/Pelvis examination:  Inspection:  Overall, sacoiliac joint benign and hips nontender; without crepitus or defects.   Thoracic spine inspection: Alignment normal without kyphosis present   Lumbar spine inspection:  Alignment  with normal lumbar lordosis, without scoliosis apparent.   Thoracic spine palpation:  without tenderness of spinal processes  Lumbar spine palpation: with tenderness of lumbar area; with tightness of lumbar muscles    Range of Motion:   Lumbar flexion, forward flexion is 30 with pain or tenderness    Lumbar extension is 10 with pain or tenderness   Left lateral bend is Normal  with pain or tenderness   Right lateral bend is Normal with pain or tenderness   Straight leg raising is Normal   Strength & tone: Normal   Stability overall normal stability    All other systems reviewed and are negative   The patient has been educated about the nature of the problem(s) and counseled on treatment options.  The patient appeared to understand what I have discussed and is in agreement with it.  Encounter Diagnosis  Name Primary?  . Acute left-sided low back pain with left-sided sciatica Yes    PLAN Call if any problems.  Precautions discussed.  Continue current medications.   Return to clinic 2 weeks   Begin PT.  Electronically Signed Sanjuana Kava, MD 2/27/20194:57 PM

## 2017-03-18 ENCOUNTER — Encounter (HOSPITAL_COMMUNITY): Payer: Self-pay

## 2017-03-18 ENCOUNTER — Ambulatory Visit (HOSPITAL_COMMUNITY): Payer: Federal, State, Local not specified - PPO | Attending: Orthopaedic Surgery

## 2017-03-18 DIAGNOSIS — R29898 Other symptoms and signs involving the musculoskeletal system: Secondary | ICD-10-CM

## 2017-03-18 DIAGNOSIS — G8929 Other chronic pain: Secondary | ICD-10-CM | POA: Diagnosis not present

## 2017-03-18 DIAGNOSIS — M79604 Pain in right leg: Secondary | ICD-10-CM | POA: Diagnosis not present

## 2017-03-18 DIAGNOSIS — M79605 Pain in left leg: Secondary | ICD-10-CM

## 2017-03-18 DIAGNOSIS — M545 Low back pain: Secondary | ICD-10-CM | POA: Insufficient documentation

## 2017-03-18 DIAGNOSIS — M6281 Muscle weakness (generalized): Secondary | ICD-10-CM | POA: Diagnosis not present

## 2017-03-18 NOTE — Therapy (Signed)
[place], [place], [address] Phone: [phone]   Fax:  [phone]  Physical Therapy Evaluation  Patient Details  Name: Meghan [name] MRN: [id number] Date of Birth: October 25, 1970 Referring Provider: Sanjuana Kava, MD   Encounter Date: 03/18/2017  PT End of Session - 03/18/17 1036    Visit Number  1    Number of Visits  9    Date for PT Re-Evaluation  04/15/17    Authorization Type  BCBS/Federal EMP PPO    Authorization Time Period  03/18/17 to 04/15/17    PT Start Time  0815    PT Stop Time  0853    PT Time Calculation (min)  38 min    Activity Tolerance  Patient tolerated treatment well    Behavior During Therapy  [facility] for tasks assessed/performed       Past Medical History:  Diagnosis Date  . Abdominal pain    RLQ  . Chest pain, unspecified   . Colitis    see psh for colonoscopy reports  . Dyspnea   . Endometrial polyp 11/09/2012  . Enlarged lymph node    right side - arm  . Family history of ovarian cancer    MGM  . Generalized headaches   . Hemorrhoids   . Hiatal hernia   . IBS (irritable bowel syndrome)   . IBS (irritable bowel syndrome)   . Irregular bleeding 01/01/2013  . Menorrhagia 10/17/2012  . Nausea   . Nephrolithiasis   . Obesity, unspecified   . Palpitations   . PVC (premature ventricular contraction)     Past Surgical History:  Procedure Laterality Date  . ABLATION     Uterine  . COLONOSCOPY  2005   Dr. Irving Shows. Colitis of cecum and ICV, bx nonspecific  . COLONOSCOPY N/A 02/10/2015   Procedure: COLONOSCOPY;  Surgeon: Daneil Dolin, MD;  Location: [facility];  Service: Endoscopy;  Laterality: N/A;  0730-moved to 915 Office to notify  . colonoscopy with ileoscopy  2008   Dr. Laural Golden. patch of coarse mucosa at TI, bx  neg.  Random colon bx showed minimal active focal colitis ?resolving infection. Felt not c/w IBD.  Marland Kitchen DILITATION & CURRETTAGE/HYSTROSCOPY WITH THERMACHOICE  ABLATION N/A 12/01/2012   Procedure: DILATATION & CURETTAGE;REMOVAL OF ENDOMETRIAL POLYP; HYSTEROSCOPY WITH THERMACHOICE ENDOMETRIAL ABLATION;  Surgeon: Jonnie Kind, MD;  Location: [facility] ORS;  Service: Gynecology;  Laterality: N/A;  . ESOPHAGOGASTRODUODENOSCOPY  02/2008   Dr. Gala Romney- birnak appearing tubular esophagus. small hiatal hernia, o/w normal stomach, D1, D2  . KIDNEY STONE SURGERY    . KNEE ARTHROSCOPY  2008   ACL repair  . LAPAROSCOPIC BILATERAL SALPINGECTOMY Bilateral 12/01/2012   Procedure: LAPAROSCOPIC BILATERAL SALPINGECTOMY ;  Surgeon: Jonnie Kind, MD;  Location: [facility] ORS;  Service: Gynecology;  Laterality: Bilateral;  . LESION REMOVAL Left 12/01/2012   Procedure: SKIN TAG REMOVAL (Left inner thigh) ;  Surgeon: Jonnie Kind, MD;  Location: [facility] ORS;  Service: Gynecology;  Laterality: Left;    There were no vitals filed for this visit.   Subjective Assessment - 03/18/17 0817    Subjective  Pt states that she is taking a Physics class online and at night when she is sitting doing work, she gets sharp, electrical pains down both legs (in long sitting position). If she bends her knee the pain will go away. She denies any trauma, it started insidiously about 6 months ago. She reports having h/o  LBP and "bulging discs from T11-L5." She states that this is bruning across the lower back and sharp pain down the sides. Stretching her HS makes it feel a little better. She does report some LLE numbness a little bit; it feels heavier than the R.  She denies b/b changes since this started.     Limitations  Sitting    How long can you sit comfortably?  less than 5 mins    How long can you stand comfortably?  no issues    How long can you walk comfortably?  no issues    Patient Stated Goals  no pain     Currently in Pain?  Yes    Pain Score  2     Pain Location  Hip    Pain Orientation  Right    Pain Descriptors / Indicators  Dull    Pain Type  Chronic pain    Pain Onset  More than a  month ago    Pain Frequency  Intermittent    Aggravating Factors   sitting, long sitting    Pain Relieving Factors  walking,     Effect of Pain on Daily Activities  slight increase         [facility] PT Assessment - 03/18/17 0001      Assessment   Medical Diagnosis  Acute L sided LBP    Referring Provider  Sanjuana Kava, MD    Onset Date/Surgical Date  09/18/16    Next MD Visit  -- f/u with Dr. Aline Brochure in 3 weeks    Prior Therapy  yes on knee for ACL and meniscus in 2008      Balance Screen   Has the patient fallen in the past 6 months  No    Has the patient had a decrease in activity level because of a fear of falling?   No    Is the patient reluctant to leave their home because of a fear of falling?   No      Prior Function   Level of Independence  Independent    Vocation  Full time employment    Vocation Requirements  lifting, transferring patients    Leisure  shop, kids' baseball games      Observation/Other Assessments   Focus on Therapeutic Outcomes (FOTO)   51% limited      Sensation   Light Touch  Appears Intact      Posture/Postural Control   Posture/Postural Control  Postural limitations    Postural Limitations  Forward head;Increased thoracic kyphosis;Increased lumbar lordosis      ROM / Strength   AROM / PROM / Strength  AROM;Strength      AROM   AROM Assessment Site  Lumbar    Lumbar Flexion  WFL, RFISx10 reps: a little increase    Lumbar Extension  WNL, hinge point ~L3-4    Lumbar - Right Side Bend  WNL, pain on R    Lumbar - Left Side Bend  WNL, pain on R    Lumbar - Right Rotation  WNL, non-painful    Lumbar - Left Rotation  WNL, non-painful      Strength   Strength Assessment Site  Hip;Knee;Ankle    Right Hip Flexion  4+/5    Right Hip Extension  4/5    Right Hip ABduction  4/5    Left Hip Flexion  4+/5    Left Hip Extension  4/5    Left Hip ABduction  4/5  Right Knee Flexion  5/5    Right Knee Extension  5/5    Left Knee Flexion  5/5     Left Knee Extension  5/5    Right Ankle Dorsiflexion  5/5    Left Ankle Dorsiflexion  5/5      Flexibility   Soft Tissue Assessment /Muscle Length  yes    Hamstrings  tight BLE    Quadriceps  WNL    ITB  tender to palpation; perform Ober's test next visit    Piriformis  WFL      Palpation   Spinal mobility  hypomobile lumbar spine    Palpation comment  increased restrictions, palpable knots and recreation of pt's same pain with palpation to bil glutes, piriformis, ITB, and lateral VLO, bilaterally L>R      Special Tests    Special Tests  Lumbar;Sacrolliac Tests    Lumbar Tests  Straight Leg Raise    Sacroiliac Tests   -- cluster testing negative as only sacral thrust + for pain      Straight Leg Raise   Findings  Negative    Comment  BLE      Balance   Balance Assessed  Yes      Static Standing Balance   Static Standing - Balance Support  No upper extremity supported    Static Standing Balance -  Activities   Single Leg Stance - Right Leg;Single Leg Stance - Left Leg    Static Standing - Comment/# of Minutes  R: 25 sec, L: 29 sec; trendelenberg noted, L>R, generally unstable during requiring compensations          Objective measurements completed on examination: See above findings.      PT Education - 03/18/17 1036    Education provided  Yes    Education Details  exam findings, POC, HEP    Person(s) Educated  Patient    Methods  Explanation;Demonstration;Handout    Comprehension  Verbalized understanding;Returned demonstration       PT Short Term Goals - 03/18/17 1049      PT SHORT TERM GOAL #1   Title  Pt will be independent with HEP and perform consistently in order to decrease pain and promote self-management at home.    Time  2    Period  Weeks    Status  New    Target Date  04/01/17      PT SHORT TERM GOAL #2   Title  Pt will be able to perform bil SLS for 30 sec or > with no evidence of unsteadiness and no Trendelenberg sign to demo improved  functional gluteal strength and further maximize gait.    Time  2    Period  Weeks    Status  New        PT Long Term Goals - 03/18/17 1051      PT LONG TERM GOAL #1   Title  Pt will have improved proximal hip strength by at least 1/2 grade bilaterally in order to decrease pain and maximize overall function.    Time  4    Period  Weeks    Status  New    Target Date  04/15/17      PT LONG TERM GOAL #2   Title  Pt will report minimal to no tenderness to palpation of bil glutes, piriformis, ITB, and lateral VLO to demonstrate improved soft tissue restrictions and flexibility in order to decrease pt's overall c/o pain.  Time  4    Period  Weeks    Status  New      PT LONG TERM GOAL #3   Title  Pt will report being able to sit in long sitting position for at least 30 mins or > with 2/10 BLE pain or < in order to maximize pt's ability to study at home comfortably.    Time  4    Period  Weeks    Status  New      PT LONG TERM GOAL #4   Title  Pt will report being able to load/unload her dishwasher and perform other household chores without pain to demo improved functional strength and maximize function at home.    Time  4    Period  Weeks    Status  New             Plan - 03/18/17 0857    Clinical Impression Statement  Pt is pleasant 46YO F who presents to OPPT with c/o LBP and BLE pain. She reports that it only occurs when she is sitting, particularly in long sitting position. She presents with deficits in bil proximal hip musculature, functional strength, spinal hypomobility, posture, and increased soft tissue restrictions of bil glutes, piriformis, lateral VLO, ITB, L>R throughout. Pt reporting that palpation to glutes, piriformis, ITB and lateral VLO all recreated her same pain. PT feels pt's complaints are likely due to restrictions and flexibility deficits in bil glutes, piriformis, ITB, and VLO. Pt needs skilled PT intervention to address these deficits in order to  decrease pain and maximize her function at home.     History and Personal Factors relevant to plan of care:  dyspnea, hiatal hernia, IBS, PVC; motivated to participate, active lifestlye    Clinical Presentation  Stable    Clinical Presentation due to:  MMT, functional strength, posture, soft tissue restrictions, spinal mobility    Clinical Decision Making  Low    Rehab Potential  Good    PT Frequency  2x / week    PT Duration  4 weeks    PT Treatment/Interventions  ADLs/Self Care Home Management;Cryotherapy;Electrical Stimulation;Moist Heat;Traction;Ultrasound;Gait training;Stair training;Functional mobility training;Therapeutic activities;Therapeutic exercise;Balance training;Neuromuscular re-education;Patient/family education;Manual techniques;Passive range of motion;Dry needling;Taping    PT Next Visit Plan  review goals, perform Ober's test bil to assess ITB tightness; manual STM/MFR for soft tissue restrictions, continue stretching and add prone quad stretch with rope, initiate BLE and functional strengthening; postural strengthening, 3D thoracic excursions for mobility    PT Home Exercise Plan  eval: SKTC, seated figure 4 stretch    Consulted and Agree with Plan of Care  Patient       Patient will benefit from skilled therapeutic intervention in order to improve the following deficits and impairments:  Decreased balance, Decreased strength, Hypomobility, Increased fascial restricitons, Increased muscle spasms, Impaired flexibility, Improper body mechanics, Postural dysfunction, Pain  Visit Diagnosis: Pain in left leg - Plan: PT plan of care cert/re-cert  Pain in right leg - Plan: PT plan of care cert/re-cert  Chronic bilateral low back pain without sciatica - Plan: PT plan of care cert/re-cert  Muscle weakness (generalized) - Plan: PT plan of care cert/re-cert  Other symptoms and signs involving the musculoskeletal system - Plan: PT plan of care cert/re-cert     Problem  List Patient Active Problem List   Diagnosis Date Noted  . Encounter for well woman exam with routine gynecological exam 01/07/2017  . Irregular heart beat 01/07/2017  .  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 Stress headaches 06/23/2015  . Excessive daytime sleepiness 06/23/2015  . Morbid obesity due to excess calories ([place]) 06/23/2015  . Change in bowel habits   . Heme + stool   . Heme positive stool 12/20/2014  . Bowel habit changes 12/20/2014  . Dysesthesia of multiple sites 08/29/2013  . Migraine without aura and without status migrainosus, not intractable 08/01/2013  . Irregular bleeding 01/01/2013  . Endometrial polyp 11/09/2012  . Menorrhagia 10/17/2012  . Upper abdominal pain 10/06/2011  . GERD (gastroesophageal reflux disease) 08/21/2010  . Abdominal pain 08/21/2010  . Cervical lymphadenopathy 08/21/2010  . Localized swelling, mass, or lump of upper extremity 08/21/2010  . Low back pain radiating to both legs 08/19/2010  . Neck pain, chronic 08/19/2010  . Numbness and tingling in hands 08/19/2010  . PREMATURE VENTRICULAR CONTRACTIONS 07/17/2008  . PALPITATIONS 07/17/2008  . DYSPNEA 07/17/2008  . CHEST PAIN-UNSPECIFIED 07/17/2008  . OBESITY 09/21/2007  . COLITIS 09/21/2007  . CONSTIPATION 09/21/2007  . IBS 09/21/2007  . NAUSEA 09/21/2007  . Diarrhea 09/21/2007  . Abdominal pain 09/21/2007       Geraldine Solar PT, DPT  [place] [address] [place], [place], [address] Phone: [phone]   Fax:  [phone]  Name: Meghan [name] MRN: [id number] Date of Birth: 1970-04-03

## 2017-03-18 NOTE — Patient Instructions (Signed)
  SINGLE KNEE TO CHEST STRETCH - [place]  While Lying on your back, hold your knee and gently pull it up towards your chest.  Perform 1-2x/day, 3-5 stretches holding for 30-60 seconds   PIRIFORMIS AND HIP STRETCH - SEATED  While sitting in a chair, cross your affected leg on top of the other as shown.   Next, gently lean forward until a stretch is felt along the crossed leg.  Perform 1-2x/day, 3-5 stretches holding for 30-60 seconds

## 2017-03-22 ENCOUNTER — Encounter (HOSPITAL_COMMUNITY): Payer: Self-pay

## 2017-03-22 ENCOUNTER — Ambulatory Visit (HOSPITAL_COMMUNITY): Payer: Federal, State, Local not specified - PPO

## 2017-03-22 DIAGNOSIS — R29898 Other symptoms and signs involving the musculoskeletal system: Secondary | ICD-10-CM

## 2017-03-22 DIAGNOSIS — M545 Low back pain, unspecified: Secondary | ICD-10-CM

## 2017-03-22 DIAGNOSIS — M79604 Pain in right leg: Secondary | ICD-10-CM

## 2017-03-22 DIAGNOSIS — M79605 Pain in left leg: Secondary | ICD-10-CM | POA: Diagnosis not present

## 2017-03-22 DIAGNOSIS — G8929 Other chronic pain: Secondary | ICD-10-CM | POA: Diagnosis not present

## 2017-03-22 DIAGNOSIS — M6281 Muscle weakness (generalized): Secondary | ICD-10-CM

## 2017-03-22 NOTE — Therapy (Signed)
[place], [place], [address] Phone: [phone]   Fax:  [phone]  Physical Therapy Treatment  Patient Details  Name: AMEIA MORENCY MRN: [id number] Date of Birth: 15-Aug-1970 Referring Provider: Sanjuana Kava, MD   Encounter Date: 03/22/2017  PT End of Session - 03/22/17 1125    Visit Number  2    Number of Visits  9    Date for PT Re-Evaluation  04/15/17    Authorization Type  BCBS/Federal EMP PPO    Authorization Time Period  03/18/17 to 04/15/17    PT Start Time  1120    PT Stop Time  1207    PT Time Calculation (min)  47 min    Activity Tolerance  Patient tolerated treatment well    Behavior During Therapy  [facility] for tasks assessed/performed       Past Medical History:  Diagnosis Date  . Abdominal pain    RLQ  . Chest pain, unspecified   . Colitis    see psh for colonoscopy reports  . Dyspnea   . Endometrial polyp 11/09/2012  . Enlarged lymph node    right side - arm  . Family history of ovarian cancer    MGM  . Generalized headaches   . Hemorrhoids   . Hiatal hernia   . IBS (irritable bowel syndrome)   . IBS (irritable bowel syndrome)   . Irregular bleeding 01/01/2013  . Menorrhagia 10/17/2012  . Nausea   . Nephrolithiasis   . Obesity, unspecified   . Palpitations   . PVC (premature ventricular contraction)     Past Surgical History:  Procedure Laterality Date  . ABLATION     Uterine  . COLONOSCOPY  2005   Dr. Irving Shows. Colitis of cecum and ICV, bx nonspecific  . COLONOSCOPY N/A 02/10/2015   Procedure: COLONOSCOPY;  Surgeon: Daneil Dolin, MD;  [facility];  Service: Endoscopy;  Laterality: N/A;  0730-moved to 915 Office to notify  . colonoscopy with ileoscopy  2008   Dr. Laural Golden. patch of coarse mucosa at TI, bx  neg.  Random colon bx showed minimal active focal colitis ?resolving infection. Felt not c/w IBD.  Marland Kitchen DILITATION & CURRETTAGE/HYSTROSCOPY WITH THERMACHOICE  ABLATION N/A 12/01/2012   Procedure: DILATATION & CURETTAGE;REMOVAL OF ENDOMETRIAL POLYP; HYSTEROSCOPY WITH THERMACHOICE ENDOMETRIAL ABLATION;  Surgeon: Jonnie Kind, MD;  Location: [facility] ORS;  Service: Gynecology;  Laterality: N/A;  . ESOPHAGOGASTRODUODENOSCOPY  02/2008   Dr. Gala Romney- birnak appearing tubular esophagus. small hiatal hernia, o/w normal stomach, D1, D2  . KIDNEY STONE SURGERY    . KNEE ARTHROSCOPY  2008   ACL repair  . LAPAROSCOPIC BILATERAL SALPINGECTOMY Bilateral 12/01/2012   Procedure: LAPAROSCOPIC BILATERAL SALPINGECTOMY ;  Surgeon: Jonnie Kind, MD;  Location: [facility]S;  Service: Gynecology;  Laterality: Bilateral;  . LESION REMOVAL Left 12/01/2012   Procedure: SKIN TAG REMOVAL (Left inner thigh) ;  Surgeon: Jonnie Kind, MD;  Location: [facility];  Service: Gynecology;  Laterality: Left;    There were no vitals filed for this visit.  Subjective Assessment - 03/22/17 1122    Subjective  Pt stated she is feeling good today, has been moving pt.s earlier today and feels she may have LBP later today.  Reports compliance wiht HEP wihtout questions, does like the piriformis    Patient Stated Goals  no pain     Currently in Pain?  No/denies  [facility] PT Assessment - 03/22/17 0001      Assessment   Medical Diagnosis  Acute L sided LBP    Referring Provider  Sanjuana Kava, MD    Onset Date/Surgical Date  09/18/16    Next MD Visit  -- f/u with Dr. Aline Brochure in 3 weeks    Prior Therapy  yes on knee for ACL and meniscus in 2008      Flexibility   ITB  Ober's test Lt positive; Rt negative; tender palpation          03/22/17 0001  Exercises  Exercises Lumbar  Lumbar Exercises: Stretches  Active Hamstring Stretch 3 reps;30 seconds  Active Hamstring Stretch Limitations supine  Single Knee to Chest Stretch 2 reps;30 seconds  Quad Stretch Right;Left;3 reps;30 seconds  Quad Stretch Limitations prone with rope  Figure 4 Stretch 3 reps;30 seconds  Figure 4 Stretch  Limitations seated bilateral  Lumbar Exercises: Supine  Bridge 10 reps  Manual Therapy  Manual Therapy Myofascial release  Manual therapy comments Manual complete separate than rest of tx  Myofascial Release Lt gluteal mm primarly glut med and piriformis in prone        PT Education - 03/22/17 1137    Education provided  Yes    Education Details  Reviewed goals, assured complaince and proper form with HEP and copy of eval given to pt.      Person(s) Educated  Patient    Methods  Explanation;Demonstration;Handout    Comprehension  Verbalized understanding;Returned demonstration       PT Short Term Goals - 03/18/17 1049      PT SHORT TERM GOAL #1   Title  Pt will be independent with HEP and perform consistently in order to decrease pain and promote self-management at home.    Time  2    Period  Weeks    Status  New    Target Date  04/01/17      PT SHORT TERM GOAL #2   Title  Pt will be able to perform bil SLS for 30 sec or > with no evidence of unsteadiness and no Trendelenberg sign to demo improved functional gluteal strength and further maximize gait.    Time  2    Period  Weeks    Status  New        PT Long Term Goals - 03/18/17 1051      PT LONG TERM GOAL #1   Title  Pt will have improved proximal hip strength by at least 1/2 grade bilaterally in order to decrease pain and maximize overall function.    Time  4    Period  Weeks    Status  New    Target Date  04/15/17      PT LONG TERM GOAL #2   Title  Pt will report minimal to no tenderness to palpation of bil glutes, piriformis, ITB, and lateral VLO to demonstrate improved soft tissue restrictions and flexibility in order to decrease pt's overall c/o pain.    Time  4    Period  Weeks    Status  New      PT LONG TERM GOAL #3   Title  Pt will report being able to sit in long sitting position for at least 30 mins or > with 2/10 BLE pain or < in order to maximize pt's ability to study at home comfortably.    Time   4    Period  Weeks  Status  New      PT LONG TERM GOAL #4   Title  Pt will report being able to load/unload her dishwasher and perform other household chores without pain to demo improved functional strength and maximize function at home.    Time  4    Period  Weeks    Status  New            Plan - 03/22/17 1138    Clinical Impression Statement  Reviewed goals, assured complaince with HEP and copy of eval given to pt.  Ober's testing complete with tenderness and tightness/tenderness on Lt side ITB.  Session focus with stretches to improve muscle lengthening for pain control and manual soft tissue mobilization to address restrictions.  EOS with manual soft tissue mobilizaiton for pain control, noted trigger point Lt piriformis with initial reports of sharp pain increase to 8/10 with palpation, pt reports relief at EOS wtih just soreness and pain reduced to 4-5/10.    Rehab Potential  Good    PT Frequency  2x / week    PT Duration  4 weeks    PT Treatment/Interventions  ADLs/Self Care Home Management;Cryotherapy;Electrical Stimulation;Moist Heat;Traction;Ultrasound;Gait training;Stair training;Functional mobility training;Therapeutic activities;Therapeutic exercise;Balance training;Neuromuscular re-education;Patient/family education;Manual techniques;Passive range of motion;Dry needling;Taping    PT Next Visit Plan  Add ITB stretch next session.  Continue manual STM/MFR for soft tissue restrictions, continue stretching and continue prone quad stretch with rope, initiate BLE and functional strengthening; postural strengthening, 3D thoracic excursions for mobility    PT Home Exercise Plan  eval: SKTC, seated figure 4 stretch       Patient will benefit from skilled therapeutic intervention in order to improve the following deficits and impairments:  Decreased balance, Decreased strength, Hypomobility, Increased fascial restricitons, Increased muscle spasms, Impaired flexibility, Improper  body mechanics, Postural dysfunction, Pain  Visit Diagnosis: Pain in right leg  Chronic bilateral low back pain without sciatica  Muscle weakness (generalized)  Other symptoms and signs involving the musculoskeletal system  Pain in left leg     Problem List Patient Active Problem List   Diagnosis Date Noted  . Encounter for well woman exam with routine gynecological exam 01/07/2017  . Irregular heart beat 01/07/2017  . 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 Stress headaches 06/23/2015  . Excessive daytime sleepiness 06/23/2015  . Morbid obesity due to excess calories ([place]) 06/23/2015  . Change in bowel habits   . Heme + stool   . Heme positive stool 12/20/2014  . Bowel habit changes 12/20/2014  . Dysesthesia of multiple sites 08/29/2013  . Migraine without aura and without status migrainosus, not intractable 08/01/2013  . Irregular bleeding 01/01/2013  . Endometrial polyp 11/09/2012  . Menorrhagia 10/17/2012  . Upper abdominal pain 10/06/2011  . GERD (gastroesophageal reflux disease) 08/21/2010  . Abdominal pain 08/21/2010  . Cervical lymphadenopathy 08/21/2010  . Localized swelling, mass, or lump of upper extremity 08/21/2010  . Low back pain radiating to both legs 08/19/2010  . Neck pain, chronic 08/19/2010  . Numbness and tingling in hands 08/19/2010  . PREMATURE VENTRICULAR CONTRACTIONS 07/17/2008  . PALPITATIONS 07/17/2008  . DYSPNEA 07/17/2008  . CHEST PAIN-UNSPECIFIED 07/17/2008  . OBESITY 09/21/2007  . COLITIS 09/21/2007  . CONSTIPATION 09/21/2007  . IBS 09/21/2007  . NAUSEA 09/21/2007  . Diarrhea 09/21/2007  . Abdominal pain 09/21/2007   Ihor Austin, [place]; CBIS [phone]  Aldona Lento 03/22/2017, 1:12 PM  [place], [place],  [place] Phone: [phone]   Fax:  [phone]  Name: JANNE FAULK MRN: [id number] Date of Birth: [birth year]

## 2017-03-23 ENCOUNTER — Encounter: Payer: Self-pay | Admitting: Cardiovascular Disease

## 2017-03-23 ENCOUNTER — Ambulatory Visit (INDEPENDENT_AMBULATORY_CARE_PROVIDER_SITE_OTHER): Payer: Federal, State, Local not specified - PPO | Admitting: Cardiovascular Disease

## 2017-03-23 VITALS — BP 126/74 | HR 78 | Ht 69.0 in | Wt 280.0 lb

## 2017-03-23 DIAGNOSIS — R002 Palpitations: Secondary | ICD-10-CM

## 2017-03-23 NOTE — Patient Instructions (Signed)
Medication Instructions:  Your physician recommends that you continue on your current medications as directed. Please refer to the Current Medication list given to you today.   Labwork: NONE   Testing/Procedures: NONE   Follow-Up: Your physician recommends that you schedule a follow-up appointment in: As Needed with Dr. Bronson Ing   Any Other Special Instructions Will Be Listed Below (If Applicable).     If you need a refill on your cardiac medications before your next appointment, please call your pharmacy. Thank you for choosing [facility]!

## 2017-03-23 NOTE — Progress Notes (Signed)
SUBJECTIVE: The patient returns for follow-up after undergoing cardiovascular testing performed for the evaluation of palpitations.  Event monitoring demonstrated very rare PVCs with no significant arrhythmias.  No symptoms correlated with sinus rhythm.  She told me she thinks she has significant GERD.  She ate peanut M&M's at work one day and then was awoken with chest pain with symptom alleviation after lying flat on her abdomen.  She has a hiatal hernia as well.  She tried eating peanut M&M's on another day and she had similar symptoms.  She is trying to exercise and eat better to lose weight.  She is taking part in our healthcare network's wellness program.     Review of Systems: As per "subjective", otherwise negative.  Allergies  Allergen Reactions  . Zanaflex [Tizanidine Hcl] Hives  . Morphine Other (See Comments)    Neck pain  . Oxycodone-Acetaminophen Hives and Itching    Itching    Current Outpatient Medications  Medication Sig Dispense Refill  . ALPRAZolam (XANAX) 0.5 MG tablet Take 0.5 mg by mouth at bedtime as needed. Sleep.    . Biotin 1 MG CAPS Take 1 capsule by mouth as needed (when remembers).     . cholecalciferol (VITAMIN D) 1000 units tablet Take 1,000 Units by mouth daily.    . cyclobenzaprine (FLEXERIL) 10 MG tablet Take 1 tablet (10 mg total) by mouth at bedtime. One tablet every night at bedtime as needed for spasm. 30 tablet 0  . HYDROcodone-acetaminophen (NORCO/VICODIN) 5-325 MG tablet One tablet every four hours as needed for acute pain.  Limit of five days per [place]. 30 tablet 0  . Multiple Vitamin (MULTIVITAMIN WITH MINERALS) TABS tablet Take 1 tablet by mouth daily.    . naproxen (NAPROSYN) 500 MG tablet Take 1 tablet (500 mg total) by mouth 2 (two) times daily with a meal. (Patient taking differently: Take 500 mg by mouth as needed. ) 60 tablet 5  . nortriptyline (PAMELOR) 10 MG capsule Take 2 capsules (20 mg total) by mouth at bedtime.  180 capsule 3   No current facility-administered medications for this visit.     Past Medical History:  Diagnosis Date  . Abdominal pain    RLQ  . Chest pain, unspecified   . Colitis    see psh for colonoscopy reports  . Dyspnea   . Endometrial polyp 11/09/2012  . Enlarged lymph node    right side - arm  . Family history of ovarian cancer    MGM  . Generalized headaches   . Hemorrhoids   . Hiatal hernia   . IBS (irritable bowel syndrome)   . IBS (irritable bowel syndrome)   . Irregular bleeding 01/01/2013  . Menorrhagia 10/17/2012  . Nausea   . Nephrolithiasis   . Obesity, unspecified   . Palpitations   . PVC (premature ventricular contraction)     Past Surgical History:  Procedure Laterality Date  . ABLATION     Uterine  . COLONOSCOPY  2005   Dr. Irving Shows. Colitis of cecum and ICV, bx nonspecific  . COLONOSCOPY N/A 02/10/2015   Procedure: COLONOSCOPY;  Surgeon: Daneil Dolin, MD;  [facility];  Service: Endoscopy;  Laterality: N/A;  0730-moved to 915 Office to notify  . colonoscopy with ileoscopy  2008   Dr. Laural Golden. patch of coarse mucosa at TI, bx  neg.  Random colon bx showed minimal active focal colitis ?resolving infection. Felt not c/w IBD.  Marland Kitchen  DILITATION & CURRETTAGE/HYSTROSCOPY WITH THERMACHOICE ABLATION N/A 12/01/2012   Procedure: DILATATION & CURETTAGE;REMOVAL OF ENDOMETRIAL POLYP; HYSTEROSCOPY WITH THERMACHOICE ENDOMETRIAL ABLATION;  Surgeon: Jonnie Kind, MD;  Location: [facility];  Service: Gynecology;  Laterality: N/A;  . ESOPHAGOGASTRODUODENOSCOPY  02/2008   Dr. Gala Romney- birnak appearing tubular esophagus. small hiatal hernia, o/w normal stomach, D1, D2  . KIDNEY STONE SURGERY    . KNEE ARTHROSCOPY  2008   ACL repair  . LAPAROSCOPIC BILATERAL SALPINGECTOMY Bilateral 12/01/2012   Procedure: LAPAROSCOPIC BILATERAL SALPINGECTOMY ;  Surgeon: Jonnie Kind, MD;  Location: [facility];  Service: Gynecology;  Laterality: Bilateral;  . LESION REMOVAL  Left 12/01/2012   Procedure: SKIN TAG REMOVAL (Left inner thigh) ;  Surgeon: Jonnie Kind, MD;  Location: [facility];  Service: Gynecology;  Laterality: Left;    Social History   Socioeconomic History  . Marital status: Married    Spouse name: Keenan Bachelor  . Number of children: 2  . Years of education: 23  . Highest education level: Not on file  Social Needs  . Financial resource strain: Not on file  . Food insecurity - worry: Not on file  . Food insecurity - inability: Not on file  . Transportation needs - medical: Not on file  . Transportation needs - non-medical: Not on file  Occupational History  . 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 Occupation: [employer]    Employer: [employer]  Tobacco Use  . Smoking status: Never Smoker  . Smokeless tobacco: Never Used  Substance and Sexual Activity  . Alcohol use: Yes    Alcohol/week: 0.0 oz    Comment: occ  . Drug use: No  . Sexual activity: Yes    Birth control/protection: None, Other-see comments, Surgical    Comment: vasectomy; ablation  Other Topics Concern  . Not on file  Social History Narrative   Patient is married Keenan Bachelor) and lives at home with her husband and two children.   Patient works full-time at [employer].   Patient has a Associates degree.   Patient is right-handed.   Patient drinks tea occasionally.        Vitals:   03/23/17 0847  BP: 126/74  Pulse: 78  SpO2: 98%  Weight: 280 lb (127 kg)  Height: 5\' 9"  (1.753 m)    Wt Readings from Last 3 Encounters:  03/23/17 280 lb (127 kg)  03/16/17 280 lb (127 kg)  03/08/17 280 lb (127 kg)     PHYSICAL EXAM General: NAD HEENT: Normal. Neck: No JVD, no thyromegaly. Lungs: Clear to auscultation bilaterally with normal respiratory effort. CV: Regular rate and rhythm, normal S1/S2, no S3/S4, no murmur. No pretibial or periankle edema.  No carotid bruit.   Abdomen: Soft, nontender, no distention.    Neurologic: Alert and oriented.  Psych: Normal affect. Skin: Normal. Musculoskeletal: No gross deformities.    ECG: Most recent ECG reviewed.   Labs: Lab Results  Component Value Date/Time   K 3.9 09/14/2016 12:43 PM   BUN 16 09/14/2016 12:43 PM   CREATININE 0.61 09/14/2016 12:43 PM   ALT 14 09/14/2016 12:43 PM   HGB 13.5 09/14/2016 12:43 PM     Lipids: Lab Results  Component Value Date/Time   LDLCALC 97 09/14/2016 12:43 PM   CHOL 163 09/14/2016 12:43 PM   TRIG 51 09/14/2016 12:43 PM   HDL 56 09/14/2016 12:43 PM  ASSESSMENT AND PLAN: 1.  Palpitations: Event monitoring demonstrated very rare PVCs with no significant arrhythmias.  No symptoms correlated with sinus rhythm.  Symptoms may be related to GERD.  We talked about exercise and dietary modification.    Disposition: Follow up prn   Kate Sable, M.D., F.A.C.C.

## 2017-03-24 ENCOUNTER — Encounter (INDEPENDENT_AMBULATORY_CARE_PROVIDER_SITE_OTHER): Payer: Federal, State, Local not specified - PPO

## 2017-03-29 ENCOUNTER — Ambulatory Visit (HOSPITAL_COMMUNITY): Payer: Federal, State, Local not specified - PPO

## 2017-03-29 ENCOUNTER — Telehealth (HOSPITAL_COMMUNITY): Payer: Self-pay | Admitting: Internal Medicine

## 2017-03-29 NOTE — Telephone Encounter (Signed)
03/29/17  pt cx because she said she didn't have a car today... she will be here at her next appt and can reschedule this one.

## 2017-04-01 ENCOUNTER — Encounter (HOSPITAL_COMMUNITY): Payer: Self-pay

## 2017-04-01 ENCOUNTER — Ambulatory Visit (HOSPITAL_COMMUNITY): Payer: Federal, State, Local not specified - PPO

## 2017-04-01 ENCOUNTER — Ambulatory Visit: Payer: Federal, State, Local not specified - PPO | Admitting: Orthopedic Surgery

## 2017-04-01 DIAGNOSIS — R29898 Other symptoms and signs involving the musculoskeletal system: Secondary | ICD-10-CM | POA: Diagnosis not present

## 2017-04-01 DIAGNOSIS — M79604 Pain in right leg: Secondary | ICD-10-CM

## 2017-04-01 DIAGNOSIS — G8929 Other chronic pain: Secondary | ICD-10-CM | POA: Diagnosis not present

## 2017-04-01 DIAGNOSIS — M545 Low back pain, unspecified: Secondary | ICD-10-CM

## 2017-04-01 DIAGNOSIS — M79605 Pain in left leg: Secondary | ICD-10-CM | POA: Diagnosis not present

## 2017-04-01 DIAGNOSIS — M6281 Muscle weakness (generalized): Secondary | ICD-10-CM | POA: Diagnosis not present

## 2017-04-01 NOTE — Therapy (Signed)
[name], [place], [address] Phone: [phone]   Fax:  [phone]  Physical Therapy Treatment  Patient Details  Name: Meghan [name] MRN: [id number] Date of Birth: 18-May-1970 Referring Provider: Sanjuana Kava, MD   Encounter Date: 04/01/2017  PT End of Session - 04/01/17 1352    Visit Number  3    Number of Visits  9    Date for PT Re-Evaluation  04/15/17    Authorization Type  BCBS/Federal EMP PPO    Authorization Time Period  03/18/17 to 04/15/17    PT Start Time  1348    PT Stop Time  1432    PT Time Calculation (min)  44 min    Activity Tolerance  Patient tolerated treatment well    Behavior During Therapy  [facility] for tasks assessed/performed       Past Medical History:  Diagnosis Date  . Abdominal pain    RLQ  . Chest pain, unspecified   . Colitis    see psh for colonoscopy reports  . Dyspnea   . Endometrial polyp 11/09/2012  . Enlarged lymph node    right side - arm  . Family history of ovarian cancer    MGM  . Generalized headaches   . Hemorrhoids   . Hiatal hernia   . IBS (irritable bowel syndrome)   . IBS (irritable bowel syndrome)   . Irregular bleeding 01/01/2013  . Menorrhagia 10/17/2012  . Nausea   . Nephrolithiasis   . Obesity, unspecified   . Palpitations   . PVC (premature ventricular contraction)     Past Surgical History:  Procedure Laterality Date  . ABLATION     Uterine  . COLONOSCOPY  2005   Dr. Irving Shows. Colitis of cecum and ICV, bx nonspecific  . COLONOSCOPY N/A 02/10/2015   Procedure: COLONOSCOPY;  Surgeon: Daneil Dolin, MD;  [facility];  Service: Endoscopy;  Laterality: N/A;  0730-moved to 915 Office to notify  . colonoscopy with ileoscopy  2008   Dr. Laural Golden. patch of coarse mucosa at TI, bx  neg.  Random colon bx showed minimal active focal colitis ?resolving infection. Felt not c/w IBD.  Marland Kitchen DILITATION & CURRETTAGE/HYSTROSCOPY WITH THERMACHOICE  ABLATION N/A 12/01/2012   Procedure: DILATATION & CURETTAGE;REMOVAL OF ENDOMETRIAL POLYP; HYSTEROSCOPY WITH THERMACHOICE ENDOMETRIAL ABLATION;  Surgeon: Jonnie Kind, MD;  Location: [facility];  Service: Gynecology;  Laterality: N/A;  . ESOPHAGOGASTRODUODENOSCOPY  02/2008   Dr. Gala Romney- birnak appearing tubular esophagus. small hiatal hernia, o/w normal stomach, D1, D2  . KIDNEY STONE SURGERY    . KNEE ARTHROSCOPY  2008   ACL repair  . LAPAROSCOPIC BILATERAL SALPINGECTOMY Bilateral 12/01/2012   Procedure: LAPAROSCOPIC BILATERAL SALPINGECTOMY ;  Surgeon: Jonnie Kind, MD;  Location: [facility];  Service: Gynecology;  Laterality: Bilateral;  . LESION REMOVAL Left 12/01/2012   Procedure: SKIN TAG REMOVAL (Left inner thigh) ;  Surgeon: Jonnie Kind, MD;  Location: [facility];  Service: Gynecology;  Laterality: Left;    There were no vitals filed for this visit.  Subjective Assessment - 04/01/17 1351    Subjective  Pt states that at night when she is trying to sleep, both of her legs are aching and throbbing and are keeping her up at night. Sitting in long sitting position is not as bad.     Patient Stated Goals  no pain     Currently in Pain?  No/denies  [facility] PT Treatment/Exercise - 04/01/17 0001      Lumbar Exercises: Stretches   Active Hamstring Stretch  Right;Left;2 reps;30 seconds    Active Hamstring Stretch Limitations  supine with rope    Single Knee to Chest Stretch  2 reps;30 seconds    Single Knee to Chest Stretch Limitations  BLE    Quad Stretch  Right;Left;3 reps;30 seconds    ITB Stretch  Right;Left;2 reps;30 seconds;Limitations    ITB Stretch Limitations  sidelying      Lumbar Exercises: Standing   Other Standing Lumbar Exercises  fwd step ups on 6" step x10 reps each      Lumbar Exercises: Supine   Bridge  10 reps    Single Leg Bridge  10 reps BLE      Lumbar Exercises: Sidelying   Hip Abduction  Both;15 reps      Manual Therapy   Manual Therapy   Myofascial release    Manual therapy comments  Manual complete separate than rest of tx    Myofascial Release  RT/ Lt gluteal mm primarly glut med and piriformis in prone             PT Education - 04/01/17 1432    Education provided  Yes    Education Details  exercise technique, updated stretchign HEP    Person(s) Educated  Patient    Methods  Explanation;Demonstration;Handout    Comprehension  Verbalized understanding;Returned demonstration       PT Short Term Goals - 03/18/17 1049      PT SHORT TERM GOAL #1   Title  Pt will be independent with HEP and perform consistently in order to decrease pain and promote self-management at home.    Time  2    Period  Weeks    Status  New    Target Date  04/01/17      PT SHORT TERM GOAL #2   Title  Pt will be able to perform bil SLS for 30 sec or > with no evidence of unsteadiness and no Trendelenberg sign to demo improved functional gluteal strength and further maximize gait.    Time  2    Period  Weeks    Status  New        PT Long Term Goals - 03/18/17 1051      PT LONG TERM GOAL #1   Title  Pt will have improved proximal hip strength by at least 1/2 grade bilaterally in order to decrease pain and maximize overall function.    Time  4    Period  Weeks    Status  New    Target Date  04/15/17      PT LONG TERM GOAL #2   Title  Pt will report minimal to no tenderness to palpation of bil glutes, piriformis, ITB, and lateral VLO to demonstrate improved soft tissue restrictions and flexibility in order to decrease pt's overall c/o pain.    Time  4    Period  Weeks    Status  New      PT LONG TERM GOAL #3   Title  Pt will report being able to sit in long sitting position for at least 30 mins or > with 2/10 BLE pain or < in order to maximize pt's ability to study at home comfortably.    Time  4    Period  Weeks    Status  New      PT LONG TERM GOAL #  4   Title  Pt will report being able to load/unload her dishwasher and  perform other household chores without pain to demo improved functional strength and maximize function at home.    Time  4    Period  Weeks    Status  New            Plan - 04/01/17 1437    Clinical Impression Statement  Continued with established POC working on flexibility of BLE. Added ITB stretch in sidelying this date with good tolerance. Also added sidelying hip abd for improved gluteal strengthening. Pt continues with palpable knots and restrictions in bil glutes, L>R, and reporting recreation of same pain during palpation. PT discussed potentially dry needling these areas and discussed benefits and risks; will f/u with this and address accordingly. Pt reported feeling great at EOS following manual. She did report having some anterior thigh/groin pain and stated that she ran on the TM and participated in a body pump class at the gym; PT explained that this pain is likely due to her hip flexor having increased soreness. Updated HEP this dat to include ITB and prone quad stretch. Continue as planned, progressing as able.    Rehab Potential  Good    PT Frequency  2x / week    PT Duration  4 weeks    PT Treatment/Interventions  ADLs/Self Care Home Management;Cryotherapy;Electrical Stimulation;Moist Heat;Traction;Ultrasound;Gait training;Stair training;Functional mobility training;Therapeutic activities;Therapeutic exercise;Balance training;Neuromuscular re-education;Patient/family education;Manual techniques;Passive range of motion;Dry needling;Taping    PT Next Visit Plan  potentially dry needling for glutes; Continue manual STM/MFR for soft tissue restrictions, continue stretching and continue prone quad stretch with rope, continue BLE and functional strengthening; postural strengthening, 3D thoracic excursions for mobility    PT Home Exercise Plan  eval: SKTC, seated figure 4 stretch; 3/15: prone quad stretch, sidelying ITB stretch    Consulted and Agree with Plan of Care  Patient        Patient will benefit from skilled therapeutic intervention in order to improve the following deficits and impairments:  Decreased balance, Decreased strength, Hypomobility, Increased fascial restricitons, Increased muscle spasms, Impaired flexibility, Improper body mechanics, Postural dysfunction, Pain  Visit Diagnosis: Pain in right leg  Chronic bilateral low back pain without sciatica  Muscle weakness (generalized)  Other symptoms and signs involving the musculoskeletal system  Pain in left leg     Problem List Patient Active Problem List   Diagnosis Date Noted  . Encounter for well woman exam with routine gynecological exam 01/07/2017  . Irregular heart beat 01/07/2017  . 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 Stress headaches 06/23/2015  . Excessive daytime sleepiness 06/23/2015  . Morbid obesity due to excess calories ([place]) 06/23/2015  . Change in bowel habits   . Heme + stool   . Heme positive stool 12/20/2014  . Bowel habit changes 12/20/2014  . Dysesthesia of multiple sites 08/29/2013  . Migraine without aura and without status migrainosus, not intractable 08/01/2013  . Irregular bleeding 01/01/2013  . Endometrial polyp 11/09/2012  . Menorrhagia 10/17/2012  . Upper abdominal pain 10/06/2011  . GERD (gastroesophageal reflux disease) 08/21/2010  . Abdominal pain 08/21/2010  . Cervical lymphadenopathy 08/21/2010  . Localized swelling, mass, or lump of upper extremity 08/21/2010  . Low back pain radiating to both legs 08/19/2010  . Neck pain, chronic 08/19/2010  . Numbness and tingling in hands 08/19/2010  . PREMATURE VENTRICULAR CONTRACTIONS 07/17/2008  . PALPITATIONS 07/17/2008  . DYSPNEA 07/17/2008  . CHEST PAIN-UNSPECIFIED 07/17/2008  .  OBESITY 09/21/2007  . COLITIS 09/21/2007  . CONSTIPATION 09/21/2007  . IBS 09/21/2007  . NAUSEA 09/21/2007  . Diarrhea 09/21/2007  . Abdominal pain 09/21/2007      Geraldine Solar PT, DPT  [facility] [address] [place], [place], [address] Phone: [phone]   Fax:  [phone]  Name: Meghan [name] MRN: [id number] Date of Birth: 08/06/1970

## 2017-04-01 NOTE — Patient Instructions (Signed)
  Prone Quad Stretch  Lie down flat on your stomach. Wrap a strap (belt, towel, dog leash) around the top of one of your feet and pull the strap across your opposite shoulder so that your knee starts to curl up to your body. Pull until a stretch is felt across the front of your thigh.     Perform with other stretches, 3-5 stretches holding for 30-60 seconds each  SIDELYING - STRETCH - ILIOTIBIAL BAND - ITB  Start by lying on your side with your back near the edge of your bed or table. Your affected leg should be on top.  Next, let the top leg lower behind you as you maintain an extended knee as shown. You should feel a gentle stretch along the side of your leg.  Perform with other stretches, 3-5 stretches holding for 30-60 seconds each

## 2017-04-05 ENCOUNTER — Encounter (HOSPITAL_COMMUNITY): Payer: Self-pay

## 2017-04-05 ENCOUNTER — Ambulatory Visit (HOSPITAL_COMMUNITY): Payer: Federal, State, Local not specified - PPO

## 2017-04-05 DIAGNOSIS — R29898 Other symptoms and signs involving the musculoskeletal system: Secondary | ICD-10-CM | POA: Diagnosis not present

## 2017-04-05 DIAGNOSIS — M6281 Muscle weakness (generalized): Secondary | ICD-10-CM | POA: Diagnosis not present

## 2017-04-05 DIAGNOSIS — G8929 Other chronic pain: Secondary | ICD-10-CM | POA: Diagnosis not present

## 2017-04-05 DIAGNOSIS — M545 Low back pain: Secondary | ICD-10-CM

## 2017-04-05 DIAGNOSIS — M79604 Pain in right leg: Secondary | ICD-10-CM

## 2017-04-05 DIAGNOSIS — M79605 Pain in left leg: Secondary | ICD-10-CM

## 2017-04-05 NOTE — Therapy (Signed)
[place], [place], [address] Phone: [phone]   Fax:  [phone]  Physical Therapy Treatment  Patient Details  Name: Meghan [name] MRN: [id number] Date of Birth: June 28, 1970 Referring Provider: Sanjuana Kava, MD   Encounter Date: 04/05/2017  PT End of Session - 04/05/17 1743    Visit Number  4    Number of Visits  9    Date for PT Re-Evaluation  04/15/17    Authorization Type  [employer]/Federal EMP PPO    Authorization Time Period  03/18/17 to 04/15/17    PT Start Time  1736    PT Stop Time  1820    PT Time Calculation (min)  44 min    Activity Tolerance  Patient tolerated treatment well    Behavior During Therapy  [facility] for tasks assessed/performed       Past Medical History:  Diagnosis Date  . Abdominal pain    RLQ  . Chest pain, unspecified   . Colitis    see psh for colonoscopy reports  . Dyspnea   . Endometrial polyp 11/09/2012  . Enlarged lymph node    right side - arm  . Family history of ovarian cancer    MGM  . Generalized headaches   . Hemorrhoids   . Hiatal hernia   . IBS (irritable bowel syndrome)   . IBS (irritable bowel syndrome)   . Irregular bleeding 01/01/2013  . Menorrhagia 10/17/2012  . Nausea   . Nephrolithiasis   . Obesity, unspecified   . Palpitations   . PVC (premature ventricular contraction)     Past Surgical History:  Procedure Laterality Date  . ABLATION     Uterine  . COLONOSCOPY  2005   Dr. Irving Shows. Colitis of cecum and ICV, bx nonspecific  . COLONOSCOPY N/A 02/10/2015   Procedure: COLONOSCOPY;  Surgeon: Daneil Dolin, MD;  Location: [facility];  Service: Endoscopy;  Laterality: N/A;  0730-moved to 915 Office to notify  . colonoscopy with ileoscopy  2008   Dr. Laural Golden. patch of coarse mucosa at TI, bx  neg.  Random colon bx showed minimal active focal colitis ?resolving infection. Felt not c/w IBD.  Marland Kitchen DILITATION & CURRETTAGE/HYSTROSCOPY WITH THERMACHOICE  ABLATION N/A 12/01/2012   Procedure: DILATATION & CURETTAGE;REMOVAL OF ENDOMETRIAL POLYP; HYSTEROSCOPY WITH THERMACHOICE ENDOMETRIAL ABLATION;  Surgeon: Jonnie Kind, MD;  Location: [facility];  Service: Gynecology;  Laterality: N/A;  . ESOPHAGOGASTRODUODENOSCOPY  02/2008   Dr. Gala Romney- birnak appearing tubular esophagus. small hiatal hernia, o/w normal stomach, D1, D2  . KIDNEY STONE SURGERY    . KNEE ARTHROSCOPY  2008   ACL repair  . LAPAROSCOPIC BILATERAL SALPINGECTOMY Bilateral 12/01/2012   Procedure: LAPAROSCOPIC BILATERAL SALPINGECTOMY ;  Surgeon: Jonnie Kind, MD;  Location: [facility];  Service: Gynecology;  Laterality: Bilateral;  . LESION REMOVAL Left 12/01/2012   Procedure: SKIN TAG REMOVAL (Left inner thigh) ;  Surgeon: Jonnie Kind, MD;  Location: [facility];  Service: Gynecology;  Laterality: Left;    There were no vitals filed for this visit.  Subjective Assessment - 04/05/17 1737    Subjective  Pt stated she had good relief following last session, pain returned in couple of days.  Current pain scale 6/10 sharp, dull achey constant pain.  Reports relief without weight bearing    Currently in Pain?  Yes    Pain Score  6     Pain Location  Hip  Pain Orientation  Left    Pain Descriptors / Indicators  Aching;Dull;Sharp    Pain Type  Chronic pain    Pain Onset  More than a month ago    Pain Frequency  Intermittent    Aggravating Factors   sitting, long sitting    Pain Relieving Factors  walking    Effect of Pain on Daily Activities  slight increase                      [facility] Adult PT Treatment/Exercise - 04/05/17 0001      Lumbar Exercises: Standing   Functional Squats  10 reps    Other Standing Lumbar Exercises  fwd step ups on 6" step x10 reps each    Other Standing Lumbar Exercises  3D hip excursion 10 (limited lumbar extension for pain control0      Lumbar Exercises: Seated   Other Seated Lumbar Exercises  3D thoracic exchange      Lumbar Exercises:  Supine   Single Leg Bridge  10 reps;2 seconds BLE      Lumbar Exercises: Sidelying   Hip Abduction  Both;15 reps      Manual Therapy   Manual Therapy  Myofascial release    Manual therapy comments  Manual complete separate than rest of tx    Myofascial Release  Lt glut med pressure point             PT Education - 04/05/17 1832    Education provided  Yes    Education Details  Instructed tennis ball self-mobs for pain control    Person(s) Educated  Patient    Methods  Explanation;Demonstration    Comprehension  Verbalized understanding       PT Short Term Goals - 03/18/17 1049      PT SHORT TERM GOAL #1   Title  Pt will be independent with HEP and perform consistently in order to decrease pain and promote self-management at home.    Time  2    Period  Weeks    Status  New    Target Date  04/01/17      PT SHORT TERM GOAL #2   Title  Pt will be able to perform bil SLS for 30 sec or > with no evidence of unsteadiness and no Trendelenberg sign to demo improved functional gluteal strength and further maximize gait.    Time  2    Period  Weeks    Status  New        PT Long Term Goals - 03/18/17 1051      PT LONG TERM GOAL #1   Title  Pt will have improved proximal hip strength by at least 1/2 grade bilaterally in order to decrease pain and maximize overall function.    Time  4    Period  Weeks    Status  New    Target Date  04/15/17      PT LONG TERM GOAL #2   Title  Pt will report minimal to no tenderness to palpation of bil glutes, piriformis, ITB, and lateral VLO to demonstrate improved soft tissue restrictions and flexibility in order to decrease pt's overall c/o pain.    Time  4    Period  Weeks    Status  New      PT LONG TERM GOAL #3   Title  Pt will report being able to sit in long sitting position for at least 30 mins  or > with 2/10 BLE pain or < in order to maximize pt's ability to study at home comfortably.    Time  4    Period  Weeks    Status   New      PT LONG TERM GOAL #4   Title  Pt will report being able to load/unload her dishwasher and perform other household chores without pain to demo improved functional strength and maximize function at home.    Time  4    Period  Weeks    Status  New            Plan - 04/05/17 1824    Clinical Impression Statement  Continued with established POC to improve LE flexibilty and spinal mobility.  Added 3D thoracic and hip excursions to improve spinal/hip mobility for pain control.  Continues with gluteal strenghtening as well.  EOS wiht manual to address Lt glut med spasm for pain control and mobility with reports of relief following.  Pt educated on self-mods with tennis ball for pain control.      Rehab Potential  Good    PT Frequency  2x / week    PT Duration  4 weeks    PT Treatment/Interventions  ADLs/Self Care Home Management;Cryotherapy;Electrical Stimulation;Moist Heat;Traction;Ultrasound;Gait training;Stair training;Functional mobility training;Therapeutic activities;Therapeutic exercise;Balance training;Neuromuscular re-education;Patient/family education;Manual techniques;Passive range of motion;Dry needling;Taping    PT Next Visit Plan  potentially dry needling for glutes; Continue manual STM/MFR for soft tissue restrictions, continue stretching and continue prone quad stretch with rope, continue BLE and functional strengthening; postural strengthening.  Next session add theraband with on dynamic surfaces for postural strengthening with core activation and progress gluteal strengthening as able.      PT Home Exercise Plan  eval: SKTC, seated figure 4 stretch; 3/15: prone quad stretch, sidelying ITB stretch; 3/19: instructed tennis ball self-mobs       Patient will benefit from skilled therapeutic intervention in order to improve the following deficits and impairments:  Decreased balance, Decreased strength, Hypomobility, Increased fascial restricitons, Increased muscle spasms,  Impaired flexibility, Improper body mechanics, Postural dysfunction, Pain  Visit Diagnosis: Pain in right leg  Chronic bilateral low back pain without sciatica  Muscle weakness (generalized)  Other symptoms and signs involving the musculoskeletal system  Pain in left leg     Problem List Patient Active Problem List   Diagnosis Date Noted  . Encounter for well woman exam with routine gynecological exam 01/07/2017  . Irregular heart beat 01/07/2017  . 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 Stress headaches 06/23/2015  . Excessive daytime sleepiness 06/23/2015  . Morbid obesity due to excess calories ([place]) 06/23/2015  . Change in bowel habits   . Heme + stool   . Heme positive stool 12/20/2014  . Bowel habit changes 12/20/2014  . Dysesthesia of multiple sites 08/29/2013  . Migraine without aura and without status migrainosus, not intractable 08/01/2013  . Irregular bleeding 01/01/2013  . Endometrial polyp 11/09/2012  . Menorrhagia 10/17/2012  . Upper abdominal pain 10/06/2011  . GERD (gastroesophageal reflux disease) 08/21/2010  . Abdominal pain 08/21/2010  . Cervical lymphadenopathy 08/21/2010  . Localized swelling, mass, or lump of upper extremity 08/21/2010  . Low back pain radiating to both legs 08/19/2010  . Neck pain, chronic 08/19/2010  . Numbness and tingling in hands 08/19/2010  . PREMATURE VENTRICULAR CONTRACTIONS 07/17/2008  . PALPITATIONS 07/17/2008  . DYSPNEA 07/17/2008  . CHEST PAIN-UNSPECIFIED 07/17/2008  . OBESITY 09/21/2007  . COLITIS 09/21/2007  . CONSTIPATION 09/21/2007  .  IBS 09/21/2007  . NAUSEA 09/21/2007  . Diarrhea 09/21/2007  . Abdominal pain 09/21/2007   Ihor Austin, LPTA; CBIS [phone]  Aldona Lento 04/05/2017, 6:34 PM  [facility] [address] [place], [place], [address] Phone: [phone]   Fax:  [phone]  Name: MARYBELLE GIRALDO MRN: [id number] Date of Birth:  [birth year]

## 2017-04-07 ENCOUNTER — Telehealth (HOSPITAL_COMMUNITY): Payer: Self-pay | Admitting: Internal Medicine

## 2017-04-07 NOTE — Telephone Encounter (Signed)
04/07/17  Pt called to change the 3/22 appt.  Was changed to 3/26

## 2017-04-08 ENCOUNTER — Encounter (HOSPITAL_COMMUNITY): Payer: Federal, State, Local not specified - PPO

## 2017-04-11 ENCOUNTER — Ambulatory Visit (INDEPENDENT_AMBULATORY_CARE_PROVIDER_SITE_OTHER): Payer: Federal, State, Local not specified - PPO | Admitting: Family Medicine

## 2017-04-11 ENCOUNTER — Encounter (INDEPENDENT_AMBULATORY_CARE_PROVIDER_SITE_OTHER): Payer: Self-pay | Admitting: Family Medicine

## 2017-04-11 VITALS — BP 118/80 | HR 78 | Temp 98.0°F | Ht 68.0 in | Wt 279.0 lb

## 2017-04-11 DIAGNOSIS — G43809 Other migraine, not intractable, without status migrainosus: Secondary | ICD-10-CM

## 2017-04-11 DIAGNOSIS — Z9189 Other specified personal risk factors, not elsewhere classified: Secondary | ICD-10-CM

## 2017-04-11 DIAGNOSIS — R0602 Shortness of breath: Secondary | ICD-10-CM

## 2017-04-11 DIAGNOSIS — E559 Vitamin D deficiency, unspecified: Secondary | ICD-10-CM | POA: Diagnosis not present

## 2017-04-11 DIAGNOSIS — Z6841 Body Mass Index (BMI) 40.0 and over, adult: Secondary | ICD-10-CM | POA: Diagnosis not present

## 2017-04-11 DIAGNOSIS — R5383 Other fatigue: Secondary | ICD-10-CM | POA: Diagnosis not present

## 2017-04-11 DIAGNOSIS — Z1331 Encounter for screening for depression: Secondary | ICD-10-CM

## 2017-04-11 DIAGNOSIS — Z0289 Encounter for other administrative examinations: Secondary | ICD-10-CM

## 2017-04-11 NOTE — Progress Notes (Signed)
.  Office: [phone]  /  Fax: [phone]   HPI:   Chief Complaint: OBESITY  Meghan [name] (MR# [id number]) is a 47 y.o. female who presents on 04/11/2017 for obesity evaluation and treatment. Current BMI is Body mass index is 42.42 kg/m.Marland Kitchen Meghan [name] has struggled with obesity for years and has been unsuccessful in either losing weight or maintaining long term weight loss. Meghan [name] was told about our clinic by a coworker. Meghan [name] attended our information session and states she is currently in the action stage of change and ready to dedicate time achieving and maintaining a healthier weight.  Meghan [name] states her family eats meals together she thinks her family will eat healthier with  her her desired weight loss is 80 lbs she started gaining weight in her senior year at 47 years of age her heaviest weight ever was 280 lbs. she is a picky eater and doesn't like to eat healthier foods  she has significant food cravings issues  she snacks frequently in the evenings she skips meals frequently she frequently makes poor food choices she struggles with emotional eating    Fatigue Meghan [name] feels her energy is lower than it should be. This has worsened with weight gain and has not worsened recently. Meghan [name] admits to daytime somnolence and admits to waking up still tired. Patient is at risk for obstructive sleep apnea. Patent has a history of symptoms of daytime fatigue and morning fatigue. Patient generally gets 5 or 6 hours of sleep per night, and states they generally have restless sleep. Snoring is not present. Apneic episodes is not present. Epworth Sleepiness Score is 13  EKG was ordered today and shows poor R wave progression. She recently saw cardiology for PVC work up.  Dyspnea on exertion Meghan [name] notes increasing shortness of breath with exercising and seems to be worsening over time with weight gain. She notes getting out of breath sooner with activity than she used to. This has  not gotten worse recently. EKG was ordered today and shows poor R wave progression. She recently saw cardiology for PVC work up. Meghan [name] denies orthopnea.  Vitamin D deficiency Meghan [name] has a diagnosis of vitamin D deficiency. She is currently taking OTC vit D and denies nausea, vomiting or muscle weakness.  At risk for osteopenia and osteoporosis Meghan [name] is at higher risk of osteopenia and osteoporosis due to vitamin D deficiency.   Migraines Meghan [name] has a diagnosis of migraine headaches and is followed by neurology. She is currently on nortriptyline and xanax.  Depression Screen Meghan [name]'s Food and Mood (modified PHQ-9) score was  Depression screen PHQ 2/9 04/11/2017  Decreased Interest 1  Down, Depressed, Hopeless 1  PHQ - 2 Score 2  Altered sleeping 2  Tired, decreased energy 3  Change in appetite 2  Feeling bad or failure about yourself  0  Trouble concentrating 2  Moving slowly or fidgety/restless 1  Suicidal thoughts 0  PHQ-9 Score 12  Difficult doing work/chores Somewhat difficult    ALLERGIES: Allergies  Allergen Reactions  . Zanaflex [Tizanidine Hcl] Hives  . Morphine Other (See Comments)    Neck pain  . Oxycodone-Acetaminophen Hives and Itching    Itching    MEDICATIONS: Current Outpatient Medications on File Prior to Visit  Medication Sig Dispense Refill  . ALPRAZolam (XANAX) 0.5 MG tablet Take 0.5 mg by mouth at bedtime as needed. Sleep.    . Biotin 1 MG CAPS Take 1 capsule by mouth as needed (when remembers).     . cholecalciferol (  VITAMIN D) 1000 units tablet Take 1,000 Units by mouth daily.    . cyclobenzaprine (FLEXERIL) 10 MG tablet Take 1 tablet (10 mg total) by mouth at bedtime. One tablet every night at bedtime as needed for spasm. 30 tablet 0  . nortriptyline (PAMELOR) 10 MG capsule Take 2 capsules (20 mg total) by mouth at bedtime. 180 capsule 3   No current facility-administered medications on file prior to visit.     PAST MEDICAL  HISTORY: Past Medical History:  Diagnosis Date  . Abdominal pain    RLQ  . Back pain   . Chest pain, unspecified   . Colitis    see psh for colonoscopy reports  . Constipation   . Dyspnea   . Endometrial polyp 11/09/2012  . Enlarged lymph node    right side - arm  . Family history of ovarian cancer    MGM  . Generalized headaches   . GERD (gastroesophageal reflux disease)   . Hemorrhoids   . Hiatal hernia   . Hip pain   . IBS (irritable bowel syndrome)   . IBS (irritable bowel syndrome)   . Insomnia   . Irregular bleeding 01/01/2013  . Menorrhagia 10/17/2012  . Migraines   . Nausea   . Nephrolithiasis   . Obesity, unspecified   . Palpitations   . PVC (premature ventricular contraction)   . Vitamin D deficiency     PAST SURGICAL HISTORY: Past Surgical History:  Procedure Laterality Date  . ABLATION     Uterine  . COLONOSCOPY  2005   Dr. Irving Shows. Colitis of cecum and ICV, bx nonspecific  . COLONOSCOPY N/A 02/10/2015   Procedure: COLONOSCOPY;  Surgeon: Daneil Dolin, MD;  Location: [facility];  Service: Endoscopy;  Laterality: N/A;  0730-moved to 915 Office to notify  . colonoscopy with ileoscopy  2008   Dr. Laural Golden. patch of coarse mucosa at TI, bx  neg.  Random colon bx showed minimal active focal colitis ?resolving infection. Felt not c/w IBD.  Marland Kitchen DILITATION & CURRETTAGE/HYSTROSCOPY WITH THERMACHOICE ABLATION N/A 12/01/2012   Procedure: DILATATION & CURETTAGE;REMOVAL OF ENDOMETRIAL POLYP; HYSTEROSCOPY WITH THERMACHOICE ENDOMETRIAL ABLATION;  Surgeon: Jonnie Kind, MD;  Location: [facility];  Service: Gynecology;  Laterality: N/A;  . ESOPHAGOGASTRODUODENOSCOPY  02/2008   Dr. Gala Romney- birnak appearing tubular esophagus. small hiatal hernia, o/w normal stomach, D1, D2  . KIDNEY STONE SURGERY    . KNEE ARTHROSCOPY  2008   ACL repair  . LAPAROSCOPIC BILATERAL SALPINGECTOMY Bilateral 12/01/2012   Procedure: LAPAROSCOPIC BILATERAL SALPINGECTOMY ;  Surgeon: Jonnie Kind, MD;  Location: [facility];  Service: Gynecology;  Laterality: Bilateral;  . LESION REMOVAL Left 12/01/2012   Procedure: SKIN TAG REMOVAL (Left inner thigh) ;  Surgeon: Jonnie Kind, MD;  Location: [facility];  Service: Gynecology;  Laterality: Left;    SOCIAL HISTORY: Social History   Tobacco Use  . Smoking status: Never Smoker  . Smokeless tobacco: Never Used  Substance Use Topics  . Alcohol use: Yes    Alcohol/week: 0.0 oz    Comment: occ  . Drug use: No    FAMILY HISTORY: Family History  Problem Relation Age of Onset  . Cancer Maternal Grandmother        ovarian cancer  . Diabetes Maternal Grandfather   . Scleroderma Mother   . Sleep apnea Mother   . Obesity Mother   . Hypertension Father   . Cancer Father  liver  . Cirrhosis Father        non-alcoholic  . Obesity Father   . Stroke Paternal Grandmother   . Hypertension Paternal Grandmother   . Other Sister        Transverse myelitis  . Colon cancer Neg Hx     ROS: Review of Systems  Constitutional: Positive for malaise/fatigue.  HENT: Positive for tinnitus.        Dryness   Eyes:       Wear Glasses or Contacts  Respiratory: Positive for shortness of breath (with activity).   Cardiovascular: Negative for orthopnea.       Very Cold Feet or Hands  Gastrointestinal: Positive for constipation. Negative for nausea and vomiting.  Musculoskeletal: Positive for back pain.       Muscle Stiffness Muscle or Joint Pain Negative for muscle weakness  Neurological: Positive for headaches.  Endo/Heme/Allergies: Positive for polydipsia.       Heat/Cold Intolerance  Psychiatric/Behavioral: The patient has insomnia.        Stress     PHYSICAL EXAM: Blood pressure 118/80, pulse 78, temperature 98 F (36.7 C), temperature source Oral, height 5\' 8"  (1.727 m), weight 279 lb (126.6 kg), SpO2 98 %. Body mass index is 42.42 kg/m. Physical Exam  Constitutional: She is oriented to person, place, and time. She  appears well-developed and well-nourished.  HENT:  Head: Normocephalic and atraumatic.  Nose: Nose normal.  Eyes: EOM are normal. No scleral icterus.  Neck: Normal range of motion. Neck supple. No thyromegaly present.  Cardiovascular: Normal rate and regular rhythm.  Pulmonary/Chest: Effort normal. No respiratory distress.  Abdominal: Soft. There is no tenderness.  + obesity  Musculoskeletal: Normal range of motion.  Range of Motion normal in all 4 extremities  Neurological: She is alert and oriented to person, place, and time. Coordination normal.  Skin: Skin is warm and dry.  Psychiatric: She has a normal mood and affect. Her behavior is normal.  Vitals reviewed.   RECENT LABS AND TESTS: BMET    Component Value Date/Time   NA 137 09/14/2016 1243   K 3.9 09/14/2016 1243   CL 106 09/14/2016 1243   CO2 26 09/14/2016 1243   GLUCOSE 87 09/14/2016 1243   BUN 16 09/14/2016 1243   CREATININE 0.61 09/14/2016 1243   CALCIUM 8.7 (L) 09/14/2016 1243   GFRNONAA >60 09/14/2016 1243   GFRAA >60 09/14/2016 1243   Lab Results  Component Value Date   HGBA1C 5.3 10/07/2014   No results found for: INSULIN CBC    Component Value Date/Time   WBC 6.5 09/14/2016 1243   RBC 4.77 09/14/2016 1243   HGB 13.5 09/14/2016 1243   HCT 40.8 09/14/2016 1243   PLT 223 09/14/2016 1243   MCV 85.5 09/14/2016 1243   MCH 28.3 09/14/2016 1243   MCHC 33.1 09/14/2016 1243   RDW 12.8 09/14/2016 1243   LYMPHSABS 2.3 10/06/2015 0134   MONOABS 0.7 10/06/2015 0134   EOSABS 0.1 10/06/2015 0134   BASOSABS 0.0 10/06/2015 0134   Iron/TIBC/Ferritin/ %Sat No results found for: IRON, TIBC, FERRITIN, IRONPCTSAT Lipid Panel     Component Value Date/Time   CHOL 163 09/14/2016 1243   TRIG 51 09/14/2016 1243   HDL 56 09/14/2016 1243   CHOLHDL 2.9 09/14/2016 1243   VLDL 10 09/14/2016 1243   LDLCALC 97 09/14/2016 1243   Hepatic Function Panel     Component Value Date/Time   PROT 7.1 09/14/2016 1243    ALBUMIN 4.1 09/14/2016  1243   AST 30 09/14/2016 1243   ALT 14 09/14/2016 1243   ALKPHOS 52 09/14/2016 1243   BILITOT 0.5 09/14/2016 1243   BILIDIR 0.1 10/06/2015 0134   IBILI 0.2 (L) 10/06/2015 0134   No results found for: TSH Vitamin D There are no recent lab results  ECG  shows NSR with a rate of 80 BPM INDIRECT CALORIMETER done today shows a VO2 of 329 and a REE of 2290. Her calculated basal metabolic rate is 3154 thus her basal metabolic rate is better than expected.    ASSESSMENT AND PLAN: Other fatigue - Plan: EKG 12-Lead, Comprehensive metabolic panel, CBC With Differential, Hemoglobin A1c, Insulin, random, Lipid Panel With LDL/HDL Ratio, Vitamin B12, Folate, T3, T4, free, TSH  Shortness of breath on exertion  Vitamin D deficiency - Plan: VITAMIN D 25 Hydroxy (Vit-D Deficiency, Fractures)  Other migraine without status migrainosus, not intractable  Depression screening  At risk for osteopenia  Class 3 severe obesity with serious comorbidity and body mass index (BMI) of 40.0 to 44.9 in adult, unspecified obesity type (HCC)  PLAN:  Fatigue Mikel was informed that her fatigue may be related to obesity, depression or many other causes. Labs will be ordered, and in the meanwhile Minsa has agreed to work on diet, exercise and weight loss to help with fatigue. Proper sleep hygiene was discussed including the need for 7-8 hours of quality sleep each night. A sleep study was not ordered based on symptoms and Epworth score. We will order indirect calorimetry.  Dyspnea on exertion Gerica's shortness of breath appears to be obesity related and exercise induced. She has agreed to work on weight loss and gradually increase exercise to treat her exercise induced shortness of breath. If Alayah follows our instructions and loses weight without improvement of her shortness of breath, we will plan to refer to pulmonology.  We will order indirect calorimetry and labs. We will  monitor this condition regularly. Eunie agrees to this plan.  Vitamin D Deficiency Lise was informed that low vitamin D levels contributes to fatigue and are associated with obesity, breast, and colon cancer. She agrees to continue to take OTC Vit D @1 ,000 IU daily. We will check vitamin D level and she will follow up for routine testing of vitamin D, at least 2-3 times per year. She was informed of the risk of over-replacement of vitamin D and agrees to not increase her dose unless she discusses this with [place] first. Cassidy agrees to follow up with ou clinic in 2 weeks.  At risk for osteopenia and osteoporosis Delories is at risk for osteopenia and osteoporsis due to her vitamin D deficiency. She was encouraged to take her vitamin D and follow her higher calcium diet and increase strengthening exercise to help strengthen her bones and decrease her risk of osteopenia and osteoporosis.  Migraines Zarea will continue medications as prescribed and follow up with our clinic in 2 weeks.  Depression Screen Nakea had a moderately positive depression screening. Depression is commonly associated with obesity and often results in emotional eating behaviors. We will monitor this closely and work on CBT to help improve the non-hunger eating patterns. Referral to Psychology may be required if no improvement is seen as she continues in our clinic.  Obesity Keenya is currently in the action stage of change and her goal is to maintain weight for now She has agreed to follow the Category 4 plan Jasmane has been instructed to work up to a goal of  150 minutes of combined cardio and strengthening exercise per week for weight loss and overall health benefits. We discussed the following Behavioral Modification Strategies today: increasing lean protein intake, increasing vegetables, work on meal planning and easy cooking plans and travel eating strategies   Kersten has agreed to follow up with our  clinic in 2 weeks. She was informed of the importance of frequent follow up visits to maximize her success with intensive lifestyle modifications for her multiple health conditions. She was informed we would discuss her lab results at her next visit unless there is a critical issue that needs to be addressed sooner. Ilyssa agreed to keep her next visit at the agreed upon time to discuss these results.    OBESITY BEHAVIORAL INTERVENTION VISIT  Today's visit was # 1 out of 22.  Starting weight: 279 lbs Starting date: 04/11/17 Today's weight : 279 lbs  Today's date: 04/11/2017 Total lbs lost to date: 0 (Patients must lose 7 lbs in the first 6 months to continue with counseling)   ASK: We discussed the diagnosis of obesity with Sammuel Bailiff today and Alivea agreed to give [place] permission to discuss obesity behavioral modification therapy today.  ASSESS: Jency has the diagnosis of obesity and her BMI today is 42.43 Cindy is in the action stage of change   ADVISE: Rubye was educated on the multiple health risks of obesity as well as the benefit of weight loss to improve her health. She was advised of the need for long term treatment and the importance of lifestyle modifications.  AGREE: Multiple dietary modification options and treatment options were discussed and  Kurt agreed to the above obesity treatment plan.   I, Doreene Nest, am acting as transcriptionist for Eber Jones, MD    I have reviewed the above documentation for accuracy and completeness, and I agree with the above. - Ilene Qua, MD

## 2017-04-12 ENCOUNTER — Ambulatory Visit (HOSPITAL_COMMUNITY): Payer: Federal, State, Local not specified - PPO

## 2017-04-12 ENCOUNTER — Other Ambulatory Visit: Payer: Self-pay

## 2017-04-12 ENCOUNTER — Encounter (HOSPITAL_COMMUNITY): Payer: Self-pay

## 2017-04-12 DIAGNOSIS — M6281 Muscle weakness (generalized): Secondary | ICD-10-CM | POA: Diagnosis not present

## 2017-04-12 DIAGNOSIS — G8929 Other chronic pain: Secondary | ICD-10-CM | POA: Diagnosis not present

## 2017-04-12 DIAGNOSIS — M545 Low back pain, unspecified: Secondary | ICD-10-CM

## 2017-04-12 DIAGNOSIS — R29898 Other symptoms and signs involving the musculoskeletal system: Secondary | ICD-10-CM | POA: Diagnosis not present

## 2017-04-12 DIAGNOSIS — M79604 Pain in right leg: Secondary | ICD-10-CM

## 2017-04-12 DIAGNOSIS — M79605 Pain in left leg: Secondary | ICD-10-CM | POA: Diagnosis not present

## 2017-04-12 LAB — HEMOGLOBIN A1C
ESTIMATED AVERAGE GLUCOSE: 100 mg/dL
Hgb A1c MFr Bld: 5.1 % (ref 4.8–5.6)

## 2017-04-12 LAB — CBC WITH DIFFERENTIAL
BASOS: 1 %
Basophils Absolute: 0 10*3/uL (ref 0.0–0.2)
EOS (ABSOLUTE): 0.1 10*3/uL (ref 0.0–0.4)
Eos: 1 %
HEMOGLOBIN: 13.7 g/dL (ref 11.1–15.9)
Hematocrit: 40.3 % (ref 34.0–46.6)
IMMATURE GRANULOCYTES: 0 %
Immature Grans (Abs): 0 10*3/uL (ref 0.0–0.1)
LYMPHS ABS: 1.9 10*3/uL (ref 0.7–3.1)
Lymphs: 29 %
MCH: 28.1 pg (ref 26.6–33.0)
MCHC: 34 g/dL (ref 31.5–35.7)
MCV: 83 fL (ref 79–97)
MONOCYTES: 9 %
Monocytes Absolute: 0.6 10*3/uL (ref 0.1–0.9)
Neutrophils Absolute: 3.9 10*3/uL (ref 1.4–7.0)
Neutrophils: 60 %
RBC: 4.88 x10E6/uL (ref 3.77–5.28)
RDW: 13.9 % (ref 12.3–15.4)
WBC: 6.4 10*3/uL (ref 3.4–10.8)

## 2017-04-12 LAB — COMPREHENSIVE METABOLIC PANEL
ALBUMIN: 3.9 g/dL (ref 3.5–5.5)
ALT: 15 IU/L (ref 0–32)
AST: 34 IU/L (ref 0–40)
Albumin/Globulin Ratio: 1.5 (ref 1.2–2.2)
Alkaline Phosphatase: 63 IU/L (ref 39–117)
BILIRUBIN TOTAL: 0.2 mg/dL (ref 0.0–1.2)
BUN / CREAT RATIO: 22 (ref 9–23)
BUN: 15 mg/dL (ref 6–24)
CALCIUM: 9.1 mg/dL (ref 8.7–10.2)
CHLORIDE: 104 mmol/L (ref 96–106)
CO2: 22 mmol/L (ref 20–29)
CREATININE: 0.67 mg/dL (ref 0.57–1.00)
GFR, EST AFRICAN AMERICAN: 124 mL/min/{1.73_m2} (ref >59)
GFR, EST NON AFRICAN AMERICAN: 107 mL/min/{1.73_m2} (ref >59)
GLUCOSE: 82 mg/dL (ref 65–99)
Globulin, Total: 2.6 g/dL (ref 1.5–4.5)
Potassium: 4.7 mmol/L (ref 3.5–5.2)
Sodium: 139 mmol/L (ref 134–144)
TOTAL PROTEIN: 6.5 g/dL (ref 6.0–8.5)

## 2017-04-12 LAB — FOLATE: Folate: 6.3 ng/mL (ref >3.0)

## 2017-04-12 LAB — LIPID PANEL WITH LDL/HDL RATIO
Cholesterol, Total: 167 mg/dL (ref 100–199)
HDL: 55 mg/dL (ref >39)
LDL Calculated: 100 mg/dL — ABNORMAL HIGH (ref 0–99)
LDl/HDL Ratio: 1.8 ratio (ref 0.0–3.2)
TRIGLYCERIDES: 60 mg/dL (ref 0–149)
VLDL CHOLESTEROL CAL: 12 mg/dL (ref 5–40)

## 2017-04-12 LAB — INSULIN, RANDOM: INSULIN: 7.2 u[IU]/mL (ref 2.6–24.9)

## 2017-04-12 LAB — T4, FREE: FREE T4: 1.25 ng/dL (ref 0.82–1.77)

## 2017-04-12 LAB — T3: T3 TOTAL: 100 ng/dL (ref 71–180)

## 2017-04-12 LAB — VITAMIN B12: Vitamin B-12: <150 pg/mL — ABNORMAL LOW (ref 232–1245)

## 2017-04-12 LAB — VITAMIN D 25 HYDROXY (VIT D DEFICIENCY, FRACTURES): Vit D, 25-Hydroxy: 35.2 ng/mL (ref 30.0–100.0)

## 2017-04-12 LAB — TSH: TSH: 1.92 u[IU]/mL (ref 0.450–4.500)

## 2017-04-12 NOTE — Therapy (Signed)
[place], [place], [address] Phone: [phone]   Fax:  [phone]  Physical Therapy Treatment  Patient Details  Name: Meghan [name] MRN: [id number] Date of Birth: 1970/08/09 Referring Provider: Sanjuana Kava, MD   Encounter Date: 04/12/2017  PT End of Session - 04/12/17 0916    Visit Number  5    Number of Visits  9    Date for PT Re-Evaluation  04/15/17    Authorization Type  BCBS/Federal EMP PPO    Authorization Time Period  03/18/17 to 04/15/17    PT Start Time  0902    PT Stop Time  0944    PT Time Calculation (min)  42 min    Activity Tolerance  Patient tolerated treatment well    Behavior During Therapy  [facility] for tasks assessed/performed       Past Medical History:  Diagnosis Date  . Abdominal pain    RLQ  . Back pain   . Chest pain, unspecified   . Colitis    see psh for colonoscopy reports  . Constipation   . Dyspnea   . Endometrial polyp 11/09/2012  . Enlarged lymph node    right side - arm  . Family history of ovarian cancer    MGM  . Generalized headaches   . GERD (gastroesophageal reflux disease)   . Hemorrhoids   . Hiatal hernia   . Hip pain   . IBS (irritable bowel syndrome)   . IBS (irritable bowel syndrome)   . Insomnia   . Irregular bleeding 01/01/2013  . Menorrhagia 10/17/2012  . Migraines   . Nausea   . Nephrolithiasis   . Obesity, unspecified   . Palpitations   . PVC (premature ventricular contraction)   . Vitamin D deficiency     Past Surgical History:  Procedure Laterality Date  . ABLATION     Uterine  . COLONOSCOPY  2005   Dr. Irving Shows. Colitis of cecum and ICV, bx nonspecific  . COLONOSCOPY N/A 02/10/2015   Procedure: COLONOSCOPY;  Surgeon: Daneil Dolin, MD;  [facility];  Service: Endoscopy;  Laterality: N/A;  0730-moved to 915 Office to notify  . colonoscopy with ileoscopy  2008   Dr. Laural Golden. patch of coarse mucosa at TI, bx  neg.   Random colon bx showed minimal active focal colitis ?resolving infection. Felt not c/w IBD.  Marland Kitchen DILITATION & CURRETTAGE/HYSTROSCOPY WITH THERMACHOICE ABLATION N/A 12/01/2012   Procedure: DILATATION & CURETTAGE;REMOVAL OF ENDOMETRIAL POLYP; HYSTEROSCOPY WITH THERMACHOICE ENDOMETRIAL ABLATION;  Surgeon: Jonnie Kind, MD;  Location: [facility] ORS;  Service: Gynecology;  Laterality: N/A;  . ESOPHAGOGASTRODUODENOSCOPY  02/2008   Dr. Gala Romney- birnak appearing tubular esophagus. small hiatal hernia, o/w normal stomach, D1, D2  . KIDNEY STONE SURGERY    . KNEE ARTHROSCOPY  2008   ACL repair  . LAPAROSCOPIC BILATERAL SALPINGECTOMY Bilateral 12/01/2012   Procedure: LAPAROSCOPIC BILATERAL SALPINGECTOMY ;  Surgeon: Jonnie Kind, MD;  Location: [facility] ORS;  Service: Gynecology;  Laterality: Bilateral;  . LESION REMOVAL Left 12/01/2012   Procedure: SKIN TAG REMOVAL (Left inner thigh) ;  Surgeon: Jonnie Kind, MD;  Location: [facility] ORS;  Service: Gynecology;  Laterality: Left;    There were no vitals filed for this visit.  Subjective Assessment - 04/12/17 0909    Subjective  Patient denies pain today. She reports she has not atempted the self mobilizationw ith tennis ball for glut/hip muscles. She has been  keeping up with her HEP regularly and denies difficulty with exercises.    Currently in Pain?  No/denies         No data recorded    [facility] Adult PT Treatment/Exercise - 04/12/17 0001      Lumbar Exercises: Stretches   Active Hamstring Stretch  Right;Left;30 seconds;3 reps    Active Hamstring Stretch Limitations  supine with rope    Quad Stretch  Right;Left;3 reps;30 seconds    Quad Stretch Limitations  prone with rope      Lumbar Exercises: Standing   Functional Squats  15 reps;Limitations limited by knees    Functional Squats Limitations  2 sets; chair taps, with 2" foam on chair    Other Standing Lumbar Exercises  fwd step up, 2x 15 reps Bil LE, 6" step    Other Standing Lumbar Exercises  2x 15  reps; tandem standing on foam, forward punches with core activation, 3 second holds, (green TB)      Lumbar Exercises: Supine   Bridge  15 reps;3 seconds;Limitations    Bridge Limitations  2 sets    Single Leg Bridge  --    Bridge with [employer] Limitations  --    Other Supine Lumbar Exercises  hamstring isometric on swiss ball, 15 reps on Rt LE, 8 second holds      Lumbar Exercises: Sidelying   Hip Abduction  Both;15 reps;Limitations    Hip Abduction Limitations  2 sets      Manual Therapy   Manual Therapy  Myofascial release    Manual therapy comments  Manual complete separate than rest of tx    Myofascial Release  Rt/Lt gluteus maximus IASTM with massage stick/ball, more time spent on Rt with deep pressure for pin point soreness         PT Education - 04/12/17 0915    Education provided  Yes    Education Details  Educated on exercises throughout session, educated on proper stretching time/technique based on current evidence.     Person(s) Educated  Patient    Methods  Explanation    Comprehension  Verbalized understanding;Returned demonstration       PT Short Term Goals - 03/18/17 1049      PT SHORT TERM GOAL #1   Title  Pt will be independent with HEP and perform consistently in order to decrease pain and promote self-management at home.    Time  2    Period  Weeks    Status  New    Target Date  04/01/17      PT SHORT TERM GOAL #2   Title  Pt will be able to perform bil SLS for 30 sec or > with no evidence of unsteadiness and no Trendelenberg sign to demo improved functional gluteal strength and further maximize gait.    Time  2    Period  Weeks    Status  New        PT Long Term Goals - 03/18/17 1051      PT LONG TERM GOAL #1   Title  Pt will have improved proximal hip strength by at least 1/2 grade bilaterally in order to decrease pain and maximize overall function.    Time  4    Period  Weeks    Status  New    Target Date  04/15/17      PT LONG TERM  GOAL #2   Title  Pt will report minimal to no tenderness to palpation  of bil glutes, piriformis, ITB, and lateral VLO to demonstrate improved soft tissue restrictions and flexibility in order to decrease pt's overall c/o pain.    Time  4    Period  Weeks    Status  New      PT LONG TERM GOAL #3   Title  Pt will report being able to sit in long sitting position for at least 30 mins or > with 2/10 BLE pain or < in order to maximize pt's ability to study at home comfortably.    Time  4    Period  Weeks    Status  New      PT LONG TERM GOAL #4   Title  Pt will report being able to load/unload her dishwasher and perform other household chores without pain to demo improved functional strength and maximize function at home.    Time  4    Period  Weeks    Status  New        Plan - 04/12/17 6834    Clinical Impression Statement  This session continued with established POC focusing on Bil LE strength and flexibility and spinal mobility. Progressed core activation and hip stabilization exercises with tandem balance on foam and forward punches with theraband. Patient reported some hip soreness following this activity. Manual therapy performed at EOS to address Rt/Lt gluteus maximus trigger points. Hamstring isometric added to determine response for pain relief with sciatic nerve referral. Patient reported no pain at end of session.     Rehab Potential  Good    PT Frequency  2x / week    PT Duration  4 weeks    PT Treatment/Interventions  ADLs/Self Care Home Management;Cryotherapy;Electrical Stimulation;Moist Heat;Traction;Ultrasound;Gait training;Stair training;Functional mobility training;Therapeutic activities;Therapeutic exercise;Balance training;Neuromuscular re-education;Patient/family education;Manual techniques;Passive range of motion;Dry needling;Taping    PT Next Visit Plan  potentially dry needling for glutes; Continue manual STM/MFR for soft tissue restrictions, continue stretching and  continue prone quad stretch with rope, continue BLE and functional strengthening; postural strengthening.  Next session add theraband with on dynamic surfaces for postural strengthening with core activation and progress gluteal strengthening as able.  Progress functional step up with contralateral knee drive for balance and glut med activation.     PT Home Exercise Plan  eval: SKTC, seated figure 4 stretch; 3/15: prone quad stretch, sidelying ITB stretch; 3/19: instructed tennis ball self-mobs    Consulted and Agree with Plan of Care  Patient       Patient will benefit from skilled therapeutic intervention in order to improve the following deficits and impairments:  Decreased balance, Decreased strength, Hypomobility, Increased fascial restricitons, Increased muscle spasms, Impaired flexibility, Improper body mechanics, Postural dysfunction, Pain  Visit Diagnosis: Pain in right leg  Chronic bilateral low back pain without sciatica  Muscle weakness (generalized)  Other symptoms and signs involving the musculoskeletal system  Pain in left leg     Problem List Patient Active Problem List   Diagnosis Date Noted  . Encounter for well woman exam with routine gynecological exam 01/07/2017  . Irregular heart beat 01/07/2017  . 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 Stress headaches 06/23/2015  . Excessive daytime sleepiness 06/23/2015  . Morbid obesity due to excess calories ([place]) 06/23/2015  . Change in bowel habits   . Heme + stool   . Heme positive stool 12/20/2014  . Bowel habit changes 12/20/2014  . Dysesthesia of multiple sites 08/29/2013  . Migraine without aura and without status migrainosus, not intractable 08/01/2013  .  Irregular bleeding 01/01/2013  . Endometrial polyp 11/09/2012  . Menorrhagia 10/17/2012  . Upper abdominal pain 10/06/2011  . GERD (gastroesophageal reflux disease) 08/21/2010  . Abdominal pain 08/21/2010  . Cervical lymphadenopathy 08/21/2010  . Localized  swelling, mass, or lump of upper extremity 08/21/2010  . Low back pain radiating to both legs 08/19/2010  . Neck pain, chronic 08/19/2010  . Numbness and tingling in hands 08/19/2010  . PREMATURE VENTRICULAR CONTRACTIONS 07/17/2008  . PALPITATIONS 07/17/2008  . DYSPNEA 07/17/2008  . CHEST PAIN-UNSPECIFIED 07/17/2008  . OBESITY 09/21/2007  . COLITIS 09/21/2007  . CONSTIPATION 09/21/2007  . IBS 09/21/2007  . NAUSEA 09/21/2007  . Diarrhea 09/21/2007  . Abdominal pain 09/21/2007    Kipp Brood, PT, DPT Physical Therapist with [facility]  04/12/2017 9:59 AM    [place] [address] [place], [place], [address] Phone: [phone]   Fax:  [phone]  Name: KENYAH LUBA MRN: [id number] Date of Birth: 12-20-1970

## 2017-04-14 ENCOUNTER — Encounter: Payer: Self-pay | Admitting: Orthopedic Surgery

## 2017-04-15 ENCOUNTER — Ambulatory Visit (HOSPITAL_COMMUNITY): Payer: Federal, State, Local not specified - PPO

## 2017-04-15 ENCOUNTER — Ambulatory Visit: Payer: Federal, State, Local not specified - PPO | Admitting: Orthopedic Surgery

## 2017-04-15 ENCOUNTER — Encounter (HOSPITAL_COMMUNITY): Payer: Self-pay

## 2017-04-15 DIAGNOSIS — M79604 Pain in right leg: Secondary | ICD-10-CM

## 2017-04-15 DIAGNOSIS — G8929 Other chronic pain: Secondary | ICD-10-CM | POA: Diagnosis not present

## 2017-04-15 DIAGNOSIS — M6281 Muscle weakness (generalized): Secondary | ICD-10-CM | POA: Diagnosis not present

## 2017-04-15 DIAGNOSIS — M79605 Pain in left leg: Secondary | ICD-10-CM | POA: Diagnosis not present

## 2017-04-15 DIAGNOSIS — M545 Low back pain, unspecified: Secondary | ICD-10-CM

## 2017-04-15 DIAGNOSIS — R29898 Other symptoms and signs involving the musculoskeletal system: Secondary | ICD-10-CM | POA: Diagnosis not present

## 2017-04-15 NOTE — Patient Instructions (Signed)
  Setup  Begin lying on your back with one leg bent and your other leg straight. Movement  Pull your bent knee toward your opposite shoulder and hold. You should feel a stretch on the outside of your buttocks. Tip  Make sure to keep your back flat against the bed during the stretch.   Setup  Begin lying on your back with your arms resting at your sides, your legs bent at the knees and your feet flat on the ground. Movement  Tighten your abdominals and slowly lift your hips off the floor into a bridge position, keeping your back straight. Tip  Make sure to keep your trunk stiff throughout the exercise and your arms flat on the floor.  Setup  Begin lying on your back with your knees bent and feet hip width apart. Movement  Squeeze your stomach and buttock muscles and lift your bottom up into a bridge position. Keeping your bridge position, straighten your knee until it is fully extended. Return your foot to the starting position and repeat with the opposite leg. Tip  Make sure to hold your bridge position and do not let your hip drop as you straighten your leg.  Setup  Begin in a standing upright position in front of a wall with your feet slightly wider than shoulder width apart. Movement  Lean back into a squat against the wall with your knees bent to 90 degrees, and hold this position. Tip  Make sure your knees are not bent forward past your toes and keep your back flat against the wall during the exercise.   Setup  Begin in a standing upright position with a resistance band looped around your ankles. Movement  Slowly step sideways, maintaining tension in the band. Repeat in the opposite direction. Tip  Make sure to keep your feet pointing straight forward and do not lean your torso to either side as you step.   Setup  Begin lying on your side with your bottom knee bent. Movement  Lift your top leg forward, then extend it up and backward. Lower it down and forward, then  repeat. Tip  Make sure to keep your abdominals tight, and do not let your hips rotate forward or backward during the exercise.   Setup  Begin in a standing upright position with your arms resting at your sides. Movement  Lift one foot off the ground. Hold this position. Tip  Make sure to maintain your balance and keep your back straight during the exercise.    Setup  Begin standing upright on one leg on the edge of a foam pad. Movement  Pull your toes up toward your body and kick your leg straight forward, then lower it back to the starting position. Repeat the motion sideways and backward.  Tip  Make sure to keep your knee facing forward and do not lean your torso forward, sideways, or backward during the exercise.

## 2017-04-15 NOTE — Therapy (Signed)
[place], [place], [address] Phone: [phone]   Fax:  [phone]  Physical Therapy Treatment  Patient Details  Name: Meghan [name] MRN: [id number] Date of Birth: 08/29/1970 Referring Provider: Sanjuana Kava, MD   Encounter Date: 04/15/2017  PT End of Session - 04/15/17 0816    Visit Number  6    Number of Visits  9    Date for PT Re-Evaluation  05/13/17    Authorization Type  BCBS/Federal EMP PPO    Authorization Time Period  03/18/17 to 04/15/17; NEW: 04/15/17 to 05/13/17    PT Start Time  0815    PT Stop Time  0845    PT Time Calculation (min)  30 min    Activity Tolerance  Patient tolerated treatment well    Behavior During Therapy  [facility] for tasks assessed/performed       Past Medical History:  Diagnosis Date  . Abdominal pain    RLQ  . Back pain   . Chest pain, unspecified   . Colitis    see psh for colonoscopy reports  . Constipation   . Dyspnea   . Endometrial polyp 11/09/2012  . Enlarged lymph node    right side - arm  . Family history of ovarian cancer    MGM  . Generalized headaches   . GERD (gastroesophageal reflux disease)   . Hemorrhoids   . Hiatal hernia   . Hip pain   . IBS (irritable bowel syndrome)   . IBS (irritable bowel syndrome)   . Insomnia   . Irregular bleeding 01/01/2013  . Menorrhagia 10/17/2012  . Migraines   . Nausea   . Nephrolithiasis   . Obesity, unspecified   . Palpitations   . PVC (premature ventricular contraction)   . Vitamin D deficiency     Past Surgical History:  Procedure Laterality Date  . ABLATION     Uterine  . COLONOSCOPY  2005   Dr. Irving Shows. Colitis of cecum and ICV, bx nonspecific  . COLONOSCOPY N/A 02/10/2015   Procedure: COLONOSCOPY;  Surgeon: Daneil Dolin, MD;  [facility];  Service: Endoscopy;  Laterality: N/A;  0730-moved to 915 Office to notify  . colonoscopy with ileoscopy  2008   Dr. Laural Golden. patch of coarse  mucosa at TI, bx  neg.  Random colon bx showed minimal active focal colitis ?resolving infection. Felt not c/w IBD.  Marland Kitchen DILITATION & CURRETTAGE/HYSTROSCOPY WITH THERMACHOICE ABLATION N/A 12/01/2012   Procedure: DILATATION & CURETTAGE;REMOVAL OF ENDOMETRIAL POLYP; HYSTEROSCOPY WITH THERMACHOICE ENDOMETRIAL ABLATION;  Surgeon: Jonnie Kind, MD;  Location: [facility] ORS;  Service: Gynecology;  Laterality: N/A;  . ESOPHAGOGASTRODUODENOSCOPY  02/2008   Dr. Gala Romney- birnak appearing tubular esophagus. small hiatal hernia, o/w normal stomach, D1, D2  . KIDNEY STONE SURGERY    . KNEE ARTHROSCOPY  2008   ACL repair  . LAPAROSCOPIC BILATERAL SALPINGECTOMY Bilateral 12/01/2012   Procedure: LAPAROSCOPIC BILATERAL SALPINGECTOMY ;  Surgeon: Jonnie Kind, MD;  Location: [facility];  Service: Gynecology;  Laterality: Bilateral;  . LESION REMOVAL Left 12/01/2012   Procedure: SKIN TAG REMOVAL (Left inner thigh) ;  Surgeon: Jonnie Kind, MD;  Location: [facility] ORS;  Service: Gynecology;  Laterality: Left;    There were no vitals filed for this visit.  Subjective Assessment - 04/15/17 0816    Subjective  Pt states that her legs are much better. She said that last night she was able to read  with no pain in her L leg and slight pain in her R leg.    Currently in Pain?  No/denies         [facility] PT Assessment - 04/15/17 0001      Assessment   Medical Diagnosis  Acute L sided LBP    Referring Provider  Sanjuana Kava, MD    Onset Date/Surgical Date  09/18/16    Prior Therapy  yes on knee for ACL and meniscus in 2008      Strength   Right Hip Flexion  5/5 was 4+    Right Hip Extension  4+/5 was 4    Right Hip ABduction  4+/5 was 4    Left Hip Flexion  5/5 was 4+    Left Hip Extension  4+/5 was 4    Left Hip ABduction  4+/5 was 4      Balance   Balance Assessed  Yes      Static Standing Balance   Static Standing - Balance Support  No upper extremity supported    Static Standing Balance -  Activities   Single Leg  Stance - Right Leg;Single Leg Stance - Left Leg    Static Standing - Comment/# of Minutes  R: 25 sec, L: 30            PT Education - 04/15/17 0916    Education provided  Yes    Education Details  reassessment findings, HEP POC    Person(s) Educated  Patient    Methods  Explanation;Handout    Comprehension  Verbalized understanding       PT Short Term Goals - 04/15/17 0819      PT SHORT TERM GOAL #1   Title  Pt will be independent with HEP and perform consistently in order to decrease pain and promote self-management at home.    Time  2    Period  Weeks    Status  Achieved      PT SHORT TERM GOAL #2   Title  Pt will be able to perform bil SLS for 30 sec or > with no evidence of unsteadiness and no Trendelenberg sign to demo improved functional gluteal strength and further maximize gait.    Baseline  3/29: R: 25 sec, L: 30 sec    Time  2    Period  Weeks    Status  Partially Met        PT Long Term Goals - 04/15/17 1610      PT LONG TERM GOAL #1   Title  Pt will have improved proximal hip strength by at least 1/2 grade bilaterally in order to decrease pain and maximize overall function.    Baseline  3/29: see MMT    Time  4    Period  Weeks    Status  Achieved      PT LONG TERM GOAL #2   Title  Pt will report minimal to no tenderness to palpation of bil glutes, piriformis, ITB, and lateral VLO to demonstrate improved soft tissue restrictions and flexibility in order to decrease pt's overall c/o pain.    Time  4    Period  Weeks    Status  On-going      PT LONG TERM GOAL #3   Title  Pt will report being able to sit in long sitting position for at least 30 mins or > with 2/10 BLE pain or < in order to maximize pt's ability to study at  home comfortably.    Baseline  3/39: 20 mins comfortably, pain still gets up to a 4/10 ("very tolerable")    Time  4    Period  Weeks    Status  On-going      PT LONG TERM GOAL #4   Title  Pt will report being able to load/unload  her dishwasher and perform other household chores without pain to demo improved functional strength and maximize function at home.    Time  4    Period  Weeks    Status  Achieved            Plan - 04/15/17 6067    Clinical Impression Statement  PT reassessed pt's goals and outcome measures this date. Pt has made great progress towards goals as illustrated above. Her MMT improved by 1/2 a grade, she can perform household chores without pain, and her tolerance to sitting in her position of comfort (long sitting) has improved. She states that she only had minimal pain in her RLE last night while in that position. She did state that she is in increased pain in the mornings and asked what could be done about that; PT educated her to perform her stretching first thing when she wakes up and that should address that issue and she verbalized understanding. Pt continues to be tender to palpation of bil glute, piriformis, ITB and VLO, however, she reports that those areas are only tender when PT palpating; she states that those areas do not hurt when she is WB and performing daily activities. She reports feeling 50% improved, reporting that the 50% remaining is that her pain is still present some when she sits in long sitting, however, she notes that his has significantly improved since starting therapy. Pt will be put on HEP POC to allow pt to perform self-management of symptoms and will f/u in 4 weeks. She was educated that she could cancel that appointment if she felt like she didn't need to return and will be discharged at that point and she verbalized understanding. PT provided extensive HEP updates today to continue to progress flexibility and strengthening.    Rehab Potential  Good    PT Duration  4 weeks 1 f/u visit in 4 weeks; pt on HEP POC     PT Treatment/Interventions  ADLs/Self Care Home Management;Cryotherapy;Electrical Stimulation;Moist Heat;Traction;Ultrasound;Gait training;Stair  training;Functional mobility training;Therapeutic activities;Therapeutic exercise;Balance training;Neuromuscular re-education;Patient/family education;Manual techniques;Passive range of motion;Dry needling;Taping    PT Next Visit Plan  f/u with how HEP POC went and either reassess and update POC or discharge if pt managing well; potentially dry needling for glutes; Continue manual STM/MFR for soft tissue restrictions, continue stretching and continue prone quad stretch with rope, continue BLE and functional strengthening; postural strengthening.  Next session add theraband with on dynamic surfaces for postural strengthening with core activation and progress gluteal strengthening as able.  Progress functional step up with contralateral knee drive for balance and glut med activation.     PT Home Exercise Plan  eval: SKTC, seated figure 4 stretch; 3/15: prone quad stretch, sidelying ITB stretch; 3/19: instructed tennis ball self-mobs; 3/29: supine piriformis stretch, bridging, single leg bridging, sidelying hip abd, wall squats, sidestepping with GTB, SLS, SLS with vectors foam/pillow    Consulted and Agree with Plan of Care  Patient       Patient will benefit from skilled therapeutic intervention in order to improve the following deficits and impairments:  Decreased balance, Decreased strength, Hypomobility, Increased fascial restricitons,  Increased muscle spasms, Impaired flexibility, Improper body mechanics, Postural dysfunction, Pain  Visit Diagnosis: Pain in left leg - Plan: PT plan of care cert/re-cert  Pain in right leg - Plan: PT plan of care cert/re-cert  Chronic bilateral low back pain without sciatica - Plan: PT plan of care cert/re-cert  Muscle weakness (generalized) - Plan: PT plan of care cert/re-cert  Other symptoms and signs involving the musculoskeletal system - Plan: PT plan of care cert/re-cert     Problem List Patient Active Problem List   Diagnosis Date Noted  . Encounter  for well woman exam with routine gynecological exam 01/07/2017  . Irregular heart beat 01/07/2017  . 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 Stress headaches 06/23/2015  . Excessive daytime sleepiness 06/23/2015  . Morbid obesity due to excess calories ([place]) 06/23/2015  . Change in bowel habits   . Heme + stool   . Heme positive stool 12/20/2014  . Bowel habit changes 12/20/2014  . Dysesthesia of multiple sites 08/29/2013  . Migraine without aura and without status migrainosus, not intractable 08/01/2013  . Irregular bleeding 01/01/2013  . Endometrial polyp 11/09/2012  . Menorrhagia 10/17/2012  . Upper abdominal pain 10/06/2011  . GERD (gastroesophageal reflux disease) 08/21/2010  . Abdominal pain 08/21/2010  . Cervical lymphadenopathy 08/21/2010  . Localized swelling, mass, or lump of upper extremity 08/21/2010  . Low back pain radiating to both legs 08/19/2010  . Neck pain, chronic 08/19/2010  . Numbness and tingling in hands 08/19/2010  . PREMATURE VENTRICULAR CONTRACTIONS 07/17/2008  . PALPITATIONS 07/17/2008  . DYSPNEA 07/17/2008  . CHEST PAIN-UNSPECIFIED 07/17/2008  . OBESITY 09/21/2007  . COLITIS 09/21/2007  . CONSTIPATION 09/21/2007  . IBS 09/21/2007  . NAUSEA 09/21/2007  . Diarrhea 09/21/2007  . Abdominal pain 09/21/2007         Geraldine Solar PT, DPT  [place] [address] [place], [place], [address] Phone: [phone]   Fax:  [phone]  Name: Meghan [name] MRN: [id number] Date of Birth: 1970/12/13

## 2017-04-25 ENCOUNTER — Ambulatory Visit (INDEPENDENT_AMBULATORY_CARE_PROVIDER_SITE_OTHER): Payer: Federal, State, Local not specified - PPO | Admitting: Family Medicine

## 2017-04-26 ENCOUNTER — Ambulatory Visit (INDEPENDENT_AMBULATORY_CARE_PROVIDER_SITE_OTHER): Payer: Federal, State, Local not specified - PPO | Admitting: Family Medicine

## 2017-04-27 ENCOUNTER — Ambulatory Visit (INDEPENDENT_AMBULATORY_CARE_PROVIDER_SITE_OTHER): Payer: Federal, State, Local not specified - PPO | Admitting: Family Medicine

## 2017-04-27 VITALS — BP 116/76 | HR 83 | Temp 98.4°F | Ht 68.0 in | Wt 270.0 lb

## 2017-04-27 DIAGNOSIS — Z6841 Body Mass Index (BMI) 40.0 and over, adult: Secondary | ICD-10-CM

## 2017-04-27 DIAGNOSIS — E559 Vitamin D deficiency, unspecified: Secondary | ICD-10-CM | POA: Diagnosis not present

## 2017-04-27 DIAGNOSIS — E538 Deficiency of other specified B group vitamins: Secondary | ICD-10-CM | POA: Diagnosis not present

## 2017-04-27 DIAGNOSIS — Z9189 Other specified personal risk factors, not elsewhere classified: Secondary | ICD-10-CM | POA: Diagnosis not present

## 2017-04-27 MED ORDER — VITAMIN D (ERGOCALCIFEROL) 1.25 MG (50000 UNIT) PO CAPS: 50000.0000 [IU] | ORAL_CAPSULE | ORAL | 0 refills | Status: DC

## 2017-04-28 NOTE — Progress Notes (Signed)
Office: [phone]  /  Fax: [phone]   HPI:   Chief Complaint: OBESITY Meghan [name] is here to discuss her progress with her obesity treatment plan. She is on the Category 4 plan and is following her eating plan approximately 95 % of the time. She states she is walking for 20 minutes 3 times per week. Meghan [name] is learning to plan. She only indulged one time on a cheeseburger. She has found her mood to be more angry. Her weight is 270 lb (122.5 kg) today and has had a weight loss of 9 pounds over a period of 2 weeks since her last visit. She has lost 9 lbs since starting treatment with [place].  Vitamin D deficiency Meghan [name] has a diagnosis of vitamin D deficiency. She is currently taking OTC vit D 1,000 IU daily and denies nausea, vomiting or muscle weakness.  At risk for osteopenia and osteoporosis Meghan [name] is at higher risk of osteopenia and osteoporosis due to vitamin D deficiency.   Vitamin B12 Deficiency Meghan [name] has a diagnosis of B12 insufficiency based on her Vit B12 level <150 and she notes fatigue. This is a new diagnosis. Meghan [name] is not a vegetarian and does not have a previous diagnosis of pernicious anemia. She does not have a history of weight loss surgery.   ALLERGIES: Allergies  Allergen Reactions  . Zanaflex [Tizanidine Hcl] Hives  . Morphine Other (See Comments)    Neck pain  . Oxycodone-Acetaminophen Hives and Itching    Itching    MEDICATIONS: Current Outpatient Medications on File Prior to Visit  Medication Sig Dispense Refill  . ALPRAZolam (XANAX) 0.5 MG tablet Take 0.5 mg by mouth at bedtime as needed. Sleep.    . Biotin 1 MG CAPS Take 1 capsule by mouth as needed (when remembers).     . cholecalciferol (VITAMIN D) 1000 units tablet Take 1,000 Units by mouth daily.    . cyanocobalamin 2000 MCG tablet Take 2,000 mcg by mouth daily.    . cyclobenzaprine (FLEXERIL) 10 MG tablet Take 1 tablet (10 mg total) by mouth at bedtime. One tablet every night at bedtime  as needed for spasm. 30 tablet 0  . nortriptyline (PAMELOR) 10 MG capsule Take 2 capsules (20 mg total) by mouth at bedtime. 180 capsule 3   No current facility-administered medications on file prior to visit.     PAST MEDICAL HISTORY: Past Medical History:  Diagnosis Date  . Abdominal pain    RLQ  . Back pain   . Chest pain, unspecified   . Colitis    see psh for colonoscopy reports  . Constipation   . Dyspnea   . Endometrial polyp 11/09/2012  . Enlarged lymph node    right side - arm  . Family history of ovarian cancer    MGM  . Generalized headaches   . GERD (gastroesophageal reflux disease)   . Hemorrhoids   . Hiatal hernia   . Hip pain   . IBS (irritable bowel syndrome)   . IBS (irritable bowel syndrome)   . Insomnia   . Irregular bleeding 01/01/2013  . Menorrhagia 10/17/2012  . Migraines   . Nausea   . Nephrolithiasis   . Obesity, unspecified   . Palpitations   . PVC (premature ventricular contraction)   . Vitamin D deficiency     PAST SURGICAL HISTORY: Past Surgical History:  Procedure Laterality Date  . ABLATION     Uterine  . COLONOSCOPY  2005   Dr. Irving Shows. Colitis of  cecum and ICV, bx nonspecific  . COLONOSCOPY N/A 02/10/2015   Procedure: COLONOSCOPY;  Surgeon: Daneil Dolin, MD;  Location: [facility];  Service: Endoscopy;  Laterality: N/A;  0730-moved to 915 Office to notify  . colonoscopy with ileoscopy  2008   Dr. Laural Golden. patch of coarse mucosa at TI, bx  neg.  Random colon bx showed minimal active focal colitis ?resolving infection. Felt not c/w IBD.  Marland Kitchen DILITATION & CURRETTAGE/HYSTROSCOPY WITH THERMACHOICE ABLATION N/A 12/01/2012   Procedure: DILATATION & CURETTAGE;REMOVAL OF ENDOMETRIAL POLYP; HYSTEROSCOPY WITH THERMACHOICE ENDOMETRIAL ABLATION;  Surgeon: Jonnie Kind, MD;  Location: [facility];  Service: Gynecology;  Laterality: N/A;  . ESOPHAGOGASTRODUODENOSCOPY  02/2008   Dr. Gala Romney- birnak appearing tubular esophagus. small hiatal hernia,  o/w normal stomach, D1, D2  . KIDNEY STONE SURGERY    . KNEE ARTHROSCOPY  2008   ACL repair  . LAPAROSCOPIC BILATERAL SALPINGECTOMY Bilateral 12/01/2012   Procedure: LAPAROSCOPIC BILATERAL SALPINGECTOMY ;  Surgeon: Jonnie Kind, MD;  Location: [facility];  Service: Gynecology;  Laterality: Bilateral;  . LESION REMOVAL Left 12/01/2012   Procedure: SKIN TAG REMOVAL (Left inner thigh) ;  Surgeon: Jonnie Kind, MD;  Location: [facility];  Service: Gynecology;  Laterality: Left;    SOCIAL HISTORY: Social History   Tobacco Use  . Smoking status: Never Smoker  . Smokeless tobacco: Never Used  Substance Use Topics  . Alcohol use: Yes    Alcohol/week: 0.0 oz    Comment: occ  . Drug use: No    FAMILY HISTORY: Family History  Problem Relation Age of Onset  . Cancer Maternal Grandmother        ovarian cancer  . Diabetes Maternal Grandfather   . Scleroderma Mother   . Sleep apnea Mother   . Obesity Mother   . Hypertension Father   . Cancer Father        liver  . Cirrhosis Father        non-alcoholic  . Obesity Father   . Stroke Paternal Grandmother   . Hypertension Paternal Grandmother   . Other Sister        Transverse myelitis  . Colon cancer Neg Hx     ROS: Review of Systems  Constitutional: Positive for malaise/fatigue and weight loss.  Gastrointestinal: Negative for nausea and vomiting.  Musculoskeletal:       Negative for muscle weakness  Psychiatric/Behavioral:       Positive for anger    PHYSICAL EXAM: Blood pressure 116/76, pulse 83, temperature 98.4 F (36.9 C), temperature source Oral, height 5\' 8"  (1.727 m), weight 270 lb (122.5 kg), SpO2 96 %. Body mass index is 41.05 kg/m. Physical Exam  Constitutional: She is oriented to person, place, and time. She appears well-developed and well-nourished.  Cardiovascular: Normal rate.  Pulmonary/Chest: Effort normal.  Musculoskeletal: Normal range of motion.  Neurological: She is oriented to person, place, and time.   Skin: Skin is warm and dry.  Psychiatric: She has a normal mood and affect. Her behavior is normal.  Vitals reviewed.   RECENT LABS AND TESTS: BMET    Component Value Date/Time   NA 139 04/11/2017 1158   K 4.7 04/11/2017 1158   CL 104 04/11/2017 1158   CO2 22 04/11/2017 1158   GLUCOSE 82 04/11/2017 1158   GLUCOSE 87 09/14/2016 1243   BUN 15 04/11/2017 1158   CREATININE 0.67 04/11/2017 1158   CALCIUM 9.1 04/11/2017 1158   GFRNONAA 107 04/11/2017 1158   GFRAA  124 04/11/2017 1158   Lab Results  Component Value Date   HGBA1C 5.1 04/11/2017   HGBA1C 5.3 10/07/2014   Lab Results  Component Value Date   INSULIN 7.2 04/11/2017   CBC    Component Value Date/Time   WBC 6.4 04/11/2017 1158   WBC 6.5 09/14/2016 1243   RBC 4.88 04/11/2017 1158   RBC 4.77 09/14/2016 1243   HGB 13.7 04/11/2017 1158   HCT 40.3 04/11/2017 1158   PLT 223 09/14/2016 1243   MCV 83 04/11/2017 1158   MCH 28.1 04/11/2017 1158   MCH 28.3 09/14/2016 1243   MCHC 34.0 04/11/2017 1158   MCHC 33.1 09/14/2016 1243   RDW 13.9 04/11/2017 1158   LYMPHSABS 1.9 04/11/2017 1158   MONOABS 0.7 10/06/2015 0134   EOSABS 0.1 04/11/2017 1158   BASOSABS 0.0 04/11/2017 1158   Iron/TIBC/Ferritin/ %Sat No results found for: IRON, TIBC, FERRITIN, IRONPCTSAT Lipid Panel     Component Value Date/Time   CHOL 167 04/11/2017 1158   TRIG 60 04/11/2017 1158   HDL 55 04/11/2017 1158   CHOLHDL 2.9 09/14/2016 1243   VLDL 10 09/14/2016 1243   LDLCALC 100 (H) 04/11/2017 1158   Hepatic Function Panel     Component Value Date/Time   PROT 6.5 04/11/2017 1158   ALBUMIN 3.9 04/11/2017 1158   AST 34 04/11/2017 1158   ALT 15 04/11/2017 1158   ALKPHOS 63 04/11/2017 1158   BILITOT 0.2 04/11/2017 1158   BILIDIR 0.1 10/06/2015 0134   IBILI 0.2 (L) 10/06/2015 0134      Component Value Date/Time   TSH 1.920 04/11/2017 1158   Results for MOLLEY, HOUSER (MRN [id number]) as of 04/28/2017 16:09  Ref. Range 04/11/2017 11:58    Vitamin D, 25-Hydroxy Latest Ref Range: 30.0 - 100.0 ng/mL 35.2   ASSESSMENT AND PLAN: Vitamin B 12 deficiency  Vitamin D deficiency - Plan: Vitamin D, Ergocalciferol, (DRISDOL) 50000 units CAPS capsule  At risk for osteopenia  Class 3 severe obesity with serious comorbidity and body mass index (BMI) of 40.0 to 44.9 in adult, unspecified obesity type ([place])  PLAN:  Vitamin D Deficiency Meghan [name] was informed that low vitamin D levels contributes to fatigue and are associated with obesity, breast, and colon cancer. She agrees to start to take prescription Vit D @50 ,000 IU every week #4 with no refills and will follow up for routine testing of vitamin D, at least 2-3 times per year. She was informed of the risk of over-replacement of vitamin D and agrees to not increase her dose unless she discusses this with [place] first. Meghan [name] agrees to follow up with our clinic in 2 weeks.  At risk for osteopenia and osteoporosis Meghan [name] is at risk for osteopenia and osteoporosis due to her vitamin D deficiency. She was encouraged to take her vitamin D and follow her higher calcium diet and increase strengthening exercise to help strengthen her bones and decrease her risk of osteopenia and osteoporosis.  Vitamin B12 Deficiency Meghan [name] will work on increasing B12 rich foods in her diet. She agrees to start vitamin B12 2,000 mg daily and follow up at the agreed upon time.  Obesity Meghan [name] is currently in the action stage of change. As such, her goal is to continue with weight loss efforts She has agreed to follow the Category 4 plan Meghan [name] has been instructed to work up to a goal of 150 minutes of combined cardio and strengthening exercise per week for weight loss and overall health benefits. We  discussed the following Behavioral Modification Strategies today: increase H2O intake, better snacking choices, planning for success, increasing lean protein intake and work on meal planning and easy cooking  plans  Meghan [name] has agreed to follow up with our clinic in 2 weeks. She was informed of the importance of frequent follow up visits to maximize her success with intensive lifestyle modifications for her multiple health conditions.   OBESITY BEHAVIORAL INTERVENTION VISIT  Today's visit was # 2 out of 22.  Starting weight: 279 lbs Starting date: 04/11/17 Today's weight : 270 lbs  Today's date: 04/27/2017 Total lbs lost to date: 9 (Patients must lose 7 lbs in the first 6 months to continue with counseling)   ASK: We discussed the diagnosis of obesity with Meghan [name] today and Meghan [name] agreed to give [place] permission to discuss obesity behavioral modification therapy today.  ASSESS: Meghan [name] has the diagnosis of obesity and her BMI today is 41.06 Aniyha is in the action stage of change   ADVISE: Meghan [name] was educated on the multiple health risks of obesity as well as the benefit of weight loss to improve her health. She was advised of the need for long term treatment and the importance of lifestyle modifications.  AGREE: Multiple dietary modification options and treatment options were discussed and  Meghan [name] agreed to the above obesity treatment plan.  I, Doreene Nest, am acting as transcriptionist for Eber Jones, MD  I have reviewed the above documentation for accuracy and completeness, and I agree with the above. - Ilene Qua, MD

## 2017-05-10 DIAGNOSIS — K080 Exfoliation of teeth due to systemic causes: Secondary | ICD-10-CM | POA: Diagnosis not present

## 2017-05-11 ENCOUNTER — Emergency Department (HOSPITAL_COMMUNITY): Payer: Federal, State, Local not specified - PPO

## 2017-05-11 ENCOUNTER — Ambulatory Visit (INDEPENDENT_AMBULATORY_CARE_PROVIDER_SITE_OTHER): Payer: Federal, State, Local not specified - PPO | Admitting: Family Medicine

## 2017-05-11 ENCOUNTER — Encounter (HOSPITAL_COMMUNITY): Payer: Self-pay

## 2017-05-11 ENCOUNTER — Encounter (INDEPENDENT_AMBULATORY_CARE_PROVIDER_SITE_OTHER): Payer: Self-pay

## 2017-05-11 ENCOUNTER — Other Ambulatory Visit: Payer: Self-pay

## 2017-05-11 ENCOUNTER — Emergency Department (HOSPITAL_COMMUNITY)
Admission: EM | Admit: 2017-05-11 | Discharge: 2017-05-11 | Disposition: A | Discharge status: Home / Self Care | Payer: Federal, State, Local not specified - PPO | Attending: Emergency Medicine | Admitting: Emergency Medicine

## 2017-05-11 DIAGNOSIS — Z79899 Other long term (current) drug therapy: Secondary | ICD-10-CM | POA: Insufficient documentation

## 2017-05-11 DIAGNOSIS — M62838 Other muscle spasm: Secondary | ICD-10-CM | POA: Diagnosis not present

## 2017-05-11 DIAGNOSIS — M62830 Muscle spasm of back: Secondary | ICD-10-CM | POA: Insufficient documentation

## 2017-05-11 DIAGNOSIS — M545 Low back pain: Secondary | ICD-10-CM | POA: Diagnosis not present

## 2017-05-11 MED ORDER — METHOCARBAMOL 500 MG PO TABS: 1000.0000 mg | ORAL_TABLET | Freq: Four times a day (QID) | ORAL | 0 refills | Status: DC

## 2017-05-11 MED ORDER — DIAZEPAM 5 MG PO TABS: 5.0000 mg | ORAL_TABLET | Freq: Three times a day (TID) | ORAL | 0 refills | Status: DC | PRN

## 2017-05-11 MED ORDER — METHOCARBAMOL 500 MG PO TABS
1000.0000 mg | ORAL_TABLET | Freq: Once | ORAL | Status: AC
  Administered 2017-05-11: 1000 mg via ORAL
  Filled 2017-05-11: qty 2

## 2017-05-11 NOTE — ED Notes (Signed)
Patient transported to X-ray 

## 2017-05-11 NOTE — Discharge Instructions (Addendum)
Your plain xray imaging is negative for acute injury.  Use the valium prescribed along with a heating pad 20 minute increments every few hours as needed for continued spasm.  You may also use your home pain medicine. Use the the other medicines as directed.   Avoid lifting,  Bending,  Twisting or any other activity that worsens your pain over the next week.   You should get rechecked if your symptoms are not better over the next 5 days, or you develop increased pain, weakness in your leg(s) or loss of bladder or bowel function - these are symptoms of a worse injury.

## 2017-05-11 NOTE — ED Triage Notes (Signed)
Pt reports was walking down the steps and started having back pain when she stepped down.

## 2017-05-11 NOTE — ED Provider Notes (Signed)
[facility] EMERGENCY DEPARTMENT Provider Note   CSN: [id number] Arrival date & time: 05/11/17  1342     History   Chief Complaint Chief Complaint  Patient presents with  . Back Pain    HPI Meghan [name] is a 47 y.o. female with a history of low back pain secondary to degenerative changes with a history of sciatica presenting with acute onset of right sided paralumbar pain which she suspects is muscle spasm, occurred while she was walking down a flight of steps here at the hospital.  She denies falling and denies radiation of pain and numbness or weakness in her extremities.  She is followed by Dr. Luna Glasgow for her low back pain and recently finished physical therapy for a spasm in her left upper buttock which is now symptom free. She has been more comfortable standing since the event, sitting and positional changes is painful.  She has had no treatment prior to arrival.  HPI  Past Medical History:  Diagnosis Date  . Abdominal pain    RLQ  . Back pain   . Chest pain, unspecified   . Colitis    see psh for colonoscopy reports  . Constipation   . Dyspnea   . Endometrial polyp 11/09/2012  . Enlarged lymph node    right side - arm  . Family history of ovarian cancer    MGM  . Generalized headaches   . GERD (gastroesophageal reflux disease)   . Hemorrhoids   . Hiatal hernia   . Hip pain   . IBS (irritable bowel syndrome)   . IBS (irritable bowel syndrome)   . Insomnia   . Irregular bleeding 01/01/2013  . Menorrhagia 10/17/2012  . Migraines   . Nausea   . Nephrolithiasis   . Obesity, unspecified   . Palpitations   . PVC (premature ventricular contraction)   . Vitamin D deficiency     Patient Active Problem List   Diagnosis Date Noted  . Encounter for well woman exam with routine gynecological exam 01/07/2017  . Irregular heart beat 01/07/2017  . 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 Stress headaches 06/23/2015  . Excessive daytime sleepiness 06/23/2015  . Morbid  obesity due to excess calories ([place]) 06/23/2015  . Change in bowel habits   . Heme + stool   . Heme positive stool 12/20/2014  . Bowel habit changes 12/20/2014  . Dysesthesia of multiple sites 08/29/2013  . Migraine without aura and without status migrainosus, not intractable 08/01/2013  . Irregular bleeding 01/01/2013  . Endometrial polyp 11/09/2012  . Menorrhagia 10/17/2012  . Upper abdominal pain 10/06/2011  . GERD (gastroesophageal reflux disease) 08/21/2010  . Abdominal pain 08/21/2010  . Cervical lymphadenopathy 08/21/2010  . Localized swelling, mass, or lump of upper extremity 08/21/2010  . Low back pain radiating to both legs 08/19/2010  . Neck pain, chronic 08/19/2010  . Numbness and tingling in hands 08/19/2010  . PREMATURE VENTRICULAR CONTRACTIONS 07/17/2008  . PALPITATIONS 07/17/2008  . DYSPNEA 07/17/2008  . CHEST PAIN-UNSPECIFIED 07/17/2008  . OBESITY 09/21/2007  . COLITIS 09/21/2007  . CONSTIPATION 09/21/2007  . IBS 09/21/2007  . NAUSEA 09/21/2007  . Diarrhea 09/21/2007  . Abdominal pain 09/21/2007    Past Surgical History:  Procedure Laterality Date  . ABLATION     Uterine  . COLONOSCOPY  2005   Dr. Irving Shows. Colitis of cecum and ICV, bx nonspecific  . COLONOSCOPY N/A 02/10/2015   Procedure: COLONOSCOPY;  Surgeon: Daneil Dolin, MD;  Location[facility];  Service: Endoscopy;  Laterality: N/A;  0730-moved to 915 Office to notify  . colonoscopy with ileoscopy  2008   Dr. Laural Golden. patch of coarse mucosa at TI, bx  neg.  Random colon bx showed minimal active focal colitis ?resolving infection. Felt not c/w IBD.  Marland Kitchen DILITATION & CURRETTAGE/HYSTROSCOPY WITH THERMACHOICE ABLATION N/A 12/01/2012   Procedure: DILATATION & CURETTAGE;REMOVAL OF ENDOMETRIAL POLYP; HYSTEROSCOPY WITH THERMACHOICE ENDOMETRIAL ABLATION;  Surgeon: Jonnie Kind, MD;  Location: [facility];  Service: Gynecology;  Laterality: N/A;  . ESOPHAGOGASTRODUODENOSCOPY  02/2008   Dr. Gala Romney- birnak  appearing tubular esophagus. small hiatal hernia, o/w normal stomach, D1, D2  . KIDNEY STONE SURGERY    . KNEE ARTHROSCOPY  2008   ACL repair  . LAPAROSCOPIC BILATERAL SALPINGECTOMY Bilateral 12/01/2012   Procedure: LAPAROSCOPIC BILATERAL SALPINGECTOMY ;  Surgeon: Jonnie Kind, MD;  Location: [facility];  Service: Gynecology;  Laterality: Bilateral;  . LESION REMOVAL Left 12/01/2012   Procedure: SKIN TAG REMOVAL (Left inner thigh) ;  Surgeon: Jonnie Kind, MD;  Location: [facility];  Service: Gynecology;  Laterality: Left;     OB History    Gravida  2   Para  2   Term  2   Preterm      AB      Living  2     SAB      TAB      Ectopic      Multiple      Live Births  2            Home Medications    Prior to Admission medications   Medication Sig Start Date End Date Taking? Authorizing Provider  ALPRAZolam Duanne Moron) 0.5 MG tablet Take 0.5 mg by mouth at bedtime as needed. Sleep. 09/24/11  Yes [provider]  Biotin 1 MG CAPS Take 1 capsule by mouth as needed (when remembers).    Yes [provider]  cyanocobalamin 2000 MCG tablet Take 2,000 mcg by mouth daily.   Yes [provider]  cyclobenzaprine (FLEXERIL) 10 MG tablet Take 1 tablet (10 mg total) by mouth at bedtime. One tablet every night at bedtime as needed for spasm. 03/08/17  Yes Sanjuana Kava, MD  nortriptyline (PAMELOR) 10 MG capsule Take 2 capsules (20 mg total) by mouth at bedtime. 02/03/17  Yes [name], NP  Vitamin D, Ergocalciferol, (DRISDOL) 50000 units CAPS capsule Take 1 capsule (50,000 Units total) by mouth every 7 (seven) days. 04/27/17  Yes Eber Jones, MD  diazepam (VALIUM) 5 MG tablet Take 1 tablet (5 mg total) by mouth every 8 (eight) hours as needed for muscle spasms. 05/11/17   Evalee Jefferson, PA-C    Family History Family History  Problem Relation Age of Onset  . Cancer Maternal Grandmother        ovarian cancer  . Diabetes Maternal Grandfather   .  Scleroderma Mother   . Sleep apnea Mother   . Obesity Mother   . Hypertension Father   . Cancer Father        liver  . Cirrhosis Father        non-alcoholic  . Obesity Father   . Stroke Paternal Grandmother   . Hypertension Paternal Grandmother   . Other Sister        Transverse myelitis  . Colon cancer Neg Hx     Social History Social History   Tobacco Use  . Smoking status: Never Smoker  . Smokeless tobacco:  Never Used  Substance Use Topics  . Alcohol use: Yes    Alcohol/week: 0.0 oz    Comment: occ  . Drug use: No     Allergies   Zanaflex [tizanidine hcl]; Morphine; and Oxycodone-acetaminophen   Review of Systems Review of Systems  Constitutional: Negative for fever.  Respiratory: Negative for shortness of breath.   Cardiovascular: Negative for chest pain and leg swelling.  Gastrointestinal: Negative for abdominal distention, abdominal pain and constipation.  Genitourinary: Negative for difficulty urinating, dysuria, flank pain, frequency and urgency.  Musculoskeletal: Positive for back pain. Negative for gait problem and joint swelling.  Skin: Negative for rash.  Neurological: Negative for weakness and numbness.     Physical Exam Updated Vital Signs BP (!) 122/111 (BP Location: Right Arm)   Pulse 82   Temp 98.5 F (36.9 C) (Oral)   Resp 16   Ht 5\' 9"  (1.753 m)   Wt 120.7 kg (266 lb)   LMP 05/03/2017   SpO2 100%   BMI 39.28 kg/m   Physical Exam  Constitutional: She appears well-developed and well-nourished.  HENT:  Head: Normocephalic.  Eyes: Conjunctivae are normal.  Neck: Normal range of motion. Neck supple.  Cardiovascular: Normal rate and intact distal pulses.  Pedal pulses normal.  Pulmonary/Chest: Effort normal.  Abdominal: Soft. Bowel sounds are normal. She exhibits no distension and no mass.  Musculoskeletal: She exhibits no edema.       Right shoulder: She exhibits decreased range of motion and spasm. She exhibits no bony tenderness,  no swelling and no crepitus.       Lumbar back: She exhibits tenderness. She exhibits no swelling, no edema and no spasm.       Arms: Neurological: She is alert. She has normal strength. She displays no atrophy and no tremor. No sensory deficit. Gait normal.  Reflex Scores:      Patellar reflexes are 1+ on the right side and 1+ on the left side. No strength deficit noted in hip and knee flexor and extensor muscle groups.  Ankle flexion and extension intact.  Trace to 1+ bilateral patellar dtr's. Pt ambulatory without foot drop.  Skin: Skin is warm and dry.  Psychiatric: She has a normal mood and affect.  Nursing note and vitals reviewed.    ED Treatments / Results  Labs (all labs ordered are listed, but only abnormal results are displayed) Labs Reviewed - No data to display  EKG None  Radiology Dg Lumbar Spine Complete  Result Date: 05/11/2017 CLINICAL DATA:  Low back pain EXAM: LUMBAR SPINE - COMPLETE 4+ VIEW COMPARISON:  03/08/2017 FINDINGS: Five lumbar type vertebral bodies are well visualized. No pars defects are noted. No significant anterolisthesis is noted. No compression deformities are seen. Mild osteophytic changes are noted. No soft tissue abnormality is seen. IMPRESSION: Mild degenerative change without acute abnormality. Electronically Signed   By: Inez Catalina M.D.   On: 05/11/2017 15:51    Procedures Procedures (including critical care time)  Medications Ordered in ED Medications  methocarbamol (ROBAXIN) tablet 1,000 mg (1,000 mg Oral Given 05/11/17 1501)     Initial Impression / Assessment and Plan / ED Course  I have reviewed the triage vital signs and the nursing notes.  Pertinent labs & imaging results that were available during my care of the patient were reviewed by me and considered in my medical decision making (see chart for details).     Pt was given robaxin prior to proceeding to xray. She had no  relief from this medicine prior to dc.  She was  prescribed valium in place of this medicine.  Discussed heat tx, activities as tolerated. Prn f/u with ortho if sx persist, return precautions outlined.     Final Clinical Impressions(s) / ED Diagnoses   Final diagnoses:  Muscle spasm of back    ED Discharge Orders        Ordered     05/11/17 1550    diazepam (VALIUM) 5 MG tablet  Every 8 hours PRN     05/11/17 1603       Evalee Jefferson, PA-C 05/11/17 1714    Milton Ferguson, MD 05/13/17 1555

## 2017-05-12 ENCOUNTER — Other Ambulatory Visit: Payer: Self-pay

## 2017-05-12 ENCOUNTER — Emergency Department (HOSPITAL_COMMUNITY)
Admission: EM | Admit: 2017-05-12 | Discharge: 2017-05-12 | Disposition: A | Discharge status: Home / Self Care | Payer: Federal, State, Local not specified - PPO | Attending: Emergency Medicine | Admitting: Emergency Medicine

## 2017-05-12 ENCOUNTER — Encounter (HOSPITAL_COMMUNITY): Payer: Self-pay

## 2017-05-12 DIAGNOSIS — S39012A Strain of muscle, fascia and tendon of lower back, initial encounter: Secondary | ICD-10-CM | POA: Diagnosis not present

## 2017-05-12 DIAGNOSIS — X58XXXD Exposure to other specified factors, subsequent encounter: Secondary | ICD-10-CM | POA: Insufficient documentation

## 2017-05-12 DIAGNOSIS — Z79899 Other long term (current) drug therapy: Secondary | ICD-10-CM | POA: Diagnosis not present

## 2017-05-12 DIAGNOSIS — S3992XD Unspecified injury of lower back, subsequent encounter: Secondary | ICD-10-CM | POA: Insufficient documentation

## 2017-05-12 MED ORDER — METHOCARBAMOL 500 MG PO TABS: 1000.0000 mg | ORAL_TABLET | Freq: Three times a day (TID) | ORAL | 0 refills | Status: DC | PRN

## 2017-05-12 MED ORDER — KETOROLAC TROMETHAMINE 60 MG/2ML IM SOLN
60.0000 mg | Freq: Once | INTRAMUSCULAR | Status: AC
  Administered 2017-05-12: 60 mg via INTRAMUSCULAR
  Filled 2017-05-12: qty 2

## 2017-05-12 MED ORDER — METHOCARBAMOL 500 MG PO TABS
1000.0000 mg | ORAL_TABLET | Freq: Once | ORAL | Status: AC
  Administered 2017-05-12: 1000 mg via ORAL
  Filled 2017-05-12: qty 2

## 2017-05-12 MED ORDER — ACETAMINOPHEN 500 MG PO TABS
1000.0000 mg | ORAL_TABLET | Freq: Once | ORAL | Status: AC
  Administered 2017-05-12: 1000 mg via ORAL
  Filled 2017-05-12: qty 2

## 2017-05-12 NOTE — ED Notes (Signed)
Pt made aware of the wait time. Pt and family understood.

## 2017-05-12 NOTE — ED Provider Notes (Signed)
[facility] EMERGENCY DEPARTMENT Provider Note   CSN: [id number] Arrival date & time: 05/12/17  6761     History   Chief Complaint Chief Complaint  Patient presents with  . Back Pain    HPI Meghan [name] is a 47 y.o. female.  HPI Patient began having right-sided low back pain yesterday while walking down some stairs.  Denies any trauma.  Denies radiation of the pain though she does have some numbness to her right leg when she sits.  Denies any focal weakness.  No urinary incontinence or other urinary symptoms.  Patient does have a history of degenerative disc disease in her back.  Was evaluated in the emergency department.  Had x-rays performed of the lumbar back without acute abnormality.  Given dose of Robaxin and discharged with prescription for Valium.  Patient states she took the Valium yesterday evening before bed and did not help with the pain.  She has not taken any medication this morning.  States she believes the low back pain is worse today than yesterday.  Has a history of previous renal stones but states this pain feels different. Past Medical History:  Diagnosis Date  . Abdominal pain    RLQ  . Back pain   . Chest pain, unspecified   . Colitis    see psh for colonoscopy reports  . Constipation   . Dyspnea   . Endometrial polyp 11/09/2012  . Enlarged lymph node    right side - arm  . Family history of ovarian cancer    MGM  . Generalized headaches   . GERD (gastroesophageal reflux disease)   . Hemorrhoids   . Hiatal hernia   . Hip pain   . IBS (irritable bowel syndrome)   . IBS (irritable bowel syndrome)   . Insomnia   . Irregular bleeding 01/01/2013  . Menorrhagia 10/17/2012  . Migraines   . Nausea   . Nephrolithiasis   . Obesity, unspecified   . Palpitations   . PVC (premature ventricular contraction)   . Vitamin D deficiency     Patient Active Problem List   Diagnosis Date Noted  . Encounter for well woman exam with routine gynecological  exam 01/07/2017  . Irregular heart beat 01/07/2017  . 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 Stress headaches 06/23/2015  . Excessive daytime sleepiness 06/23/2015  . Morbid obesity due to excess calories ([place]) 06/23/2015  . Change in bowel habits   . Heme + stool   . Heme positive stool 12/20/2014  . Bowel habit changes 12/20/2014  . Dysesthesia of multiple sites 08/29/2013  . Migraine without aura and without status migrainosus, not intractable 08/01/2013  . Irregular bleeding 01/01/2013  . Endometrial polyp 11/09/2012  . Menorrhagia 10/17/2012  . Upper abdominal pain 10/06/2011  . GERD (gastroesophageal reflux disease) 08/21/2010  . Abdominal pain 08/21/2010  . Cervical lymphadenopathy 08/21/2010  . Localized swelling, mass, or lump of upper extremity 08/21/2010  . Low back pain radiating to both legs 08/19/2010  . Neck pain, chronic 08/19/2010  . Numbness and tingling in hands 08/19/2010  . PREMATURE VENTRICULAR CONTRACTIONS 07/17/2008  . PALPITATIONS 07/17/2008  . DYSPNEA 07/17/2008  . CHEST PAIN-UNSPECIFIED 07/17/2008  . OBESITY 09/21/2007  . COLITIS 09/21/2007  . CONSTIPATION 09/21/2007  . IBS 09/21/2007  . NAUSEA 09/21/2007  . Diarrhea 09/21/2007  . Abdominal pain 09/21/2007    Past Surgical History:  Procedure Laterality Date  . ABLATION     Uterine  . COLONOSCOPY  2005  Dr. Irving Shows. Colitis of cecum and ICV, bx nonspecific  . COLONOSCOPY N/A 02/10/2015   Procedure: COLONOSCOPY;  Surgeon: Daneil Dolin, MD;  Location: [facility];  Service: Endoscopy;  Laterality: N/A;  0730-moved to 915 Office to notify  . colonoscopy with ileoscopy  2008   Dr. Laural Golden. patch of coarse mucosa at TI, bx  neg.  Random colon bx showed minimal active focal colitis ?resolving infection. Felt not c/w IBD.  Marland Kitchen DILITATION & CURRETTAGE/HYSTROSCOPY WITH THERMACHOICE ABLATION N/A 12/01/2012   Procedure: DILATATION & CURETTAGE;REMOVAL OF ENDOMETRIAL POLYP; HYSTEROSCOPY WITH THERMACHOICE  ENDOMETRIAL ABLATION;  Surgeon: Jonnie Kind, MD;  Location: [facility] ORS;  Service: Gynecology;  Laterality: N/A;  . ESOPHAGOGASTRODUODENOSCOPY  02/2008   Dr. Gala Romney- birnak appearing tubular esophagus. small hiatal hernia, o/w normal stomach, D1, D2  . KIDNEY STONE SURGERY    . KNEE ARTHROSCOPY  2008   ACL repair  . LAPAROSCOPIC BILATERAL SALPINGECTOMY Bilateral 12/01/2012   Procedure: LAPAROSCOPIC BILATERAL SALPINGECTOMY ;  Surgeon: Jonnie Kind, MD;  Location: [facility] ORS;  Service: Gynecology;  Laterality: Bilateral;  . LESION REMOVAL Left 12/01/2012   Procedure: SKIN TAG REMOVAL (Left inner thigh) ;  Surgeon: Jonnie Kind, MD;  Location: [facility] ORS;  Service: Gynecology;  Laterality: Left;     OB History    Gravida  2   Para  2   Term  2   Preterm      AB      Living  2     SAB      TAB      Ectopic      Multiple      Live Births  2            Home Medications    Prior to Admission medications   Medication Sig Start Date End Date Taking? Authorizing Provider  ALPRAZolam Duanne Moron) 0.5 MG tablet Take 0.5 mg by mouth at bedtime as needed. Sleep. 09/24/11   [provider]  Biotin 1 MG CAPS Take 1 capsule by mouth as needed (when remembers).     [provider]  cyanocobalamin 2000 MCG tablet Take 2,000 mcg by mouth daily.    [provider]  cyclobenzaprine (FLEXERIL) 10 MG tablet Take 1 tablet (10 mg total) by mouth at bedtime. One tablet every night at bedtime as needed for spasm. 03/08/17   Sanjuana Kava, MD  diazepam (VALIUM) 5 MG tablet Take 1 tablet (5 mg total) by mouth every 8 (eight) hours as needed for muscle spasms. 05/11/17   Evalee Jefferson, PA-C  methocarbamol (ROBAXIN) 500 MG tablet Take 2 tablets (1,000 mg total) by mouth every 8 (eight) hours as needed for muscle spasms. 05/12/17   Julianne Rice, MD  nortriptyline (PAMELOR) 10 MG capsule Take 2 capsules (20 mg total) by mouth at bedtime. 02/03/17   [name], NP  Vitamin D,  Ergocalciferol, (DRISDOL) 50000 units CAPS capsule Take 1 capsule (50,000 Units total) by mouth every 7 (seven) days. 04/27/17   Eber Jones, MD    Family History Family History  Problem Relation Age of Onset  . Cancer Maternal Grandmother        ovarian cancer  . Diabetes Maternal Grandfather   . Scleroderma Mother   . Sleep apnea Mother   . Obesity Mother   . Hypertension Father   . Cancer Father        liver  . Cirrhosis Father        non-alcoholic  .  Obesity Father   . Stroke Paternal Grandmother   . Hypertension Paternal Grandmother   . Other Sister        Transverse myelitis  . Colon cancer Neg Hx     Social History Social History   Tobacco Use  . Smoking status: Never Smoker  . Smokeless tobacco: Never Used  Substance Use Topics  . Alcohol use: Yes    Alcohol/week: 0.0 oz    Comment: occ  . Drug use: No     Allergies   Zanaflex [tizanidine hcl]; Morphine; and Oxycodone-acetaminophen   Review of Systems Review of Systems  Constitutional: Negative for chills and fever.  Respiratory: Negative for shortness of breath.   Cardiovascular: Negative for chest pain.  Gastrointestinal: Positive for nausea. Negative for abdominal pain and vomiting.  Genitourinary: Negative for difficulty urinating, dysuria, flank pain and hematuria.  Musculoskeletal: Positive for back pain and myalgias. Negative for neck pain and neck stiffness.  Skin: Negative for rash and wound.  Neurological: Positive for numbness. Negative for dizziness, weakness, light-headedness and headaches.  All other systems reviewed and are negative.    Physical Exam Updated Vital Signs BP 120/80 (BP Location: Right Arm)   Pulse 91   Temp 97.8 F (36.6 C) (Oral)   Resp 17   LMP 05/03/2017   SpO2 97%   Physical Exam  Constitutional: She is oriented to person, place, and time. She appears well-developed and well-nourished.  HENT:  Head: Normocephalic and atraumatic.  Eyes: Pupils are  equal, round, and reactive to light. EOM are normal.  Neck: Normal range of motion. Neck supple.  Cardiovascular: Normal rate.  Pulmonary/Chest: Effort normal.  Abdominal: Soft. Bowel sounds are normal. There is no tenderness. There is no rebound and no guarding.  Musculoskeletal: Normal range of motion. She exhibits tenderness. She exhibits no edema.  No midline thoracic or lumbar tenderness to palpation.  Tenderness to palpation along the right paraspinal musculature of the lumbar spine.  Spasm is appreciated.  Negative straight leg raise bilaterally.  No lower extremity swelling, asymmetry or tenderness.  2+ distal pulses.    Neurological: She is alert and oriented to person, place, and time.  5/5 motor in all extremities.  Sensation intact.  No saddle anesthesia.  Ambulatory without assistance.  Skin: Skin is warm and dry. Capillary refill takes less than 2 seconds. No rash noted. No erythema.  Psychiatric: She has a normal mood and affect. Her behavior is normal.  Nursing note and vitals reviewed.    ED Treatments / Results  Labs (all labs ordered are listed, but only abnormal results are displayed) Labs Reviewed - No data to display  EKG None  Radiology Dg Lumbar Spine Complete  Result Date: 05/11/2017 CLINICAL DATA:  Low back pain EXAM: LUMBAR SPINE - COMPLETE 4+ VIEW COMPARISON:  03/08/2017 FINDINGS: Five lumbar type vertebral bodies are well visualized. No pars defects are noted. No significant anterolisthesis is noted. No compression deformities are seen. Mild osteophytic changes are noted. No soft tissue abnormality is seen. IMPRESSION: Mild degenerative change without acute abnormality. Electronically Signed   By: Inez Catalina M.D.   On: 05/11/2017 15:51    Procedures Procedures (including critical care time)  Medications Ordered in ED Medications  ketorolac (TORADOL) injection 60 mg (60 mg Intramuscular Given 05/12/17 0746)  acetaminophen (TYLENOL) tablet 1,000 mg  (1,000 mg Oral Given 05/12/17 0746)  methocarbamol (ROBAXIN) tablet 1,000 mg (1,000 mg Oral Given 05/12/17 0746)     Initial Impression / Assessment and  Plan / ED Course  I have reviewed the triage vital signs and the nursing notes.  Pertinent labs & imaging results that were available during my care of the patient were reviewed by me and considered in my medical decision making (see chart for details).    No red flag signs or symptoms.  Do not believe that emergent imaging is necessary at this point. She states she is feeling much better after medication.  Continues to have normal neurologic exam and ambulates without difficulty.  Advised to follow-up with her primary physician.  Return precautions given.  Final Clinical Impressions(s) / ED Diagnoses   Final diagnoses:  Strain of lumbar region, initial encounter    ED Discharge Orders        Ordered    methocarbamol (ROBAXIN) 500 MG tablet  Every 8 hours PRN     05/12/17 0834       Julianne Rice, MD 05/12/17 [phone]

## 2017-05-12 NOTE — ED Triage Notes (Signed)
Pt reports she strained her back yesterday while walking down the steps, was seen here in the e.d. And given meds, but has not gotten any relief from same. Pt reports pain is right lower lumbar

## 2017-05-13 ENCOUNTER — Encounter (HOSPITAL_COMMUNITY): Payer: Self-pay

## 2017-05-13 ENCOUNTER — Telehealth (HOSPITAL_COMMUNITY): Payer: Self-pay

## 2017-05-13 ENCOUNTER — Ambulatory Visit (HOSPITAL_COMMUNITY): Payer: Federal, State, Local not specified - PPO

## 2017-05-13 NOTE — Therapy (Signed)
[place], [place], [address] Phone: [phone]   Fax:  [phone]  Patient Details  Name: Meghan [name] MRN: [id number] Date of Birth: 06/24/1970 Referring Provider:  No ref. provider found  Encounter Date: 05/13/2017    PHYSICAL THERAPY DISCHARGE SUMMARY  Visits from Start of Care: 6  Current functional level related to goals / functional outcomes: See last treatment note   Remaining deficits: See last treatment note   Education / Equipment: Continue HEP Plan: Patient agrees to discharge.  Patient goals were partially met. Patient is being discharged due to meeting the stated rehab goals.  ?????        Geraldine Solar PT, [place] [address] [place], [place], [address] Phone: [phone]   Fax:  [phone]

## 2017-05-13 NOTE — Telephone Encounter (Signed)
Attempted to call pt re no show for re-evaluation appointment but no answer and mailbox was full so unable to leave message.   Geraldine Solar PT, DPT

## 2017-05-13 NOTE — Telephone Encounter (Signed)
Pt called back at 8:38AM and stated that she completely forgot about this appointment and meant to cancel it. At this time, will d/c pt.   Geraldine Solar PT, DPT

## 2017-05-24 ENCOUNTER — Ambulatory Visit (INDEPENDENT_AMBULATORY_CARE_PROVIDER_SITE_OTHER): Payer: Federal, State, Local not specified - PPO | Admitting: Family Medicine

## 2017-05-24 VITALS — BP 123/81 | HR 95 | Temp 98.0°F | Ht 69.0 in | Wt 269.0 lb

## 2017-05-24 DIAGNOSIS — E559 Vitamin D deficiency, unspecified: Secondary | ICD-10-CM

## 2017-05-24 DIAGNOSIS — Z6841 Body Mass Index (BMI) 40.0 and over, adult: Secondary | ICD-10-CM

## 2017-05-24 DIAGNOSIS — E538 Deficiency of other specified B group vitamins: Secondary | ICD-10-CM

## 2017-05-24 DIAGNOSIS — Z9189 Other specified personal risk factors, not elsewhere classified: Secondary | ICD-10-CM | POA: Diagnosis not present

## 2017-05-24 MED ORDER — VITAMIN D (ERGOCALCIFEROL) 1.25 MG (50000 UNIT) PO CAPS: 50000.0000 [IU] | ORAL_CAPSULE | ORAL | 0 refills | Status: DC

## 2017-05-26 NOTE — Progress Notes (Signed)
Office: [phone]  /  Fax: [phone]   HPI:   Chief Complaint: OBESITY Meghan [name] is here to discuss her progress with her obesity treatment plan. She is on the Category 4 plan and is following her eating plan approximately 95 % of the time. She states she is exercising 0 minutes 0 times per week. Meghan [name] had fairly significant back pain for the past 2 weeks. She may not have eaten everything on the plan. Meghan [name] is still enjoying meals. Her weight is 269 lb (122 kg) today and has had a weight loss of 1 pound over a period of 4 weeks since her last visit. She has lost 10 lbs since starting treatment with [place].  Vitamin D deficiency Meghan [name] has a diagnosis of vitamin D deficiency. She is currently taking vit D and denies nausea, vomiting or muscle weakness.  At risk for osteopenia and osteoporosis Meghan [name] is at higher risk of osteopenia and osteoporosis due to vitamin D deficiency.   Vitamin B12 deficiency Meghan [name] has a diagnosis of B12 insufficiency and notes fatigue. This is not a new diagnosis. Meghan [name] is not a vegetarian and does not have a previous diagnosis of pernicious anemia. She does not have a history of weight loss surgery.   ALLERGIES: Allergies  Allergen Reactions  . Zanaflex [Tizanidine Hcl] Hives  . Morphine Other (See Comments)    Neck pain  . Oxycodone-Acetaminophen Hives and Itching    Itching    MEDICATIONS: Current Outpatient Medications on File Prior to Visit  Medication Sig Dispense Refill  . ALPRAZolam (XANAX) 0.5 MG tablet Take 0.5 mg by mouth at bedtime as needed. Sleep.    . Biotin 1 MG CAPS Take 1 capsule by mouth as needed (when remembers).     . cyanocobalamin 2000 MCG tablet Take 2,000 mcg by mouth daily.    . cyclobenzaprine (FLEXERIL) 10 MG tablet Take 1 tablet (10 mg total) by mouth at bedtime. One tablet every night at bedtime as needed for spasm. 30 tablet 0  . diazepam (VALIUM) 5 MG tablet Take 1 tablet (5 mg total) by mouth every 8  (eight) hours as needed for muscle spasms. 15 tablet 0  . methocarbamol (ROBAXIN) 500 MG tablet Take 2 tablets (1,000 mg total) by mouth every 8 (eight) hours as needed for muscle spasms. 30 tablet 0  . nortriptyline (PAMELOR) 10 MG capsule Take 2 capsules (20 mg total) by mouth at bedtime. 180 capsule 3   No current facility-administered medications on file prior to visit.     PAST MEDICAL HISTORY: Past Medical History:  Diagnosis Date  . Abdominal pain    RLQ  . Back pain   . Chest pain, unspecified   . Colitis    see psh for colonoscopy reports  . Constipation   . Dyspnea   . Endometrial polyp 11/09/2012  . Enlarged lymph node    right side - arm  . Family history of ovarian cancer    MGM  . Generalized headaches   . GERD (gastroesophageal reflux disease)   . Hemorrhoids   . Hiatal hernia   . Hip pain   . IBS (irritable bowel syndrome)   . IBS (irritable bowel syndrome)   . Insomnia   . Irregular bleeding 01/01/2013  . Menorrhagia 10/17/2012  . Migraines   . Nausea   . Nephrolithiasis   . Obesity, unspecified   . Palpitations   . PVC (premature ventricular contraction)   . Vitamin D deficiency     PAST  SURGICAL HISTORY: Past Surgical History:  Procedure Laterality Date  . ABLATION     Uterine  . COLONOSCOPY  2005   Dr. Irving Shows. Colitis of cecum and ICV, bx nonspecific  . COLONOSCOPY N/A 02/10/2015   Procedure: COLONOSCOPY;  Surgeon: Daneil Dolin, MD;  [facility];  Service: Endoscopy;  Laterality: N/A;  0730-moved to 915 Office to notify  . colonoscopy with ileoscopy  2008   Dr. Laural Golden. patch of coarse mucosa at TI, bx  neg.  Random colon bx showed minimal active focal colitis ?resolving infection. Felt not c/w IBD.  Marland Kitchen DILITATION & CURRETTAGE/HYSTROSCOPY WITH THERMACHOICE ABLATION N/A 12/01/2012   Procedure: DILATATION & CURETTAGE;REMOVAL OF ENDOMETRIAL POLYP; HYSTEROSCOPY WITH THERMACHOICE ENDOMETRIAL ABLATION;  Surgeon: Jonnie Kind, MD;   Location: [facility];  Service: Gynecology;  Laterality: N/A;  . ESOPHAGOGASTRODUODENOSCOPY  02/2008   Dr. Gala Romney- birnak appearing tubular esophagus. small hiatal hernia, o/w normal stomach, D1, D2  . KIDNEY STONE SURGERY    . KNEE ARTHROSCOPY  2008   ACL repair  . LAPAROSCOPIC BILATERAL SALPINGECTOMY Bilateral 12/01/2012   Procedure: LAPAROSCOPIC BILATERAL SALPINGECTOMY ;  Surgeon: Jonnie Kind, MD;  Location: [facility];  Service: Gynecology;  Laterality: Bilateral;  . LESION REMOVAL Left 12/01/2012   Procedure: SKIN TAG REMOVAL (Left inner thigh) ;  Surgeon: Jonnie Kind, MD;  Location: [facility];  Service: Gynecology;  Laterality: Left;    SOCIAL HISTORY: Social History   Tobacco Use  . Smoking status: Never Smoker  . Smokeless tobacco: Never Used  Substance Use Topics  . Alcohol use: Yes    Alcohol/week: 0.0 oz    Comment: occ  . Drug use: No    FAMILY HISTORY: Family History  Problem Relation Age of Onset  . Cancer Maternal Grandmother        ovarian cancer  . Diabetes Maternal Grandfather   . Scleroderma Mother   . Sleep apnea Mother   . Obesity Mother   . Hypertension Father   . Cancer Father        liver  . Cirrhosis Father        non-alcoholic  . Obesity Father   . Stroke Paternal Grandmother   . Hypertension Paternal Grandmother   . Other Sister        Transverse myelitis  . Colon cancer Neg Hx     ROS: Review of Systems  Constitutional: Positive for weight loss.  Gastrointestinal: Negative for nausea and vomiting.  Musculoskeletal:       Negative for muscle weakness    PHYSICAL EXAM: Blood pressure 123/81, pulse 95, temperature 98 F (36.7 C), temperature source Oral, height 5\' 9"  (1.753 m), weight 269 lb (122 kg), last menstrual period 05/03/2017, SpO2 100 %. Body mass index is 39.72 kg/m. Physical Exam  Constitutional: She is oriented to person, place, and time. She appears well-developed and well-nourished.  Cardiovascular: Normal rate.    Pulmonary/Chest: Effort normal.  Musculoskeletal: Normal range of motion.  Neurological: She is oriented to person, place, and time.  Skin: Skin is warm and dry.  Psychiatric: She has a normal mood and affect. Her behavior is normal.  Vitals reviewed.   RECENT LABS AND TESTS: BMET    Component Value Date/Time   NA 139 04/11/2017 1158   K 4.7 04/11/2017 1158   CL 104 04/11/2017 1158   CO2 22 04/11/2017 1158   GLUCOSE 82 04/11/2017 1158   GLUCOSE 87 09/14/2016 1243   BUN 15 04/11/2017 1158  CREATININE 0.67 04/11/2017 1158   CALCIUM 9.1 04/11/2017 1158   GFRNONAA 107 04/11/2017 1158   GFRAA 124 04/11/2017 1158   Lab Results  Component Value Date   HGBA1C 5.1 04/11/2017   HGBA1C 5.3 10/07/2014   Lab Results  Component Value Date   INSULIN 7.2 04/11/2017   CBC    Component Value Date/Time   WBC 6.4 04/11/2017 1158   WBC 6.5 09/14/2016 1243   RBC 4.88 04/11/2017 1158   RBC 4.77 09/14/2016 1243   HGB 13.7 04/11/2017 1158   HCT 40.3 04/11/2017 1158   PLT 223 09/14/2016 1243   MCV 83 04/11/2017 1158   MCH 28.1 04/11/2017 1158   MCH 28.3 09/14/2016 1243   MCHC 34.0 04/11/2017 1158   MCHC 33.1 09/14/2016 1243   RDW 13.9 04/11/2017 1158   LYMPHSABS 1.9 04/11/2017 1158   MONOABS 0.7 10/06/2015 0134   EOSABS 0.1 04/11/2017 1158   BASOSABS 0.0 04/11/2017 1158   Iron/TIBC/Ferritin/ %Sat No results found for: IRON, TIBC, FERRITIN, IRONPCTSAT Lipid Panel     Component Value Date/Time   CHOL 167 04/11/2017 1158   TRIG 60 04/11/2017 1158   HDL 55 04/11/2017 1158   CHOLHDL 2.9 09/14/2016 1243   VLDL 10 09/14/2016 1243   LDLCALC 100 (H) 04/11/2017 1158   Hepatic Function Panel     Component Value Date/Time   PROT 6.5 04/11/2017 1158   ALBUMIN 3.9 04/11/2017 1158   AST 34 04/11/2017 1158   ALT 15 04/11/2017 1158   ALKPHOS 63 04/11/2017 1158   BILITOT 0.2 04/11/2017 1158   BILIDIR 0.1 10/06/2015 0134   IBILI 0.2 (L) 10/06/2015 0134      Component Value  Date/Time   TSH 1.920 04/11/2017 1158   Results for JACQLYN, MAROLF (MRN [id number]) as of 05/26/2017 13:53  Ref. Range 04/11/2017 11:58  Vitamin D, 25-Hydroxy Latest Ref Range: 30.0 - 100.0 ng/mL 35.2   ASSESSMENT AND PLAN: Vitamin D deficiency - Plan: Vitamin D, Ergocalciferol, (DRISDOL) 50000 units CAPS capsule  Vitamin B 12 deficiency  At risk for osteoporosis  Class 3 severe obesity with serious comorbidity and body mass index (BMI) of 40.0 to 44.9 in adult, unspecified obesity type ([place])  PLAN:  Vitamin D Deficiency Persais was informed that low vitamin D levels contributes to fatigue and are associated with obesity, breast, and colon cancer. She agrees to continue to take prescription Vit D @50 ,000 IU every week #4 with no refills and will follow up for routine testing of vitamin D, at least 2-3 times per year. She was informed of the risk of over-replacement of vitamin D and agrees to not increase her dose unless she discusses this with [place] first. Maddox agrees to follow up with our clinic in 2 weeks.  At risk for osteopenia and osteoporosis Beckie is at risk for osteopenia and osteoporosis due to her vitamin D deficiency. She was encouraged to take her vitamin D and follow her higher calcium diet and increase strengthening exercise to help strengthen her bones and decrease her risk of osteopenia and osteoporosis.  Vitamin B12 deficiency Xuan will work on increasing B12 rich foods in her diet. Alandra will continue to take B12 supplement daily and will follow up at the agreed upon time.  Obesity Acelyn is currently in the action stage of change. As such, her goal is to continue with weight loss efforts She has agreed to follow the Category 4 plan Anadia has been instructed to work up to a goal of  150 minutes of combined cardio and strengthening exercise per week for weight loss and overall health benefits. We discussed the following Behavioral Modification  Strategies today: planning for success, keep a strict food journal, better snacking choices, increasing lean protein intake and work on meal planning and easy cooking plans  Sura has agreed to follow up with our clinic in 2 weeks. She was informed of the importance of frequent follow up visits to maximize her success with intensive lifestyle modifications for her multiple health conditions.   OBESITY BEHAVIORAL INTERVENTION VISIT  Today's visit was # 3 out of 22.  Starting weight: 279 lbs Starting date: 04/11/17 Today's weight : 269 lbs Today's date: 05/24/2017 Total lbs lost to date: 10 (Patients must lose 7 lbs in the first 6 months to continue with counseling)   ASK: We discussed the diagnosis of obesity with Meghan [name] today and Meghan [name] agreed to give [place] permission to discuss obesity behavioral modification therapy today.  ASSESS: Meghan [name] has the diagnosis of obesity and her BMI today is 39.71 Meghan [name] is in the action stage of change   ADVISE: Meghan [name] was educated on the multiple health risks of obesity as well as the benefit of weight loss to improve her health. She was advised of the need for long term treatment and the importance of lifestyle modifications.  AGREE: Multiple dietary modification options and treatment options were discussed and  Meghan [name] agreed to the above obesity treatment plan.  I, Doreene Nest, am acting as transcriptionist for Eber Jones, MD  I have reviewed the above documentation for accuracy and completeness, and I agree with the above. - Ilene Qua, MD

## 2017-06-09 ENCOUNTER — Ambulatory Visit (INDEPENDENT_AMBULATORY_CARE_PROVIDER_SITE_OTHER): Payer: Federal, State, Local not specified - PPO | Admitting: Family Medicine

## 2017-06-09 VITALS — BP 116/78 | HR 85 | Temp 97.8°F | Ht 69.0 in | Wt 266.0 lb

## 2017-06-09 DIAGNOSIS — E559 Vitamin D deficiency, unspecified: Secondary | ICD-10-CM

## 2017-06-09 DIAGNOSIS — E538 Deficiency of other specified B group vitamins: Secondary | ICD-10-CM

## 2017-06-09 DIAGNOSIS — Z6839 Body mass index (BMI) 39.0-39.9, adult: Secondary | ICD-10-CM

## 2017-06-09 DIAGNOSIS — E66812 Obesity, class 2: Secondary | ICD-10-CM

## 2017-06-09 MED ORDER — VITAMIN D (ERGOCALCIFEROL) 1.25 MG (50000 UNIT) PO CAPS: 50000.0000 [IU] | ORAL_CAPSULE | ORAL | 0 refills | Status: DC

## 2017-06-09 NOTE — Progress Notes (Signed)
Office: [phone]  /  Fax: [phone]   HPI:   Chief Complaint: OBESITY Meghan [name] is here to discuss her progress with her obesity treatment plan. She is on the Category 4 plan and is following her eating plan approximately 95 % of the time. She states she is walking for 20 minutes 3 times per week. Meghan [name] is struggling with yogurt in the meal plan and is looking for other options. Meghan [name] [employer] [place]. Her weight is 266 lb (120.7 kg) today and has had a weight loss of 3 pounds over a period of 2 weeks since her last visit. She has lost 13 lbs since starting treatment with [place].  Vitamin D deficiency Arelie has a diagnosis of vitamin D deficiency. She is currently taking vit D and admits fatigue but denies nausea, vomiting or muscle weakness.  Vitamin B12 deficiency Meghan [name] has a diagnosis of B12 insufficiency and notes fatigue. This is not a new diagnosis. She is on B12 supplement 2,000 mcg daily. Meghan [name] is not a vegetarian and does not have a previous diagnosis of pernicious anemia. She does not have a history of weight loss surgery.  ALLERGIES: Allergies  Allergen Reactions  . Zanaflex [Tizanidine Hcl] Hives  . Morphine Other (See Comments)    Neck pain  . Oxycodone-Acetaminophen Hives and Itching    Itching    MEDICATIONS: Current Outpatient Medications on File Prior to Visit  Medication Sig Dispense Refill  . ALPRAZolam (XANAX) 0.5 MG tablet Take 0.5 mg by mouth at bedtime as needed. Sleep.    . Biotin 1 MG CAPS Take 1 capsule by mouth as needed (when remembers).     . cyanocobalamin 2000 MCG tablet Take 2,000 mcg by mouth daily.    . cyclobenzaprine (FLEXERIL) 10 MG tablet Take 1 tablet (10 mg total) by mouth at bedtime. One tablet every night at bedtime as needed for spasm. 30 tablet 0  . diazepam (VALIUM) 5 MG tablet Take 1 tablet (5 mg total) by mouth every 8 (eight) hours as needed for muscle spasms. 15 tablet 0  .  methocarbamol (ROBAXIN) 500 MG tablet Take 2 tablets (1,000 mg total) by mouth every 8 (eight) hours as needed for muscle spasms. 30 tablet 0  . nortriptyline (PAMELOR) 10 MG capsule Take 2 capsules (20 mg total) by mouth at bedtime. 180 capsule 3  . Vitamin D, Ergocalciferol, (DRISDOL) 50000 units CAPS capsule Take 1 capsule (50,000 Units total) by mouth every 7 (seven) days. 4 capsule 0   No current facility-administered medications on file prior to visit.     PAST MEDICAL HISTORY: Past Medical History:  Diagnosis Date  . Abdominal pain    RLQ  . Back pain   . Chest pain, unspecified   . Colitis    see psh for colonoscopy reports  . Constipation   . Dyspnea   . Endometrial polyp 11/09/2012  . Enlarged lymph node    right side - arm  . Family history of ovarian cancer    MGM  . Generalized headaches   . GERD (gastroesophageal reflux disease)   . Hemorrhoids   . Hiatal hernia   . Hip pain   . IBS (irritable bowel syndrome)   . IBS (irritable bowel syndrome)   . Insomnia   . Irregular bleeding 01/01/2013  . Menorrhagia 10/17/2012  . Migraines   . Nausea   . Nephrolithiasis   . Obesity, unspecified   . Palpitations   .  PVC (premature ventricular contraction)   . Vitamin D deficiency     PAST SURGICAL HISTORY: Past Surgical History:  Procedure Laterality Date  . ABLATION     Uterine  . COLONOSCOPY  2005   Dr. Irving Shows. Colitis of cecum and ICV, bx nonspecific  . COLONOSCOPY N/A 02/10/2015   Procedure: COLONOSCOPY;  Surgeon: Daneil Dolin, MD;  Location: [facility];  Service: Endoscopy;  Laterality: N/A;  0730-moved to 915 Office to notify  . colonoscopy with ileoscopy  2008   Dr. Laural Golden. patch of coarse mucosa at TI, bx  neg.  Random colon bx showed minimal active focal colitis ?resolving infection. Felt not c/w IBD.  Marland Kitchen DILITATION & CURRETTAGE/HYSTROSCOPY WITH THERMACHOICE ABLATION N/A 12/01/2012   Procedure: DILATATION & CURETTAGE;REMOVAL OF ENDOMETRIAL POLYP;  HYSTEROSCOPY WITH THERMACHOICE ENDOMETRIAL ABLATION;  Surgeon: Jonnie Kind, MD;  Location: [facility];  Service: Gynecology;  Laterality: N/A;  . ESOPHAGOGASTRODUODENOSCOPY  02/2008   Dr. Gala Romney- birnak appearing tubular esophagus. small hiatal hernia, o/w normal stomach, D1, D2  . KIDNEY STONE SURGERY    . KNEE ARTHROSCOPY  2008   ACL repair  . LAPAROSCOPIC BILATERAL SALPINGECTOMY Bilateral 12/01/2012   Procedure: LAPAROSCOPIC BILATERAL SALPINGECTOMY ;  Surgeon: Jonnie Kind, MD;  Location: [facility];  Service: Gynecology;  Laterality: Bilateral;  . LESION REMOVAL Left 12/01/2012   Procedure: SKIN TAG REMOVAL (Left inner thigh) ;  Surgeon: Jonnie Kind, MD;  Location: [facility];  Service: Gynecology;  Laterality: Left;    SOCIAL HISTORY: Social History   Tobacco Use  . Smoking status: Never Smoker  . Smokeless tobacco: Never Used  Substance Use Topics  . Alcohol use: Yes    Alcohol/week: 0.0 oz    Comment: occ  . Drug use: No    FAMILY HISTORY: Family History  Problem Relation Age of Onset  . Cancer Maternal Grandmother        ovarian cancer  . Diabetes Maternal Grandfather   . Scleroderma Mother   . Sleep apnea Mother   . Obesity Mother   . Hypertension Father   . Cancer Father        liver  . Cirrhosis Father        non-alcoholic  . Obesity Father   . Stroke Paternal Grandmother   . Hypertension Paternal Grandmother   . Other Sister        Transverse myelitis  . Colon cancer Neg Hx     ROS: Review of Systems  Constitutional: Positive for malaise/fatigue and weight loss.  Gastrointestinal: Negative for nausea and vomiting.  Musculoskeletal:       Negative for muscle weakness    PHYSICAL EXAM: Blood pressure 116/78, pulse 85, temperature 97.8 F (36.6 C), temperature source Oral, height 5\' 9"  (1.753 m), weight 266 lb (120.7 kg), SpO2 96 %. Body mass index is 39.28 kg/m. Physical Exam  Constitutional: She is oriented to person, place, and time. She appears  well-developed and well-nourished.  Cardiovascular: Normal rate.  Pulmonary/Chest: Effort normal.  Musculoskeletal: Normal range of motion.  Neurological: She is oriented to person, place, and time.  Skin: Skin is warm and dry.  Psychiatric: She has a normal mood and affect. Her behavior is normal.  Vitals reviewed.   RECENT LABS AND TESTS: BMET    Component Value Date/Time   NA 139 04/11/2017 1158   K 4.7 04/11/2017 1158   CL 104 04/11/2017 1158   CO2 22 04/11/2017 1158   GLUCOSE 82 04/11/2017 1158  GLUCOSE 87 09/14/2016 1243   BUN 15 04/11/2017 1158   CREATININE 0.67 04/11/2017 1158   CALCIUM 9.1 04/11/2017 1158   GFRNONAA 107 04/11/2017 1158   GFRAA 124 04/11/2017 1158   Lab Results  Component Value Date   HGBA1C 5.1 04/11/2017   HGBA1C 5.3 10/07/2014   Lab Results  Component Value Date   INSULIN 7.2 04/11/2017   CBC    Component Value Date/Time   WBC 6.4 04/11/2017 1158   WBC 6.5 09/14/2016 1243   RBC 4.88 04/11/2017 1158   RBC 4.77 09/14/2016 1243   HGB 13.7 04/11/2017 1158   HCT 40.3 04/11/2017 1158   PLT 223 09/14/2016 1243   MCV 83 04/11/2017 1158   MCH 28.1 04/11/2017 1158   MCH 28.3 09/14/2016 1243   MCHC 34.0 04/11/2017 1158   MCHC 33.1 09/14/2016 1243   RDW 13.9 04/11/2017 1158   LYMPHSABS 1.9 04/11/2017 1158   MONOABS 0.7 10/06/2015 0134   EOSABS 0.1 04/11/2017 1158   BASOSABS 0.0 04/11/2017 1158   Iron/TIBC/Ferritin/ %Sat No results found for: IRON, TIBC, FERRITIN, IRONPCTSAT Lipid Panel     Component Value Date/Time   CHOL 167 04/11/2017 1158   TRIG 60 04/11/2017 1158   HDL 55 04/11/2017 1158   CHOLHDL 2.9 09/14/2016 1243   VLDL 10 09/14/2016 1243   LDLCALC 100 (H) 04/11/2017 1158   Hepatic Function Panel     Component Value Date/Time   PROT 6.5 04/11/2017 1158   ALBUMIN 3.9 04/11/2017 1158   AST 34 04/11/2017 1158   ALT 15 04/11/2017 1158   ALKPHOS 63 04/11/2017 1158   BILITOT 0.2 04/11/2017 1158   BILIDIR 0.1 10/06/2015  0134   IBILI 0.2 (L) 10/06/2015 0134      Component Value Date/Time   TSH 1.920 04/11/2017 1158   Results for TOWANNA, AVERY (MRN [id number]) as of 06/09/2017 11:24  Ref. Range 04/11/2017 11:58  Vitamin D, 25-Hydroxy Latest Ref Range: 30.0 - 100.0 ng/mL 35.2   ASSESSMENT AND PLAN: Vitamin D deficiency - Plan: Vitamin D, Ergocalciferol, (DRISDOL) 50000 units CAPS capsule  Vitamin B 12 deficiency  Class 2 severe obesity with serious comorbidity and body mass index (BMI) of 39.0 to 39.9 in adult, unspecified obesity type ([place])  PLAN:  Vitamin D Deficiency Meghan [name] was informed that low vitamin D levels contributes to fatigue and are associated with obesity, breast, and colon cancer. She agrees to continue to take prescription Vit D @50 ,000 IU every week and will follow up for routine testing of vitamin D, at least 2-3 times per year. She was informed of the risk of over-replacement of vitamin D and agrees to not increase her dose unless she discusses this with [place] first.  Vitamin B12 deficiency Meghan [name] will work on increasing B12 rich foods in her diet. Meghan [name] will continue B12 supplement 2,000 mcg daily and we will recheck labs in 1.5 months.   We spent > than 50% of the 15 minute visit on the counseling as documented in the note.  Obesity Meghan [name] is currently in the action stage of change. As such, her goal is to continue with weight loss efforts She has agreed to follow the Category 4 plan Meghan [name] has been instructed to work up to a goal of 150 minutes of combined cardio and strengthening exercise per week for weight loss and overall health benefits. We discussed the following Behavioral Modification Strategies today: planning for success, increasing lean protein intake, increasing vegetables, work on meal planning and easy cooking  plans and travel eating strategies   Meghan [name] has agreed to follow up with our clinic in 2 to 3 weeks. She was informed of the importance of  frequent follow up visits to maximize her success with intensive lifestyle modifications for her multiple health conditions.   OBESITY BEHAVIORAL INTERVENTION VISIT  Today's visit was # 4 out of 22.  Starting weight: 279 lbs Starting date: 04/11/17 Today's weight : 266 lbs Today's date: 06/09/2017 Total lbs lost to date: 13 (Patients must lose 7 lbs in the first 6 months to continue with counseling)   ASK: We discussed the diagnosis of obesity with Meghan [name] today and Sheli agreed to give [place] permission to discuss obesity behavioral modification therapy today.  ASSESS: Meghan [name] has the diagnosis of obesity and her BMI today is 39.26 Meghan [name] is in the action stage of change   ADVISE: Meghan [name] was educated on the multiple health risks of obesity as well as the benefit of weight loss to improve her health. She was advised of the need for long term treatment and the importance of lifestyle modifications.  AGREE: Multiple dietary modification options and treatment options were discussed and  Meghan [name] agreed to the above obesity treatment plan.  I, Doreene Nest, am acting as transcriptionist for Eber Jones, MD  I have reviewed the above documentation for accuracy and completeness, and I agree with the above. - Ilene Qua, MD

## 2017-06-10 DIAGNOSIS — L0202 Furuncle of face: Secondary | ICD-10-CM | POA: Diagnosis not present

## 2017-06-10 DIAGNOSIS — B9689 Other specified bacterial agents as the cause of diseases classified elsewhere: Secondary | ICD-10-CM | POA: Diagnosis not present

## 2017-06-30 ENCOUNTER — Ambulatory Visit (INDEPENDENT_AMBULATORY_CARE_PROVIDER_SITE_OTHER): Payer: Federal, State, Local not specified - PPO | Admitting: Family Medicine

## 2017-07-11 ENCOUNTER — Ambulatory Visit (INDEPENDENT_AMBULATORY_CARE_PROVIDER_SITE_OTHER): Payer: Federal, State, Local not specified - PPO | Admitting: Family Medicine

## 2017-07-11 VITALS — BP 116/73 | HR 87 | Temp 98.1°F | Ht 69.0 in | Wt 268.0 lb

## 2017-07-11 DIAGNOSIS — E88819 Insulin resistance, unspecified: Secondary | ICD-10-CM

## 2017-07-11 DIAGNOSIS — Z9189 Other specified personal risk factors, not elsewhere classified: Secondary | ICD-10-CM

## 2017-07-11 DIAGNOSIS — E66812 Obesity, class 2: Secondary | ICD-10-CM

## 2017-07-11 DIAGNOSIS — Z6839 Body mass index (BMI) 39.0-39.9, adult: Secondary | ICD-10-CM | POA: Diagnosis not present

## 2017-07-11 DIAGNOSIS — E538 Deficiency of other specified B group vitamins: Secondary | ICD-10-CM

## 2017-07-11 DIAGNOSIS — E559 Vitamin D deficiency, unspecified: Secondary | ICD-10-CM | POA: Diagnosis not present

## 2017-07-11 DIAGNOSIS — E7849 Other hyperlipidemia: Secondary | ICD-10-CM

## 2017-07-11 DIAGNOSIS — E8881 Metabolic syndrome: Secondary | ICD-10-CM | POA: Diagnosis not present

## 2017-07-11 MED ORDER — VITAMIN D (ERGOCALCIFEROL) 1.25 MG (50000 UNIT) PO CAPS: 50000.0000 [IU] | ORAL_CAPSULE | ORAL | 0 refills | Status: DC

## 2017-07-11 NOTE — Progress Notes (Signed)
Office: [phone]  /  Fax: [phone]   HPI:   Chief Complaint: OBESITY Meghan [name] is here to discuss her progress with her obesity treatment plan. She is on the Category 4 plan and is following her eating plan approximately 90 % of the time. She states she is walking for 20 minutes 3 times per week. Meghan [name] was on vacation and over indulged a bit, but she is ready to get back on track. Her weight is 268 lb (121.6 kg) today and has had a weight gain of 2 pounds over a period of 4 weeks since her last visit. She has lost 11 lbs since starting treatment with [place].  Vitamin D deficiency Meghan [name] has a diagnosis of vitamin D deficiency. She is stable on vit D and denies nausea, vomiting or muscle weakness. Meghan [name] is due for labs.  Vitamin B12 deficiency Meghan [name] has a diagnosis of B12 insufficiency and is on B12 supplement 2,000 mcg daily. Fatigue is improved. This is not a new diagnosis. Meghan [name] is not a vegetarian and does not have a previous diagnosis of pernicious anemia. She does not have a history of weight loss surgery. Meghan [name] is due for labs.  Insulin Resistance Meghan [name] has a diagnosis of insulin resistance based on her elevated fasting insulin level >5. Although Meghan [name] blood glucose readings are still under good control, insulin resistance puts her at greater risk of metabolic syndrome and diabetes. Meghan [name] is attempting to control insulin resistance with diet and she continues to work on diet and exercise to decrease risk of diabetes.  Hyperlipidemia Meghan [name] has hyperlipidemia and has been attempting to control her cholesterol levels with intensive lifestyle modification including a low saturated fat diet, exercise and weight loss. She denies any chest pain, claudication or myalgias. Meghan [name] is due for labs.  At risk for cardiovascular disease Meghan [name] is at a higher than average risk for cardiovascular disease due to obesity and hyperlipidemia. She currently denies  any chest pain.  ALLERGIES: Allergies  Allergen Reactions  . Zanaflex [Tizanidine Hcl] Hives  . Morphine Other (See Comments)    Neck pain  . Oxycodone-Acetaminophen Hives and Itching    Itching    MEDICATIONS: Current Outpatient Medications on File Prior to Visit  Medication Sig Dispense Refill  . ALPRAZolam (XANAX) 0.5 MG tablet Take 0.5 mg by mouth at bedtime as needed. Sleep.    . Biotin 1 MG CAPS Take 1 capsule by mouth as needed (when remembers).     . cyanocobalamin 2000 MCG tablet Take 2,000 mcg by mouth daily.    . cyclobenzaprine (FLEXERIL) 10 MG tablet Take 1 tablet (10 mg total) by mouth at bedtime. One tablet every night at bedtime as needed for spasm. 30 tablet 0  . diazepam (VALIUM) 5 MG tablet Take 1 tablet (5 mg total) by mouth every 8 (eight) hours as needed for muscle spasms. 15 tablet 0  . methocarbamol (ROBAXIN) 500 MG tablet Take 2 tablets (1,000 mg total) by mouth every 8 (eight) hours as needed for muscle spasms. 30 tablet 0  . nortriptyline (PAMELOR) 10 MG capsule Take 2 capsules (20 mg total) by mouth at bedtime. 180 capsule 3  . Vitamin D, Ergocalciferol, (DRISDOL) 50000 units CAPS capsule Take 1 capsule (50,000 Units total) by mouth every 7 (seven) days. 4 capsule 0   No current facility-administered medications on file prior to visit.     PAST MEDICAL HISTORY: Past Medical History:  Diagnosis Date  . Abdominal pain    RLQ  . Back  pain   . Chest pain, unspecified   . Colitis    see psh for colonoscopy reports  . Constipation   . Dyspnea   . Endometrial polyp 11/09/2012  . Enlarged lymph node    right side - arm  . Family history of ovarian cancer    MGM  . Generalized headaches   . GERD (gastroesophageal reflux disease)   . Hemorrhoids   . Hiatal hernia   . Hip pain   . IBS (irritable bowel syndrome)   . IBS (irritable bowel syndrome)   . Insomnia   . Irregular bleeding 01/01/2013  . Menorrhagia 10/17/2012  . Migraines   . Nausea   .  Nephrolithiasis   . Obesity, unspecified   . Palpitations   . PVC (premature ventricular contraction)   . Vitamin D deficiency     PAST SURGICAL HISTORY: Past Surgical History:  Procedure Laterality Date  . ABLATION     Uterine  . COLONOSCOPY  2005   Dr. Irving Shows. Colitis of cecum and ICV, bx nonspecific  . COLONOSCOPY N/A 02/10/2015   Procedure: COLONOSCOPY;  Surgeon: Daneil Dolin, MD;  Location: [facility];  Service: Endoscopy;  Laterality: N/A;  0730-moved to 915 Office to notify  . colonoscopy with ileoscopy  2008   Dr. Laural Golden. patch of coarse mucosa at TI, bx  neg.  Random colon bx showed minimal active focal colitis ?resolving infection. Felt not c/w IBD.  Marland Kitchen DILITATION & CURRETTAGE/HYSTROSCOPY WITH THERMACHOICE ABLATION N/A 12/01/2012   Procedure: DILATATION & CURETTAGE;REMOVAL OF ENDOMETRIAL POLYP; HYSTEROSCOPY WITH THERMACHOICE ENDOMETRIAL ABLATION;  Surgeon: Jonnie Kind, MD;  Location: [facility];  Service: Gynecology;  Laterality: N/A;  . ESOPHAGOGASTRODUODENOSCOPY  02/2008   Dr. Gala Romney- birnak appearing tubular esophagus. small hiatal hernia, o/w normal stomach, D1, D2  . KIDNEY STONE SURGERY    . KNEE ARTHROSCOPY  2008   ACL repair  . LAPAROSCOPIC BILATERAL SALPINGECTOMY Bilateral 12/01/2012   Procedure: LAPAROSCOPIC BILATERAL SALPINGECTOMY ;  Surgeon: Jonnie Kind, MD;  Location: [facility];  Service: Gynecology;  Laterality: Bilateral;  . LESION REMOVAL Left 12/01/2012   Procedure: SKIN TAG REMOVAL (Left inner thigh) ;  Surgeon: Jonnie Kind, MD;  Location: [facility];  Service: Gynecology;  Laterality: Left;    SOCIAL HISTORY: Social History   Tobacco Use  . Smoking status: Never Smoker  . Smokeless tobacco: Never Used  Substance Use Topics  . Alcohol use: Yes    Alcohol/week: 0.0 oz    Comment: occ  . Drug use: No    FAMILY HISTORY: Family History  Problem Relation Age of Onset  . Cancer Maternal Grandmother        ovarian cancer  . Diabetes  Maternal Grandfather   . Scleroderma Mother   . Sleep apnea Mother   . Obesity Mother   . Hypertension Father   . Cancer Father        liver  . Cirrhosis Father        non-alcoholic  . Obesity Father   . Stroke Paternal Grandmother   . Hypertension Paternal Grandmother   . Other Sister        Transverse myelitis  . Colon cancer Neg Hx     ROS: Review of Systems  Constitutional: Positive for malaise/fatigue. Negative for weight loss.  Cardiovascular: Negative for chest pain and claudication.  Gastrointestinal: Negative for nausea and vomiting.  Musculoskeletal: Negative for myalgias.       Negative for muscle weakness  PHYSICAL EXAM: Blood pressure 116/73, pulse 87, temperature 98.1 F (36.7 C), temperature source Oral, height 5\' 9"  (1.753 m), weight 268 lb (121.6 kg), SpO2 97 %. Body mass index is 39.58 kg/m. Physical Exam  Constitutional: She is oriented to person, place, and time. She appears well-developed and well-nourished.  Cardiovascular: Normal rate.  Pulmonary/Chest: Effort normal.  Musculoskeletal: Normal range of motion.  Neurological: She is oriented to person, place, and time.  Skin: Skin is warm and dry.  Psychiatric: She has a normal mood and affect. Her behavior is normal.  Vitals reviewed.   RECENT LABS AND TESTS: BMET    Component Value Date/Time   NA 139 04/11/2017 1158   K 4.7 04/11/2017 1158   CL 104 04/11/2017 1158   CO2 22 04/11/2017 1158   GLUCOSE 82 04/11/2017 1158   GLUCOSE 87 09/14/2016 1243   BUN 15 04/11/2017 1158   CREATININE 0.67 04/11/2017 1158   CALCIUM 9.1 04/11/2017 1158   GFRNONAA 107 04/11/2017 1158   GFRAA 124 04/11/2017 1158   Lab Results  Component Value Date   HGBA1C 5.1 04/11/2017   HGBA1C 5.3 10/07/2014   Lab Results  Component Value Date   INSULIN 7.2 04/11/2017   CBC    Component Value Date/Time   WBC 6.4 04/11/2017 1158   WBC 6.5 09/14/2016 1243   RBC 4.88 04/11/2017 1158   RBC 4.77 09/14/2016  1243   HGB 13.7 04/11/2017 1158   HCT 40.3 04/11/2017 1158   PLT 223 09/14/2016 1243   MCV 83 04/11/2017 1158   MCH 28.1 04/11/2017 1158   MCH 28.3 09/14/2016 1243   MCHC 34.0 04/11/2017 1158   MCHC 33.1 09/14/2016 1243   RDW 13.9 04/11/2017 1158   LYMPHSABS 1.9 04/11/2017 1158   MONOABS 0.7 10/06/2015 0134   EOSABS 0.1 04/11/2017 1158   BASOSABS 0.0 04/11/2017 1158   Iron/TIBC/Ferritin/ %Sat No results found for: IRON, TIBC, FERRITIN, IRONPCTSAT Lipid Panel     Component Value Date/Time   CHOL 167 04/11/2017 1158   TRIG 60 04/11/2017 1158   HDL 55 04/11/2017 1158   CHOLHDL 2.9 09/14/2016 1243   VLDL 10 09/14/2016 1243   LDLCALC 100 (H) 04/11/2017 1158   Hepatic Function Panel     Component Value Date/Time   PROT 6.5 04/11/2017 1158   ALBUMIN 3.9 04/11/2017 1158   AST 34 04/11/2017 1158   ALT 15 04/11/2017 1158   ALKPHOS 63 04/11/2017 1158   BILITOT 0.2 04/11/2017 1158   BILIDIR 0.1 10/06/2015 0134   IBILI 0.2 (L) 10/06/2015 0134      Component Value Date/Time   TSH 1.920 04/11/2017 1158   Results for MAGDELYN, ROEBUCK (MRN [id number]) as of 07/11/2017 09:10  Ref. Range 04/11/2017 11:58  Vitamin D, 25-Hydroxy Latest Ref Range: 30.0 - 100.0 ng/mL 35.2   ASSESSMENT AND PLAN: Vitamin D deficiency - Plan: VITAMIN D 25 Hydroxy (Vit-D Deficiency, Fractures), Vitamin D, Ergocalciferol, (DRISDOL) 50000 units CAPS capsule  Other hyperlipidemia - Plan: Lipid Panel With LDL/HDL Ratio  B12 nutritional deficiency - Plan: Vitamin B12  Insulin resistance - Plan: Comprehensive metabolic panel, Hemoglobin A1c, Insulin, random  At risk for heart disease  Class 2 severe obesity with serious comorbidity and body mass index (BMI) of 39.0 to 39.9 in adult, unspecified obesity type ([place])  PLAN:  Vitamin D Deficiency Meghan [name] was informed that low vitamin D levels contributes to fatigue and are associated with obesity, breast, and colon cancer. She agrees to continue to take  prescription Vit D @50 ,000 IU every week #4 with no refills. We will check labs and she  will follow up for routine testing of vitamin D, at least 2-3 times per year. She was informed of the risk of over-replacement of vitamin D and agrees to not increase her dose unless she discusses this with [place] first. Shawny agrees to follow up as directed.  Vitamin B12 deficiency Meghan [name] will continue to work on diet and exercise. She will work on increasing B12 rich foods in her diet. B12 supplementation was not prescribed today. We will check labs and Meghan [name] will follow up as directed.  Insulin Resistance Meghan [name] will continue to work on weight loss, exercise, and decreasing simple carbohydrates in her diet to help decrease the risk of diabetes. She was informed that eating too many simple carbohydrates or too many calories at one sitting increases the likelihood of GI side effects. We will check labs and Meghan [name] agreed to follow up with [place] as directed to monitor her progress.  Hyperlipidemia Meghan [name] was informed of the American Heart Association Guidelines emphasizing intensive lifestyle modifications as the first line treatment for hyperlipidemia. We discussed many lifestyle modifications today in depth, and Meghan [name] will continue to work on decreasing saturated fats such as fatty red meat, butter and many fried foods. She will also increase vegetables and lean protein in her diet and continue to work on exercise and weight loss efforts. We will check labs and Meghan [name] agrees to follow up as directed.  Cardiovascular risk counseling Meghan [name] was given extended (15 minutes) coronary artery disease prevention counseling today. She is 47 y.o. female and has risk factors for heart disease including obesity and hyperlipidemia. We discussed intensive lifestyle modifications today with an emphasis on specific weight loss instructions and strategies. Pt was also informed of the importance of increasing exercise  and decreasing saturated fats to help prevent heart disease.  Obesity Meghan [name] is currently in the action stage of change. As such, her goal is to continue with weight loss efforts She has agreed to follow the Category 4 plan Meghan [name] has been instructed to work up to a goal of 150 minutes of combined cardio and strengthening exercise per week for weight loss and overall health benefits. We discussed the following Behavioral Modification Strategies today: increase H2O intake, no skipping meals, better snacking options, increasing lean protein intake, work on meal planning and easy cooking plans and emotional eating strategies  Meghan [name] has agreed to follow up with our clinic in 2 to 3 weeks. She was informed of the importance of frequent follow up visits to maximize her success with intensive lifestyle modifications for her multiple health conditions.   OBESITY BEHAVIORAL INTERVENTION VISIT  Today's visit was # 5 out of 22.  Starting weight: 279 lbs Starting date: 04/11/17 Today's weight : 268 lbs Today's date: 07/11/2017 Total lbs lost to date: 11 (Patients must lose 7 lbs in the first 6 months to continue with counseling)   ASK: We discussed the diagnosis of obesity with Sammuel Bailiff today and Adreena agreed to give [place] permission to discuss obesity behavioral modification therapy today.  ASSESS: Maecie has the diagnosis of obesity and her BMI today is 39.56 Bibi is in the action stage of change   ADVISE: Lorraine was educated on the multiple health risks of obesity as well as the benefit of weight loss to improve her health. She was advised of the need for long term treatment and the importance of lifestyle modifications.  AGREE: Multiple  dietary modification options and treatment options were discussed and  Sherial agreed to the above obesity treatment plan.  I, Doreene Nest, am acting as transcriptionist for Dennard Nip, MD  I have reviewed the above  documentation for accuracy and completeness, and I agree with the above. -Dennard Nip, MD

## 2017-07-13 LAB — COMPREHENSIVE METABOLIC PANEL
A/G RATIO: 1.8 (ref 1.2–2.2)
ALBUMIN: 4.1 g/dL (ref 3.5–5.5)
ALK PHOS: 65 IU/L (ref 39–117)
ALT: 11 IU/L (ref 0–32)
AST: 25 IU/L (ref 0–40)
BUN / CREAT RATIO: 21 (ref 9–23)
BUN: 15 mg/dL (ref 6–24)
Bilirubin Total: 0.3 mg/dL (ref 0.0–1.2)
CHLORIDE: 104 mmol/L (ref 96–106)
CO2: 22 mmol/L (ref 20–29)
Calcium: 9.2 mg/dL (ref 8.7–10.2)
Creatinine, Ser: 0.71 mg/dL (ref 0.57–1.00)
GFR calc non Af Amer: 104 mL/min/{1.73_m2} (ref >59)
GFR, EST AFRICAN AMERICAN: 120 mL/min/{1.73_m2} (ref >59)
GLUCOSE: 84 mg/dL (ref 65–99)
Globulin, Total: 2.3 g/dL (ref 1.5–4.5)
Potassium: 4.2 mmol/L (ref 3.5–5.2)
Sodium: 137 mmol/L (ref 134–144)
TOTAL PROTEIN: 6.4 g/dL (ref 6.0–8.5)

## 2017-07-13 LAB — VITAMIN D 25 HYDROXY (VIT D DEFICIENCY, FRACTURES): VIT D 25 HYDROXY: 60.4 ng/mL (ref 30.0–100.0)

## 2017-07-13 LAB — HEMOGLOBIN A1C
Est. average glucose Bld gHb Est-mCnc: 103 mg/dL
HEMOGLOBIN A1C: 5.2 % (ref 4.8–5.6)

## 2017-07-13 LAB — LIPID PANEL WITH LDL/HDL RATIO
Cholesterol, Total: 174 mg/dL (ref 100–199)
HDL: 52 mg/dL (ref >39)
LDL CALC: 105 mg/dL — AB (ref 0–99)
LDL/HDL RATIO: 2 ratio (ref 0.0–3.2)
TRIGLYCERIDES: 84 mg/dL (ref 0–149)
VLDL CHOLESTEROL CAL: 17 mg/dL (ref 5–40)

## 2017-07-13 LAB — INSULIN, RANDOM: INSULIN: 16.3 u[IU]/mL (ref 2.6–24.9)

## 2017-07-13 LAB — VITAMIN B12: Vitamin B-12: 249 pg/mL (ref 232–1245)

## 2017-07-19 ENCOUNTER — Encounter (INDEPENDENT_AMBULATORY_CARE_PROVIDER_SITE_OTHER): Payer: Self-pay | Admitting: Family Medicine

## 2017-07-27 ENCOUNTER — Ambulatory Visit (INDEPENDENT_AMBULATORY_CARE_PROVIDER_SITE_OTHER): Payer: Federal, State, Local not specified - PPO | Admitting: Physician Assistant

## 2017-07-27 VITALS — BP 107/72 | HR 85 | Temp 98.0°F | Ht 69.0 in | Wt 269.0 lb

## 2017-07-27 DIAGNOSIS — E559 Vitamin D deficiency, unspecified: Secondary | ICD-10-CM | POA: Diagnosis not present

## 2017-07-27 DIAGNOSIS — Z6839 Body mass index (BMI) 39.0-39.9, adult: Secondary | ICD-10-CM

## 2017-07-27 NOTE — Progress Notes (Signed)
Office: [phone]  /  Fax: [phone]   HPI:   Chief Complaint: OBESITY Meghan [name] is here to discuss her progress with her obesity treatment plan. She is on the Category 4 plan and is following her eating plan approximately 90 % of the time. She states she is walking for 20 minutes 7 times per week. Meghan [name] was on vacation and deviated with her eating. She would like vacation eating strategies and requests longer follow up time as she will be traveling.  Her weight is 269 lb (122 kg) today and has gained 1 pound since her last visit. She has lost 10 lbs since starting treatment with [place].  Vitamin D Deficiency Meghan [name] has a diagnosis of vitamin D deficiency. She is currently taking prescription Vit D. Vit D improved to 60 and advised her to take Vit D every other week. She denies nausea, vomiting or muscle weakness.  ALLERGIES: Allergies  Allergen Reactions  . Zanaflex [Tizanidine Hcl] Hives  . Morphine Other (See Comments)    Neck pain  . Oxycodone-Acetaminophen Hives and Itching    Itching    MEDICATIONS: Current Outpatient Medications on File Prior to Visit  Medication Sig Dispense Refill  . ALPRAZolam (XANAX) 0.5 MG tablet Take 0.5 mg by mouth at bedtime as needed. Sleep.    . Biotin 1 MG CAPS Take 1 capsule by mouth as needed (when remembers).     . cyanocobalamin 2000 MCG tablet Take 2,000 mcg by mouth daily.    . cyclobenzaprine (FLEXERIL) 10 MG tablet Take 1 tablet (10 mg total) by mouth at bedtime. One tablet every night at bedtime as needed for spasm. 30 tablet 0  . diazepam (VALIUM) 5 MG tablet Take 1 tablet (5 mg total) by mouth every 8 (eight) hours as needed for muscle spasms. 15 tablet 0  . methocarbamol (ROBAXIN) 500 MG tablet Take 2 tablets (1,000 mg total) by mouth every 8 (eight) hours as needed for muscle spasms. 30 tablet 0  . nortriptyline (PAMELOR) 10 MG capsule Take 2 capsules (20 mg total) by mouth at bedtime. 180 capsule 3  . Vitamin D,  Ergocalciferol, (DRISDOL) 50000 units CAPS capsule Take 1 capsule (50,000 Units total) by mouth every 7 (seven) days. 4 capsule 0   No current facility-administered medications on file prior to visit.     PAST MEDICAL HISTORY: Past Medical History:  Diagnosis Date  . Abdominal pain    RLQ  . Back pain   . Chest pain, unspecified   . Colitis    see psh for colonoscopy reports  . Constipation   . Dyspnea   . Endometrial polyp 11/09/2012  . Enlarged lymph node    right side - arm  . Family history of ovarian cancer    MGM  . Generalized headaches   . GERD (gastroesophageal reflux disease)   . Hemorrhoids   . Hiatal hernia   . Hip pain   . IBS (irritable bowel syndrome)   . IBS (irritable bowel syndrome)   . Insomnia   . Irregular bleeding 01/01/2013  . Menorrhagia 10/17/2012  . Migraines   . Nausea   . Nephrolithiasis   . Obesity, unspecified   . Palpitations   . PVC (premature ventricular contraction)   . Vitamin D deficiency     PAST SURGICAL HISTORY: Past Surgical History:  Procedure Laterality Date  . ABLATION     Uterine  . COLONOSCOPY  2005   Dr. Irving Shows. Colitis of cecum and ICV, bx nonspecific  .  COLONOSCOPY N/A 02/10/2015   Procedure: COLONOSCOPY;  Surgeon: Daneil Dolin, MD;  [facility];  Service: Endoscopy;  Laterality: N/A;  0730-moved to 915 Office to notify  . colonoscopy with ileoscopy  2008   Dr. Laural Golden. patch of coarse mucosa at TI, bx  neg.  Random colon bx showed minimal active focal colitis ?resolving infection. Felt not c/w IBD.  Marland Kitchen DILITATION & CURRETTAGE/HYSTROSCOPY WITH THERMACHOICE ABLATION N/A 12/01/2012   Procedure: DILATATION & CURETTAGE;REMOVAL OF ENDOMETRIAL POLYP; HYSTEROSCOPY WITH THERMACHOICE ENDOMETRIAL ABLATION;  Surgeon: Jonnie Kind, MD;  Location: [facility];  Service: Gynecology;  Laterality: N/A;  . ESOPHAGOGASTRODUODENOSCOPY  02/2008   Dr. Gala Romney- birnak appearing tubular esophagus. small hiatal hernia, o/w normal  stomach, D1, D2  . KIDNEY STONE SURGERY    . KNEE ARTHROSCOPY  2008   ACL repair  . LAPAROSCOPIC BILATERAL SALPINGECTOMY Bilateral 12/01/2012   Procedure: LAPAROSCOPIC BILATERAL SALPINGECTOMY ;  Surgeon: Jonnie Kind, MD;  Location: [facility];  Service: Gynecology;  Laterality: Bilateral;  . LESION REMOVAL Left 12/01/2012   Procedure: SKIN TAG REMOVAL (Left inner thigh) ;  Surgeon: Jonnie Kind, MD;  Location: [facility];  Service: Gynecology;  Laterality: Left;    SOCIAL HISTORY: Social History   Tobacco Use  . Smoking status: Never Smoker  . Smokeless tobacco: Never Used  Substance Use Topics  . Alcohol use: Yes    Alcohol/week: 0.0 oz    Comment: occ  . Drug use: No    FAMILY HISTORY: Family History  Problem Relation Age of Onset  . Cancer Maternal Grandmother        ovarian cancer  . Diabetes Maternal Grandfather   . Scleroderma Mother   . Sleep apnea Mother   . Obesity Mother   . Hypertension Father   . Cancer Father        liver  . Cirrhosis Father        non-alcoholic  . Obesity Father   . Stroke Paternal Grandmother   . Hypertension Paternal Grandmother   . Other Sister        Transverse myelitis  . Colon cancer Neg Hx     ROS: Review of Systems  Constitutional: Negative for weight loss.  Gastrointestinal: Negative for nausea and vomiting.  Musculoskeletal:       Negative muscle weakness    PHYSICAL EXAM: Blood pressure 107/72, pulse 85, temperature 98 F (36.7 C), temperature source Oral, height 5\' 9"  (1.753 m), weight 269 lb (122 kg), SpO2 96 %. Body mass index is 39.72 kg/m. Physical Exam  Constitutional: She is oriented to person, place, and time. She appears well-developed and well-nourished.  Cardiovascular: Normal rate.  Pulmonary/Chest: Effort normal.  Musculoskeletal: Normal range of motion.  Neurological: She is oriented to person, place, and time.  Skin: Skin is warm and dry.  Psychiatric: She has a normal mood and affect. Her  behavior is normal.  Vitals reviewed.   RECENT LABS AND TESTS: BMET    Component Value Date/Time   NA 137 07/11/2017 0845   K 4.2 07/11/2017 0845   CL 104 07/11/2017 0845   CO2 22 07/11/2017 0845   GLUCOSE 84 07/11/2017 0845   GLUCOSE 87 09/14/2016 1243   BUN 15 07/11/2017 0845   CREATININE 0.71 07/11/2017 0845   CALCIUM 9.2 07/11/2017 0845   GFRNONAA 104 07/11/2017 0845   GFRAA 120 07/11/2017 0845   Lab Results  Component Value Date   HGBA1C 5.2 07/11/2017   HGBA1C 5.1 04/11/2017  HGBA1C 5.3 10/07/2014   Lab Results  Component Value Date   INSULIN 16.3 07/11/2017   INSULIN 7.2 04/11/2017   CBC    Component Value Date/Time   WBC 6.4 04/11/2017 1158   WBC 6.5 09/14/2016 1243   RBC 4.88 04/11/2017 1158   RBC 4.77 09/14/2016 1243   HGB 13.7 04/11/2017 1158   HCT 40.3 04/11/2017 1158   PLT 223 09/14/2016 1243   MCV 83 04/11/2017 1158   MCH 28.1 04/11/2017 1158   MCH 28.3 09/14/2016 1243   MCHC 34.0 04/11/2017 1158   MCHC 33.1 09/14/2016 1243   RDW 13.9 04/11/2017 1158   LYMPHSABS 1.9 04/11/2017 1158   MONOABS 0.7 10/06/2015 0134   EOSABS 0.1 04/11/2017 1158   BASOSABS 0.0 04/11/2017 1158   Iron/TIBC/Ferritin/ %Sat No results found for: IRON, TIBC, FERRITIN, IRONPCTSAT Lipid Panel     Component Value Date/Time   CHOL 174 07/11/2017 0845   TRIG 84 07/11/2017 0845   HDL 52 07/11/2017 0845   CHOLHDL 2.9 09/14/2016 1243   VLDL 10 09/14/2016 1243   LDLCALC 105 (H) 07/11/2017 0845   Hepatic Function Panel     Component Value Date/Time   PROT 6.4 07/11/2017 0845   ALBUMIN 4.1 07/11/2017 0845   AST 25 07/11/2017 0845   ALT 11 07/11/2017 0845   ALKPHOS 65 07/11/2017 0845   BILITOT 0.3 07/11/2017 0845   BILIDIR 0.1 10/06/2015 0134   IBILI 0.2 (L) 10/06/2015 0134      Component Value Date/Time   TSH 1.920 04/11/2017 1158  Results for ZENAB, GRONEWOLD (MRN [id number]) as of 07/27/2017 10:55  Ref. Range 07/11/2017 08:45  Vitamin D, 25-Hydroxy Latest Ref  Range: 30.0 - 100.0 ng/mL 60.4    ASSESSMENT AND PLAN: Vitamin D deficiency - Plan: Vitamin D, Ergocalciferol, (DRISDOL) 50000 units CAPS capsule  Class 2 severe obesity with serious comorbidity and body mass index (BMI) of 39.0 to 39.9 in adult, unspecified obesity type ([place])  PLAN:  Vitamin D Deficiency Meghan [name] was informed that low vitamin D levels contributes to fatigue and are associated with obesity, breast, and colon cancer. Meghan [name] agrees to decrease prescription Vit D @50 ,000 IU to every 14 days and will follow up for routine testing of vitamin D, at least 2-3 times per year. She was informed of the risk of over-replacement of vitamin D and agrees to not increase her dose unless she discusses this with [place] first. Meghan [name] agrees to follow up with our clinic in 4 weeks.  We spent > than 50% of the 15 minute visit on the counseling as documented in the note.  Obesity Meghan [name] is currently in the action stage of change. As such, her goal is to continue with weight loss efforts She has agreed to portion control better and make smarter food choices, such as increase vegetables and decrease simple carbohydrates  Meghan [name] has been instructed to work up to a goal of 150 minutes of combined cardio and strengthening exercise per week for weight loss and overall health benefits. We discussed the following Behavioral Modification Strategies today: increasing lean protein intake and travel eating strategies    Meghan [name] has agreed to follow up with our clinic in 4 weeks. She was informed of the importance of frequent follow up visits to maximize her success with intensive lifestyle modifications for her multiple health conditions.   OBESITY BEHAVIORAL INTERVENTION VISIT  Today's visit was # 6 out of 22.  Starting weight: 279 lbs Starting date: 04/11/17 Today's weight : 269  lbs  Today's date: 07/27/2017 Total lbs lost to date: 10 (Patients must lose 7 lbs in the first 6 months to continue  with counseling)   ASK: We discussed the diagnosis of obesity with Meghan [name] today and Meghan [name] agreed to give [place] permission to discuss obesity behavioral modification therapy today.  ASSESS: Meghan [name] has the diagnosis of obesity and her BMI today is 39.71 Meghan [name] is in the action stage of change   ADVISE: Meghan [name] was educated on the multiple health risks of obesity as well as the benefit of weight loss to improve her health. She was advised of the need for long term treatment and the importance of lifestyle modifications.  AGREE: Multiple dietary modification options and treatment options were discussed and  Meghan [name] agreed to the above obesity treatment plan.   Meghan [name], am acting as transcriptionist for Meghan Duverney, PA-C I, Meghan [name] [facility], have reviewed this note and agree with its content

## 2017-07-28 DIAGNOSIS — Z6839 Body mass index (BMI) 39.0-39.9, adult: Secondary | ICD-10-CM | POA: Diagnosis not present

## 2017-07-28 DIAGNOSIS — F430 Acute stress reaction: Secondary | ICD-10-CM | POA: Diagnosis not present

## 2017-07-28 DIAGNOSIS — G43009 Migraine without aura, not intractable, without status migrainosus: Secondary | ICD-10-CM | POA: Diagnosis not present

## 2017-08-10 ENCOUNTER — Ambulatory Visit: Payer: Federal, State, Local not specified - PPO | Admitting: Adult Health

## 2017-08-10 ENCOUNTER — Encounter: Payer: Self-pay | Admitting: Adult Health

## 2017-08-10 VITALS — BP 126/78 | HR 88 | Ht 69.0 in | Wt 275.2 lb

## 2017-08-10 DIAGNOSIS — R51 Headache: Secondary | ICD-10-CM

## 2017-08-10 DIAGNOSIS — G43009 Migraine without aura, not intractable, without status migrainosus: Secondary | ICD-10-CM

## 2017-08-10 DIAGNOSIS — R519 Headache, unspecified: Secondary | ICD-10-CM

## 2017-08-10 DIAGNOSIS — R0683 Snoring: Secondary | ICD-10-CM | POA: Diagnosis not present

## 2017-08-10 NOTE — Progress Notes (Signed)
PATIENT: Meghan [name] DOB: July 13, 1970  REASON FOR VISIT: follow up HISTORY FROM: patient  HISTORY OF PRESENT ILLNESS: Today 08/10/17: Meghan [name] is a 47 year old female with a history of migraine headaches.  She returns today for follow-up.  She reports that her headaches have been under relatively good control.  She states that in the last month she had 2 episodes where she had a sharp shooting pain in the right temporal region but this was very brief.  It was not associated with any visual changes.  She continues on nortriptyline 20 mg at bedtime.  She states that she continues to have a hard time sleeping.  She typically wakes up frequently.  She reports that she does snore and she lately has been waking up with a morning headache.  In the past a home sleep test was ordered however this was never completed.  HISTORY 02/03/17 Meghan [name] is a 47 year old female with a history of migraine headaches.  She returns today for follow-up.  She reports that she has approximately 2 headaches a month.  She continues on nortriptyline 10 mg at bedtime.  She reports that she still has trouble sleeping at night.  She has been taking her medication approximately an hour before bedtime as Dr. Brett Fairy recommended.  She states that her dad passed away in the summer and grief has been an issue over the last several months.  She states that she has gained 20 pounds.  Reports that she was doing weight watchers but feels that she needs to consider another program in order to get healthy.  She returns today for an evaluation.   REVIEW OF SYSTEMS: Out of a complete 14 system review of symptoms, the patient complains only of the following symptoms, and all other reviewed systems are negative.  See HPI  ALLERGIES: Allergies  Allergen Reactions  . Zanaflex [Tizanidine Hcl] Hives  . Morphine Other (See Comments)    Neck pain  . Oxycodone-Acetaminophen Hives and Itching    Itching    HOME  MEDICATIONS: Outpatient Medications Prior to Visit  Medication Sig Dispense Refill  . ALPRAZolam (XANAX) 0.5 MG tablet Take 0.5 mg by mouth at bedtime as needed. Sleep.    . Biotin 1 MG CAPS Take 1 capsule by mouth as needed (when remembers).     . cyanocobalamin 2000 MCG tablet Take 2,000 mcg by mouth daily.    . diazepam (VALIUM) 5 MG tablet Take 1 tablet (5 mg total) by mouth every 8 (eight) hours as needed for muscle spasms. 15 tablet 0  . nortriptyline (PAMELOR) 10 MG capsule Take 2 capsules (20 mg total) by mouth at bedtime. 180 capsule 3  . Vitamin D, Ergocalciferol, (DRISDOL) 50000 units CAPS capsule Take 1 capsule (50,000 Units total) by mouth every 7 (seven) days. (Patient taking differently: Take 50,000 Units by mouth every 14 (fourteen) days. ) 4 capsule 0  . cyclobenzaprine (FLEXERIL) 10 MG tablet Take 1 tablet (10 mg total) by mouth at bedtime. One tablet every night at bedtime as needed for spasm. 30 tablet 0  . methocarbamol (ROBAXIN) 500 MG tablet Take 2 tablets (1,000 mg total) by mouth every 8 (eight) hours as needed for muscle spasms. 30 tablet 0   No facility-administered medications prior to visit.     PAST MEDICAL HISTORY: Past Medical History:  Diagnosis Date  . Abdominal pain    RLQ  . Back pain   . Chest pain, unspecified   . Colitis  see psh for colonoscopy reports  . Constipation   . Dyspnea   . Endometrial polyp 11/09/2012  . Enlarged lymph node    right side - arm  . Family history of ovarian cancer    MGM  . Generalized headaches   . GERD (gastroesophageal reflux disease)   . Hemorrhoids   . Hiatal hernia   . Hip pain   . IBS (irritable bowel syndrome)   . IBS (irritable bowel syndrome)   . Insomnia   . Irregular bleeding 01/01/2013  . Menorrhagia 10/17/2012  . Migraines   . Nausea   . Nephrolithiasis   . Obesity, unspecified   . Palpitations   . PVC (premature ventricular contraction)   . Vitamin D deficiency     PAST SURGICAL  HISTORY: Past Surgical History:  Procedure Laterality Date  . ABLATION     Uterine  . COLONOSCOPY  2005   Dr. Irving Shows. Colitis of cecum and ICV, bx nonspecific  . COLONOSCOPY N/A 02/10/2015   Procedure: COLONOSCOPY;  Surgeon: Daneil Dolin, MD;  [facility];  Service: Endoscopy;  Laterality: N/A;  0730-moved to 915 Office to notify  . colonoscopy with ileoscopy  2008   Dr. Laural Golden. patch of coarse mucosa at TI, bx  neg.  Random colon bx showed minimal active focal colitis ?resolving infection. Felt not c/w IBD.  Marland Kitchen DILITATION & CURRETTAGE/HYSTROSCOPY WITH THERMACHOICE ABLATION N/A 12/01/2012   Procedure: DILATATION & CURETTAGE;REMOVAL OF ENDOMETRIAL POLYP; HYSTEROSCOPY WITH THERMACHOICE ENDOMETRIAL ABLATION;  Surgeon: Jonnie Kind, MD;  Location: [facility];  Service: Gynecology;  Laterality: N/A;  . ESOPHAGOGASTRODUODENOSCOPY  02/2008   Dr. Gala Romney- birnak appearing tubular esophagus. small hiatal hernia, o/w normal stomach, D1, D2  . KIDNEY STONE SURGERY    . KNEE ARTHROSCOPY  2008   ACL repair  . LAPAROSCOPIC BILATERAL SALPINGECTOMY Bilateral 12/01/2012   Procedure: LAPAROSCOPIC BILATERAL SALPINGECTOMY ;  Surgeon: Jonnie Kind, MD;  Location: [facility];  Service: Gynecology;  Laterality: Bilateral;  . LESION REMOVAL Left 12/01/2012   Procedure: SKIN TAG REMOVAL (Left inner thigh) ;  Surgeon: Jonnie Kind, MD;  Location: [facility];  Service: Gynecology;  Laterality: Left;    FAMILY HISTORY: Family History  Problem Relation Age of Onset  . Cancer Maternal Grandmother        ovarian cancer  . Diabetes Maternal Grandfather   . Scleroderma Mother   . Sleep apnea Mother   . Obesity Mother   . Hypertension Father   . Cancer Father        liver  . Cirrhosis Father        non-alcoholic  . Obesity Father   . Stroke Paternal Grandmother   . Hypertension Paternal Grandmother   . Other Sister        Transverse myelitis  . Colon cancer Neg Hx     SOCIAL HISTORY: Social  History   Socioeconomic History  . Marital status: Married    Spouse name: Keenan Bachelor  . Number of children: 2  . Years of education: 11  . Highest education level: Not on file  Occupational History  . Occupation: radiology CT Archivist    Employer: [employer]    Comment: ct tech at [employer]  . Occupation: [employer]    Employer: [employer]  . Financial resource strain: Not on file  . Food insecurity:    Worry: Not on file    Inability: Not on file  .  Transportation needs:    Medical: Not on file    Non-medical: Not on file  Tobacco Use  . Smoking status: Never Smoker  . Smokeless tobacco: Never Used  Substance and Sexual Activity  . Alcohol use: Yes    Alcohol/week: 0.0 oz    Comment: occ  . Drug use: No  . Sexual activity: Yes    Birth control/protection: None, Other-see comments, Surgical    Comment: vasectomy; ablation  Lifestyle  . Physical activity:    Days per week: Not on file    Minutes per session: Not on file  . Stress: Not on file  Relationships  . Social connections:    Talks on phone: Not on file    Gets together: Not on file    Attends religious service: Not on file    Active member of club or organization: Not on file    Attends meetings of clubs or organizations: Not on file    Relationship status: Not on file  . Intimate partner violence:    Fear of current or ex partner: Not on file    Emotionally abused: Not on file    Physically abused: Not on file    Forced sexual activity: Not on file  Other Topics Concern  . Not on file  Social History Narrative   Patient is married Keenan Bachelor) and lives at home with her husband and two children.   Patient works full-time at [employer].   Patient has a Associates degree.   Patient is right-handed.   Patient drinks tea occasionally.         PHYSICAL EXAM  Vitals:   08/10/17 1443  BP: 126/78  Pulse: 88  Weight: 275 lb 3.2 oz (124.8 kg)  Height: 5\' 9"  (1.753 m)    Body mass index is 40.64 kg/m.  Generalized: Well developed, in no acute distress   Neurological examination  Mentation: Alert oriented to time, place, history taking. Follows all commands speech and language fluent Cranial nerve II-XII: Pupils were equal round reactive to light. Extraocular movements were full, visual field were full on confrontational test. Facial sensation and strength were normal. Uvula tongue midline. Head turning and shoulder shrug  were normal and symmetric. Motor: The motor testing reveals 5 over 5 strength of all 4 extremities. Good symmetric motor tone is noted throughout.  Sensory: Sensory testing is intact to soft touch on all 4 extremities. No evidence of extinction is noted.  Coordination: Cerebellar testing reveals good finger-nose-finger and heel-to-shin bilaterally.  Gait and station: Gait is normal. Tandem gait is normal. Romberg is negative. No drift is seen.  Reflexes: Deep tendon reflexes are symmetric and normal bilaterally.   DIAGNOSTIC DATA (LABS, IMAGING, TESTING) - I reviewed patient records, labs, notes, testing and imaging myself where available.  Lab Results  Component Value Date   WBC 6.4 04/11/2017   HGB 13.7 04/11/2017   HCT 40.3 04/11/2017   MCV 83 04/11/2017   PLT 223 09/14/2016      Component Value Date/Time   NA 137 07/11/2017 0845   K 4.2 07/11/2017 0845   CL 104 07/11/2017 0845   CO2 22 07/11/2017 0845   GLUCOSE 84 07/11/2017 0845   GLUCOSE 87 09/14/2016 1243   BUN 15 07/11/2017 0845   CREATININE 0.71 07/11/2017 0845   CALCIUM 9.2 07/11/2017 0845   PROT 6.4 07/11/2017 0845   ALBUMIN 4.1 07/11/2017 0845   AST 25 07/11/2017 0845   ALT 11 07/11/2017 0845   ALKPHOS 65  07/11/2017 0845   BILITOT 0.3 07/11/2017 0845   GFRNONAA 104 07/11/2017 0845   GFRAA 120 07/11/2017 0845   Lab Results  Component Value Date   CHOL 174 07/11/2017   HDL 52 07/11/2017   LDLCALC 105 (H) 07/11/2017   TRIG 84 07/11/2017   CHOLHDL 2.9  09/14/2016   Lab Results  Component Value Date   HGBA1C 5.2 07/11/2017   Lab Results  Component Value Date   VITAMINB12 249 07/11/2017   Lab Results  Component Value Date   TSH 1.920 04/11/2017      ASSESSMENT AND PLAN 47 y.o. year old female  has a past medical history of Abdominal pain, Back pain, Chest pain, unspecified, Colitis, Constipation, Dyspnea, Endometrial polyp (11/09/2012), Enlarged lymph node, Family history of ovarian cancer, Generalized headaches, GERD (gastroesophageal reflux disease), Hemorrhoids, Hiatal hernia, Hip pain, IBS (irritable bowel syndrome), IBS (irritable bowel syndrome), Insomnia, Irregular bleeding (01/01/2013), Menorrhagia (10/17/2012), Migraines, Nausea, Nephrolithiasis, Obesity, unspecified, Palpitations, PVC (premature ventricular contraction), and Vitamin D deficiency. here with :  1.  Migraine headaches 2.  Morning headaches 3.  Snoring  The patient will continue on nortriptyline 20 mg at bedtime for migraine headaches.  I have ordered a home sleep test to evaluate for possible sleep apnea.  The patient is advised that if her symptoms worsen or she develops new symptoms she should let us know.  She will follow-up in 6 months or sooner if needed.     [name], MSN, NP-C 08/10/2017, 2:58 PM [facility] [address] [place], [place] [address] [phone]

## 2017-08-10 NOTE — Patient Instructions (Signed)
Your Plan:  Continue Nortriptyline Home sleep test ordered If your symptoms worsen or you develop new symptoms please let us know.       Thank you for coming to see [place] at [facility]. I hope we have been able to provide you high quality care today.  You may receive a patient satisfaction survey over the next few weeks. We would appreciate your feedback and comments so that we may continue to improve ourselves and the health of our patients.

## 2017-08-24 ENCOUNTER — Ambulatory Visit (INDEPENDENT_AMBULATORY_CARE_PROVIDER_SITE_OTHER): Payer: Federal, State, Local not specified - PPO | Admitting: Family Medicine

## 2017-08-29 ENCOUNTER — Ambulatory Visit (INDEPENDENT_AMBULATORY_CARE_PROVIDER_SITE_OTHER): Payer: Federal, State, Local not specified - PPO | Admitting: Neurology

## 2017-08-29 DIAGNOSIS — R0683 Snoring: Secondary | ICD-10-CM | POA: Diagnosis not present

## 2017-08-29 DIAGNOSIS — R519 Headache, unspecified: Secondary | ICD-10-CM

## 2017-08-29 DIAGNOSIS — R51 Headache: Secondary | ICD-10-CM

## 2017-09-04 NOTE — Procedures (Signed)
NAME:  Meghan [name]. Cottrill                                                                DOB: 09/19/1970 MEDICAL RECORD No: [id number]                                                  DOS:  08/29/2017  REFERRING PHYSICIAN: [name], NP / Larey Seat, MD/ cc: Dennard Nip, MD   STUDY PERFORMED: Home Sleep Study on [facility]  HISTORY: 08/10/17: [name] is a morbidly obese 47 year old female with a history of migraine headaches and reports that her headaches have been under good control.  She states that she had 2 episodes in 2 month where a sharp shooting pain in the right temporal region arose, very briefly, and not associated with any visual changes.  She continues on nortriptyline 20 mg at bedtime.  She states that she continues to have a hard time sleeping.  She wakes up frequently, she does snore and she lately has been waking up with a morning headache. BMI was 40.8 Epworth sleepiness score was not endorsed.   STUDY RESULTS:  Total Recording Time: 7 hours 23 minutes, valid 5 h and 34 minutes. Total Apnea/Hypopnea Index (AHI): 1.1 /h, RDI:  5.0 /h. Average Oxygen Saturation: 94%; Lowest Oxygen Saturation: 92 %.  Total Time in Oxygen Saturation below 89 %: 0.0 minutes.  Average Heart Rate:  81 bpm (between 65 and 105 bpm).  IMPRESSION: No clinically significant degree of sleep disordered breathing was recorded, but the patient snored, as indicated by RDI.  No hypoxia or other signs of physiological stress were present, her heart rate remained in NSR.  RECOMMENDATION: No CPAP indication. The patient slept prone for much of the night, and snored loudest in supine. I like for her to keep sleeping prone and to lose weight. I cannot see a correlation between headaches and this HST's results.  I certify that I have reviewed the raw data recording prior to the issuance of this report in accordance with the standards of the American Academy of Sleep Medicine (AASM). Larey Seat, M.D.     09-04-2017    Medical Director of [facility] at [facility], accredited by the AASM. Diplomat of the ABPN and ABSM, Fellow of the AAN and AASM.

## 2017-09-05 ENCOUNTER — Telehealth: Payer: Self-pay | Admitting: Neurology

## 2017-09-05 ENCOUNTER — Encounter: Payer: Self-pay | Admitting: Neurology

## 2017-09-05 NOTE — Telephone Encounter (Signed)
Called patient to discuss sleep study results. No answer at this time. LVM for the patient to call back.   

## 2017-09-07 ENCOUNTER — Ambulatory Visit (INDEPENDENT_AMBULATORY_CARE_PROVIDER_SITE_OTHER): Payer: Federal, State, Local not specified - PPO | Admitting: Family Medicine

## 2017-09-07 ENCOUNTER — Encounter (INDEPENDENT_AMBULATORY_CARE_PROVIDER_SITE_OTHER): Payer: Self-pay | Admitting: Family Medicine

## 2017-09-07 VITALS — BP 118/79 | HR 90 | Temp 98.1°F | Ht 69.0 in | Wt 272.0 lb

## 2017-09-07 DIAGNOSIS — E8881 Metabolic syndrome: Secondary | ICD-10-CM | POA: Diagnosis not present

## 2017-09-07 DIAGNOSIS — E559 Vitamin D deficiency, unspecified: Secondary | ICD-10-CM

## 2017-09-07 DIAGNOSIS — Z9189 Other specified personal risk factors, not elsewhere classified: Secondary | ICD-10-CM

## 2017-09-07 DIAGNOSIS — Z6841 Body Mass Index (BMI) 40.0 and over, adult: Secondary | ICD-10-CM

## 2017-09-07 MED ORDER — VITAMIN D (ERGOCALCIFEROL) 1.25 MG (50000 UNIT) PO CAPS: 50000.0000 [IU] | ORAL_CAPSULE | ORAL | 0 refills | Status: DC

## 2017-09-07 NOTE — Progress Notes (Signed)
Office: [phone]  /  Fax: [phone]   HPI:   Chief Complaint: OBESITY Meghan [name] is here to discuss her progress with her obesity treatment plan. She is on the portion control better and make smarter food choices, such as increase vegetables and decrease simple carbohydrates and is following her eating plan approximately 70 % of the time. She states she is walking for 20 minutes 3 times per week. Meghan [name] has been traveling quite a bit and finding it difficult to follow the plan. She hasn't gone to the grocery store, and has another trip planned for next week.  Her weight is 272 lb (123.4 kg) today and has gained 3 pounds since her last visit. She has lost 7 lbs since starting treatment with [place].  Vitamin D Deficiency Meghan [name] has a diagnosis of vitamin D deficiency. She is currently taking prescription Vit D. She notes fatigue and denies nausea, vomiting or muscle weakness.  At risk for osteopenia and osteoporosis Meghan [name] is at higher risk of osteopenia and osteoporosis due to vitamin D deficiency.   Insulin Resistance Meghan [name] has a diagnosis of insulin resistance based on her elevated fasting insulin level >5. Elevated insulin on last lab draw, and she notes carbohydrate cravings. Although Meghan [name]'s blood glucose readings are still under good control, insulin resistance puts her at greater risk of metabolic syndrome and diabetes. She is not taking metformin currently and continues to work on diet and exercise to decrease risk of diabetes.  ALLERGIES: Allergies  Allergen Reactions  . Zanaflex [Tizanidine Hcl] Hives  . Morphine Other (See Comments)    Neck pain  . Oxycodone-Acetaminophen Hives and Itching    Itching    MEDICATIONS: Current Outpatient Medications on File Prior to Visit  Medication Sig Dispense Refill  . ALPRAZolam (XANAX) 0.5 MG tablet Take 0.5 mg by mouth at bedtime as needed. Sleep.    . Biotin 1 MG CAPS Take 1 capsule by mouth as needed (when remembers).      . cyanocobalamin 2000 MCG tablet Take 2,000 mcg by mouth daily.    . diazepam (VALIUM) 5 MG tablet Take 1 tablet (5 mg total) by mouth every 8 (eight) hours as needed for muscle spasms. 15 tablet 0  . nortriptyline (PAMELOR) 10 MG capsule Take 2 capsules (20 mg total) by mouth at bedtime. 180 capsule 3  . Vitamin D, Ergocalciferol, (DRISDOL) 50000 units CAPS capsule Take 1 capsule (50,000 Units total) by mouth every 7 (seven) days. (Patient taking differently: Take 50,000 Units by mouth every 14 (fourteen) days. ) 4 capsule 0   No current facility-administered medications on file prior to visit.     PAST MEDICAL HISTORY: Past Medical History:  Diagnosis Date  . Abdominal pain    RLQ  . Back pain   . Chest pain, unspecified   . Colitis    see psh for colonoscopy reports  . Constipation   . Dyspnea   . Endometrial polyp 11/09/2012  . Enlarged lymph node    right side - arm  . Family history of ovarian cancer    MGM  . Generalized headaches   . GERD (gastroesophageal reflux disease)   . Hemorrhoids   . Hiatal hernia   . Hip pain   . IBS (irritable bowel syndrome)   . IBS (irritable bowel syndrome)   . Insomnia   . Irregular bleeding 01/01/2013  . Menorrhagia 10/17/2012  . Migraines   . Nausea   . Nephrolithiasis   . Obesity, unspecified   .  Palpitations   . PVC (premature ventricular contraction)   . Vitamin D deficiency     PAST SURGICAL HISTORY: Past Surgical History:  Procedure Laterality Date  . ABLATION     Uterine  . COLONOSCOPY  2005   Dr. Irving Shows. Colitis of cecum and ICV, bx nonspecific  . COLONOSCOPY N/A 02/10/2015   Procedure: COLONOSCOPY;  Surgeon: Daneil Dolin, MD;  Location: [facility];  Service: Endoscopy;  Laterality: N/A;  0730-moved to 915 Office to notify  . colonoscopy with ileoscopy  2008   Dr. Laural Golden. patch of coarse mucosa at TI, bx  neg.  Random colon bx showed minimal active focal colitis ?resolving infection. Felt not c/w IBD.    Marland Kitchen DILITATION & CURRETTAGE/HYSTROSCOPY WITH THERMACHOICE ABLATION N/A 12/01/2012   Procedure: DILATATION & CURETTAGE;REMOVAL OF ENDOMETRIAL POLYP; HYSTEROSCOPY WITH THERMACHOICE ENDOMETRIAL ABLATION;  Surgeon: Jonnie Kind, MD;  Location: [facility];  Service: Gynecology;  Laterality: N/A;  . ESOPHAGOGASTRODUODENOSCOPY  02/2008   Dr. Gala Romney- birnak appearing tubular esophagus. small hiatal hernia, o/w normal stomach, D1, D2  . KIDNEY STONE SURGERY    . KNEE ARTHROSCOPY  2008   ACL repair  . LAPAROSCOPIC BILATERAL SALPINGECTOMY Bilateral 12/01/2012   Procedure: LAPAROSCOPIC BILATERAL SALPINGECTOMY ;  Surgeon: Jonnie Kind, MD;  Location: [facility];  Service: Gynecology;  Laterality: Bilateral;  . LESION REMOVAL Left 12/01/2012   Procedure: SKIN TAG REMOVAL (Left inner thigh) ;  Surgeon: Jonnie Kind, MD;  Location: [facility];  Service: Gynecology;  Laterality: Left;    SOCIAL HISTORY: Social History   Tobacco Use  . Smoking status: Never Smoker  . Smokeless tobacco: Never Used  Substance Use Topics  . Alcohol use: Yes    Alcohol/week: 0.0 standard drinks    Comment: occ  . Drug use: No    FAMILY HISTORY: Family History  Problem Relation Age of Onset  . Cancer Maternal Grandmother        ovarian cancer  . Diabetes Maternal Grandfather   . Scleroderma Mother   . Sleep apnea Mother   . Obesity Mother   . Hypertension Father   . Cancer Father        liver  . Cirrhosis Father        non-alcoholic  . Obesity Father   . Stroke Paternal Grandmother   . Hypertension Paternal Grandmother   . Other Sister        Transverse myelitis  . Colon cancer Neg Hx     ROS: Review of Systems  Constitutional: Positive for malaise/fatigue. Negative for weight loss.  Gastrointestinal: Negative for nausea and vomiting.  Musculoskeletal:       Negative muscle weakness    PHYSICAL EXAM: Blood pressure 118/79, pulse 90, temperature 98.1 F (36.7 C), temperature source Oral, height 5\' 9"   (1.753 m), weight 272 lb (123.4 kg), SpO2 96 %. Body mass index is 40.17 kg/m. Physical Exam  Constitutional: She is oriented to person, place, and time. She appears well-developed and well-nourished.  Cardiovascular: Normal rate.  Pulmonary/Chest: Effort normal.  Musculoskeletal: Normal range of motion.  Neurological: She is oriented to person, place, and time.  Skin: Skin is warm and dry.  Psychiatric: She has a normal mood and affect. Her behavior is normal.  Vitals reviewed.   RECENT LABS AND TESTS: BMET    Component Value Date/Time   NA 137 07/11/2017 0845   K 4.2 07/11/2017 0845   CL 104 07/11/2017 0845   CO2 22 07/11/2017 0845  GLUCOSE 84 07/11/2017 0845   GLUCOSE 87 09/14/2016 1243   BUN 15 07/11/2017 0845   CREATININE 0.71 07/11/2017 0845   CALCIUM 9.2 07/11/2017 0845   GFRNONAA 104 07/11/2017 0845   GFRAA 120 07/11/2017 0845   Lab Results  Component Value Date   HGBA1C 5.2 07/11/2017   HGBA1C 5.1 04/11/2017   HGBA1C 5.3 10/07/2014   Lab Results  Component Value Date   INSULIN 16.3 07/11/2017   INSULIN 7.2 04/11/2017   CBC    Component Value Date/Time   WBC 6.4 04/11/2017 1158   WBC 6.5 09/14/2016 1243   RBC 4.88 04/11/2017 1158   RBC 4.77 09/14/2016 1243   HGB 13.7 04/11/2017 1158   HCT 40.3 04/11/2017 1158   PLT 223 09/14/2016 1243   MCV 83 04/11/2017 1158   MCH 28.1 04/11/2017 1158   MCH 28.3 09/14/2016 1243   MCHC 34.0 04/11/2017 1158   MCHC 33.1 09/14/2016 1243   RDW 13.9 04/11/2017 1158   LYMPHSABS 1.9 04/11/2017 1158   MONOABS 0.7 10/06/2015 0134   EOSABS 0.1 04/11/2017 1158   BASOSABS 0.0 04/11/2017 1158   Iron/TIBC/Ferritin/ %Sat No results found for: IRON, TIBC, FERRITIN, IRONPCTSAT Lipid Panel     Component Value Date/Time   CHOL 174 07/11/2017 0845   TRIG 84 07/11/2017 0845   HDL 52 07/11/2017 0845   CHOLHDL 2.9 09/14/2016 1243   VLDL 10 09/14/2016 1243   LDLCALC 105 (H) 07/11/2017 0845   Hepatic Function Panel       Component Value Date/Time   PROT 6.4 07/11/2017 0845   ALBUMIN 4.1 07/11/2017 0845   AST 25 07/11/2017 0845   ALT 11 07/11/2017 0845   ALKPHOS 65 07/11/2017 0845   BILITOT 0.3 07/11/2017 0845   BILIDIR 0.1 10/06/2015 0134   IBILI 0.2 (L) 10/06/2015 0134      Component Value Date/Time   TSH 1.920 04/11/2017 1158  Results for RYLEY, TEATER (MRN [id number]) as of 09/07/2017 17:03  Ref. Range 07/11/2017 08:45  Vitamin D, 25-Hydroxy Latest Ref Range: 30.0 - 100.0 ng/mL 60.4    ASSESSMENT AND PLAN: Vitamin D deficiency - Plan: Vitamin D, Ergocalciferol, (DRISDOL) 50000 units CAPS capsule  Insulin resistance  At risk for osteoporosis  Class 3 severe obesity with serious comorbidity and body mass index (BMI) of 40.0 to 44.9 in adult, unspecified obesity type ([place])  PLAN:  Vitamin D Deficiency Brendolyn was informed that low vitamin D levels contributes to fatigue and are associated with obesity, breast, and colon cancer. Meghan [name] agrees to continue taking prescription Vit D @50 ,000 IU every 14 days #4 and we will refill for 1 month. She will follow up for routine testing of vitamin D, at least 2-3 times per year. She was informed of the risk of over-replacement of vitamin D and agrees to not increase her dose unless she discusses this with [place] first. Meghan [name] agrees to follow up with our clinic in 2 weeks.  At risk for osteopenia and osteoporosis Meghan [name] is at risk for osteopenia and osteoporsis due to her vitamin D deficiency. She was encouraged to take her vitamin D and follow her higher calcium diet and increase strengthening exercise to help strengthen her bones and decrease her risk of osteopenia and osteoporosis.  Insulin Resistance Meghan [name] will continue to work on weight loss, exercise, and decreasing simple carbohydrates in her diet to help decrease the risk of diabetes. We dicussed metformin including benefits and risks. She was informed that eating too many simple  carbohydrates  or too many calories at one sitting increases the likelihood of GI side effects. Meghan [name] declined metformin for now and prescription was not written today. We will repeat labs at the end of September. Meghan [name] agrees to follow up with our clinic in 2 weeks as directed to monitor her progress.  Obesity Meghan [name] is currently in the action stage of change. As such, her goal is to continue with weight loss efforts She has agreed to follow the Category 4 plan Meghan [name] has been instructed to work up to a goal of 150 minutes of combined cardio and strengthening exercise per week for weight loss and overall health benefits. We discussed the following Behavioral Modification Strategies today: increasing lean protein intake, increasing vegetables, work on meal planning and easy cooking plans, and planning for success   Meghan [name] has agreed to follow up with our clinic in 2 weeks. She was informed of the importance of frequent follow up visits to maximize her success with intensive lifestyle modifications for her multiple health conditions.   OBESITY BEHAVIORAL INTERVENTION VISIT  Today's visit was # 7.   Starting weight: 279 lbs Starting date: 04/11/17 Today's weight : 272 lbs Today's date: 09/07/2017 Total lbs lost to date: 7 At least 15 minutes were spent on discussing the following behavioral intervention visit.   ASK: We discussed the diagnosis of obesity with Meghan [name] today and Meghan [name] agreed to give [place] permission to discuss obesity behavioral modification therapy today.  ASSESS: Meghan [name] has the diagnosis of obesity and her BMI today is 40.15 Meghan [name] is in the action stage of change   ADVISE: Amelya was educated on the multiple health risks of obesity as well as the benefit of weight loss to improve her health. She was advised of the need for long term treatment and the importance of lifestyle modifications to improve her current health and to decrease her risk  of future health problems.  AGREE: Multiple dietary modification options and treatment options were discussed and  Meghan [name] agreed to follow the recommendations documented in the above note.  ARRANGE: Meghan [name] was educated on the importance of frequent visits to treat obesity as outlined per CMS and USPSTF guidelines and agreed to schedule her next follow up appointment today.  I, Trixie Dredge, am acting as transcriptionist for Ilene Qua  I have reviewed the above documentation for accuracy and completeness, and I agree with the above. - Ilene Qua, MD

## 2017-09-09 ENCOUNTER — Ambulatory Visit: Payer: Federal, State, Local not specified - PPO | Admitting: Adult Health

## 2017-09-21 NOTE — Progress Notes (Signed)
I agree with the assessment and plan as directed by NP .The patient is known to me .   Bernese Doffing, MD  

## 2017-09-28 ENCOUNTER — Ambulatory Visit (INDEPENDENT_AMBULATORY_CARE_PROVIDER_SITE_OTHER): Payer: Federal, State, Local not specified - PPO | Admitting: Family Medicine

## 2017-09-28 ENCOUNTER — Encounter (INDEPENDENT_AMBULATORY_CARE_PROVIDER_SITE_OTHER): Payer: Self-pay

## 2017-11-04 DIAGNOSIS — F411 Generalized anxiety disorder: Secondary | ICD-10-CM | POA: Diagnosis not present

## 2017-11-04 DIAGNOSIS — Z6841 Body Mass Index (BMI) 40.0 and over, adult: Secondary | ICD-10-CM | POA: Diagnosis not present

## 2017-11-14 DIAGNOSIS — K080 Exfoliation of teeth due to systemic causes: Secondary | ICD-10-CM | POA: Diagnosis not present

## 2017-12-09 DIAGNOSIS — F411 Generalized anxiety disorder: Secondary | ICD-10-CM | POA: Diagnosis not present

## 2017-12-09 DIAGNOSIS — Z6841 Body Mass Index (BMI) 40.0 and over, adult: Secondary | ICD-10-CM | POA: Diagnosis not present

## 2018-01-06 MED FILL — AZITHROMYCIN 250 MG TABLET: 250 | 5 days supply | Qty: 6 | Fill #0

## 2018-02-03 DIAGNOSIS — F411 Generalized anxiety disorder: Secondary | ICD-10-CM | POA: Diagnosis not present

## 2018-02-03 DIAGNOSIS — Z6841 Body Mass Index (BMI) 40.0 and over, adult: Secondary | ICD-10-CM | POA: Diagnosis not present

## 2018-02-10 ENCOUNTER — Encounter: Payer: Self-pay | Admitting: Plastic Surgery

## 2018-02-10 ENCOUNTER — Ambulatory Visit: Payer: Federal, State, Local not specified - PPO | Admitting: Plastic Surgery

## 2018-02-10 DIAGNOSIS — L821 Other seborrheic keratosis: Secondary | ICD-10-CM | POA: Diagnosis not present

## 2018-02-10 DIAGNOSIS — L989 Disorder of the skin and subcutaneous tissue, unspecified: Secondary | ICD-10-CM | POA: Diagnosis not present

## 2018-02-10 NOTE — Progress Notes (Signed)
   Subjective:    Patient ID: Meghan [name], female    DOB: 08-16-1970, 48 y.o.   MRN: [id number]  The patient is a 48 year old female here for evaluation of a changing skin lesion on her left medial cheek.  She is a Fitzpatrick 1.  She has been using a progressive skin care treatment.  She noticed this come up after the treatments were started.  She has scattered areas of hyperpigmentation.  She has 2 areas of hypopigmentation around the chin as a result of shave excisions for moles.  She has not had any history of skin cancer.  There are 2 slightly raised slightly dark skin lesions on the left cheek.  They have the appearance of seborrheic keratosis early-stage.  They do not look worrisome.  Biopsy was offered.     Review of Systems  Constitutional: Negative.   HENT: Negative.   Eyes: Negative.   Respiratory: Negative.   Cardiovascular: Negative.   Gastrointestinal: Negative.   Endocrine: Negative.   Genitourinary: Negative.   Musculoskeletal: Negative.   Skin: Positive for color change. Negative for wound.  Psychiatric/Behavioral: Negative.        Objective:   Physical Exam Vitals signs and nursing note reviewed.  Constitutional:      Appearance: Normal appearance.  HENT:     Head: Normocephalic and atraumatic.     Nose: Nose normal.     Mouth/Throat:     Mouth: Mucous membranes are moist.  Eyes:     Extraocular Movements: Extraocular movements intact.     Pupils: Pupils are equal, round, and reactive to light.  Cardiovascular:     Rate and Rhythm: Normal rate.  Neurological:     Mental Status: She is alert.  Psychiatric:        Mood and Affect: Mood normal.        Thought Content: Thought content normal.        Judgment: Judgment normal.        Assessment & Plan:  Changing skin lesion  Seborrheic keratoses A biopsy was offered for the patient if she had severe concerns.  She does not.  The area does look like a seborrheic keratosis.  I have recommended  laser treatment.  She could also have it treated with cryotherapy from the dermatology office.  She would like to move ahead with a trial with the laser.

## 2018-02-22 ENCOUNTER — Encounter: Payer: Self-pay | Admitting: Adult Health

## 2018-02-22 ENCOUNTER — Ambulatory Visit: Payer: Federal, State, Local not specified - PPO | Admitting: Adult Health

## 2018-02-22 VITALS — BP 116/81 | HR 81 | Ht 69.0 in | Wt 275.6 lb

## 2018-02-22 DIAGNOSIS — G43009 Migraine without aura, not intractable, without status migrainosus: Secondary | ICD-10-CM

## 2018-02-22 DIAGNOSIS — M542 Cervicalgia: Secondary | ICD-10-CM | POA: Diagnosis not present

## 2018-02-22 MED ORDER — NORTRIPTYLINE HCL 10 MG PO CAPS: 20.0000 mg | ORAL_CAPSULE | Freq: Every day | ORAL | 3 refills | Status: DC

## 2018-02-22 MED ORDER — CYCLOBENZAPRINE HCL 7.5 MG PO TABS: 7.5000 mg | ORAL_TABLET | Freq: Three times a day (TID) | ORAL | 0 refills | Status: DC | PRN

## 2018-02-22 NOTE — Progress Notes (Signed)
PATIENT: Meghan [name] DOB: 1970-08-06  REASON FOR VISIT: follow up HISTORY FROM: patient  Chief Complaint  Patient presents with  . Follow-up    Migraine foillow up room 5 pt alone pt now is having posterior headaches since September 2019 and neck pain     HISTORY OF PRESENT ILLNESS: Today 02/22/18 Meghan [name] is a 48 y.o. female here today for follow up. She is feeling well for the most part but does express concerns of neck and occipital pain. Pain is chronic but has worsened over the past 6 months. Pain is described as a tightness. It occurs daily. She feels that it is worse with stress and after work. Ice and massage help. She is taking nortriptyline 20mg  daily for migraines that does significantly help. She is working with a weight loss provider and is taking phentermine daily. She is working full time and completing her bachelor's degree. She denies vision changes, dizziness, or syncope.   HISTORY: (copied from [place] note on 08/10/2017) Today 08/10/17: Ms. Fehr is a 48 year old female with a history of migraine headaches.  She returns today for follow-up.  She reports that her headaches have been under relatively good control.  She states that in the last month she had 2 episodes where she had a sharp shooting pain in the right temporal region but this was very brief.  It was not associated with any visual changes.  She continues on nortriptyline 20 mg at bedtime.  She states that she continues to have a hard time sleeping.  She typically wakes up frequently.  She reports that she does snore and she lately has been waking up with a morning headache.  In the past a home sleep test was ordered however this was never completed.  REVIEW OF SYSTEMS: Out of a complete 14 system review of symptoms, the patient complains only of the following symptoms, neck pain, migraines, anxiety and all other reviewed systems are negative.  ALLERGIES: Allergies  Allergen Reactions   . Zanaflex [Tizanidine Hcl] Hives  . Morphine Other (See Comments)    Neck pain  . Oxycodone-Acetaminophen Hives and Itching    Itching    HOME MEDICATIONS: Outpatient Medications Prior to Visit  Medication Sig Dispense Refill  . ALPRAZolam (XANAX) 0.5 MG tablet Take 0.5 mg by mouth at bedtime as needed. Sleep.    . Biotin 1 MG CAPS Take 1 capsule by mouth as needed (when remembers).     . cyanocobalamin 2000 MCG tablet Take 2,000 mcg by mouth daily.    . phentermine 37.5 MG capsule Take 37.5 mg by mouth every morning.    . Vitamin D, Ergocalciferol, (DRISDOL) 50000 units CAPS capsule Take 1 capsule (50,000 Units total) by mouth every 14 (fourteen) days. 4 capsule 0  . nortriptyline (PAMELOR) 10 MG capsule Take 2 capsules (20 mg total) by mouth at bedtime. 180 capsule 3  . diazepam (VALIUM) 5 MG tablet Take 1 tablet (5 mg total) by mouth every 8 (eight) hours as needed for muscle spasms. (Patient not taking: Reported on 02/22/2018) 15 tablet 0   No facility-administered medications prior to visit.     PAST MEDICAL HISTORY: Past Medical History:  Diagnosis Date  . Abdominal pain    RLQ  . Back pain   . Chest pain, unspecified   . Colitis    see psh for colonoscopy reports  . Constipation   . Dyspnea   . Endometrial polyp 11/09/2012  . Enlarged lymph node  right side - arm  . Family history of ovarian cancer    MGM  . Generalized headaches   . GERD (gastroesophageal reflux disease)   . Hemorrhoids   . Hiatal hernia   . Hip pain   . IBS (irritable bowel syndrome)   . IBS (irritable bowel syndrome)   . Insomnia   . Irregular bleeding 01/01/2013  . Menorrhagia 10/17/2012  . Migraines   . Nausea   . Nephrolithiasis   . Obesity, unspecified   . Palpitations   . PVC (premature ventricular contraction)   . Vitamin D deficiency     PAST SURGICAL HISTORY: Past Surgical History:  Procedure Laterality Date  . ABLATION     Uterine  . COLONOSCOPY  2005   Dr. Irving Shows. Colitis of cecum and ICV, bx nonspecific  . COLONOSCOPY N/A 02/10/2015   Procedure: COLONOSCOPY;  Surgeon: Daneil Dolin, MD;  Location: [facility];  Service: Endoscopy;  Laterality: N/A;  0730-moved to 915 Office to notify  . colonoscopy with ileoscopy  2008   Dr. Laural Golden. patch of coarse mucosa at TI, bx  neg.  Random colon bx showed minimal active focal colitis ?resolving infection. Felt not c/w IBD.  Marland Kitchen DILITATION & CURRETTAGE/HYSTROSCOPY WITH THERMACHOICE ABLATION N/A 12/01/2012   Procedure: DILATATION & CURETTAGE;REMOVAL OF ENDOMETRIAL POLYP; HYSTEROSCOPY WITH THERMACHOICE ENDOMETRIAL ABLATION;  Surgeon: Jonnie Kind, MD;  Location: [facility];  Service: Gynecology;  Laterality: N/A;  . ESOPHAGOGASTRODUODENOSCOPY  02/2008   Dr. Gala Romney- [place] appearing tubular esophagus. small hiatal hernia, o/w normal stomach, D1, D2  . KIDNEY STONE SURGERY    . KNEE ARTHROSCOPY  2008   ACL repair  . LAPAROSCOPIC BILATERAL SALPINGECTOMY Bilateral 12/01/2012   Procedure: LAPAROSCOPIC BILATERAL SALPINGECTOMY ;  Surgeon: Jonnie Kind, MD;  Location: [facility];  Service: Gynecology;  Laterality: Bilateral;  . LESION REMOVAL Left 12/01/2012   Procedure: SKIN TAG REMOVAL (Left inner thigh) ;  Surgeon: Jonnie Kind, MD;  Location: [facility];  Service: Gynecology;  Laterality: Left;    FAMILY HISTORY: Family History  Problem Relation Age of Onset  . Cancer Maternal Grandmother        ovarian cancer  . Diabetes Maternal Grandfather   . Scleroderma Mother   . Sleep apnea Mother   . Obesity Mother   . Hypertension Father   . Cancer Father        liver  . Cirrhosis Father        non-alcoholic  . Obesity Father   . Stroke Paternal Grandmother   . Hypertension Paternal Grandmother   . Other Sister        Transverse myelitis  . Colon cancer Neg Hx     SOCIAL HISTORY: Social History   Socioeconomic History  . Marital status: Married    Spouse name: Keenan Bachelor  . Number of children: 2  . Years of  education: 9  . Highest education level: Not on file  Occupational History  . Occupation: radiology CT Archivist    Employer: [employer]    Comment: ct tech at [employer]  . Occupation: [employer]    Employer: [employer]  . Financial resource strain: Not on file  . Food insecurity:    Worry: Not on file    Inability: Not on file  . Transportation needs:    Medical: Not on file    Non-medical: Not on file  Tobacco Use  . Smoking status: Never Smoker  .  Smokeless tobacco: Never Used  Substance and Sexual Activity  . Alcohol use: Yes    Alcohol/week: 0.0 standard drinks    Comment: occ  . Drug use: No  . Sexual activity: Yes    Birth control/protection: None, Other-see comments, Surgical    Comment: vasectomy; ablation  Lifestyle  . Physical activity:    Days per week: Not on file    Minutes per session: Not on file  . Stress: Not on file  Relationships  . Social connections:    Talks on phone: Not on file    Gets together: Not on file    Attends religious service: Not on file    Active member of club or organization: Not on file    Attends meetings of clubs or organizations: Not on file    Relationship status: Not on file  . Intimate partner violence:    Fear of current or ex partner: Not on file    Emotionally abused: Not on file    Physically abused: Not on file    Forced sexual activity: Not on file  Other Topics Concern  . Not on file  Social History Narrative   Patient is married Keenan Bachelor) and lives at home with her husband and two children.   Patient works full-time at [employer].   Patient has a Associates degree.   Patient is right-handed.   Patient drinks tea occasionally.       PHYSICAL EXAM  Vitals:   02/22/18 0713  BP: 116/81  Pulse: 81  Weight: 275 lb 9.6 oz (125 kg)  Height: 5\' 9"  (1.753 m)   Body mass index is 40.7 kg/m.  Generalized: Well developed, in no acute distress  Cardiology: normal rate and  rhythm, no murmur noted Neurological examination  Mentation: Alert oriented to time, place, history taking. Follows all commands speech and language fluent Cranial nerve II-XII: Pupils were equal round reactive to light. Extraocular movements were full, visual field were full on confrontational test. Facial sensation and strength were normal. Uvula tongue midline. Head turning and shoulder shrug  were normal and symmetric. Motor: The motor testing reveals 5 over 5 strength of all 4 extremities. Good symmetric motor tone is noted throughout. Pain with palpation of cervical paraspinal muscles  Sensory: Sensory testing is intact to soft touch on all 4 extremities. No evidence of extinction is noted.  Coordination: Cerebellar testing reveals good finger-nose-finger  Gait and station: Gait is normal.   Reflexes: Deep tendon reflexes are symmetric and normal bilaterally.   DIAGNOSTIC DATA (LABS, IMAGING, TESTING) - I reviewed patient records, labs, notes, testing and imaging myself where available.  No flowsheet data found.   Lab Results  Component Value Date   WBC 6.4 04/11/2017   HGB 13.7 04/11/2017   HCT 40.3 04/11/2017   MCV 83 04/11/2017   PLT 223 09/14/2016      Component Value Date/Time   NA 137 07/11/2017 0845   K 4.2 07/11/2017 0845   CL 104 07/11/2017 0845   CO2 22 07/11/2017 0845   GLUCOSE 84 07/11/2017 0845   GLUCOSE 87 09/14/2016 1243   BUN 15 07/11/2017 0845   CREATININE 0.71 07/11/2017 0845   CALCIUM 9.2 07/11/2017 0845   PROT 6.4 07/11/2017 0845   ALBUMIN 4.1 07/11/2017 0845   AST 25 07/11/2017 0845   ALT 11 07/11/2017 0845   ALKPHOS 65 07/11/2017 0845   BILITOT 0.3 07/11/2017 0845   GFRNONAA 104 07/11/2017 0845   GFRAA 120 07/11/2017 0845  Lab Results  Component Value Date   CHOL 174 07/11/2017   HDL 52 07/11/2017   LDLCALC 105 (H) 07/11/2017   TRIG 84 07/11/2017   CHOLHDL 2.9 09/14/2016   Lab Results  Component Value Date   HGBA1C 5.2 07/11/2017    Lab Results  Component Value Date   VITAMINB12 249 07/11/2017   Lab Results  Component Value Date   TSH 1.920 04/11/2017     ASSESSMENT AND PLAN 48 y.o. year old female  has a past medical history of Abdominal pain, Back pain, Chest pain, unspecified, Colitis, Constipation, Dyspnea, Endometrial polyp (11/09/2012), Enlarged lymph node, Family history of ovarian cancer, Generalized headaches, GERD (gastroesophageal reflux disease), Hemorrhoids, Hiatal hernia, Hip pain, IBS (irritable bowel syndrome), IBS (irritable bowel syndrome), Insomnia, Irregular bleeding (01/01/2013), Menorrhagia (10/17/2012), Migraines, Nausea, Nephrolithiasis, Obesity, unspecified, Palpitations, PVC (premature ventricular contraction), and Vitamin D deficiency. here with     ICD-10-CM   1. Migraine without aura and without status migrainosus, not intractable G43.009 nortriptyline (PAMELOR) 10 MG capsule  2. Neck pain M54.2 Ambulatory referral to Physical Therapy    We will add cyclobenzaprine daily at night for neck pain, most likely tension from stress. She is tolerating nortriptyline well. We will continue at current dose. PT for dry needling ordered and neck stretches advised. AVS provided for additional education. I have advised that she call in 4 weeks for progress report. Will adjust plan as needed pending response to therapy. Follow up in the office in 6 months.    Orders Placed This Encounter  Procedures  . Ambulatory referral to Physical Therapy    Referral Priority:   Routine    Referral Type:   Physical Medicine    Referral Reason:   Specialty Services Required    Requested Specialty:   Physical Therapy    Number of Visits Requested:   1     Meds ordered this encounter  Medications  . cyclobenzaprine (FEXMID) 7.5 MG tablet    Sig: Take 1 tablet (7.5 mg total) by mouth 3 (three) times daily as needed for muscle spasms.    Dispense:  30 tablet    Refill:  0    Order Specific Question:    Supervising Provider    Answer:   Melvenia Beam [id number]  . nortriptyline (PAMELOR) 10 MG capsule    Sig: Take 2 capsules (20 mg total) by mouth at bedtime.    Dispense:  180 capsule    Refill:  3    Order Specific Question:   Supervising Provider    Answer:   Melvenia Beam [[id number]]       [facility] XNAT[facility], FNP-C 02/22/2018, 8:35 AM [facility] [address] [place], [place] [address] [phone]

## 2018-02-22 NOTE — Patient Instructions (Addendum)
Start Flexeril daily (after work when you can rest) Neck stretches PT for Dry Needling   Neck Exercises Neck exercises can be important for many reasons:  They can help you to improve and maintain flexibility in your neck. This can be especially important as you age.  They can help to make your neck stronger. This can make movement easier.  They can reduce or prevent neck pain.  They may help your upper back. Ask your health care provider which neck exercises would be best for you. Exercises to improve neck flexibility Neck stretch Repeat this exercise 3-5 times. 1. Do this exercise while standing or while sitting in a chair. 2. Place your feet flat on the floor, shoulder-width apart. 3. Slowly turn your head to the right. Turn it all the way to the right so you can look over your right shoulder. Do not tilt or tip your head. 4. Hold this position for 10-30 seconds. 5. Slowly turn your head to the left, to look over your left shoulder. 6. Hold this position for 10-30 seconds.  Neck retraction Repeat this exercise 8-10 times. Do this 3-4 times a day or as told by your health care provider. 1. Do this exercise while standing or while sitting in a sturdy chair. 2. Look straight ahead. Do not bend your neck. 3. Use your fingers to push your chin backward. Do not bend your neck for this movement. Continue to face straight ahead. If you are doing the exercise properly, you will feel a slight sensation in your throat and a stretch at the back of your neck. 4. Hold the stretch for 1-2 seconds. Relax and repeat. Exercises to improve neck strength Neck press Repeat this exercise 10 times. Do it first thing in the morning and right before bed or as told by your health care provider. 1. Lie on your back on a firm bed or on the floor with a pillow under your head. 2. Use your neck muscles to push your head down on the pillow and straighten your spine. 3. Hold the position as well as you can.  Keep your head facing up and your chin tucked. 4. Slowly count to 5 while holding this position. 5. Relax for a few seconds. Then repeat. Isometric strengthening Do a full set of these exercises 2 times a day or as told by your health care provider. 1. Sit in a supportive chair and place your hand on your forehead. 2. Push forward with your head and neck while pushing back with your hand. Hold for 10 seconds. 3. Relax. Then repeat the exercise 3 times. 4. Next, do thesequence again, this time putting your hand against the back of your head. Use your head and neck to push backward against the hand pressure. 5. Finally, do the same exercise on either side of your head, pushing sideways against the pressure of your hand. Prone head lifts Repeat this exercise 5 times. Do this 2 times a day or as told by your health care provider. 1. Lie face-down, resting on your elbows so that your chest and upper back are raised. 2. Start with your head facing downward, near your chest. Position your chin either on or near your chest. 3. Slowly lift your head upward. Lift until you are looking straight ahead. Then continue lifting your head as far back as you can stretch. 4. Hold your head up for 5 seconds. Then slowly lower it to your starting position. Supine head lifts Repeat this exercise 8-10  times. Do this 2 times a day or as told by your health care provider. 1. Lie on your back, bending your knees to point to the ceiling and keeping your feet flat on the floor. 2. Lift your head slowly off the floor, raising your chin toward your chest. 3. Hold for 5 seconds. 4. Relax and repeat. Scapular retraction Repeat this exercise 5 times. Do this 2 times a day or as told by your health care provider. 1. Stand with your arms at your sides. Look straight ahead. 2. Slowly pull both shoulders backward and downward until you feel a stretch between your shoulder blades in your upper back. 3. Hold for 10-30  seconds. 4. Relax and repeat. Contact a health care provider if:  Your neck pain or discomfort gets much worse when you do an exercise.  Your neck pain or discomfort does not improve within 2 hours after you exercise. If you have any of these problems, stop exercising right away. Do not do the exercises again unless your health care provider says that you can. Get help right away if:  You develop sudden, severe neck pain. If this happens, stop exercising right away. Do not do the exercises again unless your health care provider says that you can. This information is not intended to replace advice given to you by your health care provider. Make sure you discuss any questions you have with your health care provider. Document Released: 12/16/2014 Document Revised: 05/10/2017 Document Reviewed: 07/15/2014 Elsevier Interactive Patient Education  2019 [employer].

## 2018-02-23 ENCOUNTER — Telehealth: Payer: Self-pay | Admitting: Family Medicine

## 2018-02-23 NOTE — Telephone Encounter (Signed)
Meghan [name] from [place] is wanting to know if the pts Rx for cyclobenzaprine (FEXMID) 7.5 MG tablet can be changed to the 5mg  or 10mg  due to the 7.5 being out of stock in all of [place]. Please advise.

## 2018-02-24 ENCOUNTER — Encounter: Payer: Self-pay | Admitting: Plastic Surgery

## 2018-02-24 ENCOUNTER — Ambulatory Visit (INDEPENDENT_AMBULATORY_CARE_PROVIDER_SITE_OTHER): Payer: Self-pay | Admitting: Plastic Surgery

## 2018-02-24 VITALS — BP 129/78 | HR 80 | Temp 98.7°F | Ht 69.0 in | Wt 278.0 lb

## 2018-02-24 DIAGNOSIS — Z719 Counseling, unspecified: Secondary | ICD-10-CM

## 2018-02-24 NOTE — Progress Notes (Signed)
Preoperative Dx: hyperpigmentation and redness of face  Postoperative Dx:  same  Procedure: laser to face   Anesthesia: none  Description of Procedure:  Risks and complications were explained to the patient. Consent was confirmed and signed. Time out was called and all information was confirmed to be correct. The area  area was prepped with alcohol and wiped dry. The VL555 laser was set at 9.4 J/cm2 and 4.7 J/cm2 and 2.5 ms. The face was lasered. The patient tolerated the procedure well and there were no complications. The patient is to follow up in 4 weeks.

## 2018-02-24 NOTE — Telephone Encounter (Signed)
Spoke with Meghan [name] from [place] to give him the new orders from Amy, NP in regards to [facility]. New orders have been placed and patient will be called once the medication is ready for pick up.

## 2018-02-24 NOTE — Telephone Encounter (Signed)
We can change to 5mg . She can take 1.5 tablets TID as needed.

## 2018-03-17 ENCOUNTER — Ambulatory Visit: Payer: Federal, State, Local not specified - PPO | Admitting: Physical Therapy

## 2018-03-24 ENCOUNTER — Encounter: Payer: Self-pay | Admitting: Plastic Surgery

## 2018-03-24 ENCOUNTER — Ambulatory Visit (INDEPENDENT_AMBULATORY_CARE_PROVIDER_SITE_OTHER): Payer: Self-pay | Admitting: Plastic Surgery

## 2018-03-24 VITALS — BP 126/85 | HR 88 | Temp 98.0°F | Ht 69.0 in | Wt 275.0 lb

## 2018-03-24 DIAGNOSIS — Z719 Counseling, unspecified: Secondary | ICD-10-CM

## 2018-03-24 NOTE — Progress Notes (Signed)
Preoperative Dx: hyperpigementatioin  Postoperative Dx:  same  Procedure: laser to face   Anesthesia: none  Description of Procedure:  Risks and complications were explained to the patient. Consent was confirmed. Time out was called and all information was confirmed to be correct. The area was prepped with alcohol and wiped dry. The PR530 laser was set at 7.6 J/cm2. The face was lasered. The patient tolerated the procedure well and there were no complications. The patient is to follow up in 4 weeks.

## 2018-04-07 ENCOUNTER — Ambulatory Visit: Payer: Federal, State, Local not specified - PPO | Admitting: Physical Therapy

## 2018-04-07 DIAGNOSIS — F411 Generalized anxiety disorder: Secondary | ICD-10-CM | POA: Diagnosis not present

## 2018-04-28 ENCOUNTER — Ambulatory Visit: Payer: Federal, State, Local not specified - PPO | Admitting: Plastic Surgery

## 2018-05-04 ENCOUNTER — Telehealth: Payer: Self-pay | Admitting: Plastic Surgery

## 2018-05-04 NOTE — Telephone Encounter (Signed)
Called patient to confirm appointment scheduled for tomorrow. Patient answered the following questions: °1.Has the patient traveled outside of the state of [place] at all within the past 6 weeks? No °2.Does the patient have a fever or cough at all? No °3.Has the patient been tested for COVID? Had a positive COVID test? No °4. Has the patient been in contact with anyone who has tested positive? No ° °

## 2018-05-05 ENCOUNTER — Other Ambulatory Visit: Payer: Self-pay

## 2018-05-05 ENCOUNTER — Ambulatory Visit (INDEPENDENT_AMBULATORY_CARE_PROVIDER_SITE_OTHER): Payer: Self-pay | Admitting: Plastic Surgery

## 2018-05-05 ENCOUNTER — Encounter: Payer: Self-pay | Admitting: Plastic Surgery

## 2018-05-05 VITALS — BP 129/83 | HR 88 | Temp 98.1°F | Ht 69.0 in | Wt 278.2 lb

## 2018-05-05 DIAGNOSIS — Z719 Counseling, unspecified: Secondary | ICD-10-CM

## 2018-05-05 MED ORDER — LIDOCAINE-PRILOCAINE 2.5-2.5 % EX CREA: 1.0000 "application " | TOPICAL_CREAM | CUTANEOUS | 0 refills | Status: DC | PRN

## 2018-05-05 NOTE — Progress Notes (Signed)
Preoperative Dx: redness of face  Postoperative Dx:  same  Procedure: laser to face   Anesthesia: none  Description of Procedure:  Risks and complications were explained to the patient. Consent was confirmed and signed. Time out was called and all information was confirmed to be correct. The area  area was prepped with alcohol and wiped dry. The IPL laser was set at 7.6 J/cm2. The face was lasered. The patient tolerated the procedure well and there were no complications. The patient is to follow up in 4 weeks.

## 2018-05-05 NOTE — Addendum Note (Signed)
Addended by: Wallace Going on: 05/05/2018 10:45 AM   Modules accepted: Orders

## 2018-06-01 ENCOUNTER — Telehealth: Payer: Self-pay | Admitting: Adult Health

## 2018-06-01 NOTE — Telephone Encounter (Signed)
Pt called stating she is still having neck pain and is wanting to know if she can either move up her appt or if she can be advised on what to do to alleviate this continuing pain. Please advise.

## 2018-06-05 NOTE — Telephone Encounter (Signed)
I called and LMVM for pt to return call.  PT dyr needling, neck exercises, flexrid help??

## 2018-06-06 NOTE — Telephone Encounter (Signed)
I called pt.  She has done neck exercises and flexmed for neck pain.  Not really helped.  She attempted 2 appts for PT needling, which they have cancelled due to [phone].  She states that when she moves her neck she feels resistance on inside of head, where her head meets neck.  Ice/heat help some only temporarily. When rotating head notes some dizziness, loud pop/pressure when turning head to Left.  Also mentioned cold R hand??  Last seen 2/20.  Make RV to address??Please advise.

## 2018-06-07 NOTE — Telephone Encounter (Signed)
Ok to schedule VV

## 2018-06-07 NOTE — Telephone Encounter (Signed)
Spoke to pt and relayed that will make VV to address.  06-08-18 at 1430.  DOXY.ME VV Due to current COVID 19 pandemic, our office is severely reducing in office visits until further notice, in order to minimize the risk to our patients and healthcare provider.  Pt understands that although there may be some limitations with this type of visit, we will take all precautions to reduce any security or privacy concerns.  Pt understands that this will be treated like an in office visit and we will file with pt's insurance, and there may be a patient responsible charge related to this service. Email [email or web address]. sent.

## 2018-06-08 ENCOUNTER — Other Ambulatory Visit: Payer: Self-pay

## 2018-06-08 ENCOUNTER — Ambulatory Visit (INDEPENDENT_AMBULATORY_CARE_PROVIDER_SITE_OTHER): Payer: Federal, State, Local not specified - PPO | Admitting: Adult Health

## 2018-06-08 ENCOUNTER — Encounter: Payer: Self-pay | Admitting: Adult Health

## 2018-06-08 DIAGNOSIS — M542 Cervicalgia: Secondary | ICD-10-CM | POA: Diagnosis not present

## 2018-06-08 MED ORDER — CYCLOBENZAPRINE HCL 10 MG PO TABS: 10.0000 mg | ORAL_TABLET | Freq: Every day | ORAL | 0 refills | Status: DC | PRN

## 2018-06-08 NOTE — Progress Notes (Signed)
PATIENT: Meghan [name] DOB: 07-Jul-1970  REASON FOR VISIT: follow up HISTORY FROM: patient  Virtual Visit via Video Note  I connected with Meghan [name] on 06/08/18 at  2:30 PM EDT by a video enabled telemedicine application located remotely at [facility] and verified that I am speaking with the correct person using two identifiers who was located at their own home.   I discussed the limitations of evaluation and management by telemedicine and the availability of in person appointments. The patient expressed understanding and agreed to proceed.   PATIENT: Meghan [name] DOB: [birth year]  REASON FOR VISIT: follow up HISTORY FROM: patient  HISTORY OF PRESENT ILLNESS: Today 06/08/18:  Meghan [name] is a 48 year old female with a history of migraine headaches.  And neck pain.  She returns today for follow-up.  At the last visit she was reporting neck pain that radiates to the occipital region.  She was started on Flexeril and referred for physical therapy.  She states that she did not start physical therapy but she did use Flexeril.  She is unsure that it offered her any benefit so once her prescription ran out she stopped.  She states that the pain started on the left side of the neck and radiates to the occipital region.  She describes it as a squeezing sensation.  She also states that if she tilts her head to the left she has a pressure sensation in the neck.  She also states on occasion if she tilts her head to the left or turns her head to the left and she may have a popping sensation in the neck.  She denies any injuries to her neck.  She had an MRI of the cervical spine in 2018 that was normal.  She does state that when she goes to bed and wakes up the next morning the pain is gone.  However as the day progresses the neck pain increases.  She denies any weakness in the arms or the legs.  No numbness and tingling in the extremities.  No changes in her gait or  balance.  She does say that she has trouble with depth perception.  She does state that physical therapy had called to set up dry needling with her.  She returns today for a virtual visit.  HISTORY: 02/22/18 Meghan [name] is a 48 y.o. female here today for follow up. She is feeling well for the most part but does express concerns of neck and occipital pain. Pain is chronic but has worsened over the past 6 months. Pain is described as a tightness. It occurs daily. She feels that it is worse with stress and after work. Ice and massage help. She is taking nortriptyline 20mg  daily for migraines that does significantly help. She is working with a weight loss provider and is taking phentermine daily. She is working full time and completing her bachelor's degree. She denies vision changes, dizziness, or syncope.     REVIEW OF SYSTEMS: Out of a complete 14 system review of symptoms, the patient complains only of the following symptoms, and all other reviewed systems are negative.  See HPI  ALLERGIES: Allergies  Allergen Reactions  . Zanaflex [Tizanidine Hcl] Hives  . Morphine Other (See Comments)    Neck pain  . Oxycodone-Acetaminophen Hives and Itching    Itching    HOME MEDICATIONS: Outpatient Medications Prior to Visit  Medication Sig Dispense Refill  . ALPRAZolam (XANAX) 0.5 MG tablet Take 0.5  mg by mouth at bedtime as needed. Sleep.    . Biotin 1 MG CAPS Take 1 capsule by mouth as needed (when remembers).     Marland Kitchen buPROPion (WELLBUTRIN SR) 100 MG 12 hr tablet Take 1 tablet by mouth daily.    . cyanocobalamin 2000 MCG tablet Take 2,000 mcg by mouth daily.    . cyclobenzaprine (FEXMID) 7.5 MG tablet Take 7.5 mg by mouth 3 (three) times daily as needed for muscle spasms.    Marland Kitchen lidocaine-prilocaine (EMLA) cream Apply 1 application topically as needed. Apply to laser area 30-45 min prior to procedure 30 g 0  . nortriptyline (PAMELOR) 10 MG capsule Take 2 capsules (20 mg total) by mouth at  bedtime. 180 capsule 3  . phentermine 37.5 MG capsule Take 37.5 mg by mouth every morning.    . Vitamin D, Ergocalciferol, (DRISDOL) 50000 units CAPS capsule Take 1 capsule (50,000 Units total) by mouth every 14 (fourteen) days. 4 capsule 0   No facility-administered medications prior to visit.     PAST MEDICAL HISTORY: Past Medical History:  Diagnosis Date  . Abdominal pain    RLQ  . Back pain   . Chest pain, unspecified   . Colitis    see psh for colonoscopy reports  . Constipation   . Dyspnea   . Endometrial polyp 11/09/2012  . Enlarged lymph node    right side - arm  . Family history of ovarian cancer    MGM  . Generalized headaches   . GERD (gastroesophageal reflux disease)   . Hemorrhoids   . Hiatal hernia   . Hip pain   . IBS (irritable bowel syndrome)   . IBS (irritable bowel syndrome)   . Insomnia   . Irregular bleeding 01/01/2013  . Menorrhagia 10/17/2012  . Migraines   . Nausea   . Nephrolithiasis   . Obesity, unspecified   . Palpitations   . PVC (premature ventricular contraction)   . Vitamin D deficiency     PAST SURGICAL HISTORY: Past Surgical History:  Procedure Laterality Date  . ABLATION     Uterine  . COLONOSCOPY  2005   Dr. Irving Shows. Colitis of cecum and ICV, bx nonspecific  . COLONOSCOPY N/A 02/10/2015   Procedure: COLONOSCOPY;  Surgeon: Daneil Dolin, MD;  Location: [facility];  Service: Endoscopy;  Laterality: N/A;  0730-moved to 915 Office to notify  . colonoscopy with ileoscopy  2008   Dr. Laural Golden. patch of coarse mucosa at TI, bx  neg.  Random colon bx showed minimal active focal colitis ?resolving infection. Felt not c/w IBD.  Marland Kitchen DILITATION & CURRETTAGE/HYSTROSCOPY WITH THERMACHOICE ABLATION N/A 12/01/2012   Procedure: DILATATION & CURETTAGE;REMOVAL OF ENDOMETRIAL POLYP; HYSTEROSCOPY WITH THERMACHOICE ENDOMETRIAL ABLATION;  Surgeon: Jonnie Kind, MD;  Location: [facility];  Service: Gynecology;  Laterality: N/A;  .  ESOPHAGOGASTRODUODENOSCOPY  02/2008   Dr. Gala Romney- birnak appearing tubular esophagus. small hiatal hernia, o/w normal stomach, D1, D2  . KIDNEY STONE SURGERY    . KNEE ARTHROSCOPY  2008   ACL repair  . LAPAROSCOPIC BILATERAL SALPINGECTOMY Bilateral 12/01/2012   Procedure: LAPAROSCOPIC BILATERAL SALPINGECTOMY ;  Surgeon: Jonnie Kind, MD;  Location: [facility];  Service: Gynecology;  Laterality: Bilateral;  . LESION REMOVAL Left 12/01/2012   Procedure: SKIN TAG REMOVAL (Left inner thigh) ;  Surgeon: Jonnie Kind, MD;  Location: [facility];  Service: Gynecology;  Laterality: Left;    FAMILY HISTORY: Family History  Problem Relation Age of  Onset  . Cancer Maternal Grandmother        ovarian cancer  . Diabetes Maternal Grandfather   . Scleroderma Mother   . Sleep apnea Mother   . Obesity Mother   . Hypertension Father   . Cancer Father        liver  . Cirrhosis Father        non-alcoholic  . Obesity Father   . Stroke Paternal Grandmother   . Hypertension Paternal Grandmother   . Other Sister        Transverse myelitis  . Colon cancer Neg Hx     SOCIAL HISTORY: Social History   Socioeconomic History  . Marital status: Married    Spouse name: Keenan Bachelor  . Number of children: 2  . Years of education: 20  . Highest education level: Not on file  Occupational History  . Occupation: radiology CT Archivist    Employer: [employer]    Comment: ct tech at [employer]  . Occupation: [employer]    Employer: [employer]  . Financial resource strain: Not on file  . Food insecurity:    Worry: Not on file    Inability: Not on file  . Transportation needs:    Medical: Not on file    Non-medical: Not on file  Tobacco Use  . Smoking status: Never Smoker  . Smokeless tobacco: Never Used  Substance and Sexual Activity  . Alcohol use: Yes    Alcohol/week: 0.0 standard drinks    Comment: occ  . Drug use: No  . Sexual activity: Yes    Birth  control/protection: None, Other-see comments, Surgical    Comment: vasectomy; ablation  Lifestyle  . Physical activity:    Days per week: Not on file    Minutes per session: Not on file  . Stress: Not on file  Relationships  . Social connections:    Talks on phone: Not on file    Gets together: Not on file    Attends religious service: Not on file    Active member of club or organization: Not on file    Attends meetings of clubs or organizations: Not on file    Relationship status: Not on file  . Intimate partner violence:    Fear of current or ex partner: Not on file    Emotionally abused: Not on file    Physically abused: Not on file    Forced sexual activity: Not on file  Other Topics Concern  . Not on file  Social History Narrative   Patient is married Keenan Bachelor) and lives at home with her husband and two children.   Patient works full-time at [employer].   Patient has a Associates degree.   Patient is right-handed.   Patient drinks tea occasionally.         PHYSICAL EXAM Generalized: Well developed, in no acute distress   Neurological examination  Mentation: Alert oriented to time, place, history taking. Follows all commands speech and language fluent Cranial nerve II-XII:Extraocular movements were full. Facial symmetry noted. uvula tongue midline. Head turning and shoulder shrug  were normal and symmetric. Motor: Good strength throughout subjectively per patient.  Good range of motion in the neck. Sensory: Sensory testing is intact to soft touch on all 4 extremities subjectively per patient Coordination: Cerebellar testing reveals good finger-nose-finger  Gait and station: Patient is parked in her car unable to test gait. Reflexes: UTA  DIAGNOSTIC DATA (LABS, IMAGING, TESTING) -  I reviewed patient records, labs, notes, testing and imaging myself where available.  Lab Results  Component Value Date   WBC 6.4 04/11/2017   HGB 13.7 04/11/2017   HCT 40.3 04/11/2017     MCV 83 04/11/2017   PLT 223 09/14/2016      Component Value Date/Time   NA 137 07/11/2017 0845   K 4.2 07/11/2017 0845   CL 104 07/11/2017 0845   CO2 22 07/11/2017 0845   GLUCOSE 84 07/11/2017 0845   GLUCOSE 87 09/14/2016 1243   BUN 15 07/11/2017 0845   CREATININE 0.71 07/11/2017 0845   CALCIUM 9.2 07/11/2017 0845   PROT 6.4 07/11/2017 0845   ALBUMIN 4.1 07/11/2017 0845   AST 25 07/11/2017 0845   ALT 11 07/11/2017 0845   ALKPHOS 65 07/11/2017 0845   BILITOT 0.3 07/11/2017 0845   GFRNONAA 104 07/11/2017 0845   GFRAA 120 07/11/2017 0845   Lab Results  Component Value Date   CHOL 174 07/11/2017   HDL 52 07/11/2017   LDLCALC 105 (H) 07/11/2017   TRIG 84 07/11/2017   CHOLHDL 2.9 09/14/2016   Lab Results  Component Value Date   HGBA1C 5.2 07/11/2017   Lab Results  Component Value Date   VITAMINB12 249 07/11/2017   Lab Results  Component Value Date   TSH 1.920 04/11/2017      ASSESSMENT AND PLAN 48 y.o. year old female  has a past medical history of Abdominal pain, Back pain, Chest pain, unspecified, Colitis, Constipation, Dyspnea, Endometrial polyp (11/09/2012), Enlarged lymph node, Family history of ovarian cancer, Generalized headaches, GERD (gastroesophageal reflux disease), Hemorrhoids, Hiatal hernia, Hip pain, IBS (irritable bowel syndrome), IBS (irritable bowel syndrome), Insomnia, Irregular bleeding (01/01/2013), Menorrhagia (10/17/2012), Migraines, Nausea, Nephrolithiasis, Obesity, unspecified, Palpitations, PVC (premature ventricular contraction), and Vitamin D deficiency. here with :  1.  Neck pain  I have advised the patient that her neck pain sounds as if it musculoskeletal.  I recommended that we restart Flexeril I will increase her dose to 10 mg she can take 1 tablet as needed daily.  We may increase this if she tolerates it well.  The patient is encouraged to follow-up with physical therapy and schedule appointment for dry needling.  If her neck pain does  not improve we will consider repeating MRI of the cervical spine.  Of course if she has any sudden changes or new symptoms she should let us know.  She will follow-up in 4 months in the office.    I spent 15 minutes with the patient this time was spent reviewing her chart prior to the visit, discussing symptoms and treatment plan.   [name], MSN, NP-C 06/08/2018, 2:30 PM [facility] [address], [place], [place] [address] [phone]

## 2018-06-09 ENCOUNTER — Ambulatory Visit: Payer: Federal, State, Local not specified - PPO | Admitting: Plastic Surgery

## 2018-06-15 ENCOUNTER — Encounter: Payer: Self-pay | Admitting: Adult Health

## 2018-06-15 DIAGNOSIS — R55 Syncope and collapse: Secondary | ICD-10-CM

## 2018-07-14 ENCOUNTER — Ambulatory Visit: Payer: Federal, State, Local not specified - PPO | Admitting: Plastic Surgery

## 2018-07-19 ENCOUNTER — Other Ambulatory Visit: Payer: Self-pay

## 2018-07-19 ENCOUNTER — Ambulatory Visit: Payer: Federal, State, Local not specified - PPO | Admitting: Neurology

## 2018-07-19 DIAGNOSIS — R55 Syncope and collapse: Secondary | ICD-10-CM | POA: Diagnosis not present

## 2018-07-19 NOTE — Procedures (Signed)
    History:  Meghan [name] is a 48 year old patient with a history of multiple episodes of dj vu sensation associated with a feeling of exhaustion following the events.  The patient has been evaluated for possible seizure type episodes.  This is a routine EEG.  No skull defects are noted.  Medications include Wellbutrin, cyclobenzaprine, Pamelor, phentermine, vitamin D, and Xanax.  EEG classification: Normal awake and drowsy  Description of the recording: The background rhythms of this recording consists of a fairly well modulated medium amplitude alpha rhythm of 11 Hz that is reactive to eye opening and closure. As the record progresses, the patient appears to remain in the waking state throughout the recording. Photic stimulation was performed, resulting in a bilateral and symmetric photic driving response. Hyperventilation was also performed, resulting in a minimal buildup of the background rhythm activities without significant slowing seen. Toward the end of the recording, the patient enters the drowsy state with slight symmetric slowing seen. The patient never enters stage II sleep. At no time during the recording does there appear to be evidence of spike or spike wave discharges or evidence of focal slowing. EKG monitor shows no evidence of cardiac rhythm abnormalities with a heart rate of 78.  Impression: This is a normal EEG recording in the waking and drowsy state. No evidence of ictal or interictal discharges are seen.

## 2018-07-20 ENCOUNTER — Telehealth: Payer: Self-pay | Admitting: *Deleted

## 2018-07-20 NOTE — Telephone Encounter (Signed)
-----   Message from [name], NP sent at 07/20/2018  9:40 AM EDT ----- EEG is normal. Continue to monitor. If she has any additional events please let us know

## 2018-07-20 NOTE — Telephone Encounter (Signed)
Spoke to pt and relayed that her EEG normal and she is to monitor and call us if any additional episodes.  She verbalized understanding.

## 2018-07-28 ENCOUNTER — Ambulatory Visit: Payer: Federal, State, Local not specified - PPO | Admitting: Plastic Surgery

## 2018-08-23 ENCOUNTER — Ambulatory Visit: Payer: Federal, State, Local not specified - PPO | Admitting: Adult Health

## 2018-09-05 ENCOUNTER — Encounter: Payer: Self-pay | Admitting: Plastic Surgery

## 2018-09-07 ENCOUNTER — Telehealth: Payer: Self-pay

## 2018-09-07 NOTE — Telephone Encounter (Signed)
Called patient to confirm appointment scheduled for tomorrow. Patient answered the following questions: 1. To the best of your knowledge, have you been in close contact with any one with a confirmed diagnosis of COVID-19? Yes, but with PPE 2. Have you had any one or more of the following; fever, chills, cough, shortness of breath, or any flu-like symptoms? No 3. Have you been diagnosed with or have a previous diagnosis of COVID 19? No 4. I am going to go over a few other symptoms with you. Please let me know if you are experiencing any of the following: None of the below a. Ear, nose, or throat discomfort b. A sore throat c. Headache d. Muscle pain e. Diarrhea f. Loss of taste or smell

## 2018-09-08 ENCOUNTER — Other Ambulatory Visit: Payer: Self-pay

## 2018-09-08 ENCOUNTER — Encounter: Payer: Self-pay | Admitting: Plastic Surgery

## 2018-09-08 ENCOUNTER — Ambulatory Visit (INDEPENDENT_AMBULATORY_CARE_PROVIDER_SITE_OTHER): Payer: Self-pay | Admitting: Plastic Surgery

## 2018-09-08 VITALS — BP 121/79 | HR 89 | Temp 98.2°F | Ht 69.0 in | Wt 277.4 lb

## 2018-09-08 DIAGNOSIS — Z719 Counseling, unspecified: Secondary | ICD-10-CM

## 2018-09-08 NOTE — Progress Notes (Signed)
Preoperative Dx: facial aging  Postoperative Dx:  same  Procedure: laser to rqd3   Anesthesia: none  Description of Procedure:  Risks and complications were explained to the patient. Consent was confirmed and signed. Time out was called and all information was confirmed to be correct. The area  area was prepped with alcohol and wiped dry. The Frax laser was set.  The face was lasered. The patient tolerated the procedure well and there were no complications. The patient is to follow up in 4 weeks.

## 2018-09-28 ENCOUNTER — Telehealth: Payer: Self-pay | Admitting: Adult Health

## 2018-09-28 NOTE — Telephone Encounter (Signed)
I called patient and LVM regarding rescheduling 9/24 appointment due to MM being out that afternoon. Requested patient call back to r/s.

## 2018-10-02 ENCOUNTER — Encounter: Payer: Self-pay | Admitting: Adult Health

## 2018-10-02 NOTE — Telephone Encounter (Signed)
Called patient again today attempting to reschedule 9/24 appointment due to provider being Bruce. No answer, cancelled patient's appointment and have sent a letter to advise patient of cancellation.

## 2018-10-12 ENCOUNTER — Ambulatory Visit: Payer: Federal, State, Local not specified - PPO | Admitting: Adult Health

## 2018-12-23 NOTE — Progress Notes (Signed)
REVIEWED-NO ADDITIONAL RECOMMENDATIONS. 

## 2018-12-29 DIAGNOSIS — R635 Abnormal weight gain: Secondary | ICD-10-CM | POA: Diagnosis not present

## 2018-12-29 DIAGNOSIS — R5382 Chronic fatigue, unspecified: Secondary | ICD-10-CM | POA: Diagnosis not present

## 2019-01-03 DIAGNOSIS — Z1283 Encounter for screening for malignant neoplasm of skin: Secondary | ICD-10-CM | POA: Diagnosis not present

## 2019-01-03 DIAGNOSIS — Z08 Encounter for follow-up examination after completed treatment for malignant neoplasm: Secondary | ICD-10-CM | POA: Diagnosis not present

## 2019-01-03 DIAGNOSIS — Z85828 Personal history of other malignant neoplasm of skin: Secondary | ICD-10-CM | POA: Diagnosis not present

## 2019-01-04 DIAGNOSIS — R635 Abnormal weight gain: Secondary | ICD-10-CM | POA: Diagnosis not present

## 2019-01-04 DIAGNOSIS — Z1331 Encounter for screening for depression: Secondary | ICD-10-CM | POA: Diagnosis not present

## 2019-01-04 DIAGNOSIS — Z6836 Body mass index (BMI) 36.0-36.9, adult: Secondary | ICD-10-CM | POA: Diagnosis not present

## 2019-01-04 DIAGNOSIS — Z1339 Encounter for screening examination for other mental health and behavioral disorders: Secondary | ICD-10-CM | POA: Diagnosis not present

## 2019-01-04 DIAGNOSIS — F419 Anxiety disorder, unspecified: Secondary | ICD-10-CM | POA: Diagnosis not present

## 2019-01-18 DIAGNOSIS — Z6837 Body mass index (BMI) 37.0-37.9, adult: Secondary | ICD-10-CM | POA: Diagnosis not present

## 2019-01-18 DIAGNOSIS — R5382 Chronic fatigue, unspecified: Secondary | ICD-10-CM | POA: Diagnosis not present

## 2019-01-25 DIAGNOSIS — F419 Anxiety disorder, unspecified: Secondary | ICD-10-CM | POA: Diagnosis not present

## 2019-01-25 DIAGNOSIS — Z6837 Body mass index (BMI) 37.0-37.9, adult: Secondary | ICD-10-CM | POA: Diagnosis not present

## 2019-02-01 DIAGNOSIS — E0590 Thyrotoxicosis, unspecified without thyrotoxic crisis or storm: Secondary | ICD-10-CM | POA: Diagnosis not present

## 2019-02-01 DIAGNOSIS — Z6836 Body mass index (BMI) 36.0-36.9, adult: Secondary | ICD-10-CM | POA: Diagnosis not present

## 2019-02-08 DIAGNOSIS — R5382 Chronic fatigue, unspecified: Secondary | ICD-10-CM | POA: Diagnosis not present

## 2019-02-08 DIAGNOSIS — Z6836 Body mass index (BMI) 36.0-36.9, adult: Secondary | ICD-10-CM | POA: Diagnosis not present

## 2019-02-08 DIAGNOSIS — E0590 Thyrotoxicosis, unspecified without thyrotoxic crisis or storm: Secondary | ICD-10-CM | POA: Diagnosis not present

## 2019-02-15 DIAGNOSIS — Z6836 Body mass index (BMI) 36.0-36.9, adult: Secondary | ICD-10-CM | POA: Diagnosis not present

## 2019-02-15 DIAGNOSIS — E0590 Thyrotoxicosis, unspecified without thyrotoxic crisis or storm: Secondary | ICD-10-CM | POA: Diagnosis not present

## 2019-02-19 ENCOUNTER — Other Ambulatory Visit (HOSPITAL_COMMUNITY): Payer: Self-pay | Admitting: Internal Medicine

## 2019-02-19 ENCOUNTER — Other Ambulatory Visit: Payer: Self-pay

## 2019-02-19 ENCOUNTER — Ambulatory Visit (HOSPITAL_COMMUNITY)
Admission: RE | Admit: 2019-02-19 | Discharge: 2019-02-19 | Disposition: A | Discharge status: Home / Self Care | Payer: Federal, State, Local not specified - PPO | Source: Ambulatory Visit | Attending: Internal Medicine | Admitting: Internal Medicine

## 2019-02-19 DIAGNOSIS — R0602 Shortness of breath: Secondary | ICD-10-CM | POA: Insufficient documentation

## 2019-02-19 DIAGNOSIS — Z6839 Body mass index (BMI) 39.0-39.9, adult: Secondary | ICD-10-CM | POA: Diagnosis not present

## 2019-02-19 DIAGNOSIS — Z6841 Body Mass Index (BMI) 40.0 and over, adult: Secondary | ICD-10-CM | POA: Diagnosis not present

## 2019-02-19 DIAGNOSIS — G43009 Migraine without aura, not intractable, without status migrainosus: Secondary | ICD-10-CM | POA: Diagnosis not present

## 2019-02-19 DIAGNOSIS — F411 Generalized anxiety disorder: Secondary | ICD-10-CM | POA: Diagnosis not present

## 2019-02-22 DIAGNOSIS — Z6836 Body mass index (BMI) 36.0-36.9, adult: Secondary | ICD-10-CM | POA: Diagnosis not present

## 2019-02-22 DIAGNOSIS — E0590 Thyrotoxicosis, unspecified without thyrotoxic crisis or storm: Secondary | ICD-10-CM | POA: Diagnosis not present

## 2019-03-01 DIAGNOSIS — R5382 Chronic fatigue, unspecified: Secondary | ICD-10-CM | POA: Diagnosis not present

## 2019-03-01 DIAGNOSIS — E0590 Thyrotoxicosis, unspecified without thyrotoxic crisis or storm: Secondary | ICD-10-CM | POA: Diagnosis not present

## 2019-03-09 ENCOUNTER — Encounter: Payer: Self-pay | Admitting: Adult Health

## 2019-03-09 ENCOUNTER — Ambulatory Visit: Payer: Federal, State, Local not specified - PPO | Admitting: Adult Health

## 2019-03-09 ENCOUNTER — Other Ambulatory Visit: Payer: Self-pay

## 2019-03-09 VITALS — BP 118/68 | HR 91 | Ht 69.0 in | Wt 249.5 lb

## 2019-03-09 DIAGNOSIS — R102 Pelvic and perineal pain: Secondary | ICD-10-CM | POA: Diagnosis not present

## 2019-03-09 DIAGNOSIS — R809 Proteinuria, unspecified: Secondary | ICD-10-CM

## 2019-03-09 DIAGNOSIS — R319 Hematuria, unspecified: Secondary | ICD-10-CM | POA: Diagnosis not present

## 2019-03-09 DIAGNOSIS — N926 Irregular menstruation, unspecified: Secondary | ICD-10-CM | POA: Insufficient documentation

## 2019-03-09 LAB — POCT URINALYSIS DIPSTICK
Glucose, UA: NEGATIVE
Ketones, UA: NEGATIVE
Nitrite, UA: NEGATIVE
Protein, UA: POSITIVE — AB

## 2019-03-09 NOTE — Progress Notes (Signed)
  Subjective:     Patient ID: Meghan [name], female   DOB: [birth year], 49 y.o.   MRN: [id number]  HPI Meghan [name] is a 49 year old white female,married, G2P2 in complaining of pelvic pain for about 4 weeks now PCP is Dr Nevada Crane.  Review of Systems Has had pelvic pain, L>R for about 4 weeks  Periods last 3 days but has more cramps and clots and cycle varies Denies any hot flashes or night sweats Has lost 45 lbs since august, is trying Reviewed past medical,surgical, social and family history. Reviewed medications and allergies.     Objective:   Physical Exam BP 118/68 (BP Location: Left Arm, Patient Position: Sitting, Cuff Size: Large)   Pulse 91   Ht 5\' 9"  (1.753 m)   Wt 249 lb 8 oz (113.2 kg)   LMP 03/07/2019   BMI 36.84 kg/m urine dipstick 3+blood,1+protein and  trace leuks, is on period. Skin warm and dry.Pelvic: external genitalia is normal in appearance no lesions, vagina:+period blood,urethra has no lesions or masses noted, cervix:smooth and bulbous, uterus: normal size, shape and contour, non tender, no masses felt, adnexa: no masses, LLQ tenderness noted, and also RLQ tenderness,no rebound. Bladder is non tender and no masses felt. Fall risk is low Examination chaperoned by Levy Pupa LPN    Assessment:     1. Hematuria, unspecified type  2. Proteinuria, unspecified type  3. Pelvic pain Will get GYN [place] at [facility] to assess uterus and ovaries   4. Irregular menstrual cycle Probably in perimenopause     Plan:     Pap and physical in 4 weeks

## 2019-03-13 ENCOUNTER — Encounter: Payer: Self-pay | Admitting: Orthopaedic Surgery

## 2019-03-13 ENCOUNTER — Ambulatory Visit: Payer: Federal, State, Local not specified - PPO

## 2019-03-13 ENCOUNTER — Ambulatory Visit: Payer: Federal, State, Local not specified - PPO | Admitting: Orthopaedic Surgery

## 2019-03-13 ENCOUNTER — Other Ambulatory Visit: Payer: Self-pay

## 2019-03-13 VITALS — BP 112/74 | HR 83 | Ht 69.0 in | Wt 249.0 lb

## 2019-03-13 DIAGNOSIS — M5442 Lumbago with sciatica, left side: Secondary | ICD-10-CM | POA: Diagnosis not present

## 2019-03-13 MED ORDER — PREDNISONE 5 MG (21) PO TBPK: ORAL_TABLET | ORAL | 0 refills | Status: DC

## 2019-03-13 MED ORDER — CYCLOBENZAPRINE HCL 10 MG PO TABS: 10.0000 mg | ORAL_TABLET | Freq: Every day | ORAL | 0 refills | Status: DC

## 2019-03-13 MED ORDER — HYDROCODONE-ACETAMINOPHEN 5-325 MG PO TABS: ORAL_TABLET | ORAL | 0 refills | Status: DC

## 2019-03-13 NOTE — Patient Instructions (Signed)

## 2019-03-13 NOTE — Progress Notes (Signed)
Patient [id number] Meghan [name], female DOB:1970-02-28, 49 y.o. UK:[id number]  Chief Complaint  Patient presents with  . Back Pain    had severe pain Sunday     HPI  Meghan [name] is a 49 y.o. female who has recurrence of lower back pain.  It began about 10 days ago.  She had severe pain and radiation down the left leg pas the knee to the foot.  She could hardly move. She took ibuprofen and slowly improved.  She has had several more "attacks" but not as severe as the first one.  She has discomfort all the time now.  She has resumed her exercises and they have not helped.  I will give sheet of exercises for her to use.  She has no trauma, no untoward event.  She may need MRI.  I will give prednisone dose pack, pain medicine and Flexeril.  I will see in one week.   Body mass index is 36.77 kg/m.  She has lost over 45 pounds this last year.  ROS  Review of Systems  HENT: Negative for congestion.   Respiratory: Negative for cough and shortness of breath.   Cardiovascular: Negative for chest pain and leg swelling.  Endocrine: Negative for cold intolerance.  Musculoskeletal: Positive for arthralgias, back pain and gait problem.  Allergic/Immunologic: Negative for environmental allergies.  Neurological: Negative for numbness.  All other systems reviewed and are negative.   All other systems reviewed and are negative.  The following is a summary of the past history medically, past history surgically, known current medicines, social history and family history.  This information is gathered electronically by the computer from prior information and documentation.  I review this each visit and have found including this information at this point in the chart is beneficial and informative.    Past Medical History:  Diagnosis Date  . Abdominal pain    RLQ  . Back pain   . Chest pain, unspecified   . Colitis    see psh for colonoscopy reports  . Constipation   . Dyspnea   .  Endometrial polyp 11/09/2012  . Enlarged lymph node    right side - arm  . Family history of ovarian cancer    MGM  . Generalized headaches   . GERD (gastroesophageal reflux disease)   . Hemorrhoids   . Hiatal hernia   . Hip pain   . IBS (irritable bowel syndrome)   . IBS (irritable bowel syndrome)   . Insomnia   . Irregular bleeding 01/01/2013  . Menorrhagia 10/17/2012  . Migraines   . Nausea   . Nephrolithiasis   . Obesity, unspecified   . Palpitations   . PVC (premature ventricular contraction)   . Vitamin D deficiency     Past Surgical History:  Procedure Laterality Date  . ABLATION     Uterine  . COLONOSCOPY  2005   Dr. Irving Shows. Colitis of cecum and ICV, bx nonspecific  . COLONOSCOPY N/A 02/10/2015   Procedure: COLONOSCOPY;  Surgeon: Daneil Dolin, MD;  Location: [facility];  Service: Endoscopy;  Laterality: N/A;  0730-moved to 915 Office to notify  . colonoscopy with ileoscopy  2008   Dr. Laural Golden. patch of coarse mucosa at TI, bx  neg.  Random colon bx showed minimal active focal colitis ?resolving infection. Felt not c/w IBD.  Marland Kitchen DILITATION & CURRETTAGE/HYSTROSCOPY WITH THERMACHOICE ABLATION N/A 12/01/2012   Procedure: DILATATION & CURETTAGE;REMOVAL OF ENDOMETRIAL POLYP; HYSTEROSCOPY WITH THERMACHOICE ENDOMETRIAL ABLATION;  Surgeon: Jonnie Kind, MD;  Location: [facility];  Service: Gynecology;  Laterality: N/A;  . ESOPHAGOGASTRODUODENOSCOPY  02/2008   Dr. Gala Romney- birnak appearing tubular esophagus. small hiatal hernia, o/w normal stomach, D1, D2  . KIDNEY STONE SURGERY    . KNEE ARTHROSCOPY  2008   ACL repair  . LAPAROSCOPIC BILATERAL SALPINGECTOMY Bilateral 12/01/2012   Procedure: LAPAROSCOPIC BILATERAL SALPINGECTOMY ;  Surgeon: Jonnie Kind, MD;  Location: [facility];  Service: Gynecology;  Laterality: Bilateral;  . LESION REMOVAL Left 12/01/2012   Procedure: SKIN TAG REMOVAL (Left inner thigh) ;  Surgeon: Jonnie Kind, MD;  Location: [facility];  Service:  Gynecology;  Laterality: Left;    Family History  Problem Relation Age of Onset  . Cancer Maternal Grandmother        ovarian cancer  . Diabetes Maternal Grandfather   . Scleroderma Mother   . Sleep apnea Mother   . Obesity Mother   . Hypertension Father   . Cancer Father        liver  . Cirrhosis Father        non-alcoholic  . Obesity Father   . Stroke Paternal Grandmother   . Hypertension Paternal Grandmother   . Other Sister        Transverse myelitis  . Colon cancer Neg Hx     Social History Social History   Tobacco Use  . Smoking status: Never Smoker  . Smokeless tobacco: Never Used  Substance Use Topics  . Alcohol use: Yes    Alcohol/week: 0.0 standard drinks    Comment: occ  . Drug use: No    Allergies  Allergen Reactions  . Zanaflex [Tizanidine Hcl] Hives  . Morphine Other (See Comments)    Neck pain  . Oxycodone-Acetaminophen Hives and Itching    Itching    Current Outpatient Medications  Medication Sig Dispense Refill  . albuterol (VENTOLIN HFA) 108 (90 Base) MCG/ACT inhaler     . Biotin 1 MG CAPS Take 1 capsule by mouth as needed (when remembers).     . lidocaine-prilocaine (EMLA) cream Apply 1 application topically as needed. Apply to laser area 30-45 min prior to procedure 30 g 0  . liothyronine (CYTOMEL) 5 MCG tablet Take 5 mcg by mouth daily.    Marland Kitchen OVER THE COUNTER MEDICATION Vitamin C Gummies-Take 1 by mouth daily.    . phentermine 37.5 MG capsule Take 37.5 mg by mouth every morning.    Marland Kitchen VITAMIN D PO Take by mouth daily.    . Vitamins/Minerals TABS Take by mouth.    . cyclobenzaprine (FLEXERIL) 10 MG tablet Take 1 tablet (10 mg total) by mouth at bedtime. One tablet every night at bedtime as needed for spasm. 30 tablet 0  . HYDROcodone-acetaminophen (NORCO/VICODIN) 5-325 MG tablet One tablet every four hours as needed for acute pain.  Limit of five days per [place] statue. 30 tablet 0  . predniSONE (STERAPRED UNI-PAK 21 TAB) 5 MG (21) TBPK  tablet Take 6 pills first day; 5 pills second day; 4 pills third day; 3 pills fourth day; 2 pills next day and 1 pill last day. 21 tablet 0   No current facility-administered medications for this visit.     Physical Exam  Blood pressure 112/74, pulse 83, height 5\' 9"  (1.753 m), weight 249 lb (112.9 kg), last menstrual period 03/07/2019.  Constitutional: overall normal hygiene, normal nutrition, well developed, normal grooming, normal body habitus. Assistive device:none  Musculoskeletal: gait and station Limp  none, muscle tone and strength are normal, no tremors or atrophy is present.  .  Neurological: coordination overall normal.  Deep tendon reflex/nerve stretch intact.  Sensation normal.  Cranial nerves II-XII intact.   Skin:   Normal overall no scars, lesions, ulcers or rashes. No psoriasis.  Psychiatric: Alert and oriented x 3.  Recent memory intact, remote memory unclear.  Normal mood and affect. Well groomed.  Good eye contact.  Cardiovascular: overall no swelling, no varicosities, no edema bilaterally, normal temperatures of the legs and arms, no clubbing, cyanosis and good capillary refill.  Lymphatic: palpation is normal.  Spine/Pelvis examination:  Inspection:  Overall, sacoiliac joint benign and hips nontender; without crepitus or defects.   Thoracic spine inspection: Alignment normal without kyphosis present   Lumbar spine inspection:  Alignment  with normal lumbar lordosis, without scoliosis apparent.   Thoracic spine palpation:  without tenderness of spinal processes   Lumbar spine palpation: without tenderness of lumbar area; without tightness of lumbar muscles    Range of Motion:   Lumbar flexion, forward flexion is normal without pain or tenderness    Lumbar extension is full without pain or tenderness   Left lateral bend is normal without pain or tenderness   Right lateral bend is normal without pain or tenderness   Straight leg raising is normal  Strength  & tone: normal   Stability overall normal stability  All other systems reviewed and are negative   The patient has been educated about the nature of the problem(s) and counseled on treatment options.  The patient appeared to understand what I have discussed and is in agreement with it.  Encounter Diagnosis  Name Primary?  . Acute left-sided low back pain with left-sided sciatica Yes   X-rays were done of the lumbar spine, reported separately.  PLAN Call if any problems.  Precautions discussed.  Continue current medications.   Return to clinic 1 week   Electronically Signed Sanjuana Kava, MD 2/23/202110:08 AM

## 2019-03-15 DIAGNOSIS — G479 Sleep disorder, unspecified: Secondary | ICD-10-CM | POA: Diagnosis not present

## 2019-03-15 DIAGNOSIS — R5382 Chronic fatigue, unspecified: Secondary | ICD-10-CM | POA: Diagnosis not present

## 2019-03-15 DIAGNOSIS — E039 Hypothyroidism, unspecified: Secondary | ICD-10-CM | POA: Diagnosis not present

## 2019-03-15 DIAGNOSIS — Z6836 Body mass index (BMI) 36.0-36.9, adult: Secondary | ICD-10-CM | POA: Diagnosis not present

## 2019-03-21 ENCOUNTER — Ambulatory Visit (HOSPITAL_COMMUNITY): Admission: RE | Admit: 2019-03-21 | Payer: Federal, State, Local not specified - PPO | Source: Ambulatory Visit

## 2019-03-22 ENCOUNTER — Other Ambulatory Visit: Payer: Self-pay

## 2019-03-22 ENCOUNTER — Other Ambulatory Visit: Payer: Self-pay | Admitting: Adult Health

## 2019-03-22 ENCOUNTER — Ambulatory Visit (HOSPITAL_COMMUNITY)
Admission: RE | Admit: 2019-03-22 | Discharge: 2019-03-22 | Disposition: A | Discharge status: Home / Self Care | Payer: Federal, State, Local not specified - PPO | Source: Ambulatory Visit | Attending: Adult Health | Admitting: Adult Health

## 2019-03-22 DIAGNOSIS — R102 Pelvic and perineal pain: Secondary | ICD-10-CM | POA: Insufficient documentation

## 2019-03-22 DIAGNOSIS — D259 Leiomyoma of uterus, unspecified: Secondary | ICD-10-CM | POA: Diagnosis not present

## 2019-03-23 DIAGNOSIS — E0590 Thyrotoxicosis, unspecified without thyrotoxic crisis or storm: Secondary | ICD-10-CM | POA: Diagnosis not present

## 2019-03-23 DIAGNOSIS — Z6835 Body mass index (BMI) 35.0-35.9, adult: Secondary | ICD-10-CM | POA: Diagnosis not present

## 2019-03-29 DIAGNOSIS — E039 Hypothyroidism, unspecified: Secondary | ICD-10-CM | POA: Diagnosis not present

## 2019-03-29 DIAGNOSIS — Z6835 Body mass index (BMI) 35.0-35.9, adult: Secondary | ICD-10-CM | POA: Diagnosis not present

## 2019-04-05 DIAGNOSIS — E039 Hypothyroidism, unspecified: Secondary | ICD-10-CM | POA: Diagnosis not present

## 2019-04-05 DIAGNOSIS — Z6835 Body mass index (BMI) 35.0-35.9, adult: Secondary | ICD-10-CM | POA: Diagnosis not present

## 2019-04-06 ENCOUNTER — Other Ambulatory Visit: Payer: Federal, State, Local not specified - PPO | Admitting: Adult Health

## 2019-04-19 DIAGNOSIS — E039 Hypothyroidism, unspecified: Secondary | ICD-10-CM | POA: Diagnosis not present

## 2019-04-19 DIAGNOSIS — Z6835 Body mass index (BMI) 35.0-35.9, adult: Secondary | ICD-10-CM | POA: Diagnosis not present

## 2019-05-03 DIAGNOSIS — E039 Hypothyroidism, unspecified: Secondary | ICD-10-CM | POA: Diagnosis not present

## 2019-05-03 DIAGNOSIS — Z6836 Body mass index (BMI) 36.0-36.9, adult: Secondary | ICD-10-CM | POA: Diagnosis not present

## 2019-05-24 DIAGNOSIS — E039 Hypothyroidism, unspecified: Secondary | ICD-10-CM | POA: Diagnosis not present

## 2019-05-24 DIAGNOSIS — Z6835 Body mass index (BMI) 35.0-35.9, adult: Secondary | ICD-10-CM | POA: Diagnosis not present

## 2019-06-07 DIAGNOSIS — E039 Hypothyroidism, unspecified: Secondary | ICD-10-CM | POA: Diagnosis not present

## 2019-06-07 DIAGNOSIS — G479 Sleep disorder, unspecified: Secondary | ICD-10-CM | POA: Diagnosis not present

## 2019-06-07 DIAGNOSIS — Z6835 Body mass index (BMI) 35.0-35.9, adult: Secondary | ICD-10-CM | POA: Diagnosis not present

## 2019-06-25 ENCOUNTER — Other Ambulatory Visit (HOSPITAL_COMMUNITY): Payer: Self-pay | Admitting: Adult Health

## 2019-06-25 DIAGNOSIS — Z1231 Encounter for screening mammogram for malignant neoplasm of breast: Secondary | ICD-10-CM

## 2019-06-27 ENCOUNTER — Other Ambulatory Visit: Payer: Self-pay

## 2019-06-27 ENCOUNTER — Ambulatory Visit (HOSPITAL_COMMUNITY)
Admission: RE | Admit: 2019-06-27 | Discharge: 2019-06-27 | Disposition: A | Discharge status: Home / Self Care | Payer: Federal, State, Local not specified - PPO | Source: Ambulatory Visit | Attending: Adult Health | Admitting: Adult Health

## 2019-06-27 DIAGNOSIS — Z1231 Encounter for screening mammogram for malignant neoplasm of breast: Secondary | ICD-10-CM | POA: Diagnosis not present

## 2019-07-03 ENCOUNTER — Other Ambulatory Visit: Payer: Self-pay

## 2019-07-03 ENCOUNTER — Ambulatory Visit: Payer: Federal, State, Local not specified - PPO

## 2019-07-03 ENCOUNTER — Ambulatory Visit (INDEPENDENT_AMBULATORY_CARE_PROVIDER_SITE_OTHER): Payer: Federal, State, Local not specified - PPO | Admitting: Orthopaedic Surgery

## 2019-07-03 ENCOUNTER — Encounter: Payer: Self-pay | Admitting: Orthopaedic Surgery

## 2019-07-03 VITALS — BP 133/80 | HR 77 | Ht 69.0 in | Wt 245.0 lb

## 2019-07-03 DIAGNOSIS — G8929 Other chronic pain: Secondary | ICD-10-CM

## 2019-07-03 DIAGNOSIS — M25562 Pain in left knee: Secondary | ICD-10-CM

## 2019-07-03 MED ORDER — NAPROXEN 500 MG PO TABS: 500.0000 mg | ORAL_TABLET | Freq: Two times a day (BID) | ORAL | 5 refills | Status: DC

## 2019-07-03 NOTE — Progress Notes (Signed)
Patient Meghan [name], female DOB:06-15-1970, 49 y.o. [id number]  Chief Complaint  Patient presents with  . Knee Pain    left     HPI  Meghan [name] is a 49 y.o. female who has had knee pain for several weeks.  She felt a pop in the left knee medially and had pain for about two weeks but it has slowly gotten better.  She could not fully extend it at first but that has improved and she can extend it now.  She has been working.  She has lower back pain also that is irritated with the limp from the knee.  She has no redness, no paresthesias.   Body mass index is 36.18 kg/m.  ROS  Review of Systems  HENT: Negative for congestion.   Respiratory: Negative for cough and shortness of breath.   Cardiovascular: Negative for chest pain and leg swelling.  Endocrine: Negative for cold intolerance.  Musculoskeletal: Positive for arthralgias, back pain and gait problem.  Allergic/Immunologic: Negative for environmental allergies.  Neurological: Negative for numbness.  All other systems reviewed and are negative.   All other systems reviewed and are negative.  The following is a summary of the past history medically, past history surgically, known current medicines, social history and family history.  This information is gathered electronically by the computer from prior information and documentation.  I review this each visit and have found including this information at this point in the chart is beneficial and informative.    Past Medical History:  Diagnosis Date  . Abdominal pain    RLQ  . Back pain   . Chest pain, unspecified   . Colitis    see psh for colonoscopy reports  . Constipation   . Dyspnea   . Endometrial polyp 11/09/2012  . Enlarged lymph node    right side - arm  . Family history of ovarian cancer    MGM  . Generalized headaches   . GERD (gastroesophageal reflux disease)   . Hemorrhoids   . Hiatal hernia   . Hip pain   . IBS (irritable bowel  syndrome)   . IBS (irritable bowel syndrome)   . Insomnia   . Irregular bleeding 01/01/2013  . Menorrhagia 10/17/2012  . Migraines   . Nausea   . Nephrolithiasis   . Obesity, unspecified   . Palpitations   . PVC (premature ventricular contraction)   . Vitamin D deficiency     Past Surgical History:  Procedure Laterality Date  . ABLATION     Uterine  . COLONOSCOPY  2005   Dr. Irving Shows. Colitis of cecum and ICV, bx nonspecific  . COLONOSCOPY N/A 02/10/2015   Procedure: COLONOSCOPY;  Surgeon: Daneil Dolin, MD;  Location: [facility];  Service: Endoscopy;  Laterality: N/A;  0730-moved to 915 Office to notify  . colonoscopy with ileoscopy  2008   Dr. Laural Golden. patch of coarse mucosa at TI, bx  neg.  Random colon bx showed minimal active focal colitis ?resolving infection. Felt not c/w IBD.  Marland Kitchen DILITATION & CURRETTAGE/HYSTROSCOPY WITH THERMACHOICE ABLATION N/A 12/01/2012   Procedure: DILATATION & CURETTAGE;REMOVAL OF ENDOMETRIAL POLYP; HYSTEROSCOPY WITH THERMACHOICE ENDOMETRIAL ABLATION;  Surgeon: Jonnie Kind, MD;  Location: [facility];  Service: Gynecology;  Laterality: N/A;  . ESOPHAGOGASTRODUODENOSCOPY  02/2008   Dr. Gala Romney- birnak appearing tubular esophagus. small hiatal hernia, o/w normal stomach, D1, D2  . KIDNEY STONE SURGERY    . KNEE ARTHROSCOPY  2008   ACL  repair  . LAPAROSCOPIC BILATERAL SALPINGECTOMY Bilateral 12/01/2012   Procedure: LAPAROSCOPIC BILATERAL SALPINGECTOMY ;  Surgeon: Jonnie Kind, MD;  Location: [facility];  Service: Gynecology;  Laterality: Bilateral;  . LESION REMOVAL Left 12/01/2012   Procedure: SKIN TAG REMOVAL (Left inner thigh) ;  Surgeon: Jonnie Kind, MD;  Location: [facility];  Service: Gynecology;  Laterality: Left;    Family History  Problem Relation Age of Onset  . Cancer Maternal Grandmother        ovarian cancer  . Diabetes Maternal Grandfather   . Scleroderma Mother   . Sleep apnea Mother   . Obesity Mother   . Hypertension Father   .  Cancer Father        liver  . Cirrhosis Father        non-alcoholic  . Obesity Father   . Stroke Paternal Grandmother   . Hypertension Paternal Grandmother   . Other Sister        Transverse myelitis  . Colon cancer Neg Hx     Social History Social History   Tobacco Use  . Smoking status: Never Smoker  . Smokeless tobacco: Never Used  Vaping Use  . Vaping Use: Never used  Substance Use Topics  . Alcohol use: Yes    Alcohol/week: 0.0 standard drinks    Comment: occ  . Drug use: No    Allergies  Allergen Reactions  . Zanaflex [Tizanidine Hcl] Hives  . Morphine Other (See Comments)    Neck pain  . Oxycodone-Acetaminophen Hives and Itching    Itching    Current Outpatient Medications  Medication Sig Dispense Refill  . HYDROcodone-acetaminophen (NORCO/VICODIN) 5-325 MG tablet One tablet every four hours as needed for acute pain.  Limit of five days per [place] statue. 30 tablet 0  . liothyronine (CYTOMEL) 5 MCG tablet Take 5 mcg by mouth daily.    . naproxen (NAPROSYN) 500 MG tablet Take 1 tablet (500 mg total) by mouth 2 (two) times daily with a meal. 60 tablet 5   No current facility-administered medications for this visit.     Physical Exam  Blood pressure 133/80, pulse 77, height 5\' 9"  (1.753 m), weight 245 lb (111.1 kg).  Constitutional: overall normal hygiene, normal nutrition, well developed, normal grooming, normal body habitus. Assistive device:none  Musculoskeletal: gait and station Limp none, muscle tone and strength are normal, no tremors or atrophy is present.  .  Neurological: coordination overall normal.  Deep tendon reflex/nerve stretch intact.  Sensation normal.  Cranial nerves II-XII intact.   Skin:   Normal overall no scars, lesions, ulcers or rashes. No psoriasis.  Psychiatric: Alert and oriented x 3.  Recent memory intact, remote memory unclear.  Normal mood and affect. Well groomed.  Good eye contact.  Cardiovascular: overall no swelling,  no varicosities, no edema bilaterally, normal temperatures of the legs and arms, no clubbing, cyanosis and good capillary refill.  Left knee is tender medially but has near full ROM today and no limp, no effusion.  [place] is weakly positive medially.    Lymphatic: palpation is normal.  All other systems reviewed and are negative   The patient has been educated about the nature of the problem(s) and counseled on treatment options.  The patient appeared to understand what I have discussed and is in agreement with it.  Encounter Diagnosis  Name Primary?  . Chronic pain of left knee Yes   I am concerned about possible medial meniscus tear.  I will  begin Naprosyn.  PLAN Call if any problems.  Precautions discussed.  Continue current medications. Begin the naprosyn.  Return to clinic 2 weeks   Consider PT or MRI if pain continues.  Electronically Signed Sanjuana Kava, MD 6/15/202112:01 PM

## 2019-07-05 DIAGNOSIS — N951 Menopausal and female climacteric states: Secondary | ICD-10-CM | POA: Diagnosis not present

## 2019-07-05 DIAGNOSIS — E039 Hypothyroidism, unspecified: Secondary | ICD-10-CM | POA: Diagnosis not present

## 2019-07-05 DIAGNOSIS — G479 Sleep disorder, unspecified: Secondary | ICD-10-CM | POA: Diagnosis not present

## 2019-07-05 DIAGNOSIS — Z6836 Body mass index (BMI) 36.0-36.9, adult: Secondary | ICD-10-CM | POA: Diagnosis not present

## 2019-07-17 ENCOUNTER — Ambulatory Visit: Payer: Federal, State, Local not specified - PPO | Admitting: Orthopaedic Surgery

## 2019-09-06 ENCOUNTER — Ambulatory Visit: Payer: Federal, State, Local not specified - PPO

## 2019-09-06 ENCOUNTER — Encounter: Payer: Self-pay | Admitting: Gastroenterology

## 2019-09-06 ENCOUNTER — Encounter: Payer: Self-pay | Admitting: Orthopaedic Surgery

## 2019-09-06 ENCOUNTER — Other Ambulatory Visit: Payer: Self-pay

## 2019-09-06 ENCOUNTER — Ambulatory Visit (INDEPENDENT_AMBULATORY_CARE_PROVIDER_SITE_OTHER): Payer: Federal, State, Local not specified - PPO | Admitting: Gastroenterology

## 2019-09-06 ENCOUNTER — Ambulatory Visit (INDEPENDENT_AMBULATORY_CARE_PROVIDER_SITE_OTHER): Payer: Federal, State, Local not specified - PPO | Admitting: Orthopaedic Surgery

## 2019-09-06 VITALS — BP 100/86 | HR 91 | Ht 69.0 in | Wt 254.1 lb

## 2019-09-06 DIAGNOSIS — R5383 Other fatigue: Secondary | ICD-10-CM | POA: Diagnosis not present

## 2019-09-06 DIAGNOSIS — M542 Cervicalgia: Secondary | ICD-10-CM | POA: Diagnosis not present

## 2019-09-06 DIAGNOSIS — G8929 Other chronic pain: Secondary | ICD-10-CM

## 2019-09-06 DIAGNOSIS — K921 Melena: Secondary | ICD-10-CM | POA: Diagnosis not present

## 2019-09-06 MED ORDER — PANTOPRAZOLE SODIUM 40 MG PO TBEC: 40.0000 mg | DELAYED_RELEASE_TABLET | Freq: Every day | ORAL | 3 refills | Status: DC

## 2019-09-06 MED ORDER — CYCLOBENZAPRINE HCL 10 MG PO TABS: 10.0000 mg | ORAL_TABLET | Freq: Every day | ORAL | 0 refills | Status: DC

## 2019-09-06 MED ORDER — PREDNISONE 5 MG (21) PO TBPK: ORAL_TABLET | ORAL | 0 refills | Status: DC

## 2019-09-06 NOTE — Progress Notes (Signed)
Primary Care Physician:  Celene Squibb, MD Primary Gastroenterologist:  Dr. Gala Romney   Chief Complaint  Patient presents with  . Blood In Stools    last episode 06/27/19 and it was in stool only    HPI:   Meghan [name] is a 49 y.o. female presenting today with history of chronic GERD, IBS. Remote colonoscopy in 2005 by Dr. Tamala Julian with colitis of cecum and ICV but biopsy non-specific. Colonoscopy 2008 with patch of coarse mucosa at TI but biopsy negative, random colon biopsy showed minimal active focal colitis with question of resolving infection and not felt consistent with IBD. Most recent colonoscopy in 2017 by Dr. Gala Romney with minimal internal hemorrhoids, otherwise normal-appearing rectal mucosa. Normal-appearing colonic mucosa. Distal 10 cm of TI also appeared normal. Segmental biopsies taken benign.   IBS-C historically. Takes stool softener as needed. States she has a rectocele.   Takes Ibuprofen almost daily. 800 mg daily prn: joint pain, neck pain. Going to be started on a steroid.   June 9 saw blood in stool, then saw again that same week. Some intermittent mucus. Last seen was this week. First week of August at [facility], ate brownie batter, had abdominal cramping for 24 hours, nauseated, loose stools with frequency. Wondered if food poisoning. Every once in awhile will have some mild cramping. None this week. No BM with the cramping. BM every day. Has to wipe a lot, symptomatic hemorrhoids, no pain. Grade 2.   Lost 50 lbs and gained 8 lbs back. Feels exhausted, hunger pains all the time, if eats feels better. Present since December. Peanut butter or rice: gets hung up. No significant GERD symptoms. Doesn't have to take anything for it.   [name] MD. Yoga. HRT therapy. Outside labs from [facility] MD on phone today from Dec 2020: Ferritin 74, Hgb 14.4    Past Medical History:  Diagnosis Date  . Abdominal pain    RLQ  . Back pain   . Chest pain, unspecified   . Colitis    see  psh for colonoscopy reports  . Constipation   . Dyspnea   . Endometrial polyp 11/09/2012  . Enlarged lymph node    right side - arm  . Family history of ovarian cancer    MGM  . Generalized headaches   . GERD (gastroesophageal reflux disease)   . Hemorrhoids   . Hiatal hernia   . Hip pain   . IBS (irritable bowel syndrome)   . IBS (irritable bowel syndrome)   . Insomnia   . Irregular bleeding 01/01/2013  . Menorrhagia 10/17/2012  . Migraines   . Nausea   . Nephrolithiasis   . Obesity, unspecified   . Palpitations   . PVC (premature ventricular contraction)   . Vitamin D deficiency     Past Surgical History:  Procedure Laterality Date  . ABLATION     Uterine  . COLONOSCOPY  2005   Dr. Irving Shows. Colitis of cecum and ICV, bx nonspecific  . COLONOSCOPY N/A 02/10/2015   Minimal internal hemorrhoids, otherwise normal-appearing rectal mucosa. Normal-appearing colonic mucosa. Distal 10 cm of TI also appeared normal. Segmental biopsies taken benign.   . colonoscopy with ileoscopy  2008   Dr. Laural Golden. patch of coarse mucosa at TI, bx  neg.  Random colon bx showed minimal active focal colitis ?resolving infection. Felt not c/w IBD.  Marland Kitchen DILITATION & CURRETTAGE/HYSTROSCOPY WITH THERMACHOICE ABLATION N/A 12/01/2012   Procedure: DILATATION & CURETTAGE;REMOVAL OF ENDOMETRIAL  POLYP; HYSTEROSCOPY WITH THERMACHOICE ENDOMETRIAL ABLATION;  Surgeon: Jonnie Kind, MD;  Location: [facility];  Service: Gynecology;  Laterality: N/A;  . ESOPHAGOGASTRODUODENOSCOPY  02/2008   Dr. Gala Romney- birnak appearing tubular esophagus. small hiatal hernia, o/w normal stomach, D1, D2  . KIDNEY STONE SURGERY    . KNEE ARTHROSCOPY  2008   ACL repair  . LAPAROSCOPIC BILATERAL SALPINGECTOMY Bilateral 12/01/2012   Procedure: LAPAROSCOPIC BILATERAL SALPINGECTOMY ;  Surgeon: Jonnie Kind, MD;  Location: [facility];  Service: Gynecology;  Laterality: Bilateral;  . LESION REMOVAL Left 12/01/2012   Procedure: SKIN TAG REMOVAL  (Left inner thigh) ;  Surgeon: Jonnie Kind, MD;  Location: [facility];  Service: Gynecology;  Laterality: Left;    Current Outpatient Medications  Medication Sig Dispense Refill  . cyclobenzaprine (FLEXERIL) 10 MG tablet Take 1 tablet (10 mg total) by mouth at bedtime. One tablet every night at bedtime as needed for spasm. 30 tablet 0  . HYDROcodone-acetaminophen (NORCO/VICODIN) 5-325 MG tablet One tablet every four hours as needed for acute pain.  Limit of five days per [place]. 30 tablet 0  . liothyronine (CYTOMEL) 5 MCG tablet Take 5 mcg by mouth daily.    . predniSONE (STERAPRED UNI-PAK 21 TAB) 5 MG (21) TBPK tablet Take 6 pills first day; 5 pills second day; 4 pills third day; 3 pills fourth day; 2 pills next day and 1 pill last day. 21 tablet 0   No current facility-administered medications for this visit.    Allergies as of 09/06/2019 - Review Complete 09/06/2019  Allergen Reaction Noted  . Zanaflex [tizanidine hcl] Hives 01/07/2017  . Morphine Other (See Comments) 08/01/2013  . Oxycodone-acetaminophen Hives and Itching 12/01/2012    Family History  Problem Relation Age of Onset  . Cancer Maternal Grandmother        ovarian cancer  . Diabetes Maternal Grandfather   . Scleroderma Mother   . Sleep apnea Mother   . Obesity Mother   . Hypertension Father   . Cancer Father        liver  . Cirrhosis Father        non-alcoholic  . Obesity Father   . Stroke Paternal Grandmother   . Hypertension Paternal Grandmother   . Other Sister        Transverse myelitis  . Colon cancer Neg Hx   . Colon polyps Neg Hx     Social History   Socioeconomic History  . Marital status: Married    Spouse name: Keenan Bachelor  . Number of children: 2  . Years of education: 27  . Highest education level: Not on file  Occupational History  . Occupation: radiology CT Archivist    Employer: [employer]    Comment: ct tech at [employer]  . Occupation: [employer]    Employer:  [place]  Tobacco Use  . Smoking status: Never Smoker  . Smokeless tobacco: Never Used  Vaping Use  . Vaping Use: Never used  Substance and Sexual Activity  . Alcohol use: Yes    Alcohol/week: 0.0 standard drinks    Comment: occ  . Drug use: No  . Sexual activity: Yes    Birth control/protection: None, Other-see comments, Surgical    Comment: vasectomy; ablation  Other Topics Concern  . Not on file  Social History Narrative   Patient is married Keenan Bachelor) and lives at home with her husband and two children.   Patient works full-time at [employer].  Patient has a Associates degree.   Patient is right-handed.   Patient drinks tea occasionally.      Social Determinants of Health   Financial Resource Strain:   . Difficulty of Paying Living Expenses: Not on file  Food Insecurity:   . Worried About Charity fundraiser in the Last Year: Not on file  . Ran Out of Food in the Last Year: Not on file  Transportation Needs:   . Lack of Transportation (Medical): Not on file  . Lack of Transportation (Non-Medical): Not on file  Physical Activity:   . Days of Exercise per Week: Not on file  . Minutes of Exercise per Session: Not on file  Stress:   . Feeling of Stress : Not on file  Social Connections:   . Frequency of Communication with Friends and Family: Not on file  . Frequency of Social Gatherings with Friends and Family: Not on file  . Attends Religious Services: Not on file  . Active Member of Clubs or Organizations: Not on file  . Attends Archivist Meetings: Not on file  . Marital Status: Not on file  Intimate Partner Violence:   . Fear of Current or Ex-Partner: Not on file  . Emotionally Abused: Not on file  . Physically Abused: Not on file  . Sexually Abused: Not on file    Review of Systems: Gen: Denies any fever, chills, fatigue, weight loss, lack of appetite.  CV: Denies chest pain, heart palpitations, peripheral edema, syncope.  Resp: Denies shortness  of breath at rest or with exertion. Denies wheezing or cough.  GI: see HPI GU : Denies urinary burning, urinary frequency, urinary hesitancy MS: Denies joint pain, muscle weakness, cramps, or limitation of movement.  Derm: Denies rash, itching, dry skin Psych: Denies depression, anxiety, memory loss, and confusion Heme: see HPI  Physical Exam: BP 112/74   Pulse 74   Temp (!) 97.3 F (36.3 C)   Ht 5\' 9"  (1.753 m)   Wt 255 lb 6.4 oz (115.8 kg)   BMI 37.72 kg/m  General:   Alert and oriented. Pleasant and cooperative. Well-nourished and well-developed.  Head:  Normocephalic and atraumatic. Eyes:  Without icterus, sclera clear and conjunctiva pink.  Ears:  Normal auditory acuity. Mouth:  Mask in place Lungs:  Clear to auscultation bilaterally. No wheezes, rales, or rhonchi. No distress.  Heart:  S1, S2 present without murmurs appreciated.  Abdomen:  +BS, soft, non-tender and non-distended. No HSM noted. No guarding or rebound. No masses appreciated.  Rectal:  Deferred  Msk:  Symmetrical without gross deformities. Normal posture. Extremities:  Without edema. Neurologic:  Alert and  oriented x4;  grossly normal neurologically. Skin:  Intact without significant lesions or rashes. Psych:  Alert and cooperative. Normal mood and affect.  ASSESSMENT: NAZYIA GAUGH is a 49 y.o. female presenting today with history of chronic GERD, IBS, recently with blood in stool on two separate occasions.   Likely benign anorectal source, known hemorrhoids, but she has had an interesting history of remote colonoscopy in 2005 by Dr. Tamala Julian with colitis of cecum and ICV but biopsy non-specific. Colonoscopy 2008 with patch of coarse mucosa at TI but biopsy negative, random colon biopsy showed minimal active focal colitis with question of resolving infection and not felt consistent with IBD. Most recent colonoscopy in 2017 by Dr. Gala Romney with minimal internal hemorrhoids, otherwise normal-appearing rectal  mucosa. Normal-appearing colonic mucosa. Distal 10 cm of TI also appeared normal. Segmental biopsies taken  benign. We discussed updating colonoscopy now, but she would like to hold off and monitor first. We will update labs today to ensure stable CBC.   We also discussed hemorrhoid banding in future, but she is holding off currently. Would be a good candidate for this as long as we exclude occult etiology for rectal bleeding.   Notes "hunger pains" with improvement after eating; also endorses daily NSAIDs for arthralgias. Will start on a PPI, especially in light of NSAIDs. If no improvement, needs EGD.    PLAN:  Check CBC, iron studies  Start Benefiber daily and increase as tolerated  Call if further bleeding: recommend colonoscopy but she desires conservative approach and clinically monitoring for now  Start Protonix once daily  Annitta Needs, PhD, [facility]astroenter[facility]

## 2019-09-06 NOTE — Progress Notes (Signed)
Patient YQ:[id number] Meghan [name], female DOB:Jul 10, 1970, 49 y.o. EXB:[id number]  Chief Complaint  Patient presents with  . Neck Pain    L/ no relief for 1 month now/hurts in back when turning to left    HPI  Meghan [name] is a 49 y.o. female who has had neck pain for about a month now that is not getting better.  She has more pain turning her head to the right.  She has no weakness, no paresthesias.  She has tried naprosyn, heat, ice, rubs with no help.  She even tried a pain pill she had left over from the past with another problem and it did not help.  She has problems sleeping and moving her head now.     Body mass index is 37.53 kg/m.  ROS  Review of Systems  HENT: Negative for congestion.   Respiratory: Negative for cough and shortness of breath.   Cardiovascular: Negative for chest pain and leg swelling.  Endocrine: Negative for cold intolerance.  Musculoskeletal: Positive for arthralgias, back pain and gait problem.  Allergic/Immunologic: Negative for environmental allergies.  Neurological: Negative for numbness.  All other systems reviewed and are negative.   All other systems reviewed and are negative.  The following is a summary of the past history medically, past history surgically, known current medicines, social history and family history.  This information is gathered electronically by the computer from prior information and documentation.  I review this each visit and have found including this information at this point in the chart is beneficial and informative.    Past Medical History:  Diagnosis Date  . Abdominal pain    RLQ  . Back pain   . Chest pain, unspecified   . Colitis    see [facility] for colonoscopy reports  . Constipation   . Dyspnea   . Endometrial polyp 11/09/2012  . Enlarged lymph node    right side - arm  . Family history of ovarian cancer    MGM  . Generalized headaches   . GERD (gastroesophageal reflux disease)   . Hemorrhoids   . Hiatal  hernia   . Hip pain   . IBS (irritable bowel syndrome)   . IBS (irritable bowel syndrome)   . Insomnia   . Irregular bleeding 01/01/2013  . Menorrhagia 10/17/2012  . Migraines   . Nausea   . Nephrolithiasis   . Obesity, unspecified   . Palpitations   . PVC (premature ventricular contraction)   . Vitamin D deficiency     Past Surgical History:  Procedure Laterality Date  . ABLATION     Uterine  . COLONOSCOPY  2005   Dr. Irving Shows. Colitis of cecum and ICV, bx nonspecific  . COLONOSCOPY N/A 02/10/2015   Procedure: COLONOSCOPY;  Surgeon: Daneil Dolin, MD;  Location: [facility];  Service: Endoscopy;  Laterality: N/A;  0730-moved to 915 Office to notify  . colonoscopy with ileoscopy  2008   Dr. Laural Golden. patch of coarse mucosa at TI, bx  neg.  Random colon bx showed minimal active focal colitis ?resolving infection. Felt not c/w IBD.  Marland Kitchen DILITATION & CURRETTAGE/HYSTROSCOPY WITH THERMACHOICE ABLATION N/A 12/01/2012   Procedure: DILATATION & CURETTAGE;REMOVAL OF ENDOMETRIAL POLYP; HYSTEROSCOPY WITH THERMACHOICE ENDOMETRIAL ABLATION;  Surgeon: Jonnie Kind, MD;  Location: [facility];  Service: Gynecology;  Laterality: N/A;  . ESOPHAGOGASTRODUODENOSCOPY  02/2008   Dr. Gala Romney- birnak appearing tubular esophagus. small hiatal hernia, o/w normal stomach, D1, D2  . KIDNEY STONE SURGERY    .  KNEE ARTHROSCOPY  2008   ACL repair  . LAPAROSCOPIC BILATERAL SALPINGECTOMY Bilateral 12/01/2012   Procedure: LAPAROSCOPIC BILATERAL SALPINGECTOMY ;  Surgeon: Jonnie Kind, MD;  Location: [facility];  Service: Gynecology;  Laterality: Bilateral;  . LESION REMOVAL Left 12/01/2012   Procedure: SKIN TAG REMOVAL (Left inner thigh) ;  Surgeon: Jonnie Kind, MD;  Location: [facility];  Service: Gynecology;  Laterality: Left;    Family History  Problem Relation Age of Onset  . Cancer Maternal Grandmother        ovarian cancer  . Diabetes Maternal Grandfather   . Scleroderma Mother   . Sleep apnea Mother   .  Obesity Mother   . Hypertension Father   . Cancer Father        liver  . Cirrhosis Father        non-alcoholic  . Obesity Father   . Stroke Paternal Grandmother   . Hypertension Paternal Grandmother   . Other Sister        Transverse myelitis  . Colon cancer Neg Hx     Social History Social History   Tobacco Use  . Smoking status: Never Smoker  . Smokeless tobacco: Never Used  Vaping Use  . Vaping Use: Never used  Substance Use Topics  . Alcohol use: Yes    Alcohol/week: 0.0 standard drinks    Comment: occ  . Drug use: No    Allergies  Allergen Reactions  . Zanaflex [Tizanidine Hcl] Hives  . Morphine Other (See Comments)    Neck pain  . Oxycodone-Acetaminophen Hives and Itching    Itching    Current Outpatient Medications  Medication Sig Dispense Refill  . HYDROcodone-acetaminophen (NORCO/VICODIN) 5-325 MG tablet One tablet every four hours as needed for acute pain.  Limit of five days per  statue. 30 tablet 0  . liothyronine (CYTOMEL) 5 MCG tablet Take 5 mcg by mouth daily.    . naproxen (NAPROSYN) 500 MG tablet Take 1 tablet (500 mg total) by mouth 2 (two) times daily with a meal. 60 tablet 5   No current facility-administered medications for this visit.     Physical Exam  Blood pressure 100/86, pulse 91, height 5\' 9"  (1.753 m), weight 254 lb 2 oz (115.3 kg).  Constitutional: overall normal hygiene, normal nutrition, well developed, normal grooming, normal body habitus. Assistive device:none  Musculoskeletal: gait and station Limp none, muscle tone and strength are normal, no tremors or atrophy is present.  .  Neurological: coordination overall normal.  Deep tendon reflex/nerve stretch intact.  Sensation normal.  Cranial nerves II-XII intact.   Skin:   Normal overall no scars, lesions, ulcers or rashes. No psoriasis.  Psychiatric: Alert and oriented x 3.  Recent memory intact, remote memory unclear.  Normal mood and affect. Well groomed.  Good  eye contact.  Cardiovascular: overall no swelling, no varicosities, no edema bilaterally, normal temperatures of the legs and arms, no clubbing, cyanosis and good capillary refill.  Neck has good ROM but tender to the right.  No spasm.  NV intact.    Lymphatic: palpation is normal.  All other systems reviewed and are negative   The patient has been educated about the nature of the problem(s) and counseled on treatment options.  The patient appeared to understand what I have discussed and is in agreement with it.  X-rays were done of the cervical spine, reported separately.  Encounter Diagnosis  Name Primary?  . Chronic neck pain Yes  PLAN Call if any problems.  Precautions discussed. Begin prednisone dose pack, stop the Naprosyn while on the prednisone, begin Flexeril.  Return to clinic 2 weeks  I have recommended neck massage.  If not improved, she may need MRI.  Electronically Signed Sanjuana Kava, MD 8/19/20218:53 AM

## 2019-09-06 NOTE — Patient Instructions (Signed)
We will start off with labs for now: you can have this done at [facility].  I recommend Benefiber 2 teaspoons daily, and you can increase as tolerated if needed.  Let me know if further rectal bleeding or symptomatic hemorrhoids!  For now, take Protonix on an empty stomach once a day 30 minutes before eating while you are on the prednisone. We may want to consider this taking on days you take the Ibuprofen as well. If you have continued upper abdominal discomfort, let me know.  It was a pleasure to see you today. I want to create trusting relationships with patients to provide genuine, compassionate, and quality care. I value your feedback. If you receive a survey regarding your visit,  I greatly appreciate you taking time to fill this out.   Annitta Needs, PhD, ANP-BC [facility]

## 2019-09-07 ENCOUNTER — Ambulatory Visit: Payer: Federal, State, Local not specified - PPO | Admitting: Gastroenterology

## 2019-09-24 ENCOUNTER — Encounter: Payer: Self-pay | Admitting: Orthopaedic Surgery

## 2019-09-24 DIAGNOSIS — M5442 Lumbago with sciatica, left side: Secondary | ICD-10-CM

## 2019-09-26 ENCOUNTER — Ambulatory Visit (HOSPITAL_COMMUNITY)
Admission: RE | Admit: 2019-09-26 | Discharge: 2019-09-26 | Disposition: A | Discharge status: Home / Self Care | Payer: Federal, State, Local not specified - PPO | Source: Ambulatory Visit | Attending: Orthopaedic Surgery | Admitting: Orthopaedic Surgery

## 2019-09-26 ENCOUNTER — Other Ambulatory Visit: Payer: Self-pay

## 2019-09-26 DIAGNOSIS — M4727 Other spondylosis with radiculopathy, lumbosacral region: Secondary | ICD-10-CM | POA: Diagnosis not present

## 2019-09-26 DIAGNOSIS — M5115 Intervertebral disc disorders with radiculopathy, thoracolumbar region: Secondary | ICD-10-CM | POA: Diagnosis not present

## 2019-09-26 DIAGNOSIS — F909 Attention-deficit hyperactivity disorder, unspecified type: Secondary | ICD-10-CM | POA: Diagnosis not present

## 2019-09-26 DIAGNOSIS — M4726 Other spondylosis with radiculopathy, lumbar region: Secondary | ICD-10-CM | POA: Diagnosis not present

## 2019-09-26 DIAGNOSIS — M542 Cervicalgia: Secondary | ICD-10-CM | POA: Diagnosis not present

## 2019-09-26 DIAGNOSIS — M545 Low back pain: Secondary | ICD-10-CM | POA: Diagnosis not present

## 2019-09-26 DIAGNOSIS — M5442 Lumbago with sciatica, left side: Secondary | ICD-10-CM | POA: Insufficient documentation

## 2019-09-26 DIAGNOSIS — F411 Generalized anxiety disorder: Secondary | ICD-10-CM | POA: Diagnosis not present

## 2019-09-26 DIAGNOSIS — M5116 Intervertebral disc disorders with radiculopathy, lumbar region: Secondary | ICD-10-CM | POA: Diagnosis not present

## 2019-09-26 MED ORDER — PREDNISONE 5 MG (21) PO TBPK
ORAL_TABLET | ORAL | 0 refills | Status: DC
Start: 2019-09-26 — End: 2020-01-29

## 2019-10-24 ENCOUNTER — Ambulatory Visit: Payer: Federal, State, Local not specified - PPO | Admitting: Gastroenterology

## 2019-11-09 ENCOUNTER — Other Ambulatory Visit: Payer: Self-pay | Admitting: Gastroenterology

## 2019-11-09 DIAGNOSIS — K921 Melena: Secondary | ICD-10-CM | POA: Diagnosis not present

## 2019-11-09 DIAGNOSIS — K219 Gastro-esophageal reflux disease without esophagitis: Secondary | ICD-10-CM

## 2019-11-09 DIAGNOSIS — R5383 Other fatigue: Secondary | ICD-10-CM | POA: Diagnosis not present

## 2019-11-09 DIAGNOSIS — R590 Localized enlarged lymph nodes: Secondary | ICD-10-CM

## 2019-11-10 LAB — IRON,TIBC AND FERRITIN PANEL
%SAT: 17 % (calc) (ref 16–45)
Ferritin: 21 ng/mL (ref 16–232)
Iron: 59 ug/dL (ref 40–190)
TIBC: 340 mcg/dL (calc) (ref 250–450)

## 2019-11-10 LAB — COMPLETE METABOLIC PANEL WITH GFR
AG Ratio: 2.2 (calc) (ref 1.0–2.5)
ALT: 18 U/L (ref 6–29)
AST: 37 U/L — ABNORMAL HIGH (ref 10–35)
Albumin: 3.7 g/dL (ref 3.6–5.1)
Alkaline phosphatase (APISO): 51 U/L (ref 31–125)
BUN: 17 mg/dL (ref 7–25)
CO2: 26 mmol/L (ref 20–32)
Calcium: 9.1 mg/dL (ref 8.6–10.2)
Chloride: 106 mmol/L (ref 98–110)
Creat: 0.76 mg/dL (ref 0.50–1.10)
GFR, Est African American: 109 mL/min/{1.73_m2} (ref >=60)
GFR, Est Non African American: 94 mL/min/{1.73_m2} (ref >=60)
Globulin: 1.7 g/dL (calc) — ABNORMAL LOW (ref 1.9–3.7)
Glucose, Bld: 105 mg/dL (ref 65–139)
Potassium: 4.1 mmol/L (ref 3.5–5.3)
Sodium: 139 mmol/L (ref 135–146)
Total Bilirubin: 0.4 mg/dL (ref 0.2–1.2)
Total Protein: 5.4 g/dL — ABNORMAL LOW (ref 6.1–8.1)

## 2019-11-10 LAB — CBC WITH DIFFERENTIAL/PLATELET
Absolute Monocytes: 636 cells/uL (ref 200–950)
Basophils Absolute: 52 cells/uL (ref 0–200)
Basophils Relative: 0.7 %
Eosinophils Absolute: 89 cells/uL (ref 15–500)
Eosinophils Relative: 1.2 %
HCT: 43.5 % (ref 35.0–45.0)
Hemoglobin: 14.3 g/dL (ref 11.7–15.5)
Lymphs Abs: 1998 cells/uL (ref 850–3900)
MCH: 28.1 pg (ref 27.0–33.0)
MCHC: 32.9 g/dL (ref 32.0–36.0)
MCV: 85.6 fL (ref 80.0–100.0)
MPV: 10.5 fL (ref 7.5–12.5)
Monocytes Relative: 8.6 %
Neutro Abs: 4625 cells/uL (ref 1500–7800)
Neutrophils Relative %: 62.5 %
Platelets: 275 10*3/uL (ref 140–400)
RBC: 5.08 10*6/uL (ref 3.80–5.10)
RDW: 13 % (ref 11.0–15.0)
Total Lymphocyte: 27 %
WBC: 7.4 10*3/uL (ref 3.8–10.8)

## 2019-11-12 ENCOUNTER — Other Ambulatory Visit: Payer: Self-pay

## 2019-11-12 DIAGNOSIS — R779 Abnormality of plasma protein, unspecified: Secondary | ICD-10-CM

## 2019-11-12 DIAGNOSIS — K921 Melena: Secondary | ICD-10-CM

## 2019-12-31 ENCOUNTER — Other Ambulatory Visit: Payer: Self-pay

## 2019-12-31 DIAGNOSIS — R779 Abnormality of plasma protein, unspecified: Secondary | ICD-10-CM

## 2019-12-31 DIAGNOSIS — K921 Melena: Secondary | ICD-10-CM

## 2020-01-01 ENCOUNTER — Ambulatory Visit: Payer: Federal, State, Local not specified - PPO | Admitting: Orthopaedic Surgery

## 2020-01-15 ENCOUNTER — Ambulatory Visit
Admission: EM | Admit: 2020-01-15 | Discharge: 2020-01-15 | Disposition: A | Discharge status: Home / Self Care | Payer: Federal, State, Local not specified - PPO | Attending: Physician Assistant | Admitting: Physician Assistant

## 2020-01-15 ENCOUNTER — Other Ambulatory Visit: Payer: Self-pay

## 2020-01-15 DIAGNOSIS — Z20822 Contact with and (suspected) exposure to covid-19: Secondary | ICD-10-CM

## 2020-01-15 DIAGNOSIS — J069 Acute upper respiratory infection, unspecified: Secondary | ICD-10-CM

## 2020-01-15 LAB — POCT INFLUENZA A/B
Influenza A, POC: NEGATIVE
Influenza B, POC: NEGATIVE

## 2020-01-15 NOTE — ED Provider Notes (Signed)
[facility]    CSN: [id number] Arrival date & time: 01/15/20  [id number]      History   Chief Complaint Chief Complaint  Patient presents with  . Generalized Body Aches    HPI Meghan [name] is a 49 y.o. female.   The history is provided by the patient. No language interpreter was used.  Fever Temp source:  Subjective Severity:  Moderate Onset quality:  Gradual Duration:  2 days Timing:  Constant Progression:  Worsening Relieved by:  Nothing Worsened by:  Nothing Pt complains of cough and fever.  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  Exposure to covid and influenza.   Past Medical History:  Diagnosis Date  . Abdominal pain    RLQ  . Back pain   . Chest pain, unspecified   . Colitis    see psh for colonoscopy reports  . Constipation   . Dyspnea   . Endometrial polyp 11/09/2012  . Enlarged lymph node    right side - arm  . Family history of ovarian cancer    MGM  . Generalized headaches   . GERD (gastroesophageal reflux disease)   . Hemorrhoids   . Hiatal hernia   . Hip pain   . IBS (irritable bowel syndrome)   . IBS (irritable bowel syndrome)   . Insomnia   . Irregular bleeding 01/01/2013  . Menorrhagia 10/17/2012  . Migraines   . Nausea   . Nephrolithiasis   . Obesity, unspecified   . Palpitations   . PVC (premature ventricular contraction)   . Vitamin D deficiency     Patient Active Problem List   Diagnosis Date Noted  . Blood in stool 09/06/2019  . Fatigue 09/06/2019  . Irregular menstrual cycle 03/09/2019  . Pelvic pain 03/09/2019  . Proteinuria 03/09/2019  . Hematuria 03/09/2019  . Changing skin lesion 02/10/2018  . Seborrheic keratoses 02/10/2018  . Encounter for counseling 01/07/2017  . Irregular heart beat 01/07/2017  . 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 Stress headaches 06/23/2015  . Excessive daytime sleepiness 06/23/2015  . Morbid obesity due to excess calories (HCC) 06/23/2015  . Change in bowel habits   . Heme + stool   . Heme positive  stool 12/20/2014  . Bowel habit changes 12/20/2014  . Dysesthesia of multiple sites 08/29/2013  . Migraine without aura and without status migrainosus, not intractable 08/01/2013  . Irregular bleeding 01/01/2013  . Endometrial polyp 11/09/2012  . Menorrhagia 10/17/2012  . Upper abdominal pain 10/06/2011  . GERD (gastroesophageal reflux disease) 08/21/2010  . Abdominal pain 08/21/2010  . Cervical lymphadenopathy 08/21/2010  . Localized swelling, mass, or lump of upper extremity 08/21/2010  . Low back pain radiating to both legs 08/19/2010  . Neck pain, chronic 08/19/2010  . Numbness and tingling in hands 08/19/2010  . PREMATURE VENTRICULAR CONTRACTIONS 07/17/2008  . PALPITATIONS 07/17/2008  . DYSPNEA 07/17/2008  . CHEST PAIN-UNSPECIFIED 07/17/2008  . OBESITY 09/21/2007  . COLITIS 09/21/2007  . CONSTIPATION 09/21/2007  . IBS 09/21/2007  . NAUSEA 09/21/2007  . Diarrhea 09/21/2007  . Abdominal pain 09/21/2007    Past Surgical History:  Procedure Laterality Date  . ABLATION     Uterine  . COLONOSCOPY  2005   Dr. Elpidio AnisLeroy Smith. Colitis of cecum and ICV, bx nonspecific  . COLONOSCOPY N/A 02/10/2015   Minimal internal hemorrhoids, otherwise normal-appearing rectal mucosa. Normal-appearing colonic mucosa. Distal 10 cm of TI also appeared normal. Segmental biopsies taken benign.   . colonoscopy with ileoscopy  2008  Dr. Karilyn Cota. patch of coarse mucosa at TI, bx  neg.  Random colon bx showed minimal active focal colitis ?resolving infection. Felt not c/w IBD.  Marland Kitchen DILITATION & CURRETTAGE/HYSTROSCOPY WITH THERMACHOICE ABLATION N/A 12/01/2012   Procedure: DILATATION & CURETTAGE;REMOVAL OF ENDOMETRIAL POLYP; HYSTEROSCOPY WITH THERMACHOICE ENDOMETRIAL ABLATION;  Surgeon: [name], MD;  Location: [facility];  Service: Gynecology;  Laterality: N/A;  . ESOPHAGOGASTRODUODENOSCOPY  02/2008   Dr. Jena Gauss- birnak appearing tubular esophagus. small hiatal hernia, o/w normal stomach, D1, D2  . KIDNEY  STONE SURGERY    . KNEE ARTHROSCOPY  2008   ACL repair  . LAPAROSCOPIC BILATERAL SALPINGECTOMY Bilateral 12/01/2012   Procedure: LAPAROSCOPIC BILATERAL SALPINGECTOMY ;  Surgeon: [name], MD;  Location: [facility];  Service: Gynecology;  Laterality: Bilateral;  . LESION REMOVAL Left 12/01/2012   Procedure: SKIN TAG REMOVAL (Left inner thigh) ;  Surgeon: [name], MD;  Location: [facility];  Service: Gynecology;  Laterality: Left;    OB History    Gravida  2   Para  2   Term  2   Preterm      AB      Living  2     SAB      IAB      Ectopic      Multiple      Live Births  2            Home Medications    Prior to Admission medications   Medication Sig Start Date End Date Taking? Authorizing Provider  cyclobenzaprine (FLEXERIL) 10 MG tablet Take 1 tablet (10 mg total) by mouth at bedtime. One tablet every night at bedtime as needed for spasm. 09/06/19   Darreld Mclean, MD  HYDROcodone-acetaminophen (NORCO/VICODIN) 5-325 MG tablet One tablet every four hours as needed for acute pain.  Limit of five days per [place]. 03/13/19   Darreld Mclean, MD  liothyronine (CYTOMEL) 5 MCG tablet Take 5 mcg by mouth daily. 03/06/19   [provider]  pantoprazole (PROTONIX) 40 MG tablet Take 1 tablet (40 mg total) by mouth daily. 30 minutes before breakfast 09/06/19   Gelene Mink, NP  predniSONE (STERAPRED UNI-PAK 21 TAB) 5 MG (21) TBPK tablet Take 6 pills first day; 5 pills second day; 4 pills third day; 3 pills fourth day; 2 pills next day and 1 pill last day. 09/26/19   Darreld Mclean, MD    Family History Family History  Problem Relation Age of Onset  . Cancer Maternal Grandmother        ovarian cancer  . Diabetes Maternal Grandfather   . Scleroderma Mother   . Sleep apnea Mother   . Obesity Mother   . Hypertension Father   . Cancer Father        liver  . Cirrhosis Father        non-alcoholic  . Obesity Father   . Stroke Paternal Grandmother   .  Hypertension Paternal Grandmother   . Other Sister        Transverse myelitis  . Colon cancer Neg Hx   . Colon polyps Neg Hx     Social History Social History   Tobacco Use  . Smoking status: Never Smoker  . Smokeless tobacco: Never Used  Vaping Use  . Vaping Use: Never used  Substance Use Topics  . Alcohol use: Yes    Alcohol/week: 0.0 standard drinks    Comment: occ  . Drug use: No  Allergies   Zanaflex [tizanidine hcl], Morphine, and Oxycodone-acetaminophen   Review of Systems Review of Systems  Constitutional: Positive for fever.  All other systems reviewed and are negative.    Physical Exam Triage Vital Signs ED Triage Vitals  Enc Vitals Group     BP 01/15/20 1025 115/79     Pulse Rate 01/15/20 1025 87     Resp 01/15/20 1025 15     Temp 01/15/20 1025 98.2 F (36.8 C)     Temp src --      SpO2 01/15/20 1025 97 %     Weight --      Height --      Head Circumference --      Peak Flow --      Pain Score 01/15/20 1034 7     Pain Loc --      Pain Edu? --      Excl. in [place]? --    No data found.  Updated Vital Signs BP 115/79   Pulse 87   Temp 98.2 F (36.8 C)   Resp 15   SpO2 97%   Visual Acuity Right Eye Distance:   Left Eye Distance:   Bilateral Distance:    Right Eye Near:   Left Eye Near:    Bilateral Near:     Physical Exam Vitals and nursing note reviewed.  Constitutional:      Appearance: She is well-developed and well-nourished.  HENT:     Head: Normocephalic.     Nose: Nose normal.     Mouth/Throat:     Mouth: Mucous membranes are moist.  Eyes:     Extraocular Movements: EOM normal.     Pupils: Pupils are equal, round, and reactive to light.  Cardiovascular:     Rate and Rhythm: Normal rate.     Pulses: Normal pulses.  Pulmonary:     Effort: Pulmonary effort is normal.  Abdominal:     General: There is no distension.  Musculoskeletal:        General: Normal range of motion.     Cervical back: Normal range of  motion.  Skin:    General: Skin is warm.  Neurological:     General: No focal deficit present.     Mental Status: She is alert and oriented to person, place, and time.  Psychiatric:        Mood and Affect: Mood and affect and mood normal.      UC Treatments / Results  Labs (all labs ordered are listed, but only abnormal results are displayed) Labs Reviewed  NOVEL CORONAVIRUS, NAA  POCT INFLUENZA A/B    EKG   Radiology No results found.  Procedures Procedures (including critical care time)  Medications Ordered in UC Medications - No data to display  Initial Impression / Assessment and Plan / UC Course  I have reviewed the triage vital signs and the nursing notes.  Pertinent labs & imaging results that were available during my care of the patient were reviewed by me and considered in my medical decision making (see chart for details).     INFluenza negative, covid pending.   Final Clinical Impressions(s) / UC Diagnoses   Final diagnoses:  Suspected 2019-nCoV infection  Viral URI     Discharge Instructions     Covid is pending    ED Prescriptions    None     PDMP not reviewed this encounter.  An After Visit Summary was printed and given to the  patient.    Fransico Meadow, [place] 01/15/20 1126

## 2020-01-15 NOTE — Discharge Instructions (Addendum)
Covid is pending

## 2020-01-15 NOTE — ED Triage Notes (Signed)
Patient presents with complaints of scratchy throat, body aches, headaches, lower abdominal pain, and shortness of breath with exertion x 3 days. Pt reports using ibuprofen and otc cold and flu medication with minimal relief.

## 2020-01-16 LAB — NOVEL CORONAVIRUS, NAA: SARS-CoV-2, NAA: DETECTED — AB

## 2020-01-16 LAB — SARS-COV-2, NAA 2 DAY TAT

## 2020-01-24 ENCOUNTER — Ambulatory Visit: Payer: Federal, State, Local not specified - PPO | Admitting: Orthopaedic Surgery

## 2020-01-29 ENCOUNTER — Encounter: Payer: Self-pay | Admitting: Orthopaedic Surgery

## 2020-01-29 ENCOUNTER — Other Ambulatory Visit: Payer: Self-pay

## 2020-01-29 ENCOUNTER — Ambulatory Visit (INDEPENDENT_AMBULATORY_CARE_PROVIDER_SITE_OTHER): Payer: Federal, State, Local not specified - PPO | Admitting: Orthopaedic Surgery

## 2020-01-29 ENCOUNTER — Ambulatory Visit: Payer: Federal, State, Local not specified - PPO

## 2020-01-29 VITALS — BP 136/69 | HR 84 | Ht 69.0 in | Wt 255.0 lb

## 2020-01-29 DIAGNOSIS — M25561 Pain in right knee: Secondary | ICD-10-CM

## 2020-01-29 NOTE — Progress Notes (Signed)
Patient Meghan [name], female DOB:September 03, 1970, 50 y.o. BJS:[id number]  Chief Complaint  Patient presents with  . Knee Pain    Right/ popped while standing on CT table at work     HPI  Meghan [name] is a 50 y.o. 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  She heard a popping sound and had immediate pain more medially of the right knee.  She has had some swelling and the swelling has now decreased.  She has no redness.  She has had giving way that is not getting any better.  She has no other injury.  She did not report her injury at work.  She has tried Advil and Aleve.  She had ACL repair of the right knee in 2007.  Body mass index is 37.66 kg/m.  ROS  Review of Systems  HENT: Negative for congestion.   Respiratory: Negative for cough and shortness of breath.   Cardiovascular: Negative for chest pain and leg swelling.  Endocrine: Negative for cold intolerance.  Musculoskeletal: Positive for arthralgias, back pain and gait problem.  Allergic/Immunologic: Negative for environmental allergies.  Neurological: Negative for numbness.  All other systems reviewed and are negative.   All other systems reviewed and are negative.  The following is a summary of the past history medically, past history surgically, known current medicines, social history and family history.  This information is gathered electronically by the computer from prior information and documentation.  I review this each visit and have found including this information at this point in the chart is beneficial and informative.    Past Medical History:  Diagnosis Date  . Abdominal pain    RLQ  . Back pain   . Chest pain, unspecified   . Colitis    see psh for colonoscopy reports  . Constipation   . Dyspnea   . Endometrial polyp 11/09/2012  . Enlarged lymph node    right side - arm  . Family history of ovarian cancer    MGM  .  Generalized headaches   . GERD (gastroesophageal reflux disease)   . Hemorrhoids   . Hiatal hernia   . Hip pain   . IBS (irritable bowel syndrome)   . IBS (irritable bowel syndrome)   . Insomnia   . Irregular bleeding 01/01/2013  . Menorrhagia 10/17/2012  . Migraines   . Nausea   . Nephrolithiasis   . Obesity, unspecified   . Palpitations   . PVC (premature ventricular contraction)   . Vitamin D deficiency     Past Surgical History:  Procedure Laterality Date  . ABLATION     Uterine  . COLONOSCOPY  2005   Dr. Irving Shows. Colitis of cecum and ICV, bx nonspecific  . COLONOSCOPY N/A 02/10/2015   Minimal internal hemorrhoids, otherwise normal-appearing rectal mucosa. Normal-appearing colonic mucosa. Distal 10 cm of TI also appeared normal. Segmental biopsies taken benign.   . colonoscopy with ileoscopy  2008   Dr. Laural Golden. patch of coarse mucosa at TI, bx  neg.  Random colon bx showed minimal active focal colitis ?resolving infection. Felt not c/w IBD.  Marland Kitchen DILITATION & CURRETTAGE/HYSTROSCOPY WITH THERMACHOICE ABLATION N/A 12/01/2012   Procedure: DILATATION & CURETTAGE;REMOVAL OF ENDOMETRIAL POLYP; HYSTEROSCOPY WITH THERMACHOICE ENDOMETRIAL ABLATION;  Surgeon: Jonnie Kind, MD;  Location: [facility];  Service: Gynecology;  Laterality: N/A;  . ESOPHAGOGASTRODUODENOSCOPY  02/2008   Dr. Gala Romney- birnak appearing tubular esophagus.  small hiatal hernia, o/w normal stomach, D1, D2  . KIDNEY STONE SURGERY    . KNEE ARTHROSCOPY  2008   ACL repair  . LAPAROSCOPIC BILATERAL SALPINGECTOMY Bilateral 12/01/2012   Procedure: LAPAROSCOPIC BILATERAL SALPINGECTOMY ;  Surgeon: Jonnie Kind, MD;  Location: [facility];  Service: Gynecology;  Laterality: Bilateral;  . LESION REMOVAL Left 12/01/2012   Procedure: SKIN TAG REMOVAL (Left inner thigh) ;  Surgeon: Jonnie Kind, MD;  Location: [facility];  Service: Gynecology;  Laterality: Left;    Family History  Problem Relation Age of Onset  . Cancer Maternal  Grandmother        ovarian cancer  . Diabetes Maternal Grandfather   . Scleroderma Mother   . Sleep apnea Mother   . Obesity Mother   . Hypertension Father   . Cancer Father        liver  . Cirrhosis Father        non-alcoholic  . Obesity Father   . Stroke Paternal Grandmother   . Hypertension Paternal Grandmother   . Other Sister        Transverse myelitis  . Colon cancer Neg Hx   . Colon polyps Neg Hx     Social History Social History   Tobacco Use  . Smoking status: Never Smoker  . Smokeless tobacco: Never Used  Vaping Use  . Vaping Use: Never used  Substance Use Topics  . Alcohol use: Yes    Alcohol/week: 0.0 standard drinks    Comment: occ  . Drug use: No    Allergies  Allergen Reactions  . Zanaflex [Tizanidine Hcl] Hives  . Morphine Other (See Comments)    Neck pain  . Oxycodone-Acetaminophen Hives and Itching    Itching    Current Outpatient Medications  Medication Sig Dispense Refill  . buPROPion (WELLBUTRIN XL) 150 MG 24 hr tablet Take 150 mg by mouth daily. (Patient not taking: Reported on 01/29/2020)    . cyclobenzaprine (FLEXERIL) 10 MG tablet Take 1 tablet (10 mg total) by mouth at bedtime. One tablet every night at bedtime as needed for spasm. (Patient not taking: Reported on 01/29/2020) 30 tablet 0  . HYDROcodone-acetaminophen (NORCO/VICODIN) 5-325 MG tablet One tablet every four hours as needed for acute pain.  Limit of five days per [place]. (Patient not taking: Reported on 01/29/2020) 30 tablet 0  . liothyronine (CYTOMEL) 5 MCG tablet Take 5 mcg by mouth daily. (Patient not taking: Reported on 01/29/2020)    . pantoprazole (PROTONIX) 40 MG tablet Take 1 tablet (40 mg total) by mouth daily. 30 minutes before breakfast (Patient not taking: Reported on 01/29/2020) 30 tablet 3   No current facility-administered medications for this visit.     Physical Exam  Blood pressure 136/69, pulse 84, height 5\' 9"  (1.753 m), weight 255 lb (115.7  kg).  Constitutional: overall normal hygiene, normal nutrition, well developed, normal grooming, normal body habitus. Assistive device:none  Musculoskeletal: gait and station Limp right, muscle tone and strength are normal, no tremors or atrophy is present.  .  Neurological: coordination overall normal.  Deep tendon reflex/nerve stretch intact.  Sensation normal.  Cranial nerves II-XII intact.   Skin:   Normal overall no scars, lesions, ulcers or rashes. No psoriasis.  Psychiatric: Alert and oriented x 3.  Recent memory intact, remote memory unclear.  Normal mood and affect. Well groomed.  Good eye contact.  Cardiovascular: overall no swelling, no varicosities, no edema bilaterally, normal temperatures of the legs and  arms, no clubbing, cyanosis and good capillary refill.  Lymphatic: palpation is normal.  Right knee has effusion, crepitus and medial joint line pain. She has positive medial [place].  NV intact.  There is no distal edema.  All other systems reviewed and are negative   The patient has been educated about the nature of the problem(s) and counseled on treatment options.  The patient appeared to understand what I have discussed and is in agreement with it.  Encounter Diagnosis  Name Primary?  . Acute pain of right knee Yes   PROCEDURE NOTE:  The patient requests injections of the right knee , verbal consent was obtained.  The right knee was prepped appropriately after time out was performed.   Sterile technique was observed and injection of 1 cc of Depo-Medrol 40 mg with several cc's of plain xylocaine. Anesthesia was provided by ethyl chloride and a 20-gauge needle was used to inject the knee area. The injection was tolerated well.  A band aid dressing was applied.  The patient was advised to apply ice later today and tomorrow to the injection sight as needed.   PLAN Call if any problems.  Precautions discussed.  Continue current medications. Take the Aleve  regularly daily.  Return to clinic 2 weeks   Consider MRI if not improved.  Electronically Signed Sanjuana Kava, MD 1/11/202210:55 AM

## 2020-02-12 ENCOUNTER — Ambulatory Visit: Payer: Federal, State, Local not specified - PPO | Admitting: Orthopaedic Surgery

## 2020-02-27 ENCOUNTER — Ambulatory Visit: Payer: Federal, State, Local not specified - PPO | Admitting: Gastroenterology

## 2020-03-21 DIAGNOSIS — K921 Melena: Secondary | ICD-10-CM | POA: Diagnosis not present

## 2020-03-21 DIAGNOSIS — R779 Abnormality of plasma protein, unspecified: Secondary | ICD-10-CM | POA: Diagnosis not present

## 2020-03-22 LAB — CBC WITH DIFFERENTIAL/PLATELET
Absolute Monocytes: 456 cells/uL (ref 200–950)
Basophils Absolute: 32 cells/uL (ref 0–200)
Basophils Relative: 0.6 %
Eosinophils Absolute: 90 cells/uL (ref 15–500)
Eosinophils Relative: 1.7 %
HCT: 44.2 % (ref 35.0–45.0)
Hemoglobin: 14.4 g/dL (ref 11.7–15.5)
Lymphs Abs: 1701 cells/uL (ref 850–3900)
MCH: 28.1 pg (ref 27.0–33.0)
MCHC: 32.6 g/dL (ref 32.0–36.0)
MCV: 86.3 fL (ref 80.0–100.0)
MPV: 10.6 fL (ref 7.5–12.5)
Monocytes Relative: 8.6 %
Neutro Abs: 3021 cells/uL (ref 1500–7800)
Neutrophils Relative %: 57 %
Platelets: 256 10*3/uL (ref 140–400)
RBC: 5.12 10*6/uL — ABNORMAL HIGH (ref 3.80–5.10)
RDW: 12.9 % (ref 11.0–15.0)
Total Lymphocyte: 32.1 %
WBC: 5.3 10*3/uL (ref 3.8–10.8)

## 2020-03-22 LAB — HEPATIC FUNCTION PANEL
AG Ratio: 2.1 (calc) (ref 1.0–2.5)
ALT: 18 U/L (ref 6–29)
AST: 33 U/L (ref 10–35)
Albumin: 3.6 g/dL (ref 3.6–5.1)
Alkaline phosphatase (APISO): 41 U/L (ref 31–125)
Bilirubin, Direct: 0.1 mg/dL (ref 0.0–0.2)
Globulin: 1.7 g/dL (calc) — ABNORMAL LOW (ref 1.9–3.7)
Indirect Bilirubin: 0.3 mg/dL (calc) (ref 0.2–1.2)
Total Bilirubin: 0.4 mg/dL (ref 0.2–1.2)
Total Protein: 5.3 g/dL — ABNORMAL LOW (ref 6.1–8.1)

## 2020-03-22 LAB — IRON,TIBC AND FERRITIN PANEL
%SAT: 33 % (calc) (ref 16–45)
Ferritin: 30 ng/mL (ref 16–232)
Iron: 101 ug/dL (ref 40–190)
TIBC: 304 mcg/dL (calc) (ref 250–450)

## 2020-04-08 DIAGNOSIS — Z713 Dietary counseling and surveillance: Secondary | ICD-10-CM | POA: Diagnosis not present

## 2020-04-08 DIAGNOSIS — Z6841 Body Mass Index (BMI) 40.0 and over, adult: Secondary | ICD-10-CM | POA: Diagnosis not present

## 2020-04-16 DIAGNOSIS — L728 Other follicular cysts of the skin and subcutaneous tissue: Secondary | ICD-10-CM | POA: Diagnosis not present

## 2020-04-16 DIAGNOSIS — L570 Actinic keratosis: Secondary | ICD-10-CM | POA: Diagnosis not present

## 2020-05-09 DIAGNOSIS — Z6839 Body mass index (BMI) 39.0-39.9, adult: Secondary | ICD-10-CM | POA: Diagnosis not present

## 2020-06-03 ENCOUNTER — Other Ambulatory Visit: Payer: Self-pay

## 2020-06-03 ENCOUNTER — Ambulatory Visit: Payer: Federal, State, Local not specified - PPO | Admitting: Gastroenterology

## 2020-06-03 ENCOUNTER — Encounter: Payer: Self-pay | Admitting: Gastroenterology

## 2020-06-03 VITALS — BP 124/81 | HR 79 | Temp 97.5°F | Ht 69.0 in | Wt 269.4 lb

## 2020-06-03 DIAGNOSIS — R197 Diarrhea, unspecified: Secondary | ICD-10-CM | POA: Diagnosis not present

## 2020-06-03 DIAGNOSIS — R61 Generalized hyperhidrosis: Secondary | ICD-10-CM | POA: Diagnosis not present

## 2020-06-03 DIAGNOSIS — R1310 Dysphagia, unspecified: Secondary | ICD-10-CM

## 2020-06-03 DIAGNOSIS — M791 Myalgia, unspecified site: Secondary | ICD-10-CM

## 2020-06-03 DIAGNOSIS — R1030 Lower abdominal pain, unspecified: Secondary | ICD-10-CM | POA: Diagnosis not present

## 2020-06-03 DIAGNOSIS — R194 Change in bowel habit: Secondary | ICD-10-CM

## 2020-06-03 DIAGNOSIS — R5383 Other fatigue: Secondary | ICD-10-CM | POA: Diagnosis not present

## 2020-06-03 NOTE — Progress Notes (Signed)
Primary Care Physician: Celene Squibb, MD  Primary Gastroenterologist:  Garfield Cornea, MD   Chief Complaint  Patient presents with  . Abdominal Pain    Lower bilateral abd, sharp and diffuse since 11/2019  . loose stool  . Night Sweats    HPI: Meghan [name] is a 50 y.o. female here for follow up. Last seen in August.  History of chronic GERD, IBS-C.  Remote colonoscopy in 2005 by Dr. Tamala Julian with colitis of cecum and ICV but biopsy non-specific. Colonoscopy 2008 with patch of coarse mucosa at TI but biopsy negative, random colon biopsy showed minimal active focal colitis with question of resolving infection and not felt consistent with IBD. Most recent colonoscopy in 2017 by Dr. Gala Romney with minimal internal hemorrhoids, otherwise normal-appearing rectal mucosa. Normal-appearing colonic mucosa. Distal 10 cm of TI also appeared normal. Segmental biopsies taken benign.   Patient reports bilateral lower abdominal pain, wakes her up at night. Happens every day. Notices it more when she is still like in the car or bed. Keeps her up at night. Does not seem to be associated with BMs or related to meals.  Every joint in the body hurts. Feels like she has muscle weakness. Complains of fatigue. States she takes ibuprofen 600-800 mg at least once daily for knee pain. Sometimes takes naprosyn. The more she tries to exercise the more aches/pains she has. If she walks for exercise, then the next two days she can't move due to joint pain. She wakes up at nights with sweats. Sees her gyn in July. She thinks she is perimenopausal. Irregular and missed periods. She has some solid food dysphagia. No heartburn. BMs very loose, with mucous. Sometimes explosive diarrhea with certain foods. BM 2-3 per day. Often postprandial. Large quantity. Worse since 11/2019. No nocturnal diarrhea. No blood in the stool.   Started ozempic six weeks ago for weight loss. Causes some nausea. Weight is down 15 pounds.    H/O  positive ANA in 2015. States she saw rheumatology but nothing significant found.    Current Outpatient Medications  Medication Sig Dispense Refill  . Semaglutide (OZEMPIC, 0.25 OR 0.5 MG/DOSE, [place]) Inject into the skin. 0.5mg  weekly     No current facility-administered medications for this visit.    Allergies as of 06/03/2020 - Review Complete 06/03/2020  Allergen Reaction Noted  . Zanaflex [tizanidine hcl] Hives 01/07/2017  . Morphine Other (See Comments) 08/01/2013  . Oxycodone-acetaminophen Hives and Itching 12/01/2012    ROS:  General: Negative for anorexia, weight loss, fever, chills, +fatigue, +weakness. ENT: Negative for hoarseness, difficulty swallowing , nasal congestion. CV: Negative for chest pain, angina, palpitations, dyspnea on exertion, peripheral edema.  Respiratory: Negative for dyspnea at rest, dyspnea on exertion, cough, sputum, wheezing.  GI: See history of present illness. GU:  Negative for dysuria, hematuria, urinary incontinence, urinary frequency, nocturnal urination. See hpi  Endo: Negative for unusual weight change.    Physical Examination:   BP 124/81   Pulse 79   Temp (!) 97.5 F (36.4 C) (Temporal)   Ht 5\' 9"  (1.753 m)   Wt 269 lb 6.4 oz (122.2 kg)   BMI 39.78 kg/m   General: Well-nourished, well-developed in no acute distress.  Eyes: No icterus. Mouth: masked. Lungs: Clear to auscultation bilaterally.  Heart: Regular rate and rhythm, no murmurs rubs or gallops.  Abdomen: Bowel sounds are normal,   nondistended, no hepatosplenomegaly or masses, no abdominal bruits or hernia , no rebound or  guarding.  Mild lower abdominal tenderness, diffuse. Extremities: No lower extremity edema. No clubbing or deformities. Neuro: Alert and oriented x 4   Skin: Warm and dry, no jaundice.   Psych: Alert and cooperative, normal mood and affect.   Assessment: Pleasant 50 y/o female presenting for evaluation of bilateral lower abdominal pain, frequent loose  stool, night sweats. Also with concerns regarding fatigue, muscle weakness, joint pains, dysphagia.   Change in bowel habits, with increased frequency of stools mostly loose over past six months. History of IBS, historically more prone to constipation. Recent diarrhea more persistent. Ddx includes IBS, celiac disease, infectious etiology, IBD. Patient was not interested in pursuing antispasmotic for possible IBS prior to work up.   Lower abdominal pain unrelated to meals or bowel function. More noticeable if sitting or lying still. Doesn't notice as much when she is working and distracted. Complains of night sweats, irregular periods, possible perimenopausal. Has upcoming appt with gyn for follow up. She wonders if abdominal pain is gyn in nature. We discuss holding off on imaging until labs and stools complete, and she sees gyn. She may benefit from pelvic exam prior to considering CT imagine.   Prior history of low B12.   Plan: 1. Complete stool studies for Cdiff, GI profile, fecal calprotectin.  2. Complete labs for CRP, sed rate, CK/CK-MB, CBC, CMET, TTG IgA, IgA, ANA with reflex, B12.

## 2020-06-03 NOTE — Patient Instructions (Signed)
1. Complete stools and labs as soon as you can. I will touch base with you with results as available. 2. Try to move up your appointment with gyn.   At [facility] we value your feedback. You may receive a survey about your visit today. Please share your experience as we strive to create trusting relationships with our patients to provide genuine, compassionate, quality care.   We appreciate your understanding and patience as we review any laboratory studies, imaging, and other diagnostic tests that are ordered as we care for you. Our office policy is 5 business days for review of these results, and any emergent or urgent results are addressed in a timely manner for your best interest. If you do not hear from our office in 1 week, please contact us.    We also encourage the use of MyChart, which contains your medical information for your review as well. If you are not enrolled in this feature, an access code is on this after visit summary for your convenience. Thank you for allowing [place] to be involved in your care.

## 2020-06-09 ENCOUNTER — Other Ambulatory Visit: Payer: Self-pay

## 2020-06-09 ENCOUNTER — Other Ambulatory Visit (HOSPITAL_COMMUNITY)
Admission: RE | Admit: 2020-06-09 | Discharge: 2020-06-09 | Disposition: A | Discharge status: Home / Self Care | Payer: Federal, State, Local not specified - PPO | Source: Ambulatory Visit | Attending: Gastroenterology | Admitting: Gastroenterology

## 2020-06-09 DIAGNOSIS — R1030 Lower abdominal pain, unspecified: Secondary | ICD-10-CM | POA: Diagnosis not present

## 2020-06-09 DIAGNOSIS — R197 Diarrhea, unspecified: Secondary | ICD-10-CM | POA: Insufficient documentation

## 2020-06-09 DIAGNOSIS — R1310 Dysphagia, unspecified: Secondary | ICD-10-CM | POA: Diagnosis not present

## 2020-06-09 DIAGNOSIS — R5383 Other fatigue: Secondary | ICD-10-CM | POA: Diagnosis not present

## 2020-06-09 DIAGNOSIS — R194 Change in bowel habit: Secondary | ICD-10-CM | POA: Diagnosis not present

## 2020-06-09 DIAGNOSIS — M791 Myalgia, unspecified site: Secondary | ICD-10-CM | POA: Insufficient documentation

## 2020-06-09 DIAGNOSIS — R61 Generalized hyperhidrosis: Secondary | ICD-10-CM | POA: Diagnosis not present

## 2020-06-09 LAB — COMPREHENSIVE METABOLIC PANEL
ALT: 18 U/L (ref 0–44)
AST: 32 U/L (ref 15–41)
Albumin: 3.9 g/dL (ref 3.5–5.0)
Alkaline Phosphatase: 56 U/L (ref 38–126)
Anion gap: 5 (ref 5–15)
BUN: 19 mg/dL (ref 6–20)
CO2: 27 mmol/L (ref 22–32)
Calcium: 9.2 mg/dL (ref 8.9–10.3)
Chloride: 104 mmol/L (ref 98–111)
Creatinine, Ser: 0.63 mg/dL (ref 0.44–1.00)
GFR, Estimated: >60 mL/min (ref >60)
Glucose, Bld: 96 mg/dL (ref 70–99)
Potassium: 4.4 mmol/L (ref 3.5–5.1)
Sodium: 136 mmol/L (ref 135–145)
Total Bilirubin: 0.4 mg/dL (ref 0.3–1.2)
Total Protein: 6.7 g/dL (ref 6.5–8.1)

## 2020-06-09 LAB — CBC WITH DIFFERENTIAL/PLATELET
Abs Immature Granulocytes: 0.02 10*3/uL (ref 0.00–0.07)
Basophils Absolute: 0 10*3/uL (ref 0.0–0.1)
Basophils Relative: 0 %
Eosinophils Absolute: 0.1 10*3/uL (ref 0.0–0.5)
Eosinophils Relative: 2 %
HCT: 44.6 % (ref 36.0–46.0)
Hemoglobin: 14.4 g/dL (ref 12.0–15.0)
Immature Granulocytes: 0 %
Lymphocytes Relative: 27 %
Lymphs Abs: 1.8 10*3/uL (ref 0.7–4.0)
MCH: 28.5 pg (ref 26.0–34.0)
MCHC: 32.3 g/dL (ref 30.0–36.0)
MCV: 88.3 fL (ref 80.0–100.0)
Monocytes Absolute: 0.5 10*3/uL (ref 0.1–1.0)
Monocytes Relative: 8 %
Neutro Abs: 4.3 10*3/uL (ref 1.7–7.7)
Neutrophils Relative %: 63 %
Platelets: 212 10*3/uL (ref 150–400)
RBC: 5.05 MIL/uL (ref 3.87–5.11)
RDW: 12.2 % (ref 11.5–15.5)
WBC: 6.8 10*3/uL (ref 4.0–10.5)
nRBC: 0 % (ref 0.0–0.2)

## 2020-06-09 LAB — VITAMIN B12: Vitamin B-12: 200 pg/mL (ref 180–914)

## 2020-06-09 LAB — SEDIMENTATION RATE: Sed Rate: 2 mm/hr (ref 0–22)

## 2020-06-09 LAB — C-REACTIVE PROTEIN: CRP: 0.6 mg/dL (ref <1.0)

## 2020-06-09 LAB — CK TOTAL AND CKMB (NOT AT ARMC)
CK, MB: 1.5 ng/mL (ref 0.5–5.0)
Relative Index: INVALID (ref 0.0–2.5)
Total CK: 63 U/L (ref 38–234)

## 2020-06-10 LAB — TISSUE TRANSGLUTAMINASE, IGA: Tissue Transglutaminase Ab, IgA: <2 U/mL (ref 0–3)

## 2020-06-10 LAB — ANA W/REFLEX IF POSITIVE: Anti Nuclear Antibody (ANA): POSITIVE — AB

## 2020-06-10 LAB — ENA+DNA/DS+ANTICH+CENTRO+JO...
Anti JO-1: <0.2 AI (ref 0.0–0.9)
Centromere Ab Screen: 1.4 AI — ABNORMAL HIGH (ref 0.0–0.9)
Chromatin Ab SerPl-aCnc: <0.2 AI (ref 0.0–0.9)
ENA SM Ab Ser-aCnc: <0.2 AI (ref 0.0–0.9)
Ribonucleic Protein: <0.2 AI (ref 0.0–0.9)
SSA (Ro) (ENA) Antibody, IgG: <0.2 AI (ref 0.0–0.9)
SSB (La) (ENA) Antibody, IgG: <0.2 AI (ref 0.0–0.9)
Scleroderma (Scl-70) (ENA) Antibody, IgG: <0.2 AI (ref 0.0–0.9)
ds DNA Ab: 1 IU/mL (ref 0–9)

## 2020-06-10 LAB — IGA: IgA: 193 mg/dL (ref 87–352)

## 2020-06-12 DIAGNOSIS — E669 Obesity, unspecified: Secondary | ICD-10-CM | POA: Diagnosis not present

## 2020-06-13 ENCOUNTER — Other Ambulatory Visit: Payer: Self-pay

## 2020-06-13 ENCOUNTER — Encounter: Payer: Self-pay | Admitting: Adult Health

## 2020-06-13 ENCOUNTER — Other Ambulatory Visit (HOSPITAL_COMMUNITY)
Admission: RE | Admit: 2020-06-13 | Discharge: 2020-06-13 | Disposition: A | Discharge status: Home / Self Care | Payer: Federal, State, Local not specified - PPO | Source: Ambulatory Visit | Attending: Gastroenterology | Admitting: Gastroenterology

## 2020-06-13 ENCOUNTER — Ambulatory Visit: Payer: Federal, State, Local not specified - PPO | Admitting: Adult Health

## 2020-06-13 VITALS — BP 113/75 | HR 81 | Ht 69.0 in | Wt 269.0 lb

## 2020-06-13 DIAGNOSIS — M791 Myalgia, unspecified site: Secondary | ICD-10-CM

## 2020-06-13 DIAGNOSIS — R768 Other specified abnormal immunological findings in serum: Secondary | ICD-10-CM

## 2020-06-13 DIAGNOSIS — R61 Generalized hyperhidrosis: Secondary | ICD-10-CM | POA: Insufficient documentation

## 2020-06-13 DIAGNOSIS — R102 Pelvic and perineal pain: Secondary | ICD-10-CM

## 2020-06-13 DIAGNOSIS — R1030 Lower abdominal pain, unspecified: Secondary | ICD-10-CM | POA: Diagnosis not present

## 2020-06-13 DIAGNOSIS — R1310 Dysphagia, unspecified: Secondary | ICD-10-CM | POA: Insufficient documentation

## 2020-06-13 DIAGNOSIS — R194 Change in bowel habit: Secondary | ICD-10-CM | POA: Diagnosis not present

## 2020-06-13 DIAGNOSIS — R10816 Epigastric abdominal tenderness: Secondary | ICD-10-CM | POA: Diagnosis not present

## 2020-06-13 DIAGNOSIS — R5383 Other fatigue: Secondary | ICD-10-CM

## 2020-06-13 DIAGNOSIS — R197 Diarrhea, unspecified: Secondary | ICD-10-CM | POA: Insufficient documentation

## 2020-06-13 NOTE — Progress Notes (Signed)
  Subjective:     Patient ID: Sammuel Bailiff, female   DOB: 04-19-1970, 50 y.o.   MRN: [id number]  HPI Jode is a 50 year old white female,married, G2P2 in complaining of pelvic pain on and off since November. Just saw Neil Crouch PA and had labs, ANA is +, centromere Ab screen 1.4 PCP is Dr [place] Crane.   Review of Systems +pelvic pain on and off since November Reviewed past medical,surgical, social and family history. Reviewed medications and allergies.      Objective:   Physical Exam BP 113/75 (BP Location: Left Arm, Patient Position: Sitting, Cuff Size: Large)   Pulse 81   Ht 5\' 9"  (1.753 m)   Wt 269 lb (122 kg)   LMP 05/16/2020   BMI 39.72 kg/m    Skin warm and dry.  Lungs: clear to ausculation bilaterally. Cardiovascular: regular rate and rhythm.Abdomen is soft, +tenderness in epigastric area and low pelvic area. Pelvic deferred today, will get [place].  Fall risk is low  [facility] - 06/13/20 0911      Pregnancy Intention Screening   Does the patient want to become pregnant in the next year? No    Does the patient's partner want to become pregnant in the next year? No    Would the patient like to discuss contraceptive options today? No      Contraception Wrap Up   Current Method Vasectomy    End Method Vasectomy    Contraception Counseling Provided No          Assessment:     1. Pelvic pain Pelvic US at [facility] 06/23/20 at 2:30 pm  2. Epigastric abdominal tenderness without rebound tenderness Abdomen US 06/20/20 at 7:30 am at [place]:     Has pap and physical appt 07/24/20 with me

## 2020-06-14 LAB — GASTROINTESTINAL PANEL BY PCR, STOOL (REPLACES STOOL CULTURE)

## 2020-06-17 LAB — CALPROTECTIN, FECAL: Calprotectin, Fecal: 23 ug/g (ref 0–120)

## 2020-06-20 ENCOUNTER — Ambulatory Visit (HOSPITAL_COMMUNITY): Payer: Federal, State, Local not specified - PPO

## 2020-06-23 ENCOUNTER — Ambulatory Visit (HOSPITAL_COMMUNITY): Payer: Federal, State, Local not specified - PPO

## 2020-06-24 ENCOUNTER — Encounter: Payer: Self-pay | Admitting: Orthopaedic Surgery

## 2020-06-24 ENCOUNTER — Other Ambulatory Visit: Payer: Self-pay

## 2020-06-24 ENCOUNTER — Ambulatory Visit: Payer: Federal, State, Local not specified - PPO | Admitting: Orthopaedic Surgery

## 2020-06-24 VITALS — BP 133/93 | HR 77 | Ht 69.0 in | Wt 268.4 lb

## 2020-06-24 DIAGNOSIS — M7062 Trochanteric bursitis, left hip: Secondary | ICD-10-CM

## 2020-06-24 MED ORDER — PREDNISONE 5 MG (21) PO TBPK: ORAL_TABLET | ORAL | 0 refills | Status: DC

## 2020-06-24 NOTE — Progress Notes (Signed)
My left hip hurts but only at night and laying on it.  She has left hip lateral pain for the last several months.  It does not hurt during the day but more at night when she lays on it.  She has tried Naprosyn, pain medicine and heat but it still hurts.  She denies any trauma.  She has no problems walking. She has no redness.  She has no numbness.    ROM of the left hip is full.  NV intact .She is tender over the lateral hip but has no swelling.  Gait is good.  Encounter Diagnosis  Name Primary?  . Trochanteric bursitis, left hip Yes   I will give prednisone dose pack.  Use ice.  Return in two weeks.  If not improved, consider X-rays and injection to the area.  Call if any problem.  Precautions discussed.   Electronically Signed Sanjuana Kava, MD 6/7/202210:13 AM

## 2020-06-24 NOTE — Patient Instructions (Addendum)
Take prednisone taper for the inflammation Dr Luna Glasgow will send to pharmacy today if not helpful he can inject for you when you come back. If it gets worse please call and we can get you in sooner for injection. Try to find out what is flaring your hip/ you may be hitting it on something which is irritating it.   Hip Bursitis  Hip bursitis is swelling of one or more fluid-filled sacs (bursae) in your hip joint. This condition can cause pain, and your symptoms may come and go over time. What are the causes?  Repeated use of your hip muscles.  Injury to the hip.  Weak butt muscles.  Bone spurs.  Infection. In some cases, the cause may not be known. What increases the risk? You are more likely to develop this condition if:  You had a past hip injury or hip surgery.  You have a condition, such as arthritis, gout, diabetes, or thyroid disease.  You have spine problems.  You have one leg that is shorter than the other.  You run a lot or do long-distance running.  You play sports where there is a risk of injury or falling, such as football, martial arts, or skiing. What are the signs or symptoms? Symptoms may come and go, and they often include:  Pain in the hip or groin area. Pain may get worse when you move your hip.  Tenderness and swelling of the hip. In rare cases, the bursa may become infected. If this happens, you may get a fever, as well as have warmth and redness in the hip area. How is this treated? This condition is treated by:  Resting your hip.  Icing your hip.  Wrapping the hip area with an elastic bandage (compression wrap).  Keeping the hip raised. Other treatments may include medicine, draining fluid out of the bursa, or using crutches, a cane, or a walker. Surgery may be needed, but this is rare. Long-term treatment may include doing exercises to help your strength and flexibility. It may also include lifestyle changes like losing weight to lessen the  strain on your hip. Follow these instructions at home: Managing pain, stiffness, and swelling  If told, put ice on the painful area. ? Put ice in a plastic bag. ? Place a towel between your skin and the bag. ? Leave the ice on for 20 minutes, 2-3 times a day.  Raise your hip by putting a pillow under your hips while you lie down. Stop if you feel pain.  If told, put heat on the affected area. Do this as often as told by your doctor. Use a moist heat pack or a heating pad as told by your doctor. ? Place a towel between your skin and the heat source. ? Leave the heat on for 20-30 minutes. ? Take off the heat if your skin turns bright red. This is very important if you are unable to feel pain, heat, or cold. You may have a greater risk of getting burned.      Activity  Do not use your hip to support your body weight until your doctor says that you can.  Use crutches, a cane, or a walker as told by your doctor.  If the affected leg is one that you use to drive, ask your doctor if it is safe to drive.  Rest and protect your hip as much as you can until you feel better.  Return to your normal activities as told by your  doctor. Ask your doctor what activities are safe for you.  Do exercises as told by your doctor. General instructions  Take over-the-counter and prescription medicines only as told by your doctor.  Gently rub and stretch your injured area as often as is comfortable.  Wear elastic bandages only as told by your doctor.  If one of your legs is shorter than the other, get fitted for a shoe insert or orthotic.  Keep a healthy weight. Follow instructions from your doctor.  Keep all follow-up visits as told by your doctor. This is important. How is this prevented?  Exercise regularly, as told by your doctor.  Wear the right shoes for the sport you play.  Warm up and stretch before being active. Cool down and stretch after being active.  Take breaks often from  repeated activity.  Avoid activities that bother your hip or cause pain.  Avoid sitting down for a long time. Where to find more information  American Academy of Orthopaedic Surgeons: orthoinfo.aaos.org Contact a doctor if:  You have a fever.  You have new symptoms.  You have trouble walking or doing everyday activities.  You have pain that gets worse or does not get better with medicine.  Your skin around your hip is red.  You get a feeling of warmth in your hip area. Get help right away if:  You cannot move your hip.  You have very bad pain.  You cannot control the muscles in your feet. Summary  Hip bursitis is swelling of one or more fluid-filled sacs (bursae) in your hip joint.  Symptoms often come and go over time.  This condition is often treated by resting and icing the hip. It also may help to keep the area raised and wrapped in an elastic bandage. Other treatments may be needed. This information is not intended to replace advice given to you by your health care provider. Make sure you discuss any questions you have with your health care provider. Document Revised: 11/06/2018 Document Reviewed: 09/12/2017 Elsevier Patient Education  2021 [employer].

## 2020-06-25 ENCOUNTER — Ambulatory Visit (HOSPITAL_COMMUNITY)
Admission: RE | Admit: 2020-06-25 | Discharge: 2020-06-25 | Disposition: A | Discharge status: Home / Self Care | Payer: Federal, State, Local not specified - PPO | Source: Ambulatory Visit | Attending: Adult Health | Admitting: Adult Health

## 2020-06-25 ENCOUNTER — Telehealth: Payer: Self-pay

## 2020-06-25 ENCOUNTER — Other Ambulatory Visit: Payer: Self-pay

## 2020-06-25 DIAGNOSIS — G8929 Other chronic pain: Secondary | ICD-10-CM | POA: Diagnosis not present

## 2020-06-25 DIAGNOSIS — R1031 Right lower quadrant pain: Secondary | ICD-10-CM | POA: Diagnosis not present

## 2020-06-25 DIAGNOSIS — D252 Subserosal leiomyoma of uterus: Secondary | ICD-10-CM | POA: Diagnosis not present

## 2020-06-25 DIAGNOSIS — R10816 Epigastric abdominal tenderness: Secondary | ICD-10-CM | POA: Diagnosis not present

## 2020-06-25 DIAGNOSIS — R102 Pelvic and perineal pain: Secondary | ICD-10-CM

## 2020-06-25 DIAGNOSIS — R5383 Other fatigue: Secondary | ICD-10-CM

## 2020-06-25 DIAGNOSIS — R1032 Left lower quadrant pain: Secondary | ICD-10-CM | POA: Diagnosis not present

## 2020-06-25 DIAGNOSIS — R768 Other specified abnormal immunological findings in serum: Secondary | ICD-10-CM

## 2020-06-25 DIAGNOSIS — M791 Myalgia, unspecified site: Secondary | ICD-10-CM

## 2020-06-25 NOTE — Telephone Encounter (Signed)
Received message from pt, [facility] can't see her until February 2023. She contacted [facility] and they require referral. Referral sent to [facility] via [place] per pt request. FYI to Neil Crouch PA. Referral to [facility] cancelled.

## 2020-06-25 NOTE — Progress Notes (Unsigned)
amb re 

## 2020-07-01 DIAGNOSIS — L570 Actinic keratosis: Secondary | ICD-10-CM | POA: Diagnosis not present

## 2020-07-01 DIAGNOSIS — L814 Other melanin hyperpigmentation: Secondary | ICD-10-CM | POA: Diagnosis not present

## 2020-07-02 ENCOUNTER — Telehealth: Payer: Self-pay | Admitting: Gastroenterology

## 2020-07-02 DIAGNOSIS — R1030 Lower abdominal pain, unspecified: Secondary | ICD-10-CM

## 2020-07-02 DIAGNOSIS — R194 Change in bowel habit: Secondary | ICD-10-CM

## 2020-07-02 DIAGNOSIS — R61 Generalized hyperhidrosis: Secondary | ICD-10-CM

## 2020-07-02 NOTE — Telephone Encounter (Signed)
Please schedule CT A/P with contrast for Dx: lower abdominal pain, change in bowels, night sweats. Patient aware of plan, see mychart message.

## 2020-07-03 NOTE — Addendum Note (Signed)
Addended by: Cheron Every on: 07/03/2020 08:00 AM   Modules accepted: Orders

## 2020-07-03 NOTE — Telephone Encounter (Signed)
Patient aware order placed and she is going to call and get her appt scheduled.

## 2020-07-08 ENCOUNTER — Ambulatory Visit (HOSPITAL_COMMUNITY): Admission: RE | Admit: 2020-07-08 | Payer: Federal, State, Local not specified - PPO | Source: Ambulatory Visit

## 2020-07-09 ENCOUNTER — Ambulatory Visit (HOSPITAL_COMMUNITY)
Admission: RE | Admit: 2020-07-09 | Discharge: 2020-07-09 | Disposition: A | Discharge status: Home / Self Care | Payer: Federal, State, Local not specified - PPO | Source: Ambulatory Visit | Attending: Gastroenterology | Admitting: Gastroenterology

## 2020-07-09 DIAGNOSIS — R61 Generalized hyperhidrosis: Secondary | ICD-10-CM

## 2020-07-09 DIAGNOSIS — R194 Change in bowel habit: Secondary | ICD-10-CM | POA: Insufficient documentation

## 2020-07-09 DIAGNOSIS — R1030 Lower abdominal pain, unspecified: Secondary | ICD-10-CM | POA: Insufficient documentation

## 2020-07-09 DIAGNOSIS — R109 Unspecified abdominal pain: Secondary | ICD-10-CM | POA: Diagnosis not present

## 2020-07-09 MED ORDER — IOHEXOL 300 MG/ML  SOLN
100.0000 mL | Freq: Once | INTRAMUSCULAR | Status: AC | PRN
  Administered 2020-07-09: 100 mL via INTRAVENOUS

## 2020-07-24 ENCOUNTER — Other Ambulatory Visit: Payer: Federal, State, Local not specified - PPO | Admitting: Adult Health

## 2020-07-30 ENCOUNTER — Encounter: Payer: Self-pay | Admitting: Orthopaedic Surgery

## 2020-07-31 MED ORDER — HYDROCODONE-ACETAMINOPHEN 5-325 MG PO TABS: 1.0000 | ORAL_TABLET | Freq: Four times a day (QID) | ORAL | 0 refills | Status: DC | PRN

## 2020-07-31 MED ORDER — PREDNISONE 10 MG (21) PO TBPK: ORAL_TABLET | ORAL | 1 refills | Status: DC

## 2020-07-31 NOTE — Telephone Encounter (Signed)
Spoke with patient; aware Inez Catalina is now out of clinic for the day; states to thank her and thank Dr Luna Glasgow.

## 2020-08-08 ENCOUNTER — Ambulatory Visit: Payer: Federal, State, Local not specified - PPO | Admitting: Internal Medicine

## 2020-08-12 ENCOUNTER — Other Ambulatory Visit: Payer: Self-pay

## 2020-08-12 ENCOUNTER — Encounter: Payer: Self-pay | Admitting: Orthopaedic Surgery

## 2020-08-12 ENCOUNTER — Ambulatory Visit: Payer: Federal, State, Local not specified - PPO | Admitting: Orthopaedic Surgery

## 2020-08-12 DIAGNOSIS — M7062 Trochanteric bursitis, left hip: Secondary | ICD-10-CM | POA: Diagnosis not present

## 2020-08-12 NOTE — Progress Notes (Signed)
My hip is a little better but it is still having some pain.  Her left hip trochanteric area is improved after the prednisone dose pack.  She has some tenderness still.  She has no trauma.  ROM of the left hip is full. She has only slight tenderness of the bursa laterally.  NV intact.  Encounter Diagnosis  Name Primary?   Trochanteric bursitis, left hip Yes   She needs to take the ibuprofen regularly three times a day.  I think this will help.  Be on it at least for two weeks continuously.  I will see as needed.  Call if any problem.  Precautions discussed.  Electronically Signed Sanjuana Kava, MD 7/26/20229:37 AM

## 2020-08-15 ENCOUNTER — Ambulatory Visit: Payer: Federal, State, Local not specified - PPO | Admitting: Internal Medicine

## 2020-08-15 ENCOUNTER — Encounter: Payer: Self-pay | Admitting: Internal Medicine

## 2020-08-15 ENCOUNTER — Other Ambulatory Visit: Payer: Self-pay

## 2020-08-15 VITALS — BP 117/75 | HR 83 | Resp 15 | Ht 68.5 in | Wt 266.0 lb

## 2020-08-15 DIAGNOSIS — R809 Proteinuria, unspecified: Secondary | ICD-10-CM | POA: Diagnosis not present

## 2020-08-15 DIAGNOSIS — R0602 Shortness of breath: Secondary | ICD-10-CM

## 2020-08-15 DIAGNOSIS — R7689 Other specified abnormal immunological findings in serum: Secondary | ICD-10-CM

## 2020-08-15 DIAGNOSIS — R202 Paresthesia of skin: Secondary | ICD-10-CM

## 2020-08-15 DIAGNOSIS — R319 Hematuria, unspecified: Secondary | ICD-10-CM | POA: Diagnosis not present

## 2020-08-15 DIAGNOSIS — R5383 Other fatigue: Secondary | ICD-10-CM

## 2020-08-15 DIAGNOSIS — R200 Anesthesia of skin: Secondary | ICD-10-CM

## 2020-08-15 DIAGNOSIS — R768 Other specified abnormal immunological findings in serum: Secondary | ICD-10-CM | POA: Insufficient documentation

## 2020-08-15 DIAGNOSIS — I493 Ventricular premature depolarization: Secondary | ICD-10-CM

## 2020-08-15 NOTE — Progress Notes (Signed)
Office Visit Note  Patient: Meghan [name]             Date of Birth: 02-May-1970           MRN: [id number]             PCP: Celene Squibb, MD Referring: Westly Pam Visit Date: 08/15/2020 Occupation: Radiology technician  Subjective:  New Patient (Initial Visit) (Abnormal labs,fatigue)   History of Present Illness: Meghan [name] is a 50 y.o. female here for positive ANA with fatigue and joint pains.  She has a variety of chronic symptoms but mostly reports persistent fatigue starting in November 2021.  She does not recall any associated medical events changes in treatments or other symptoms that changed at the same time.  She denies associated sleep changes or excessive daytime somnolence but feels completely without energy or motivation by the end of a workday.  She has felt some fairly diffuse muscle and joint aches but denies constant pain or generalized sensitivity to touch.  She has some lateral hip pain worst at night is being seen for trochanteric bursitis for this recent steroid treatment improved symptoms.  She feels her mood has been normal with no depression and no large change in stress.  She has started experiencing increased interval and irregularity of menstrual periods and some hot flashes.  Recent work-up in primary care clinic and OB/GYN including abdominal CT was mostly unremarkable demonstrating uterine fibroid degenerative lumbar spine disease and nonobstructing nephrolithiasis. She is also noticed decreased exertional capacity becoming fatigued after climbing 1 flight of stairs or less ability to lift heavy objects or push loaded stretchers compared to her usual.  She notices pedal edema when feet are in a dependent position for a prolonged time such as driving 2 hours but not routinely during the day.  She denies any shortness of breath at rest or any respiratory symptoms.  She states she was seen by cardiologist years ago for persistent chest pain and  palpitations with apparently no particular concerns found at the time. She has chronic irritable bowels with constipation predominant most recent colonoscopy in 2017 unremarkable except for hemorrhoids.  She describes trouble swallowing intermittently and has had at least 1 endoscopy with dilation she does not recall the exact diagnosis for this. She reports noticing Raynaud's with pallor of 1 fingers during cold exposure.  Her mother carries a diagnosis of scleroderma apparently with known symptoms of Raynaud's and sclerodactyly she is not sure whether there were other features.  Labs reviewed 05/2020 Fecal calprotectin wnl ANA pos Centromere 1.4 dsDNA, RNP, Smith, Scl-70, SSA, SSB, Jo-1 neg IgA wnl Ttg wnl CK wnl ESR wnl CRP wnl CBC wnl CMP wnl  Imaging reviewed 07/09/20 CT abdomen/pelvis IMPRESSION: 1. No acute findings in the abdomen or pelvis. 2. Nonobstructive right nephrolithiasis. 3. Leiomyomatous uterus.  01/2015 Colonoscopy Hemorrhoids else wnl  Activities of Daily Living:  Patient reports morning stiffness for 30 minutes.   Patient Denies nocturnal pain.  Difficulty dressing/grooming: Denies Difficulty climbing stairs: Denies Difficulty getting out of chair: Reports Difficulty using hands for taps, buttons, cutlery, and/or writing: Denies  Review of Systems  Constitutional:  Positive for fatigue.  HENT:  Positive for mouth dryness.   Eyes:  Positive for dryness.  Respiratory:  Positive for shortness of breath.   Cardiovascular:  Positive for swelling in legs/feet.  Gastrointestinal:  Positive for constipation.  Endocrine: Positive for cold intolerance, heat intolerance and excessive thirst.  Genitourinary:  Negative  for difficulty urinating.  Musculoskeletal:  Positive for joint pain, joint pain, joint swelling, muscle weakness, morning stiffness and muscle tenderness.  Skin:  Negative for rash.  Allergic/Immunologic: Negative for susceptible to infections.   Neurological:  Positive for numbness and weakness.  Hematological:  Negative for bruising/bleeding tendency.  Psychiatric/Behavioral:  Positive for sleep disturbance.    PMFS History:  Patient Active Problem List   Diagnosis Date Noted   Positive ANA (antinuclear antibody) 08/15/2020   Epigastric abdominal tenderness without rebound tenderness 06/13/2020   Lower abdominal pain 06/03/2020   Night sweats 06/03/2020   Dysphagia 06/03/2020   Muscle ache 06/03/2020   Blood in stool 09/06/2019   Other fatigue 09/06/2019   Irregular menstrual cycle 03/09/2019   Pelvic pain 03/09/2019   Proteinuria 03/09/2019   Hematuria 03/09/2019   Changing skin lesion 02/10/2018   Seborrheic keratoses 02/10/2018   Encounter for counseling 01/07/2017   Irregular heart beat 01/07/2017   Reactive depression 01/07/2017   Stress headaches 06/23/2015   Excessive daytime sleepiness 06/23/2015   Morbid obesity due to excess calories ([place]) 06/23/2015   Change in bowel habits    Heme + stool    Heme positive stool 12/20/2014   Bowel habit changes 12/20/2014   Dysesthesia of multiple sites 08/29/2013   Migraine without aura and without status migrainosus, not intractable 08/01/2013   Irregular bleeding 01/01/2013   Endometrial polyp 11/09/2012   Menorrhagia 10/17/2012   Upper abdominal pain 10/06/2011   GERD (gastroesophageal reflux disease) 08/21/2010   Abdominal pain 08/21/2010   Cervical lymphadenopathy 08/21/2010   Localized swelling, mass, or lump of upper extremity 08/21/2010   Low back pain radiating to both legs 08/19/2010   Neck pain, chronic 08/19/2010   Numbness and tingling in both hands 08/19/2010   Premature ventricular contractions 07/17/2008   PALPITATIONS 07/17/2008   DYSPNEA 07/17/2008   CHEST PAIN-UNSPECIFIED 07/17/2008   OBESITY 09/21/2007   COLITIS 09/21/2007   CONSTIPATION 09/21/2007   IBS 09/21/2007   NAUSEA 09/21/2007   Diarrhea 09/21/2007   Abdominal pain 09/21/2007     Past Medical History:  Diagnosis Date   Abdominal pain    RLQ   Back pain    Chest pain, unspecified    Colitis    see psh for colonoscopy reports   Constipation    Dyspnea    Endometrial polyp 11/09/2012   Enlarged lymph node    right side - arm   Family history of ovarian cancer    MGM   Generalized headaches    GERD (gastroesophageal reflux disease)    Hemorrhoids    Hiatal hernia    Hip pain    IBS (irritable bowel syndrome)    IBS (irritable bowel syndrome)    Insomnia    Irregular bleeding 01/01/2013   Menorrhagia 10/17/2012   Migraines    Nausea    Nephrolithiasis    Obesity, unspecified    Palpitations    PVC (premature ventricular contraction)    Vitamin D deficiency     Family History  Problem Relation Age of Onset   Scleroderma Mother    Sleep apnea Mother    Obesity Mother    Hypertension Father    Cancer Father        liver   Cirrhosis Father        non-alcoholic   Obesity Father    Other Sister        Transverse myelitis   Cancer Maternal Grandmother  ovarian cancer   Diabetes Maternal Grandfather    Stroke Paternal Grandmother    Hypertension Paternal Grandmother    Colon cancer Neg Hx    Colon polyps Neg Hx    Past Surgical History:  Procedure Laterality Date   ABLATION     Uterine   COLONOSCOPY  2005   Dr. Irving Shows. Colitis of cecum and ICV, bx nonspecific   COLONOSCOPY N/A 02/10/2015   Minimal internal hemorrhoids, otherwise normal-appearing rectal mucosa. Normal-appearing colonic mucosa. Distal 10 cm of TI also appeared normal. Segmental biopsies taken benign.    colonoscopy with ileoscopy  2008   Dr. Laural Golden. patch of coarse mucosa at TI, bx  neg.  Random colon bx showed minimal active focal colitis ?resolving infection. Felt not c/w IBD.   DILITATION & CURRETTAGE/HYSTROSCOPY WITH THERMACHOICE ABLATION N/A 12/01/2012   Procedure: DILATATION & CURETTAGE;REMOVAL OF ENDOMETRIAL POLYP; HYSTEROSCOPY WITH THERMACHOICE ENDOMETRIAL  ABLATION;  Surgeon: Jonnie Kind, MD;  Location: [facility];  Service: Gynecology;  Laterality: N/A;   ESOPHAGOGASTRODUODENOSCOPY  02/2008   Dr. Gala Romney- birnak appearing tubular esophagus. small hiatal hernia, o/w normal stomach, D1, D2   KIDNEY STONE SURGERY     KNEE ARTHROSCOPY  2008   ACL repair   LAPAROSCOPIC BILATERAL SALPINGECTOMY Bilateral 12/01/2012   Procedure: LAPAROSCOPIC BILATERAL SALPINGECTOMY ;  Surgeon: Jonnie Kind, MD;  Location: [facility];  Service: Gynecology;  Laterality: Bilateral;   LESION REMOVAL Left 12/01/2012   Procedure: SKIN TAG REMOVAL (Left inner thigh) ;  Surgeon: Jonnie Kind, MD;  Location: [facility];  Service: Gynecology;  Laterality: Left;   Social History   Social History Narrative   Patient is married Brewing technologist) and lives at home with her husband and two children.   Patient works full-time at [employer].   Patient has a Associates degree.   Patient is right-handed.   Patient drinks tea occasionally.      Immunization History  Administered Date(s) Administered   [employer] Vaccination 02/19/2019, 08/19/2019     Objective: Vital Signs: BP 117/75 (BP Location: Right Arm, Patient Position: Sitting, Cuff Size: Normal)   Pulse 83   Resp 15   Ht 5' 8.5" (1.74 m)   Wt 266 lb (120.7 kg)   BMI 39.86 kg/m    Physical Exam Constitutional:      Appearance: She is obese.  HENT:     Mouth/Throat:     Mouth: Mucous membranes are moist.     Pharynx: Oropharynx is clear.  Eyes:     Conjunctiva/sclera: Conjunctivae normal.  Cardiovascular:     Rate and Rhythm: Normal rate and regular rhythm.  Pulmonary:     Effort: Pulmonary effort is normal.     Breath sounds: Normal breath sounds.  Skin:    General: Skin is warm and dry.     Findings: No rash.     Comments: Normal nailfold capillaroscopy no digital or nail pitting No sclerodactyly No obvious telangiectasias or calcifications  Neurological:     General: No focal deficit present.      Mental Status: She is alert.     Deep Tendon Reflexes: Reflexes normal.  Psychiatric:        Mood and Affect: Mood normal.     Musculoskeletal Exam:  Neck full ROM no tenderness Shoulders full ROM no tenderness or swelling Elbows full ROM no tenderness or swelling Wrists full ROM no tenderness or swelling Fingers full ROM no tenderness or swelling Hips normal internal and external range of  motion lateral pain provoked with palpation and with FABER maneuver bilaterally Knees bilateral patellofemoral crepitus with full range of motion no tenderness or effusions Ankles full ROM no tenderness or swelling   Investigation: No additional findings.  Imaging: No results found.  Recent Labs: Lab Results  Component Value Date   WBC 6.8 06/09/2020   HGB 14.4 06/09/2020   PLT 212 06/09/2020   NA 136 06/09/2020   K 4.4 06/09/2020   CL 104 06/09/2020   CO2 27 06/09/2020   GLUCOSE 96 06/09/2020   BUN 19 06/09/2020   CREATININE 0.63 06/09/2020   BILITOT 0.4 06/09/2020   ALKPHOS 56 06/09/2020   AST 32 06/09/2020   ALT 18 06/09/2020   PROT 6.7 06/09/2020   ALBUMIN 3.9 06/09/2020   CALCIUM 9.2 06/09/2020   GFRAA 109 11/09/2019    Speciality Comments: No specialty comments available.  Procedures:  No procedures performed Allergies: Zanaflex [tizanidine hcl], Morphine, and Oxycodone-acetaminophen   Assessment / Plan:     Visit Diagnoses: Positive ANA (antinuclear antibody) - Plan: ANA, C3 and C4, Urinalysis, Routine w reflex microscopic  Positive direct ANA with low positive anticentromere antibody titers.  Symptoms include fatigue, Raynaud's without ischemic injury or abnormal nailfold capillaries, nonspecific dysphagia so would not label with systemic connective tissue disease at this time.  We will check ANA by immunofluorescence for titer and pattern, serum complements, and urinalysis today.  Other fatigue  Generalized symptom work-up unremarkable so far she has had normal  TSH, normal iron studies, and normal blood count.  Symptoms likely somewhat multifactorial body habitus and deconditioning has been stable or improved compared to her previous during the past 8 months of her symptoms so does not explain this change.  Could also be related to perimenopausal hormonal changes.  However definitely aiming to rule out inflammatory or other system disease with follow-up testing for positive ANA today and would consider noninvasive cardiology evaluation.  Proteinuria, unspecified type Hematuria, unspecified type  Some history of hematuria and proteinuria though she denies any recent urinalysis is not having symptoms currently.  We will check screening UA for renal involvement with positive ANA and diffuse symptoms.  Numbness and tingling in both hands  Numbness or tingling in both hands sounds like radiating down the arms but has had normal nerve conduction study and no focal deficits at this time.  DYSPNEA Premature ventricular contractions  She has history of some dyspnea on exertion and some intermittent chest pain and palpitations apparently previous evaluation with PVCs a decade ago but no concerning findings.  Her exam remains normal today and symptoms are very mild but would consider cardiology referral for possible stress test or 2D echo considering the reported decreased exertional capacity fatigue and mild pedal edema that is new, in the setting of +ANA with +centromere Abs.  Orders: Orders Placed This Encounter  Procedures   ANA   C3 and C4   Urinalysis, Routine w reflex microscopic    No orders of the defined types were placed in this encounter.    Follow-Up Instructions: No follow-ups on file.   Collier Salina, MD  Note - This record has been created using [employer].  Chart creation errors have been sought, but may not always  have been located. Such creation errors do not reflect on  the standard of medical care.

## 2020-08-15 NOTE — Patient Instructions (Signed)
Antinuclear Antibody Test Why am I having this test? This is a test that is used to help diagnose systemic lupus erythematosus (SLE) and other autoimmune diseases. An autoimmune disease is a disease in which the body's own defense (immune)system attacks its organs. What is being tested? This test checks for antinuclear antibodies (ANA) in the blood. The presence of ANA is associated with several autoimmune diseases. It is seen in almost allpatients with lupus. What kind of sample is taken?  A blood sample is required for this test. It is usually collected by insertinga needle into a blood vessel. How are the results reported? Your test results will be reported as either positive or negative. A false-positive result can occur. A false positive is incorrect because itmeans that a condition is present when it is not. What do the results mean? A positive test result may mean that you have: Lupus. Other autoimmune diseases, such as rheumatoid arthritis, scleroderma, or Sjgren syndrome. Conditions that may cause a false-positive result include: Liver dysfunction. Myasthenia gravis. Infectious mononucleosis. Talk with your health care provider about what your results mean. Questions to ask your health care provider Ask your health care provider, or the department that is doing the test: When will my results be ready? How will I get my results? What are my treatment options? What other tests do I need? What are my next steps? Summary This is a test that is used to help diagnose systemic lupus erythematosus (SLE) and other autoimmune diseases. An autoimmune disease is a disease in which the body's own defense (immune)system attacks the body. This test checks for antinuclear antibodies (ANA) in the blood. The presence of ANA is associated with several autoimmune diseases. It is seen in almost all patients with lupus. Your test results will be reported as either positive or negative. Talk with  your health care provider about what your results mean.  Complement Assay Test Why am I having this test? Complement refers to a group of proteins that are part of the body's disease-fighting system (immune system). A complement assay test provides information about some or all of these proteins. You may have this test: To diagnose a lack, or deficiency, of certain complement proteins. Deficiencies can be passed from parent to child (inherited). To monitor an infection or autoimmune disease. If you have unexplained inflammation or swelling (edema). If you have bacterial infections again and again. What is being tested? This test can be used to measure: Total complement. This is the total number of protein complements in your blood. The number of each kind of complement in your blood. The nine main kinds of complement are labeled C1 through C9. Some of these complements, such as C3 and C4, are especially important and have many functions in the body. Depending on why you are having the test, your health care provider may test your total complement or only some individual complements, such as C3 and C4. The total complement assay test may be done before individual complements aretested. What kind of sample is taken?  A blood sample is required for this test. It is usually collected by insertinga needle into a blood vessel. Tell a health care provider about: Any allergies you have. All medicines you are taking, including vitamins, herbs, eye drops, creams, and over-the-counter medicines. Any blood disorders you have. Any surgeries you have had. Any medical conditions you have. Whether you are pregnant or may be pregnant. How are the results reported? Your results will be reported as a value  that tells you how much complement is in your blood. This will be given as units per milliliter of blood (units/mL) or as milligrams per deciliter of blood (mg/dL). Your results may be reportedas total  complement, or as individual complements, or both. Your health care provider will compare your results to normal ranges that were established after testing a large group of people (reference ranges). Reference ranges may vary among labs and hospitals. For this test, reference ranges for some of the most commonly measured complement assays may be: Total complement: 30-75 units/mL. C2: 1-4 mg/dL. C3: 75-175 mg/dL. C4: 22-45 units/mL. What do the results mean? Results within reference ranges are considered normal, which means you have anormal amount of complement in your blood. Results that are higher than the reference ranges may be caused by: Inflammatory disease. Heart attack. Cancer. Complement deficiencies, or results lower than the reference ranges, may be caused by: Certain inherited conditions. Autoimmune disease. Certain liver diseases. Malnutrition. Certain types of anemia that result in breakdown of red blood cells (hemolytic anemia). Talk with your health care provider about what your results mean. Questions to ask your health care provider Ask your health care provider, or the department that is doing the test: When will my results be ready? How will I get my results? What are my treatment options? What other tests do I need? What are my next steps? Summary Complement refers to a group of proteins that are part of the body's disease-fighting system (immune system). A complement assay test can provide information about some or all of these proteins. You may have a complement assay test to help diagnose a complement deficiency, and to monitor some infections or autoimmune disease. Talk with your health care provider about what your results mean.  Urinalysis Test Why am I having this test? You may have a urinalysis (UA) test: As part of routine wellness screening. Before surgery. During pregnancy. You may also need to have this test if you have: Kidney disease. Symptoms  of a urinary tract infection (UTI). Diabetes. A condition that causes an imbalance in your hormones. What is being tested? A urinalysis is a series of tests done on a sample of your urine. Your kidneys filter your blood to make urine. They get rid of waste products and save the important parts of your blood, such as proteins and minerals (electrolytes). You may need more testing if your UA shows too much: Protein. Sugar (glucose). Blood cells. Bacteria. What kind of sample is taken? A urine sample is required for this test. How do I collect samples at home? You usually collect a urine sample by urinating into a clean cup. You may be asked to collect a urine sample first thing in the morning. When collecting a urine sample at home, make sure you: Use supplies and instructions that you received from the lab. Collect urine only in the germ-free (sterile) cup that you received from the lab. Do not let any toilet paper or stool (feces) get into the cup. Refrigerate the sample until you can return it to the lab. Return the sample or samples to the lab as told. Tell a health care provider about: All medicines you are taking, including vitamins, herbs, eye drops, creams, and over-the-counter medicines. Any medical conditions you have. Whether you are pregnant or may be pregnant. What happens during the test? UA is divided into three parts: A visual exam of your urine sample to check for color or cloudiness. A dipstick test to check for:  Proteins. Concentration (specific gravity). Acidity (pH). Glucose. Ketones. These are by-products of your body burning fat for energy instead of sugar. A waste product from red blood cells (bilirubin). A product of white blood cells (leukocyte esterase). A product of bacteria (nitrite). Blood. A microscopic exam to check for: Red blood cells. White blood cells. Tube-shaped proteins (hyaline casts). Crystal structures. Bacteria. Epithelial cells.  These are cells that line your urinary tract. Yeast. How are the results reported? Some of your test results will be reported as values. Your health care provider will compare your results to normal ranges that were established after testing a large group of people (reference ranges). Reference ranges may vary among different labs and hospitals. For this test, common reference ranges are: pH: 4.6-8.0 (average, 6.0). Protein: 0-8 mg/dL. 50-80 mg/24 hr (at rest). Less than 250 mg/24 hr (during exercise). Specific gravity: Adult: 1.005-1.030 (usually, 1.010-1.025). Elderly: values decrease with age. Newborn: 1.001-1.020. Nitrites: none. Ketones: none. Bilirubin: none. Urobilinogen: [id number] Ehrlich unit/mL. Crystals: none. Casts: none. Glucose: Fresh specimen: none. 24-hour specimen: 50-300 mg/24 hr or 0.3-1.7 mmol/day (SI units). White blood cells (WBCs): 0-4 per low-power field. WBC casts: none. Red blood cells (RBCs): Less than or equal to 2. RBC casts: none. Epithelial cells: Few or 0-4 per low-power field. Bacteria: none. Yeast: none. Other results may be reported based on the appearance and odor of the sample. For this test, normal results are: Appearance: clear. Color: amber yellow. Odor: aromatic. Still other results may be reported as positive or negative. For this test, normal results are: Negative for leukocyte esterase. What do the results mean? Many conditions can cause abnormal UA results: Cloudy urine may be a sign of a UTI. Acetone odor may indicate a buildup of blood acids in people who have diabetes (diabetic ketoacidosis). Fecal odor can indicate an abnormal connection (fistula) between the intestine and the bladder. Ammonia odor can occur after a person holds urine in the bladder for too long. Pungent odor may indicate a UTI. Blood in the urine may be a sign of kidney disease, UTI, or other conditions. White blood cells may be a sign of a UTI. Crystals may  be a sign of a kidney stone or other kidney disease. High pH may mean you have a kidney stone, UTI, or kidney disease. Protein may be a sign of kidney disease, high blood pressure in pregnancy (toxemia), or other conditions. Glucose may be a sign of diabetes. Urobilinogen may be a sign of liver disease. Leukocyte esterase may indicate a UTI. Nitrites may indicate a UTI. Talk with your health care provider about what your results mean. Questions to ask your health care provider Ask your health care provider, or the department that is doing the test: When will my results be ready? How will I get my results? What are my treatment options? What other tests do I need? What are my next steps? Summary A urinalysis (UA) is a series of tests done on a sample of your urine. The test may be ordered as part of a routine exam, during pregnancy, before surgery, or if you have certain symptoms. The urinalysis is divided into three parts: a visual exam, a dipstick test, and a microscopic exam. Your health care provider will compare your results to normal ranges that were established after testing a large group of people (reference ranges). Talk with your health care provider about what your results mean. This information is not intended to replace advice given to you by your health care  provider. Make sure you discuss any questions you have with your healthcare provider. Document Revised: 08/17/2019 Document Reviewed: 08/17/2019 Elsevier Patient Education  [place].

## 2020-08-17 LAB — URINALYSIS, ROUTINE W REFLEX MICROSCOPIC
Bilirubin Urine: NEGATIVE
Glucose, UA: NEGATIVE
Hgb urine dipstick: NEGATIVE
Ketones, ur: NEGATIVE
Leukocytes,Ua: NEGATIVE
Nitrite: NEGATIVE
Protein, ur: NEGATIVE
Specific Gravity, Urine: 1.026 (ref 1.001–1.035)
pH: 6 (ref 5.0–8.0)

## 2020-08-17 LAB — C3 AND C4
C3 Complement: 142 mg/dL (ref 83–193)
C4 Complement: 35 mg/dL (ref 15–57)

## 2020-08-17 LAB — ANTI-NUCLEAR AB-TITER (ANA TITER): ANA Titer 1: 1:80 {titer} — ABNORMAL HIGH

## 2020-08-17 LAB — ANA: Anti Nuclear Antibody (ANA): POSITIVE — AB

## 2020-08-23 ENCOUNTER — Encounter: Payer: Self-pay | Admitting: Internal Medicine

## 2020-08-24 ENCOUNTER — Telehealth: Payer: Self-pay | Admitting: Gastroenterology

## 2020-08-24 DIAGNOSIS — K625 Hemorrhage of anus and rectum: Secondary | ICD-10-CM

## 2020-08-24 DIAGNOSIS — R194 Change in bowel habit: Secondary | ICD-10-CM

## 2020-08-24 DIAGNOSIS — R1030 Lower abdominal pain, unspecified: Secondary | ICD-10-CM

## 2020-08-24 DIAGNOSIS — R1310 Dysphagia, unspecified: Secondary | ICD-10-CM

## 2020-08-24 DIAGNOSIS — R197 Diarrhea, unspecified: Secondary | ICD-10-CM

## 2020-08-24 NOTE — Telephone Encounter (Signed)
Please arrange for TCS/EGD/ED with propofol. ASA II. She is a [facility] patient. Let me know how far out she is scheduled. Patient aware, see mychart message.  Dx: chronic diarrhea, change in bowels, rectal bleeding, abd pain, dysphagia.   SHE WILL NEED TO HOLD OZEMPIC 10 DAYS PRIOR TO PROCEDURE.

## 2020-08-25 NOTE — Telephone Encounter (Signed)
We are currently awaiting Dr. Roseanne Kaufman October schedule.

## 2020-08-26 NOTE — Telephone Encounter (Signed)
Lab tests are benign. 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 No evidence of kidney problems. I believe a cardiology evaluation to check possible stress testing or heart ultrasound would be a good idea with her symptoms and these results, if she is okay being referred for this.

## 2020-08-27 ENCOUNTER — Other Ambulatory Visit: Payer: Self-pay | Admitting: Radiology

## 2020-08-27 DIAGNOSIS — R200 Anesthesia of skin: Secondary | ICD-10-CM

## 2020-08-27 DIAGNOSIS — I493 Ventricular premature depolarization: Secondary | ICD-10-CM

## 2020-08-27 DIAGNOSIS — R0602 Shortness of breath: Secondary | ICD-10-CM

## 2020-08-27 NOTE — Telephone Encounter (Signed)
Thanks

## 2020-08-29 NOTE — Telephone Encounter (Signed)
Please make sure pt is aware. Let me know if she wants to have carver do procedure to expedite work up or wait until 10/2020. Whichever she prefers.

## 2020-08-29 NOTE — Telephone Encounter (Signed)
Message sent to pt.

## 2020-09-02 NOTE — Telephone Encounter (Signed)
Spoke with Anderson Malta and she is fine with waiting for Oct for Dr. Gala Romney schedule and requests a Friday. We will call her

## 2020-09-03 NOTE — Telephone Encounter (Signed)
Patient sent mychart message requesting Dr. Abbey Chatters. Offered dates and she has been scheduled for 9/6 at 7:30am. Instructions sent via mychart along with informing needs urine preg done before procedure. Clenpiq left at front desk for pick up with instructions.

## 2020-09-03 NOTE — Addendum Note (Signed)
Addended by: Cheron Every on: 09/03/2020 02:32 PM   Modules accepted: Orders

## 2020-09-16 ENCOUNTER — Ambulatory Visit: Payer: Federal, State, Local not specified - PPO | Admitting: Gastroenterology

## 2020-09-17 ENCOUNTER — Other Ambulatory Visit (HOSPITAL_COMMUNITY): Payer: Self-pay | Admitting: Internal Medicine

## 2020-09-17 DIAGNOSIS — Z1231 Encounter for screening mammogram for malignant neoplasm of breast: Secondary | ICD-10-CM

## 2020-09-22 ENCOUNTER — Other Ambulatory Visit (HOSPITAL_COMMUNITY)
Admission: RE | Admit: 2020-09-22 | Discharge: 2020-09-22 | Disposition: A | Discharge status: Home / Self Care | Payer: Federal, State, Local not specified - PPO | Source: Ambulatory Visit | Attending: Internal Medicine | Admitting: Internal Medicine

## 2020-09-22 DIAGNOSIS — K529 Noninfective gastroenteritis and colitis, unspecified: Secondary | ICD-10-CM | POA: Diagnosis not present

## 2020-09-22 DIAGNOSIS — R194 Change in bowel habit: Secondary | ICD-10-CM

## 2020-09-22 DIAGNOSIS — K2970 Gastritis, unspecified, without bleeding: Secondary | ICD-10-CM | POA: Diagnosis not present

## 2020-09-22 DIAGNOSIS — Z888 Allergy status to other drugs, medicaments and biological substances status: Secondary | ICD-10-CM | POA: Diagnosis not present

## 2020-09-22 DIAGNOSIS — K625 Hemorrhage of anus and rectum: Secondary | ICD-10-CM | POA: Insufficient documentation

## 2020-09-22 DIAGNOSIS — D123 Benign neoplasm of transverse colon: Secondary | ICD-10-CM | POA: Diagnosis not present

## 2020-09-22 DIAGNOSIS — R1030 Lower abdominal pain, unspecified: Secondary | ICD-10-CM

## 2020-09-22 DIAGNOSIS — K219 Gastro-esophageal reflux disease without esophagitis: Secondary | ICD-10-CM | POA: Diagnosis not present

## 2020-09-22 DIAGNOSIS — K449 Diaphragmatic hernia without obstruction or gangrene: Secondary | ICD-10-CM | POA: Diagnosis not present

## 2020-09-22 DIAGNOSIS — R197 Diarrhea, unspecified: Secondary | ICD-10-CM

## 2020-09-22 DIAGNOSIS — K648 Other hemorrhoids: Secondary | ICD-10-CM | POA: Diagnosis not present

## 2020-09-22 DIAGNOSIS — Z885 Allergy status to narcotic agent status: Secondary | ICD-10-CM | POA: Diagnosis not present

## 2020-09-22 DIAGNOSIS — R1310 Dysphagia, unspecified: Secondary | ICD-10-CM

## 2020-09-22 LAB — PREGNANCY, URINE: Preg Test, Ur: NEGATIVE

## 2020-09-23 ENCOUNTER — Ambulatory Visit (HOSPITAL_COMMUNITY): Payer: Federal, State, Local not specified - PPO | Admitting: Anesthesiology

## 2020-09-23 ENCOUNTER — Encounter (HOSPITAL_COMMUNITY)
Admission: RE | Disposition: A | Discharge status: Home / Self Care | Payer: Self-pay | Source: Home / Self Care | Attending: Internal Medicine

## 2020-09-23 ENCOUNTER — Other Ambulatory Visit: Payer: Self-pay

## 2020-09-23 ENCOUNTER — Encounter (HOSPITAL_COMMUNITY): Payer: Self-pay

## 2020-09-23 ENCOUNTER — Ambulatory Visit (HOSPITAL_COMMUNITY)
Admission: RE | Admit: 2020-09-23 | Discharge: 2020-09-23 | Disposition: A | Discharge status: Home / Self Care | Payer: Federal, State, Local not specified - PPO | Attending: Internal Medicine | Admitting: Internal Medicine

## 2020-09-23 DIAGNOSIS — R109 Unspecified abdominal pain: Secondary | ICD-10-CM | POA: Diagnosis not present

## 2020-09-23 DIAGNOSIS — Z888 Allergy status to other drugs, medicaments and biological substances status: Secondary | ICD-10-CM | POA: Diagnosis not present

## 2020-09-23 DIAGNOSIS — R1310 Dysphagia, unspecified: Secondary | ICD-10-CM

## 2020-09-23 DIAGNOSIS — K449 Diaphragmatic hernia without obstruction or gangrene: Secondary | ICD-10-CM | POA: Insufficient documentation

## 2020-09-23 DIAGNOSIS — K529 Noninfective gastroenteritis and colitis, unspecified: Secondary | ICD-10-CM | POA: Diagnosis not present

## 2020-09-23 DIAGNOSIS — K635 Polyp of colon: Secondary | ICD-10-CM | POA: Diagnosis not present

## 2020-09-23 DIAGNOSIS — Z885 Allergy status to narcotic agent status: Secondary | ICD-10-CM | POA: Diagnosis not present

## 2020-09-23 DIAGNOSIS — F32A Depression, unspecified: Secondary | ICD-10-CM | POA: Diagnosis not present

## 2020-09-23 DIAGNOSIS — D123 Benign neoplasm of transverse colon: Secondary | ICD-10-CM | POA: Diagnosis not present

## 2020-09-23 DIAGNOSIS — R194 Change in bowel habit: Secondary | ICD-10-CM | POA: Diagnosis not present

## 2020-09-23 DIAGNOSIS — K219 Gastro-esophageal reflux disease without esophagitis: Secondary | ICD-10-CM | POA: Diagnosis not present

## 2020-09-23 DIAGNOSIS — K2970 Gastritis, unspecified, without bleeding: Secondary | ICD-10-CM | POA: Insufficient documentation

## 2020-09-23 DIAGNOSIS — K648 Other hemorrhoids: Secondary | ICD-10-CM | POA: Diagnosis not present

## 2020-09-23 DIAGNOSIS — K625 Hemorrhage of anus and rectum: Secondary | ICD-10-CM | POA: Diagnosis not present

## 2020-09-23 HISTORY — PX: COLONOSCOPY WITH PROPOFOL: SHX5780

## 2020-09-23 HISTORY — PX: ESOPHAGOGASTRODUODENOSCOPY (EGD) WITH PROPOFOL: SHX5813

## 2020-09-23 HISTORY — PX: BALLOON DILATION: SHX5330

## 2020-09-23 HISTORY — PX: BIOPSY: SHX5522

## 2020-09-23 HISTORY — PX: POLYPECTOMY: SHX5525

## 2020-09-23 SURGERY — COLONOSCOPY WITH PROPOFOL
Anesthesia: General

## 2020-09-23 MED ORDER — PROPOFOL 1000 MG/100ML IV EMUL
INTRAVENOUS | Status: AC
  Filled 2020-09-23: qty 100

## 2020-09-23 MED ORDER — LACTATED RINGERS IV SOLN: INTRAVENOUS | Status: DC

## 2020-09-23 MED ORDER — PROPOFOL 500 MG/50ML IV EMUL
INTRAVENOUS | Status: DC | PRN
  Administered 2020-09-23: 150 ug/kg/min via INTRAVENOUS

## 2020-09-23 MED ORDER — LIDOCAINE HCL (CARDIAC) PF 100 MG/5ML IV SOSY
PREFILLED_SYRINGE | INTRAVENOUS | Status: DC | PRN
  Administered 2020-09-23: 50 mg via INTRAVENOUS

## 2020-09-23 MED ORDER — LIDOCAINE HCL (PF) 2 % IJ SOLN
INTRAMUSCULAR | Status: AC
  Filled 2020-09-23: qty 5

## 2020-09-23 MED ORDER — PROPOFOL 10 MG/ML IV BOLUS
INTRAVENOUS | Status: DC | PRN
  Administered 2020-09-23: 150 mg via INTRAVENOUS
  Administered 2020-09-23: 50 mg via INTRAVENOUS

## 2020-09-23 MED ORDER — OMEPRAZOLE 20 MG PO CPDR: 20.0000 mg | DELAYED_RELEASE_CAPSULE | Freq: Two times a day (BID) | ORAL | 5 refills | Status: DC

## 2020-09-23 NOTE — Anesthesia Procedure Notes (Signed)
Date/Time: 09/23/2020 8:06 AM Performed by: Orlie Dakin, CRNA Pre-anesthesia Checklist: Patient identified, Emergency Drugs available, Suction available and Patient being monitored Patient Re-evaluated:Patient Re-evaluated prior to induction Oxygen Delivery Method: Nasal cannula Induction Type: IV induction Placement Confirmation: positive ETCO2

## 2020-09-23 NOTE — Anesthesia Preprocedure Evaluation (Addendum)
Anesthesia Evaluation  Patient identified by MRN, date of birth, ID band Patient awake    Reviewed: Allergy & Precautions, NPO status , Patient's Chart, lab work & pertinent test results  Airway Mallampati: II  TM Distance: >3 FB Neck ROM: Full    Dental  (+) Dental Advisory Given, Teeth Intact   Pulmonary shortness of breath and with exertion,    Pulmonary exam normal breath sounds clear to auscultation       Cardiovascular Exercise Tolerance: Good + dysrhythmias (PVCs)  Rhythm:Regular Rate:Normal  Occasional PVCs   Neuro/Psych  Headaches, PSYCHIATRIC DISORDERS Depression  Neuromuscular disease (numbness and tingling in both hands)    GI/Hepatic Neg liver ROS, hiatal hernia, GERD  Controlled and Medicated,  Endo/Other    Renal/GU Renal disease (stones)     Musculoskeletal  (+) Arthritis  (back pain, Joint pains, positive ANA),   Abdominal   Peds  Hematology   Anesthesia Other Findings Cervical lymphadenopathy  Positive ANA  Reproductive/Obstetrics                           Anesthesia Physical Anesthesia Plan  ASA: 3  Anesthesia Plan: General   Post-op Pain Management:    Induction: Intravenous  PONV Risk Score and Plan: Propofol infusion  Airway Management Planned: Nasal Cannula and Natural Airway  Additional Equipment:   Intra-op Plan:   Post-operative Plan:   Informed Consent: I have reviewed the patients History and Physical, chart, labs and discussed the procedure including the risks, benefits and alternatives for the proposed anesthesia with the patient or authorized representative who has indicated his/her understanding and acceptance.     Dental advisory given  Plan Discussed with: CRNA and Surgeon  Anesthesia Plan Comments:         Anesthesia Quick Evaluation

## 2020-09-23 NOTE — H&P (Signed)
Primary Care Physician:  Celene Squibb, MD Primary Gastroenterologist:  Dr. Abbey Chatters  Pre-Procedure History & Physical: HPI:  Meghan [name] is a 50 y.o. female is here for an EGD with possible dilation for GERD, dysphagia and a colonoscopy for abdominal pain, diarrhea, remote history of colitis.   Past Medical History:  Diagnosis Date   Abdominal pain    RLQ   Back pain    Chest pain, unspecified    Colitis    see psh for colonoscopy reports   Constipation    Dyspnea    Endometrial polyp 11/09/2012   Enlarged lymph node    right side - arm   Family history of ovarian cancer    MGM   Generalized headaches    GERD (gastroesophageal reflux disease)    Hemorrhoids    Hiatal hernia    Hip pain    IBS (irritable bowel syndrome)    IBS (irritable bowel syndrome)    Insomnia    Irregular bleeding 01/01/2013   Menorrhagia 10/17/2012   Migraines    Nausea    Nephrolithiasis    Obesity, unspecified    Palpitations    PVC (premature ventricular contraction)    Vitamin D deficiency     Past Surgical History:  Procedure Laterality Date   ABLATION     Uterine   COLONOSCOPY  2005   Dr. Irving Shows. Colitis of cecum and ICV, bx nonspecific   COLONOSCOPY N/A 02/10/2015   Minimal internal hemorrhoids, otherwise normal-appearing rectal mucosa. Normal-appearing colonic mucosa. Distal 10 cm of TI also appeared normal. Segmental biopsies taken benign.    colonoscopy with ileoscopy  2008   Dr. Laural Golden. patch of coarse mucosa at TI, bx  neg.  Random colon bx showed minimal active focal colitis ?resolving infection. Felt not c/w IBD.   DILITATION & CURRETTAGE/HYSTROSCOPY WITH THERMACHOICE ABLATION N/A 12/01/2012   Procedure: DILATATION & CURETTAGE;REMOVAL OF ENDOMETRIAL POLYP; HYSTEROSCOPY WITH THERMACHOICE ENDOMETRIAL ABLATION;  Surgeon: Jonnie Kind, MD;  Location: [facility];  Service: Gynecology;  Laterality: N/A;   ESOPHAGOGASTRODUODENOSCOPY  02/2008   Dr. Gala Romney- birnak appearing tubular  esophagus. small hiatal hernia, o/w normal stomach, D1, D2   KIDNEY STONE SURGERY     KNEE ARTHROSCOPY  2008   ACL repair   LAPAROSCOPIC BILATERAL SALPINGECTOMY Bilateral 12/01/2012   Procedure: LAPAROSCOPIC BILATERAL SALPINGECTOMY ;  Surgeon: Jonnie Kind, MD;  Location: [facility];  Service: Gynecology;  Laterality: Bilateral;   LESION REMOVAL Left 12/01/2012   Procedure: SKIN TAG REMOVAL (Left inner thigh) ;  Surgeon: Jonnie Kind, MD;  Location: [facility];  Service: Gynecology;  Laterality: Left;    Prior to Admission medications   Medication Sig Start Date End Date Taking? Authorizing Provider  Semaglutide, 1 MG/DOSE, (OZEMPIC, 1 MG/DOSE,) 4 MG/3ML SOPN Inject 1 mg into the skin once a week.   Yes [provider]    Allergies as of 09/03/2020 - Review Complete 08/15/2020  Allergen Reaction Noted   Zanaflex [tizanidine hcl] Hives 01/07/2017   Morphine Other (See Comments) 08/01/2013   Oxycodone-acetaminophen Hives and Itching 12/01/2012    Family History  Problem Relation Age of Onset   Scleroderma Mother    Sleep apnea Mother    Obesity Mother    Hypertension Father    Cancer Father        liver   Cirrhosis Father        non-alcoholic   Obesity Father    Other Sister  Transverse myelitis   Cancer Maternal Grandmother        ovarian cancer   Diabetes Maternal Grandfather    Stroke Paternal Grandmother    Hypertension Paternal Grandmother    Colon cancer Neg Hx    Colon polyps Neg Hx     Social History   Socioeconomic History   Marital status: Married    Spouse name: Keenan Bachelor   Number of children: 2   Years of education: 14   Highest education level: Not on file  Occupational History   Occupation: radiology CT Archivist    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   Patient works full-time at [employer].   Patient has a Associates degree.   Patient is right-handed.   Patient drinks tea occasionally.      Social Determinants of Health   Financial Resource Strain: Not on file  Food Insecurity: Not on file  Transportation Needs: Not on file  Physical Activity: Not on file  Stress: Not on file  Social Connections: Not on file  Intimate Partner Violence: Not on file    Review of Systems: See HPI, otherwise negative ROS  Physical Exam: Vital signs in last 24 hours: Temp:  [98.5 F (36.9 C)] 98.5 F (36.9 C) (09/06 0648) Pulse Rate:  [73] 73 (09/06 0648) Resp:  [13] 13 (09/06 0648) BP: (139)/(79) 139/79 (09/06 0648) SpO2:  [95 %] 95 % (09/06 0648) Weight:  [121.6 kg] 121.6 kg (09/06 0648)   General:   Alert,  Well-developed, well-nourished, pleasant and cooperative in NAD Head:  Normocephalic and atraumatic. Eyes:  Sclera clear, no icterus.   Conjunctiva pink. Ears:  Normal auditory acuity. Nose:  No deformity, discharge,  or lesions. Mouth:  No deformity or lesions, dentition normal. Neck:  Supple; no masses or thyromegaly. Lungs:  Clear throughout to auscultation.   No wheezes, crackles, or rhonchi. No acute distress. Heart:  Regular rate and rhythm; no murmurs, clicks, rubs,  or gallops. Abdomen:  Soft, nontender and nondistended. No masses, hepatosplenomegaly or hernias noted. Normal bowel sounds, without guarding, and without rebound.   Msk:  Symmetrical without gross deformities. Normal posture. Extremities:  Without clubbing or  edema. Neurologic:  Alert and  oriented x4;  grossly normal neurologically. Skin:  Intact without significant lesions or rashes. Cervical Nodes:  No significant cervical adenopathy. Psych:  Alert and cooperative. Normal mood and affect.  Impression/Plan: Meghan [name] is here for an EGD with possible dilation for GERD, dysphagia and a colonoscopy for abdominal pain, diarrhea, remote history of colitis.   The risks of the procedure including infection, bleed, or perforation as well as benefits, limitations, alternatives and imponderables have been reviewed with the patient. Questions have been answered. All parties agreeable.

## 2020-09-23 NOTE — Op Note (Signed)
[facility] Patient Name: Meghan [name] Procedure Date: 09/23/2020 7:07 AM MRN: [id number] Date of Birth: 1970-08-01 Attending MD: Elon Alas. Abbey Chatters DO CSN: [id number] Age: 50 Admit Type: Outpatient Procedure:                Upper GI endoscopy Indications:              Dysphagia, Heartburn Providers:                Elon Alas. Abbey Chatters, DO, Janeece Riggers, RN, Aram Candela Referring MD:              Medicines:                See the Anesthesia note for documentation of the                            administered medications Complications:            No immediate complications. Estimated Blood Loss:     Estimated blood loss was minimal. Procedure:                Pre-Anesthesia Assessment:                           - The anesthesia plan was to use monitored                            anesthesia care (MAC).                           After obtaining informed consent, the endoscope was                            passed under direct vision. Throughout the                            procedure, the patient's blood pressure, pulse, and                            oxygen saturations were monitored continuously. The                            [id number]-H190 ([id number]) scope was introduced through the                            mouth, and advanced to the second part of duodenum.                            The upper GI endoscopy was accomplished without                            difficulty. The patient tolerated the procedure                            well. Scope In: 7:53:20 AM Scope Out: 2:40:97 AM Total Procedure Duration: 0 hours 4 minutes 51 seconds  Findings:      A small hiatal hernia was present.  No endoscopic abnormality was evident in the esophagus to explain the       patient's complaint of dysphagia. Preparations were made for empiric       dilation. A TTS dilator was passed through the scope. Dilation with an       18-19-20 mm balloon dilator was performed to 20 mm. Dilation was        performed with a mild resistance at 20 mm. Estimated blood loss was none.      Localized mild inflammation characterized by erythema was found in the       gastric antrum. Biopsies were taken with a cold forceps for Helicobacter       pylori testing.      The duodenal bulb, first portion of the duodenum and second portion of       the duodenum were normal. Impression:               - Small hiatal hernia.                           - Gastritis. Biopsied.                           - Normal duodenal bulb, first portion of the                            duodenum and second portion of the duodenum. Moderate Sedation:      Per Anesthesia Care Recommendation:           - Patient has a contact number available for                            emergencies. The signs and symptoms of potential                            delayed complications were discussed with the                            patient. Return to normal activities tomorrow.                            Written discharge instructions were provided to the                            patient.                           - Resume previous diet.                           - Continue present medications.                           - Await pathology results.                           - Repeat upper endoscopy PRN for retreatment.                           -  Use Prilosec (omeprazole) 20 mg PO BID for 12                            weeks then decrease to once daily. Can take Pepcid                            on top of this for breakthrough symptoms                           - No ibuprofen, naproxen, or other non-steroidal                            anti-inflammatory drugs.                           - Return to GI clinic in 3 months. If symptoms not                            improved s/p dilation and PPI therapy, consider                            esophageal manometry Procedure Code(s):        --- Professional ---                            [phone], Esophagogastroduodenoscopy, flexible,                            transoral; with biopsy, single or multiple Diagnosis Code(s):        --- Professional ---                           K44.9, Diaphragmatic hernia without obstruction or                            gangrene                           K29.70, Gastritis, unspecified, without bleeding                           R13.10, Dysphagia, unspecified                           R12, Heartburn CPT copyright 2019 American Medical Association. All rights reserved. The codes documented in this report are preliminary and upon coder review may  be revised to meet current compliance requirements. Elon Alas. Abbey Chatters, DO [place], DO 09/23/2020 8:01:20 AM This report has been signed electronically. Number of Addenda: 0

## 2020-09-23 NOTE — Anesthesia Postprocedure Evaluation (Signed)
Anesthesia Post Note  Patient: Meghan [name]  Procedure(s) Performed: COLONOSCOPY WITH PROPOFOL ESOPHAGOGASTRODUODENOSCOPY (EGD) WITH PROPOFOL BALLOON DILATION BIOPSY POLYPECTOMY  Patient location during evaluation: Endoscopy Anesthesia Type: General Level of consciousness: awake and alert and oriented Pain management: pain level controlled Vital Signs Assessment: post-procedure vital signs reviewed and stable Respiratory status: spontaneous breathing and respiratory function stable Cardiovascular status: blood pressure returned to baseline and stable Postop Assessment: no apparent nausea or vomiting Anesthetic complications: no   No notable events documented.   Last Vitals:  Vitals:   09/23/20 0648 09/23/20 0824  BP: 139/79 126/72  Pulse: 73   Resp: 13 19  Temp: 36.9 C 36.4 C  SpO2: 95% 96%    Last Pain:  Vitals:   09/23/20 0824  TempSrc: Oral  PainSc: 5                  Jaeson Molstad C Zair Borawski

## 2020-09-23 NOTE — Discharge Instructions (Addendum)
EGD Discharge instructions Please read the instructions outlined below and refer to this sheet in the next few weeks. These discharge instructions provide you with general information on caring for yourself after you leave the hospital. Your doctor may also give you specific instructions. While your treatment has been planned according to the most current medical practices available, unavoidable complications occasionally occur. If you have any problems or questions after discharge, please call your doctor. ACTIVITY You may resume your regular activity but move at a slower pace for the next 24 hours.  Take frequent rest periods for the next 24 hours.  Walking will help expel (get rid of) the air and reduce the bloated feeling in your abdomen.  No driving for 24 hours (because of the anesthesia (medicine) used during the test).  You may shower.  Do not sign any important legal documents or operate any machinery for 24 hours (because of the anesthesia used during the test).  NUTRITION Drink plenty of fluids.  You may resume your normal diet.  Begin with a light meal and progress to your normal diet.  Avoid alcoholic beverages for 24 hours or as instructed by your caregiver.  MEDICATIONS You may resume your normal medications unless your caregiver tells you otherwise.  WHAT YOU CAN EXPECT TODAY You may experience abdominal discomfort such as a feeling of fullness or "gas" pains.  FOLLOW-UP Your doctor will discuss the results of your test with you.  SEEK IMMEDIATE MEDICAL ATTENTION IF ANY OF THE FOLLOWING OCCUR: Excessive nausea (feeling sick to your stomach) and/or vomiting.  Severe abdominal pain and distention (swelling).  Trouble swallowing.  Temperature over 101 F (37.8 C).  Rectal bleeding or vomiting of blood.     Colonoscopy Discharge Instructions  Read the instructions outlined below and refer to this sheet in the next few weeks. These discharge instructions provide you with  general information on caring for yourself after you leave the hospital. Your doctor may also give you specific instructions. While your treatment has been planned according to the most current medical practices available, unavoidable complications occasionally occur.   ACTIVITY You may resume your regular activity, but move at a slower pace for the next 24 hours.  Take frequent rest periods for the next 24 hours.  Walking will help get rid of the air and reduce the bloated feeling in your belly (abdomen).  No driving for 24 hours (because of the medicine (anesthesia) used during the test).   Do not sign any important legal documents or operate any machinery for 24 hours (because of the anesthesia used during the test).  NUTRITION Drink plenty of fluids.  You may resume your normal diet as instructed by your doctor.  Begin with a light meal and progress to your normal diet. Heavy or fried foods are harder to digest and may make you feel sick to your stomach (nauseated).  Avoid alcoholic beverages for 24 hours or as instructed.  MEDICATIONS You may resume your normal medications unless your doctor tells you otherwise.  WHAT YOU CAN EXPECT TODAY Some feelings of bloating in the abdomen.  Passage of more gas than usual.  Spotting of blood in your stool or on the toilet paper.  IF YOU HAD POLYPS REMOVED DURING THE COLONOSCOPY: No aspirin products for 7 days or as instructed.  No alcohol for 7 days or as instructed.  Eat a soft diet for the next 24 hours.  FINDING OUT THE RESULTS OF YOUR TEST Not all test results are  available during your visit. If your test results are not back during the visit, make an appointment with your caregiver to find out the results. Do not assume everything is normal if you have not heard from your caregiver or the medical facility. It is important for you to follow up on all of your test results.  SEEK IMMEDIATE MEDICAL ATTENTION IF: You have more than a spotting of  blood in your stool.  Your belly is swollen (abdominal distention).  You are nauseated or vomiting.  You have a temperature over 101.  You have abdominal pain or discomfort that is severe or gets worse throughout the day.   Your EGD revealed a mild amount of inflammation in your stomach.  I took biopsies of this to rule out infection with a bacteria called H. pylori.  You do have a small hiatal hernia.  You also had a slight narrowing of your esophagus and I stretched this with a balloon today.  Hopefully this helps with your swallowing.  I am going to start you on omeprazole 20 mg twice daily.  This medication works best if you take it 30 minutes before breakfast and 30 minutes before dinner.  Await pathology results, my office will contact you.  Your colon looked very healthy today.  I did not find any active inflammation indicative of underlying Crohn's disease or ulcerative colitis.  I did take biopsies to rule out a condition called microscopic colitis which can cause diarrhea and abdominal pain.  You had 1 small polyp which I removed.  This was very small.  If precancerous, we will plan a repeat colonoscopy in 5 years, otherwise you will be good for 10 years.  Follow-up with GI and 3 to 4 months.  I hope you have a great rest of your week!  Elon Alas. Abbey Chatters, D.O. [facility]astro[facility]ology and Hepatology [facility]

## 2020-09-23 NOTE — Transfer of Care (Signed)
Immediate Anesthesia Transfer of Care Note  Patient: Meghan [name]  Procedure(s) Performed: COLONOSCOPY WITH PROPOFOL ESOPHAGOGASTRODUODENOSCOPY (EGD) WITH PROPOFOL BALLOON DILATION BIOPSY POLYPECTOMY  Patient Location: Endoscopy Unit  Anesthesia Type:General  Level of Consciousness: awake and alert   Airway & Oxygen Therapy: Patient Spontanous Breathing  Post-op Assessment: Report given to RN, Post -op Vital signs reviewed and stable and Patient moving all extremities X 4  Post vital signs: Reviewed and stable  Last Vitals:  Vitals Value Taken Time  BP    Temp    Pulse    Resp    SpO2      Last Pain:  Vitals:   09/23/20 0648  TempSrc: Oral  PainSc: 0-No pain      Patients Stated Pain Goal: 9 ([id number])  Complications: No notable events documented.

## 2020-09-23 NOTE — Op Note (Signed)
[facility] Patient Name: Meghan [name] Procedure Date: 09/23/2020 8:00 AM MRN: [id number] Date of Birth: October 17, 1970 Attending MD: Elon Alas. Abbey Chatters DO CSN: [id number] Age: 50 Admit Type: Outpatient Procedure:                Colonoscopy Indications:              Lower abdominal pain, Chronic diarrhea Providers:                Elon Alas. Abbey Chatters, DO, Janeece Riggers, RN, Aram Candela Referring MD:              Medicines:                See the Anesthesia note for documentation of the                            administered medications Complications:            No immediate complications. Estimated Blood Loss:     Estimated blood loss was minimal. Procedure:                Pre-Anesthesia Assessment:                           - The anesthesia plan was to use monitored                            anesthesia care (MAC).                           After obtaining informed consent, the colonoscope                            was passed under direct vision. Throughout the                            procedure, the patient's blood pressure, pulse, and                            oxygen saturations were monitored continuously. The                            [id number]-HQ190L ([id number]) scope was introduced through                            the anus and advanced to the the cecum, identified                            by appendiceal orifice and ileocecal valve. The                            colonoscopy was performed without difficulty. The                            patient tolerated the procedure well. The quality  of the bowel preparation was evaluated using the                            BBPS [facility] Bowel Preparation Scale) with scores                            of: Right Colon = 3, Transverse Colon = 3 and Left                            Colon = 3 (entire mucosa seen well with no residual                            staining, small fragments of stool or opaque                             liquid). The total BBPS score equals 9. Scope In: 8:02:02 AM Scope Out: 8:21:32 AM Scope Withdrawal Time: 0 hours 12 minutes 4 seconds  Total Procedure Duration: 0 hours 19 minutes 30 seconds  Findings:      The perianal and digital rectal examinations were normal.      Non-bleeding internal hemorrhoids were found during retroflexion.      A 2 mm polyp was found in the transverse colon. The polyp was sessile.       The polyp was removed with a cold biopsy forceps. Resection and       retrieval were complete.      Biopsies for histology were taken with a cold forceps from the ascending       colon, transverse colon and descending colon for evaluation of       microscopic colitis.      The terminal ileum appeared normal. Impression:               - Non-bleeding internal hemorrhoids.                           - One 2 mm polyp in the transverse colon, removed                            with a cold biopsy forceps. Resected and retrieved.                           - The examined portion of the ileum was normal.                           - Biopsies were taken with a cold forceps from the                            ascending colon, transverse colon and descending                            colon for evaluation of microscopic colitis. Moderate Sedation:      Per Anesthesia Care Recommendation:           - Patient has a contact number available for  emergencies. The signs and symptoms of potential                            delayed complications were discussed with the                            patient. Return to normal activities tomorrow.                            Written discharge instructions were provided to the                            patient.                           - Resume previous diet.                           - Continue present medications.                           - Await pathology results.                           - Repeat  colonoscopy in 5 years for surveillance.                           - Return to GI clinic in 3 months. Procedure Code(s):        --- Professional ---                           [phone], Colonoscopy, flexible; with biopsy, single                            or multiple Diagnosis Code(s):        --- Professional ---                           K63.5, Polyp of colon                           K64.8, Other hemorrhoids                           R10.30, Lower abdominal pain, unspecified                           K52.9, Noninfective gastroenteritis and colitis,                            unspecified CPT copyright 2019 American Medical Association. All rights reserved. The codes documented in this report are preliminary and upon coder review may  be revised to meet current compliance requirements. Elon Alas. Abbey Chatters, DO [place], DO 09/23/2020 8:31:10 AM This report has been signed electronically. Number of Addenda: 0

## 2020-09-24 ENCOUNTER — Ambulatory Visit (HOSPITAL_COMMUNITY): Payer: Federal, State, Local not specified - PPO

## 2020-09-24 LAB — SURGICAL PATHOLOGY

## 2020-09-25 ENCOUNTER — Ambulatory Visit (HOSPITAL_COMMUNITY): Payer: Federal, State, Local not specified - PPO

## 2020-09-29 ENCOUNTER — Encounter (HOSPITAL_COMMUNITY): Payer: Self-pay | Admitting: Internal Medicine

## 2020-10-01 ENCOUNTER — Other Ambulatory Visit: Payer: Self-pay

## 2020-10-01 ENCOUNTER — Ambulatory Visit (HOSPITAL_COMMUNITY)
Admission: RE | Admit: 2020-10-01 | Discharge: 2020-10-01 | Disposition: A | Discharge status: Home / Self Care | Payer: Federal, State, Local not specified - PPO | Source: Ambulatory Visit | Attending: Internal Medicine | Admitting: Internal Medicine

## 2020-10-01 DIAGNOSIS — Z1231 Encounter for screening mammogram for malignant neoplasm of breast: Secondary | ICD-10-CM | POA: Diagnosis not present

## 2020-10-22 ENCOUNTER — Other Ambulatory Visit (HOSPITAL_COMMUNITY): Payer: Self-pay | Admitting: Family Medicine

## 2020-10-22 ENCOUNTER — Other Ambulatory Visit: Payer: Self-pay

## 2020-10-22 ENCOUNTER — Ambulatory Visit (HOSPITAL_COMMUNITY)
Admission: RE | Admit: 2020-10-22 | Discharge: 2020-10-22 | Disposition: A | Discharge status: Home / Self Care | Payer: Federal, State, Local not specified - PPO | Source: Ambulatory Visit | Attending: Family Medicine | Admitting: Family Medicine

## 2020-10-22 DIAGNOSIS — R0609 Other forms of dyspnea: Secondary | ICD-10-CM

## 2020-10-22 DIAGNOSIS — E669 Obesity, unspecified: Secondary | ICD-10-CM | POA: Diagnosis not present

## 2020-10-22 DIAGNOSIS — M79644 Pain in right finger(s): Secondary | ICD-10-CM | POA: Insufficient documentation

## 2020-10-22 DIAGNOSIS — R197 Diarrhea, unspecified: Secondary | ICD-10-CM | POA: Diagnosis not present

## 2020-10-22 DIAGNOSIS — M7989 Other specified soft tissue disorders: Secondary | ICD-10-CM | POA: Diagnosis not present

## 2020-10-22 DIAGNOSIS — R0600 Dyspnea, unspecified: Secondary | ICD-10-CM | POA: Diagnosis not present

## 2020-10-22 DIAGNOSIS — M359 Systemic involvement of connective tissue, unspecified: Secondary | ICD-10-CM | POA: Diagnosis not present

## 2020-10-30 ENCOUNTER — Other Ambulatory Visit: Payer: Self-pay

## 2020-10-30 ENCOUNTER — Encounter: Payer: Self-pay | Admitting: Cardiology

## 2020-10-30 ENCOUNTER — Ambulatory Visit (HOSPITAL_COMMUNITY)
Admission: RE | Admit: 2020-10-30 | Discharge: 2020-10-30 | Disposition: A | Discharge status: Home / Self Care | Payer: Federal, State, Local not specified - PPO | Source: Ambulatory Visit | Attending: Cardiology | Admitting: Cardiology

## 2020-10-30 ENCOUNTER — Ambulatory Visit: Payer: Federal, State, Local not specified - PPO | Admitting: Cardiology

## 2020-10-30 VITALS — BP 118/78 | HR 76 | Ht 69.0 in | Wt 276.0 lb

## 2020-10-30 DIAGNOSIS — R0609 Other forms of dyspnea: Secondary | ICD-10-CM

## 2020-10-30 DIAGNOSIS — I493 Ventricular premature depolarization: Secondary | ICD-10-CM | POA: Diagnosis not present

## 2020-10-30 DIAGNOSIS — R079 Chest pain, unspecified: Secondary | ICD-10-CM

## 2020-10-30 LAB — ECHOCARDIOGRAM COMPLETE
Area-P 1/2: 4.86 cm2
Height: 69 in
S' Lateral: 3.7 cm
Single Plane A4C EF: 55.4 %
Weight: 4416 oz

## 2020-10-30 NOTE — Progress Notes (Signed)
  Echocardiogram 2D Echocardiogram has been performed.  Matilde Bash 10/30/2020, 2:50 PM

## 2020-10-30 NOTE — Addendum Note (Signed)
Addended by: Carlyle Dolly F on: 10/30/2020 11:26 AM   Modules accepted: Level of Service

## 2020-10-30 NOTE — Progress Notes (Addendum)
Clinical Summary Ms. Bousquet is a 50 y.o.female seen today as a new patient  Previously seen by Dr Bronson Ing in 03/2017    1.Palpitations - previously evaluated by Dr Bronson Ing in 2019 - event monitor showed very rare PVCs, no significant arrhytmias  2. DOE/Chest pain - started about 1. 5 weeks ago - SOB with activities. Walking just short distances casues DOE. - some recent chest pains. Left sided, sharp pain that comes on with SOB. 7-8/10 in severity. Can last 30 minutes or more. Not positional. Can some times be in different locations. + palpitations.  - no recent edema. Some recent cough, whitish sputum and chronic - significant chronic fatigue x 1 year     +ANA, +centromere She is on ozempic for weight loss.  Past Medical History:  Diagnosis Date   Abdominal pain    RLQ   Back pain    Chest pain, unspecified    Colitis    see psh for colonoscopy reports   Constipation    Dyspnea    Endometrial polyp 11/09/2012   Enlarged lymph node    right side - arm   Family history of ovarian cancer    MGM   Generalized headaches    GERD (gastroesophageal reflux disease)    Hemorrhoids    Hiatal hernia    Hip pain    IBS (irritable bowel syndrome)    IBS (irritable bowel syndrome)    Insomnia    Irregular bleeding 01/01/2013   Menorrhagia 10/17/2012   Migraines    Nausea    Nephrolithiasis    Obesity, unspecified    Palpitations    PVC (premature ventricular contraction)    Vitamin D deficiency      Allergies  Allergen Reactions   Zanaflex [Tizanidine Hcl] Hives   Morphine Other (See Comments)    Neck pain   Oxycodone-Acetaminophen Hives and Itching    Itching     Current Outpatient Medications  Medication Sig Dispense Refill   omeprazole (PRILOSEC) 20 MG capsule Take 1 capsule (20 mg total) by mouth 2 (two) times daily before a meal. Take 30 min before breakfast and 30 min before dinner 60 capsule 5   Semaglutide, 1 MG/DOSE, (OZEMPIC, 1 MG/DOSE,)  4 MG/3ML SOPN Inject 1 mg into the skin once a week.     No current facility-administered medications for this visit.     Past Surgical History:  Procedure Laterality Date   ABLATION     Uterine   BALLOON DILATION N/A 09/23/2020   Procedure: BALLOON DILATION;  Surgeon: Eloise Harman, DO;  Location: [facility];  Service: Endoscopy;  Laterality: N/A;   BIOPSY  09/23/2020   Procedure: BIOPSY;  Surgeon: Eloise Harman, DO;  Location: [facility];  Service: Endoscopy;;   COLONOSCOPY  2005   Dr. Irving Shows. Colitis of cecum and ICV, bx nonspecific   COLONOSCOPY N/A 02/10/2015   Minimal internal hemorrhoids, otherwise normal-appearing rectal mucosa. Normal-appearing colonic mucosa. Distal 10 cm of TI also appeared normal. Segmental biopsies taken benign.    colonoscopy with ileoscopy  2008   Dr. Laural Golden. patch of coarse mucosa at TI, bx  neg.  Random colon bx showed minimal active focal colitis ?resolving infection. Felt not c/w IBD.   COLONOSCOPY WITH PROPOFOL N/A 09/23/2020   Procedure: COLONOSCOPY WITH PROPOFOL;  Surgeon: Eloise Harman, DO;  Location: [facility];  Service: Endoscopy;  Laterality: N/A;  7:30am   DILITATION & CURRETTAGE/HYSTROSCOPY WITH THERMACHOICE ABLATION  N/A 12/01/2012   Procedure: DILATATION & CURETTAGE;REMOVAL OF ENDOMETRIAL POLYP; HYSTEROSCOPY WITH THERMACHOICE ENDOMETRIAL ABLATION;  Surgeon: Jonnie Kind, MD;  Location: [facility];  Service: Gynecology;  Laterality: N/A;   ESOPHAGOGASTRODUODENOSCOPY  02/2008   Dr. Gala Romney- birnak appearing tubular esophagus. small hiatal hernia, o/w normal stomach, D1, D2   ESOPHAGOGASTRODUODENOSCOPY (EGD) WITH PROPOFOL N/A 09/23/2020   Procedure: ESOPHAGOGASTRODUODENOSCOPY (EGD) WITH PROPOFOL;  Surgeon: Eloise Harman, DO;  [facility]: [facility];  Service: Endoscopy;  Laterality: N/A;   KIDNEY STONE SURGERY     KNEE ARTHROSCOPY  2008   ACL repair   LAPAROSCOPIC BILATERAL SALPINGECTOMY Bilateral 12/01/2012   Procedure:  LAPAROSCOPIC BILATERAL SALPINGECTOMY ;  Surgeon: Jonnie Kind, MD;  Location: [facility];  Service: Gynecology;  Laterality: Bilateral;   LESION REMOVAL Left 12/01/2012   Procedure: SKIN TAG REMOVAL (Left inner thigh) ;  Surgeon: Jonnie Kind, MD;  Location: [facility];  Service: Gynecology;  Laterality: Left;   POLYPECTOMY  09/23/2020   Procedure: POLYPECTOMY;  Surgeon: Eloise Harman, DO;  Location: [facility];  Service: Endoscopy;;     Allergies  Allergen Reactions   Zanaflex [Tizanidine Hcl] Hives   Morphine Other (See Comments)    Neck pain   Oxycodone-Acetaminophen Hives and Itching    Itching      Family History  Problem Relation Age of Onset   Scleroderma Mother    Sleep apnea Mother    Obesity Mother    Hypertension Father    Cancer Father        liver   Cirrhosis Father        non-alcoholic   Obesity Father    Other Sister        Transverse myelitis   Cancer Maternal Grandmother        ovarian cancer   Diabetes Maternal Grandfather    Stroke Paternal Grandmother    Hypertension Paternal Grandmother    Colon cancer Neg Hx    Colon polyps Neg Hx      Social History Ms. Friend reports that she has never smoked. She has never used smokeless tobacco. Ms. Cielo reports current alcohol use.   Review of Systems CONSTITUTIONAL: No weight loss, fever, chills, weakness or fatigue.  HEENT: Eyes: No visual loss, blurred vision, double vision or yellow sclerae.No hearing loss, sneezing, congestion, runny nose or sore throat.  SKIN: No rash or itching.  CARDIOVASCULAR: per hpi RESPIRATORY: No shortness of breath, cough or sputum.  GASTROINTESTINAL: No anorexia, nausea, vomiting or diarrhea. No abdominal pain or blood.  GENITOURINARY: No burning on urination, no polyuria NEUROLOGICAL: No headache, dizziness, syncope, paralysis, ataxia, numbness or tingling in the extremities. No change in bowel or bladder control.  MUSCULOSKELETAL: No muscle, back pain, joint pain  or stiffness.  LYMPHATICS: No enlarged nodes. No history of splenectomy.  PSYCHIATRIC: No history of depression or anxiety.  ENDOCRINOLOGIC: No reports of sweating, cold or heat intolerance. No polyuria or polydipsia.  Marland Kitchen   Physical Examination Today's Vitals   10/30/20 1039 10/30/20 1044  BP: 120/76 118/78  Pulse: 76   SpO2: 97%   Weight: 276 lb (125.2 kg)   Height: 5\' 9"  (1.753 m)    Body mass index is 40.76 kg/m.  Gen: resting comfortably, no acute distress HEENT: no scleral icterus, pupils equal round and reactive, no palptable cervical adenopathy,  CV: RRR, no mr/g no jvd Resp: Clear to auscultation bilaterally GI: abdomen is soft, non-tender, non-distended, normal bowel sounds, no hepatosplenomegaly MSK:  extremities are warm, no edema.  Skin: warm, no rash Neuro:  no focal deficits Psych: appropriate affect   Diagnostic Studies 02/2017 monitor Sinus rhythm with rare PVC's. No arrhythmias. Symptoms correlated predominantly with sinus rhythm.     Assessment and Plan  1.DOE/Chest pain - unclear etiology - will start workup with echo, pending results consider ischemic testing. Given body habitus I think coronary CTA would be most effective modality.  - with recent +ANA f/u echo results, if signs of pulmonary HTN may require further workup      Arnoldo Lenis, M.D.

## 2020-10-30 NOTE — Patient Instructions (Signed)
Medication Instructions:  Your physician recommends that you continue on your current medications as directed. Please refer to the Current Medication list given to you today.  *If you need a refill on your cardiac medications before your next appointment, please call your pharmacy*   Lab Work: None If you have labs (blood work) drawn today and your tests are completely normal, you will receive your results only by: [facility] (if you have MyChart) OR A paper copy in the mail If you have any lab test that is abnormal or we need to change your treatment, we will call you to review the results.   Testing/Procedures: Your physician has requested that you have an echocardiogram. Echocardiography is a painless test that uses sound waves to create images of your heart. It provides your doctor with information about the size and shape of your heart and how well your heart's chambers and valves are working. This procedure takes approximately one hour. There are no restrictions for this procedure.    Follow-Up: At [facility], you and your health needs are our priority.  As part of our continuing mission to provide you with exceptional heart care, we have created designated Provider Care Teams.  These Care Teams include your primary Cardiologist (physician) and Advanced Practice Providers (APPs -  Physician Assistants and Nurse Practitioners) who all work together to provide you with the care you need, when you need it.  We recommend signing up for the patient portal called "MyChart".  Sign up information is provided on this After Visit Summary.  MyChart is used to connect with patients for Virtual Visits (Telemedicine).  Patients are able to view lab/test results, encounter notes, upcoming appointments, etc.  Non-urgent messages can be sent to your provider as well.   To learn more about what you can do with MyChart, go to [email or web address].    Your next appointment:   6 week(s)  The  format for your next appointment:   In Person  Provider:   You may see Carlyle Dolly, MD or one of the following Advanced Practice Providers on your designated Care Team:   Bernerd Pho, PA-C  Ermalinda Barrios, [place]    Other Instructions

## 2020-10-31 ENCOUNTER — Telehealth: Payer: Self-pay

## 2020-10-31 DIAGNOSIS — R079 Chest pain, unspecified: Secondary | ICD-10-CM

## 2020-10-31 MED ORDER — METOPROLOL TARTRATE 100 MG PO TABS: ORAL_TABLET | ORAL | 0 refills | Status: DC

## 2020-10-31 NOTE — Telephone Encounter (Signed)
-----   Message from Arnoldo Lenis, MD sent at 10/31/2020  2:06 PM EDT ----- Normal echo. Can we order a coronary CT scan for chest pain in [place]. 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

## 2020-11-03 NOTE — Telephone Encounter (Signed)
Pt's CT scheduled for Friday 10/21. Pt is aware of appointment time and directions prior to CT.

## 2020-11-05 ENCOUNTER — Telehealth (HOSPITAL_COMMUNITY): Payer: Self-pay | Admitting: *Deleted

## 2020-11-05 NOTE — Telephone Encounter (Signed)
Reaching out to patient to offer assistance regarding upcoming cardiac imaging study; pt verbalizes understanding of appt date/time, parking situation and where to check in, pre-test NPO status and medications ordered; name and call back number provided for further questions should they arise  Gordy Clement RN Navigator Cardiac Imaging [facility] [phone] office [phone] cell  Patient to take 100mg  metoprolol tartrate two hours prior to cardiac CT scan.

## 2020-11-07 ENCOUNTER — Ambulatory Visit (HOSPITAL_COMMUNITY)
Admission: RE | Admit: 2020-11-07 | Discharge: 2020-11-07 | Disposition: A | Discharge status: Home / Self Care | Payer: Federal, State, Local not specified - PPO | Source: Ambulatory Visit | Attending: Cardiology | Admitting: Cardiology

## 2020-11-07 ENCOUNTER — Other Ambulatory Visit: Payer: Self-pay

## 2020-11-07 DIAGNOSIS — R079 Chest pain, unspecified: Secondary | ICD-10-CM | POA: Insufficient documentation

## 2020-11-07 MED ORDER — METOPROLOL TARTRATE 5 MG/5ML IV SOLN
INTRAVENOUS | Status: AC
  Administered 2020-11-07: 10 mg via INTRAVENOUS
  Filled 2020-11-07: qty 5

## 2020-11-07 MED ORDER — IOHEXOL 350 MG/ML SOLN
100.0000 mL | Freq: Once | INTRAVENOUS | Status: AC | PRN
  Administered 2020-11-07: 100 mL via INTRAVENOUS

## 2020-11-07 MED ORDER — NITROGLYCERIN 0.4 MG SL SUBL
SUBLINGUAL_TABLET | SUBLINGUAL | Status: AC
  Administered 2020-11-07: 0.8 mg via SUBLINGUAL
  Filled 2020-11-07: qty 2

## 2020-11-07 MED ORDER — METOPROLOL TARTRATE 5 MG/5ML IV SOLN
10.0000 mg | INTRAVENOUS | Status: DC | PRN
Start: 2020-11-07 — End: 2020-11-08

## 2020-11-07 MED ORDER — NITROGLYCERIN 0.4 MG SL SUBL: 0.8000 mg | SUBLINGUAL_TABLET | Freq: Once | SUBLINGUAL | Status: AC

## 2020-11-18 ENCOUNTER — Other Ambulatory Visit: Payer: Self-pay

## 2020-11-18 ENCOUNTER — Encounter: Payer: Self-pay | Admitting: Orthopaedic Surgery

## 2020-11-18 ENCOUNTER — Ambulatory Visit: Payer: Federal, State, Local not specified - PPO | Admitting: Orthopaedic Surgery

## 2020-11-18 DIAGNOSIS — M79644 Pain in right finger(s): Secondary | ICD-10-CM | POA: Diagnosis not present

## 2020-11-18 MED ORDER — NAPROXEN 500 MG PO TABS: 500.0000 mg | ORAL_TABLET | Freq: Two times a day (BID) | ORAL | 5 refills | Status: DC

## 2020-11-18 NOTE — Progress Notes (Signed)
My finger hurts.  She has had pain in the right long finger since the end of August or the first of September.  It is on the radial side of the long finger proximal phalanx.  She had X-rays done on 10-22-20 which were read as negative.  She has used a popup for her phone to hold it.  She held it in the area that hurts now.  She has stopped the popup but her pain continues although less.  She works as Engineer, building services at [employer].  I have reviewed her X-rays.  I see small radio-opaque area of the distal proximal phalanx on the radial side.  This corresponds to the area of her pain.  She has some slight swelling of the right dominant long finger just proximal to the PIP joint. ROM is full but tender here.  NV intact.  Encounter Diagnosis  Name Primary?   Pain of right middle finger Yes   I feel she has irritation here or early cyst.  It could be from the phone but that is unknown.  I will have her use Aspercreme with Lidocaine to the area, take naprosyn 500 po bid pc.  Return in two weeks.  Call if any problem.  Electronically [place], MD 11/1/20222:33 PM

## 2020-11-21 ENCOUNTER — Encounter: Payer: Self-pay | Admitting: Gastroenterology

## 2020-11-21 ENCOUNTER — Ambulatory Visit: Payer: Federal, State, Local not specified - PPO | Admitting: Gastroenterology

## 2020-11-21 ENCOUNTER — Encounter: Payer: Self-pay | Admitting: Internal Medicine

## 2020-11-21 NOTE — Progress Notes (Deleted)
Primary Care Physician: Celene Squibb, MD  Primary Gastroenterologist:  Garfield Cornea, MD   No chief complaint on file.   HPI: Meghan [name] is a 50 y.o. female here for follow-up.  She has a history of chronic GERD, IBS, dysphagia.  Patient last seen in the office back in May.  Complaining of bilateral lower abdominal pain waking her up at night and happening throughout the day unrelated to meals or bowel function.  Complained of joint pain.  Fatigue.  Night sweats.  Complained of change from constipation to frequent loose stools.  Labs from May 2022 with unremarkable sed rate, CRP, total CK and CK-MB.  B12 level low normal, celiac screen negative, LFTs normal, renal function normal, CBC normal.  ANA was positive with positive centromere antibody.  CT abdomen and pelvis with contrast June 2022 showed nonobstructing right lower pole kidney stone measuring 22 mm, leiomyomatous uterus.  She saw cardiology and had a normal coronary CT scan and echo in October 2022.   EGD September 2022: -Small hiatal hernia. - Gastritis. Biopsied.  Unremarkable biopsies with negative H. pylori - Normal duodenal bulb, first portion of the duodenum and second portion of the duodenum.  Colonoscopy September 2022: -Non-bleeding internal hemorrhoids. - One 2 mm polyp in the transverse colon, removed with a cold biopsy forceps. Resected and retrieved.  Tubular adenoma. - The examined portion of the ileum was normal. - Biopsies were taken with a cold forceps from the ascending colon, transverse colon and descending colon for evaluation of microscopic colitis.  Random colon biopsies negative.  -Next colonoscopy in 5 years   Current Outpatient Medications  Medication Sig Dispense Refill   HYDROcodone-acetaminophen (NORCO/VICODIN) 5-325 MG tablet hydrocodone 5 mg-acetaminophen 325 mg tablet     metoprolol tartrate (LOPRESSOR) 100 MG tablet Take one tablet two hours prior to Cardiac CT. 1 tablet  0   naproxen (NAPROSYN) 500 MG tablet Take 1 tablet (500 mg total) by mouth 2 (two) times daily with a meal. 60 tablet 5   omeprazole (PRILOSEC) 20 MG capsule Take 1 capsule (20 mg total) by mouth 2 (two) times daily before a meal. Take 30 min before breakfast and 30 min before dinner 60 capsule 5   Semaglutide, 1 MG/DOSE, (OZEMPIC, 1 MG/DOSE,) 4 MG/3ML SOPN Inject 1 mg into the skin once a week.     No current facility-administered medications for this visit.    Allergies as of 11/21/2020 - Review Complete 11/18/2020  Allergen Reaction Noted   Zanaflex [tizanidine hcl] Hives 01/07/2017   Morphine Other (See Comments) 08/01/2013   Oxycodone-acetaminophen Hives and Itching 12/01/2012    ROS:  General: Negative for anorexia, weight loss, fever, chills, fatigue, weakness. ENT: Negative for hoarseness, difficulty swallowing , nasal congestion. CV: Negative for chest pain, angina, palpitations, dyspnea on exertion, peripheral edema.  Respiratory: Negative for dyspnea at rest, dyspnea on exertion, cough, sputum, wheezing.  GI: See history of present illness. GU:  Negative for dysuria, hematuria, urinary incontinence, urinary frequency, nocturnal urination.  Endo: Negative for unusual weight change.    Physical Examination:   There were no vitals taken for this visit.  General: Well-nourished, well-developed in no acute distress.  Eyes: No icterus. Mouth: Oropharyngeal mucosa moist and pink , no lesions erythema or exudate. Lungs: Clear to auscultation bilaterally.  Heart: Regular rate and rhythm, no murmurs rubs or gallops.  Abdomen: Bowel sounds are normal, nontender, nondistended, no hepatosplenomegaly or masses, no abdominal bruits or hernia ,  no rebound or guarding.   Extremities: No lower extremity edema. No clubbing or deformities. Neuro: Alert and oriented x 4   Skin: Warm and dry, no jaundice.   Psych: Alert and cooperative, normal mood and affect.  Labs:  ***  Imaging  Studies: DG Chest 2 View  Result Date: 10/23/2020 CLINICAL DATA:  Dyspnea with exertion. EXAM: CHEST - 2 VIEW COMPARISON:  February 19, 2019. FINDINGS: The heart size and mediastinal contours are within normal limits. Both lungs are clear. The visualized skeletal structures are unremarkable. IMPRESSION: No active cardiopulmonary disease. Electronically Signed   By: Marijo Conception M.D.   On: 10/23/2020 11:19   CT CORONARY MORPH W/CTA COR W/SCORE W/CA W/CM &/OR WO/CM  Addendum Date: 11/07/2020   ADDENDUM REPORT: 11/07/2020 18:36 CLINICAL DATA:  50 Year old White Female EXAM: Cardiac/Coronary  CTA TECHNIQUE: The patient was scanned on a [employer]. FINDINGS: Scan was triggered in the descending thoracic aorta. Axial non-contrast 3 mm slices were carried out through the heart. The data set was analyzed on a dedicated work station and scored using the [place]red. Gantry rotation speed was 250 msecs and collimation was .6 mm. 0.8 mg of sl NTG was given. The 3D data set was reconstructed in 5% intervals of the 67-82 % of the R-R cycle. Diastolic phases were analyzed on a dedicated work station using MPR, MIP and VRT modes. The patient received 100 cc of contrast, 110 kEV. Aorta:  Normal size.  No calcifications.  No dissection. Main Pulmonary Artery: Mild dilation of the main pulmonary artery 30 mm. Aortic Valve:  Tri-leaflet.  No calcifications. Coronary Arteries:  Normal coronary origin.  Right dominance. Coronary Calcium Score: Left main: 0 Left anterior descending artery: 0 Left circumflex artery: 0 Right coronary artery: 0 Total: 0 Percentile: 1st for age, sex, and race matched control. RCA is a large dominant artery that gives rise to PDA and PLA. There is no significant plaque. Left main is a large artery that gives rise to LAD and LCX arteries. There is no significant plaque. LAD is a large vessel that gives rise to one large D1 Branch. There is no significant plaque. LCX is a non-dominant  artery that gives rise to one large OM1 branch. There is no significant plaque. Other findings: Normal pulmonary vein drainage into the left atrium. Normal left atrial appendage without a thrombus. Attenuation artifact noted. Extra-cardiac findings: See attached radiology report for non-cardiac structures. IMPRESSION: 1. Coronary calcium score of 0. This was 1st percentile for age, sex, and race matched control. 2. Normal coronary origin with right dominance. 3. CAD-RADS 0. No evidence of CAD (0%). Consider non-atherosclerotic causes of chest pain. 4. Mild dilation of the main pulmonary artery 30 mm. RECOMMENDATIONS: Coronary artery calcium (CAC) score is a strong predictor of incident coronary heart disease (CHD) and provides predictive information beyond traditional risk factors. CAC scoring is reasonable to use in the decision to withhold, postpone, or initiate statin therapy in intermediate-risk or selected borderline-risk asymptomatic adults (age 44-75 years and LDL-C >=70 to <190 mg/dL) who do not have diabetes or established atherosclerotic cardiovascular disease (ASCVD).* In intermediate-risk (10-year ASCVD risk >=7.5% to <20%) adults or selected borderline-risk (10-year ASCVD risk >=5% to <7.5%) adults in whom a CAC score is measured for the purpose of making a treatment decision the following recommendations have been made: If CAC = 0, it is reasonable to withhold statin therapy and reassess in 5 to 10 years, as long as higher  risk conditions are absent (diabetes mellitus, family history of premature CHD in first degree relatives (males <55 years; females <65 years), cigarette smoking, LDL >=190 mg/dL or other independent risk factors). If CAC is 1 to 99, it is reasonable to initiate statin therapy for patients >=67 years of age. If CAC is >=100 or >=75th percentile, it is reasonable to initiate statin therapy at any age. Cardiology referral should be considered for patients with CAC scores =400 or >=75th  percentile. *2018 AHA/ACC/AACVPR/AAPA/ABC/ACPM/ADA/AGS/APhA/ASPC/NLA/PCNA Guideline on the Management of Blood Cholesterol: A Report of the American College of Cardiology/American Heart Association Task Force on Clinical Practice Guidelines. J Am Coll Cardiol. 2019;73(24):3168-3209. Rudean Haskell, MD Electronically Signed   By: Rudean Haskell M.D.   On: 11/07/2020 18:36   Result Date: 11/07/2020 EXAM: OVER-READ INTERPRETATION  CT CHEST The following report is an over-read performed by radiologist Dr. Zetta Bills of [facility], [place] on 11/07/2020. This over-read does not include interpretation of cardiac or coronary anatomy or pathology. The coronary calcium score/coronary CTA interpretation by the cardiologist is attached. COMPARISON:  CT chest of March 08, 2008. FINDINGS: Vascular: See dedicated report regarding cardiovascular findings. Mediastinum/Nodes: Patulous distal esophagus. No substantial thickening. Limited imaging of the chest includes cardiac structures and mid chest only without visible adenopathy within this portion of the chest. Lungs/Pleura: Elevated LEFT hemidiaphragm slightly more elevated than on normal more remote imaging. Juxta diaphragmatic basilar atelectatic changes worse on the LEFT. Airways are patent. Upper Abdomen: Hepatic steatosis. Elevated LEFT hemidiaphragm as above. No acute upper abdominal findings. Musculoskeletal: No acute or destructive bone process. IMPRESSION: Hepatic steatosis. Elevated LEFT hemidiaphragm with associated compressive atelectasis.w diaphragmatic elevation is worse since 20/10 but similar to July of 2022. Mild to moderate elevation is present. See dedicated report regarding cardiovascular findings, reported separately. Electronically Signed: By: Zetta Bills M.D. On: 11/07/2020 17:04   DG Finger Middle Right  Result Date: 10/23/2020 CLINICAL DATA:  Right middle finger swelling and burning pain. EXAM: RIGHT MIDDLE FINGER 2+V  COMPARISON:  None. FINDINGS: There is no evidence of fracture or dislocation. There is no evidence of arthropathy or other focal bone abnormality. Soft tissues are unremarkable. IMPRESSION: Negative. Electronically Signed   By: Titus Dubin M.D.   On: 10/23/2020 15:23   ECHOCARDIOGRAM COMPLETE  Result Date: 10/30/2020    ECHOCARDIOGRAM REPORT   Patient Name:   Meghan [name] Date of Exam: 10/30/2020 Medical Rec #:  [id number]         Height:       69.0 in Accession #:    [id number]        Weight:       276.0 lb Date of Birth:  27-Feb-1970        BSA:          2.369 m Patient Age:    50 years          BP:           118/78 mmHg Patient Gender: F                 HR:           76 bpm. Exam Location:  [facility] Procedure: 2D Echo, Cardiac Doppler and Color Doppler Indications:    Chest pain  History:        Patient has no prior history of Echocardiogram examinations.                 Arrythmias:palpitations, Signs/Symptoms:Chest Pain and Dyspnea;  Risk Factors:Obesity.  Sonographer:    Dustin Flock RDCS Referring Phys: [id number] [place]  1. Left ventricular ejection fraction, by estimation, is 55 to 60%. The left ventricle has normal function. The left ventricle has no regional wall motion abnormalities. There is mild left ventricular hypertrophy. Left ventricular diastolic parameters were normal.  2. Right ventricular systolic function is normal. The right ventricular size is normal. There is normal pulmonary artery systolic pressure. The estimated right ventricular systolic pressure is 47.4 mmHg.  3. The mitral valve is grossly normal. Trivial mitral valve regurgitation.  4. The aortic valve is tricuspid. Aortic valve regurgitation is not visualized.  5. The inferior vena cava is normal in size with greater than 50% respiratory variability, suggesting right atrial pressure of 3 mmHg. Comparison(s): No prior Echocardiogram. Conclusion(s)/Recommendation(s): Normal  biventricular function without evidence of hemodynamically significant valvular heart disease. FINDINGS  Left Ventricle: Left ventricular ejection fraction, by estimation, is 55 to 60%. The left ventricle has normal function. The left ventricle has no regional wall motion abnormalities. The left ventricular internal cavity size was normal in size. There is  mild left ventricular hypertrophy. Left ventricular diastolic parameters were normal. Right Ventricle: The right ventricular size is normal. No increase in right ventricular wall thickness. Right ventricular systolic function is normal. There is normal pulmonary artery systolic pressure. The tricuspid regurgitant velocity is 2.21 m/s, and  with an assumed right atrial pressure of 3 mmHg, the estimated right ventricular systolic pressure is 25.9 mmHg. Left Atrium: Left atrial size was normal in size. Right Atrium: Right atrial size was normal in size. Pericardium: There is no evidence of pericardial effusion. Mitral Valve: The mitral valve is grossly normal. Trivial mitral valve regurgitation. Tricuspid Valve: The tricuspid valve is grossly normal. Tricuspid valve regurgitation is trivial. Aortic Valve: The aortic valve is tricuspid. Aortic valve regurgitation is not visualized. Pulmonic Valve: The pulmonic valve was normal in structure. Pulmonic valve regurgitation is not visualized. Aorta: The aortic root and ascending aorta are structurally normal, with no evidence of dilitation. Venous: The inferior vena cava is normal in size with greater than 50% respiratory variability, suggesting right atrial pressure of 3 mmHg. IAS/Shunts: No atrial level shunt detected by color flow Doppler.  LEFT VENTRICLE PLAX 2D LVIDd:         5.10 cm     Diastology LVIDs:         3.70 cm     LV e' medial:    12.10 cm/s LV PW:         1.10 cm     LV E/e' medial:  8.0 LV IVS:        1.10 cm     LV e' lateral:   11.60 cm/s LVOT diam:     2.50 cm     LV E/e' lateral: 8.3 LV SV:          115 LV SV Index:   48 LVOT Area:     4.91 cm  LV Volumes (MOD) LV vol d, MOD A4C: 99.1 ml LV vol s, MOD A4C: 44.2 ml LV SV MOD A4C:     99.1 ml RIGHT VENTRICLE RV Basal diam:  2.70 cm RV S prime:     12.10 cm/s TAPSE (M-mode): 2.1 cm LEFT ATRIUM             Index        RIGHT ATRIUM           Index LA diam:  4.30 cm 1.82 cm/m   RA Area:     12.40 cm LA Vol (A2C):   66.4 ml 28.03 ml/m  RA Volume:   29.60 ml  12.49 ml/m LA Vol (A4C):   30.2 ml 12.75 ml/m LA Biplane Vol: 44.8 ml 18.91 ml/m  AORTIC VALVE LVOT Vmax:   99.20 cm/s LVOT Vmean:  72.300 cm/s LVOT VTI:    0.234 m  AORTA Ao Root diam: 2.80 cm MITRAL VALVE               TRICUSPID VALVE MV Area (PHT): 4.86 cm    TR Peak grad:   19.5 mmHg MV Decel Time: 156 msec    TR Vmax:        221.00 cm/s MV E velocity: 96.40 cm/s MV A velocity: 55.30 cm/s  SHUNTS MV E/A ratio:  1.74        Systemic VTI:  0.23 m                            Systemic Diam: 2.50 cm Lyman Bishop MD Electronically signed by Lyman Bishop MD Signature Date/Time: 10/30/2020/8:06:37 PM    Final      Assessment:     Plan:

## 2020-12-17 ENCOUNTER — Ambulatory Visit: Payer: Federal, State, Local not specified - PPO | Admitting: Student

## 2020-12-30 ENCOUNTER — Ambulatory Visit: Payer: Federal, State, Local not specified - PPO | Admitting: Orthopaedic Surgery

## 2020-12-30 DIAGNOSIS — J019 Acute sinusitis, unspecified: Secondary | ICD-10-CM | POA: Diagnosis not present

## 2021-01-27 ENCOUNTER — Encounter: Payer: Self-pay | Admitting: Orthopaedic Surgery

## 2021-01-27 ENCOUNTER — Ambulatory Visit: Payer: Federal, State, Local not specified - PPO | Admitting: Orthopaedic Surgery

## 2021-01-27 ENCOUNTER — Other Ambulatory Visit: Payer: Self-pay

## 2021-01-27 ENCOUNTER — Ambulatory Visit: Payer: Federal, State, Local not specified - PPO

## 2021-01-27 VITALS — BP 131/90 | HR 81 | Ht 69.0 in | Wt 276.0 lb

## 2021-01-27 DIAGNOSIS — M25559 Pain in unspecified hip: Secondary | ICD-10-CM

## 2021-01-27 DIAGNOSIS — M79605 Pain in left leg: Secondary | ICD-10-CM | POA: Diagnosis not present

## 2021-01-27 DIAGNOSIS — M545 Low back pain, unspecified: Secondary | ICD-10-CM

## 2021-01-27 DIAGNOSIS — M25552 Pain in left hip: Secondary | ICD-10-CM | POA: Diagnosis not present

## 2021-01-27 MED ORDER — PREDNISONE 5 MG (21) PO TBPK: ORAL_TABLET | ORAL | 0 refills | Status: DC

## 2021-01-27 NOTE — Progress Notes (Signed)
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 She has had more pain in the left hip, left thigh going to the left knee.  She has some lower back pain.  She has no trauma but is very active in her duties.  She has no numbness but the pain is limiting what she can do.  She gets some relief with ice but only temporary.  The rubs do not help.  NSAIDs do not help.  Spine/Pelvis examination:  Inspection:  Overall, sacoiliac joint benign and hips nontender; without crepitus or defects.   Thoracic spine inspection: Alignment normal without kyphosis present   Lumbar spine inspection:  Alignment  with normal lumbar lordosis, without scoliosis apparent.   Thoracic spine palpation:  without tenderness of spinal processes   Lumbar spine palpation: without tenderness of lumbar area; without tightness of lumbar muscles    Range of Motion:   Lumbar flexion, forward flexion is normal without pain or tenderness    Lumbar extension is full without pain or tenderness   Left lateral bend is normal without pain or tenderness   Right lateral bend is normal without pain or tenderness   Straight leg raising is normal  Strength & tone: normal   Stability overall normal stability  Although the exam is basically negative today, I am concerned by her history of pain that goes to the knee.  I would like to get a MRI of the lumbar spine.  X-rays were done of the hips and lumbar spine, reported separately.  Encounter Diagnoses  Name Primary?   Hip pain Yes   Lumbar pain with radiation down left leg    Get the MRI.  I will call in prednisone dose pack.  Return in one week.  Call if any problem.  Precautions discussed.  Electronically Signed Sanjuana Kava, MD 1/10/20233:10 PM

## 2021-01-28 ENCOUNTER — Ambulatory Visit (HOSPITAL_COMMUNITY)
Admission: RE | Admit: 2021-01-28 | Discharge: 2021-01-28 | Disposition: A | Discharge status: Home / Self Care | Payer: Federal, State, Local not specified - PPO | Source: Ambulatory Visit | Attending: Orthopaedic Surgery | Admitting: Orthopaedic Surgery

## 2021-01-28 DIAGNOSIS — M79605 Pain in left leg: Secondary | ICD-10-CM | POA: Diagnosis not present

## 2021-01-28 DIAGNOSIS — M5126 Other intervertebral disc displacement, lumbar region: Secondary | ICD-10-CM | POA: Diagnosis not present

## 2021-01-28 DIAGNOSIS — M545 Low back pain, unspecified: Secondary | ICD-10-CM | POA: Insufficient documentation

## 2021-01-28 DIAGNOSIS — M25559 Pain in unspecified hip: Secondary | ICD-10-CM | POA: Insufficient documentation

## 2021-01-28 DIAGNOSIS — M5136 Other intervertebral disc degeneration, lumbar region: Secondary | ICD-10-CM | POA: Diagnosis not present

## 2021-01-28 DIAGNOSIS — M4316 Spondylolisthesis, lumbar region: Secondary | ICD-10-CM | POA: Diagnosis not present

## 2021-01-30 ENCOUNTER — Encounter: Payer: Self-pay | Admitting: Radiology

## 2021-02-02 ENCOUNTER — Other Ambulatory Visit: Payer: Self-pay | Admitting: Orthopaedic Surgery

## 2021-02-02 DIAGNOSIS — M79605 Pain in left leg: Secondary | ICD-10-CM

## 2021-02-02 DIAGNOSIS — M545 Low back pain, unspecified: Secondary | ICD-10-CM

## 2021-02-02 NOTE — Progress Notes (Signed)
Order for NSU placed per Dr Luna Glasgow.

## 2021-02-03 ENCOUNTER — Ambulatory Visit: Payer: Federal, State, Local not specified - PPO | Admitting: Orthopaedic Surgery

## 2021-02-09 NOTE — Telephone Encounter (Signed)
See note from patient, please advise on prednisone Rx per her request?  Thanks.

## 2021-02-10 ENCOUNTER — Other Ambulatory Visit: Payer: Self-pay | Admitting: Orthopaedic Surgery

## 2021-02-10 MED ORDER — PREDNISONE 5 MG (21) PO TBPK: ORAL_TABLET | ORAL | 0 refills | Status: DC

## 2021-02-24 DIAGNOSIS — M5137 Other intervertebral disc degeneration, lumbosacral region: Secondary | ICD-10-CM | POA: Diagnosis not present

## 2021-02-24 DIAGNOSIS — R030 Elevated blood-pressure reading, without diagnosis of hypertension: Secondary | ICD-10-CM | POA: Diagnosis not present

## 2021-02-24 DIAGNOSIS — M25562 Pain in left knee: Secondary | ICD-10-CM | POA: Diagnosis not present

## 2021-02-24 DIAGNOSIS — Z6841 Body Mass Index (BMI) 40.0 and over, adult: Secondary | ICD-10-CM | POA: Diagnosis not present

## 2021-03-03 ENCOUNTER — Other Ambulatory Visit: Payer: Self-pay | Admitting: Pain Medicine

## 2021-03-03 DIAGNOSIS — M25562 Pain in left knee: Secondary | ICD-10-CM

## 2021-03-06 ENCOUNTER — Other Ambulatory Visit: Payer: Self-pay

## 2021-03-06 ENCOUNTER — Ambulatory Visit (HOSPITAL_COMMUNITY)
Admission: RE | Admit: 2021-03-06 | Discharge: 2021-03-06 | Disposition: A | Discharge status: Home / Self Care | Payer: Federal, State, Local not specified - PPO | Source: Ambulatory Visit | Attending: Pain Medicine | Admitting: Pain Medicine

## 2021-03-06 DIAGNOSIS — M25562 Pain in left knee: Secondary | ICD-10-CM | POA: Insufficient documentation

## 2021-03-27 DIAGNOSIS — M25561 Pain in right knee: Secondary | ICD-10-CM | POA: Diagnosis not present

## 2021-03-27 DIAGNOSIS — M25552 Pain in left hip: Secondary | ICD-10-CM | POA: Diagnosis not present

## 2021-03-31 DIAGNOSIS — M25561 Pain in right knee: Secondary | ICD-10-CM | POA: Diagnosis not present

## 2021-03-31 DIAGNOSIS — M25552 Pain in left hip: Secondary | ICD-10-CM | POA: Diagnosis not present

## 2021-04-01 DIAGNOSIS — M79604 Pain in right leg: Secondary | ICD-10-CM | POA: Diagnosis not present

## 2021-04-01 DIAGNOSIS — M79605 Pain in left leg: Secondary | ICD-10-CM | POA: Diagnosis not present

## 2021-04-01 DIAGNOSIS — S83241A Other tear of medial meniscus, current injury, right knee, initial encounter: Secondary | ICD-10-CM | POA: Diagnosis not present

## 2021-04-01 DIAGNOSIS — M25561 Pain in right knee: Secondary | ICD-10-CM | POA: Diagnosis not present

## 2021-04-02 ENCOUNTER — Ambulatory Visit (HOSPITAL_COMMUNITY)
Admission: RE | Admit: 2021-04-02 | Discharge: 2021-04-02 | Disposition: A | Discharge status: Home / Self Care | Payer: Federal, State, Local not specified - PPO | Source: Ambulatory Visit | Attending: Specialist | Admitting: Specialist

## 2021-04-02 ENCOUNTER — Other Ambulatory Visit: Payer: Self-pay | Admitting: Specialist

## 2021-04-02 ENCOUNTER — Other Ambulatory Visit: Payer: Self-pay

## 2021-04-02 ENCOUNTER — Other Ambulatory Visit (HOSPITAL_COMMUNITY): Payer: Self-pay | Admitting: Specialist

## 2021-04-02 DIAGNOSIS — M79661 Pain in right lower leg: Secondary | ICD-10-CM | POA: Diagnosis not present

## 2021-04-08 DIAGNOSIS — M25552 Pain in left hip: Secondary | ICD-10-CM | POA: Diagnosis not present

## 2021-04-09 DIAGNOSIS — M94261 Chondromalacia, right knee: Secondary | ICD-10-CM | POA: Diagnosis not present

## 2021-04-09 DIAGNOSIS — G8918 Other acute postprocedural pain: Secondary | ICD-10-CM | POA: Diagnosis not present

## 2021-04-09 DIAGNOSIS — Y999 Unspecified external cause status: Secondary | ICD-10-CM | POA: Diagnosis not present

## 2021-04-09 DIAGNOSIS — X58XXXA Exposure to other specified factors, initial encounter: Secondary | ICD-10-CM | POA: Diagnosis not present

## 2021-04-09 DIAGNOSIS — S83231A Complex tear of medial meniscus, current injury, right knee, initial encounter: Secondary | ICD-10-CM | POA: Diagnosis not present

## 2021-04-09 DIAGNOSIS — M25761 Osteophyte, right knee: Secondary | ICD-10-CM | POA: Diagnosis not present

## 2021-04-20 ENCOUNTER — Ambulatory Visit (HOSPITAL_COMMUNITY): Payer: Federal, State, Local not specified - PPO | Attending: Specialist | Admitting: Physical Therapy

## 2021-04-20 ENCOUNTER — Encounter (HOSPITAL_COMMUNITY): Payer: Self-pay | Admitting: Physical Therapy

## 2021-04-20 DIAGNOSIS — R29898 Other symptoms and signs involving the musculoskeletal system: Secondary | ICD-10-CM | POA: Insufficient documentation

## 2021-04-20 DIAGNOSIS — R262 Difficulty in walking, not elsewhere classified: Secondary | ICD-10-CM | POA: Insufficient documentation

## 2021-04-20 NOTE — Therapy (Signed)
?OUTPATIENT PHYSICAL THERAPY LOWER EXTREMITY EVALUATION ? ? ?Patient Name: Meghan [name] ?MRN: [id number] ?DOB:04-15-1970, 51 y.o., female ?Today's Date: 04/20/2021 ? ? PT End of Session - 04/20/21 1029   ? ? Visit Number 1   ? Number of Visits 8   ? Date for PT Re-Evaluation 06/15/21   ? Authorization Type BCBS/Federal EMP PPO(15 visits between PT/OT/ST)   ? Authorization Time Period 01/25/2004 - Current   ? PT Start Time [phone]   ? PT Stop Time 1026   ? PT Time Calculation (min) 38 min   ? Activity Tolerance Patient tolerated treatment well   ? Behavior During Therapy [facility] for tasks assessed/performed   ? ?  ?  ? ?  ? ? ?Past Medical History:  ?Diagnosis Date  ? Abdominal pain   ? RLQ  ? Back pain   ? Chest pain, unspecified   ? Colitis   ? see psh for colonoscopy reports  ? Constipation   ? Dyspnea   ? Endometrial polyp 11/09/2012  ? Enlarged lymph node   ? right side - arm  ? Family history of ovarian cancer   ? MGM  ? Generalized headaches   ? GERD (gastroesophageal reflux disease)   ? Hemorrhoids   ? Hiatal hernia   ? Hip pain   ? IBS (irritable bowel syndrome)   ? IBS (irritable bowel syndrome)   ? Insomnia   ? Irregular bleeding 01/01/2013  ? Menorrhagia 10/17/2012  ? Migraines   ? Nausea   ? Nephrolithiasis   ? Obesity, unspecified   ? Palpitations   ? PVC (premature ventricular contraction)   ? Vitamin D deficiency   ? ?Past Surgical History:  ?Procedure Laterality Date  ? ABLATION    ? Uterine  ? BALLOON DILATION N/A 09/23/2020  ? Procedure: BALLOON DILATION;  Surgeon: Eloise Harman, DO;  [facility];  Service: Endoscopy;  Laterality: N/A;  ? BIOPSY  09/23/2020  ? Procedure: BIOPSY;  Surgeon: Eloise Harman, DO;  [facility];  Service: Endoscopy;;  ? COLONOSCOPY  2005  ? Dr. Irving Shows. Colitis of cecum and ICV, bx nonspecific  ? COLONOSCOPY N/A 02/10/2015  ? Minimal internal hemorrhoids, otherwise normal-appearing rectal mucosa. Normal-appearing colonic mucosa. Distal 10 cm  of TI also appeared normal. Segmental biopsies taken benign.   ? colonoscopy with ileoscopy  2008  ? Dr. Laural Golden. patch of coarse mucosa at TI, bx  neg.  Random colon bx showed minimal active focal colitis ?resolving infection. Felt not c/w IBD.  ? COLONOSCOPY WITH PROPOFOL N/A 09/23/2020  ? Nonbleeding internal hemorrhoids, 2 mm tubular adenoma removed from the transverse colon, random colon biopsies negative.  Next colonoscopy in 5 years.  ? DILITATION & CURRETTAGE/HYSTROSCOPY WITH THERMACHOICE ABLATION N/A 12/01/2012  ? Procedure: DILATATION & CURETTAGE;REMOVAL OF ENDOMETRIAL POLYP; HYSTEROSCOPY WITH THERMACHOICE ENDOMETRIAL ABLATION;  Surgeon: Jonnie Kind, MD;  [facility]: [facility];  Service: Gynecology;  Laterality: N/A;  ? ESOPHAGOGASTRODUODENOSCOPY  02/2008  ? Dr. Gala Romney- birnak appearing tubular esophagus. small hiatal hernia, o/w normal stomach, D1, D2  ? ESOPHAGOGASTRODUODENOSCOPY (EGD) WITH PROPOFOL N/A 09/23/2020  ? Small hiatal hernia, gastritis with unremarkable biopsies.  ? KIDNEY STONE SURGERY    ? KNEE ARTHROSCOPY  2008  ? ACL repair  ? LAPAROSCOPIC BILATERAL SALPINGECTOMY Bilateral 12/01/2012  ? Procedure: LAPAROSCOPIC BILATERAL SALPINGECTOMY ;  Surgeon: Jonnie Kind, MD;  [facility]: [facility];  Service: Gynecology;  Laterality: Bilateral;  ? LESION REMOVAL Left 12/01/2012  ?  Procedure: SKIN TAG REMOVAL (Left inner thigh) ;  Surgeon: Jonnie Kind, MD;  Location: [facility];  Service: Gynecology;  Laterality: Left;  ? POLYPECTOMY  09/23/2020  ? Procedure: POLYPECTOMY;  Surgeon: Eloise Harman, DO;  [facility];  Service: Endoscopy;;  ? ?Patient Active Problem List  ? Diagnosis Date Noted  ? Positive ANA (antinuclear antibody) 08/15/2020  ? Epigastric abdominal tenderness without rebound tenderness 06/13/2020  ? Lower abdominal pain 06/03/2020  ? Night sweats 06/03/2020  ? Dysphagia 06/03/2020  ? Muscle ache 06/03/2020  ? Blood in stool 09/06/2019  ? Other fatigue 09/06/2019  ? Irregular  menstrual cycle 03/09/2019  ? Pelvic pain 03/09/2019  ? Proteinuria 03/09/2019  ? Hematuria 03/09/2019  ? Changing skin lesion 02/10/2018  ? Seborrheic keratoses 02/10/2018  ? Encounter for counseling 01/07/2017  ? Irregular heart beat 01/07/2017  ? 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 Stress headaches 06/23/2015  ? Excessive daytime sleepiness 06/23/2015  ? Morbid obesity due to excess calories ([place]) 06/23/2015  ? Change in bowel habits   ? Heme + stool   ? Heme positive stool 12/20/2014  ? Bowel habit changes 12/20/2014  ? Dysesthesia of multiple sites 08/29/2013  ? Migraine without aura and without status migrainosus, not intractable 08/01/2013  ? Irregular bleeding 01/01/2013  ? Endometrial polyp 11/09/2012  ? Menorrhagia 10/17/2012  ? Upper abdominal pain 10/06/2011  ? GERD (gastroesophageal reflux disease) 08/21/2010  ? Abdominal pain 08/21/2010  ? Cervical lymphadenopathy 08/21/2010  ? Localized swelling, mass, or lump of upper extremity 08/21/2010  ? Low back pain radiating to both legs 08/19/2010  ? Neck pain, chronic 08/19/2010  ? Numbness and tingling in both hands 08/19/2010  ? Premature ventricular contractions 07/17/2008  ? PALPITATIONS 07/17/2008  ? DYSPNEA 07/17/2008  ? CHEST PAIN-UNSPECIFIED 07/17/2008  ? OBESITY 09/21/2007  ? COLITIS 09/21/2007  ? CONSTIPATION 09/21/2007  ? IBS 09/21/2007  ? NAUSEA 09/21/2007  ? Diarrhea 09/21/2007  ? Abdominal pain 09/21/2007  ? ? ?PCP: Celene Squibb, MD ? ?REFERRING PROVIDER: Sydnee Cabal, MD ? ?REFERRING DIAG: s/p Rt knee arthroscopy ? ?THERAPY DIAG:  ?Difficulty in walking, not elsewhere classified ? ?Other symptoms and signs involving the musculoskeletal system ? ?ONSET DATE: 04/09/21 ? ?SUBJECTIVE:  ? ?SUBJECTIVE STATEMENT: ?Patient reports that she started having pain in the Rt knee in early January. There is no injury mechanism that the patient can think of. MRI confirmed the presence of a meniscal tear and this has since been corrected surgically via Rt knee  scope which was performed on 04/09/21. Pain has been well controlled since surgery and her main issue has been the sensation of swelling in the anterior Rt knee. She was using crutches up until yesterday during weight bearing activities, but is no longer using them as of this morning. ? ?PERTINENT HISTORY: ?ACL repair of Rt knee 2007/2008 ? ?PAIN:  ?Are you having pain? Yes: NPRS scale: 3/10 ?Pain location: Medial Rt knee ?Pain description: Her[place] Ard ?Aggravating factors: walking for extended distances ?Relieving factors: Non weightbearing ? ?PRECAUTIONS: None ? ?WEIGHT BEARING RESTRICTIONS No ? ?FALLS:  ?Has patient fallen in last 6 months? No ? ?LIVING ENVIRONMENT: ?Lives with: lives with their family ?Lives in: House/apartment ?Stairs: Yes: External: 6 steps; can reach both ?Has following equipment at home: Crutches d/ced yesterday. ? ?OCCUPATION: [employer] supervisor. ? ?PLOF: Independent ? ?PATIENT GOALS Return to work without pain and avoid a Rt knee replacement for as long as possible. ? ? ?OBJECTIVE:  ? ?DIAGNOSTIC FINDINGS:  Patient reportedly had an MRI of the Rt knee prior to surgery, but this imaging report is not available within the system at the time of this evaluation. ? ?COGNITION: ? Overall cognitive status: Within functional limits for tasks assessed   ?  ?SENSATION: ?Denies sensation changes ? ?PALPATION: ?TTP over the incisions ? ?LE ROM: ? ?Active ROM Right ?04/20/2021 Left ?04/20/2021  ?Hip flexion    ?Hip extension    ?Hip abduction    ?Hip adduction    ?Hip internal rotation    ?Hip external rotation    ?Knee flexion 123(pain distal lateral quad) 130  ?Knee extension 1 2  ?Ankle dorsiflexion    ?Ankle plantarflexion    ?Ankle inversion    ?Ankle eversion    ? (Blank rows = not tested) ? ?LE MMT: ? ?MMT Right ?04/20/2021 Left ?04/20/2021  ?Hip flexion    ?Hip extension    ?Hip abduction    ?Hip adduction    ?Hip internal rotation    ?Hip external rotation    ?Knee flexion 4+ 5  ?Knee extension 4+ 5  ?Ankle  dorsiflexion    ?Ankle plantarflexion    ?Ankle inversion    ?Ankle eversion    ? (Blank rows = not tested) ? ?LOWER EXTREMITY SPECIAL TESTS:  ?Knee special tests: Anterior drawer test: negative and [place]

## 2021-04-22 ENCOUNTER — Encounter (HOSPITAL_COMMUNITY): Payer: Federal, State, Local not specified - PPO

## 2021-04-24 ENCOUNTER — Ambulatory Visit (HOSPITAL_COMMUNITY): Payer: Federal, State, Local not specified - PPO | Admitting: Physical Therapy

## 2021-04-24 ENCOUNTER — Encounter (HOSPITAL_COMMUNITY): Payer: Self-pay | Admitting: Physical Therapy

## 2021-04-24 DIAGNOSIS — R262 Difficulty in walking, not elsewhere classified: Secondary | ICD-10-CM

## 2021-04-24 DIAGNOSIS — R29898 Other symptoms and signs involving the musculoskeletal system: Secondary | ICD-10-CM

## 2021-04-24 NOTE — Therapy (Signed)
?OUTPATIENT PHYSICAL THERAPY TREATMENT NOTE ? ? ?Patient Name: Meghan [name] ?MRN: [id number] ?DOB:1970-04-19, 51 y.o., female ?Today's Date: 04/24/2021 ? ?PCP: Celene Squibb, MD ?REFERRING PROVIDER: Celene Squibb, MD ? ?END OF SESSION:  ? PT End of Session - 04/24/21 0950   ? ? Visit Number 2   ? Number of Visits 8   ? Date for PT Re-Evaluation 06/15/21   ? Authorization Type BCBS/Federal EMP PPO(15 visits between PT/OT/ST)   ? Authorization Time Period 01/25/2004 - Current   ? PT Start Time [phone]   ? PT Stop Time 1003   ? PT Time Calculation (min) 16 min   ? Activity Tolerance Patient tolerated treatment well   ? Behavior During Therapy [facility] for tasks assessed/performed   ? ?  ?  ? ?  ? ? ?Past Medical History:  ?Diagnosis Date  ? Abdominal pain   ? RLQ  ? Back pain   ? Chest pain, unspecified   ? Colitis   ? see psh for colonoscopy reports  ? Constipation   ? Dyspnea   ? Endometrial polyp 11/09/2012  ? Enlarged lymph node   ? right side - arm  ? Family history of ovarian cancer   ? MGM  ? Generalized headaches   ? GERD (gastroesophageal reflux disease)   ? Hemorrhoids   ? Hiatal hernia   ? Hip pain   ? IBS (irritable bowel syndrome)   ? IBS (irritable bowel syndrome)   ? Insomnia   ? Irregular bleeding 01/01/2013  ? Menorrhagia 10/17/2012  ? Migraines   ? Nausea   ? Nephrolithiasis   ? Obesity, unspecified   ? Palpitations   ? PVC (premature ventricular contraction)   ? Vitamin D deficiency   ? ?Past Surgical History:  ?Procedure Laterality Date  ? ABLATION    ? Uterine  ? BALLOON DILATION N/A 09/23/2020  ? Procedure: BALLOON DILATION;  Surgeon: Eloise Harman, DO;  [facility];  Service: Endoscopy;  Laterality: N/A;  ? BIOPSY  09/23/2020  ? Procedure: BIOPSY;  Surgeon: Eloise Harman, DO;  [facility];  Service: Endoscopy;;  ? COLONOSCOPY  2005  ? Dr. Irving Shows. Colitis of cecum and ICV, bx nonspecific  ? COLONOSCOPY N/A 02/10/2015  ? Minimal internal hemorrhoids, otherwise  normal-appearing rectal mucosa. Normal-appearing colonic mucosa. Distal 10 cm of TI also appeared normal. Segmental biopsies taken benign.   ? colonoscopy with ileoscopy  2008  ? Dr. Laural Golden. patch of coarse mucosa at TI, bx  neg.  Random colon bx showed minimal active focal colitis ?resolving infection. Felt not c/w IBD.  ? COLONOSCOPY WITH PROPOFOL N/A 09/23/2020  ? Nonbleeding internal hemorrhoids, 2 mm tubular adenoma removed from the transverse colon, random colon biopsies negative.  Next colonoscopy in 5 years.  ? DILITATION & CURRETTAGE/HYSTROSCOPY WITH THERMACHOICE ABLATION N/A 12/01/2012  ? Procedure: DILATATION & CURETTAGE;REMOVAL OF ENDOMETRIAL POLYP; HYSTEROSCOPY WITH THERMACHOICE ENDOMETRIAL ABLATION;  Surgeon: Jonnie Kind, MD;  [facility];  Service: Gynecology;  Laterality: N/A;  ? ESOPHAGOGASTRODUODENOSCOPY  02/2008  ? Dr. Gala Romney- birnak appearing tubular esophagus. small hiatal hernia, o/w normal stomach, D1, D2  ? ESOPHAGOGASTRODUODENOSCOPY (EGD) WITH PROPOFOL N/A 09/23/2020  ? Small hiatal hernia, gastritis with unremarkable biopsies.  ? KIDNEY STONE SURGERY    ? KNEE ARTHROSCOPY  2008  ? ACL repair  ? LAPAROSCOPIC BILATERAL SALPINGECTOMY Bilateral 12/01/2012  ? Procedure: LAPAROSCOPIC BILATERAL SALPINGECTOMY ;  Surgeon: Jonnie Kind, MD;  [facility];  Service: Gynecology;  Laterality: Bilateral;  ? LESION REMOVAL Left 12/01/2012  ? Procedure: SKIN TAG REMOVAL (Left inner thigh) ;  Surgeon: Jonnie Kind, MD;  Location: [facility];  Service: Gynecology;  Laterality: Left;  ? POLYPECTOMY  09/23/2020  ? Procedure: POLYPECTOMY;  Surgeon: Eloise Harman, DO;  [facility];  Service: Endoscopy;;  ? ?Patient Active Problem List  ? Diagnosis Date Noted  ? Positive ANA (antinuclear antibody) 08/15/2020  ? Epigastric abdominal tenderness without rebound tenderness 06/13/2020  ? Lower abdominal pain 06/03/2020  ? Night sweats 06/03/2020  ? Dysphagia 06/03/2020  ? Muscle ache  06/03/2020  ? Blood in stool 09/06/2019  ? Other fatigue 09/06/2019  ? Irregular menstrual cycle 03/09/2019  ? Pelvic pain 03/09/2019  ? Proteinuria 03/09/2019  ? Hematuria 03/09/2019  ? Changing skin lesion 02/10/2018  ? Seborrheic keratoses 02/10/2018  ? Encounter for counseling 01/07/2017  ? Irregular heart beat 01/07/2017  ? 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 Stress headaches 06/23/2015  ? Excessive daytime sleepiness 06/23/2015  ? Morbid obesity due to excess calories ([place]) 06/23/2015  ? Change in bowel habits   ? Heme + stool   ? Heme positive stool 12/20/2014  ? Bowel habit changes 12/20/2014  ? Dysesthesia of multiple sites 08/29/2013  ? Migraine without aura and without status migrainosus, not intractable 08/01/2013  ? Irregular bleeding 01/01/2013  ? Endometrial polyp 11/09/2012  ? Menorrhagia 10/17/2012  ? Upper abdominal pain 10/06/2011  ? GERD (gastroesophageal reflux disease) 08/21/2010  ? Abdominal pain 08/21/2010  ? Cervical lymphadenopathy 08/21/2010  ? Localized swelling, mass, or lump of upper extremity 08/21/2010  ? Low back pain radiating to both legs 08/19/2010  ? Neck pain, chronic 08/19/2010  ? Numbness and tingling in both hands 08/19/2010  ? Premature ventricular contractions 07/17/2008  ? PALPITATIONS 07/17/2008  ? DYSPNEA 07/17/2008  ? CHEST PAIN-UNSPECIFIED 07/17/2008  ? OBESITY 09/21/2007  ? COLITIS 09/21/2007  ? CONSTIPATION 09/21/2007  ? IBS 09/21/2007  ? NAUSEA 09/21/2007  ? Diarrhea 09/21/2007  ? Abdominal pain 09/21/2007  ? ? ?REFERRING DIAG: s/p Rt knee arthroscopy ? ?THERAPY DIAG:  ?Difficulty in walking, not elsewhere classified ? ?Other symptoms and signs involving the musculoskeletal system ? ?PERTINENT HISTORY: ACL repair of Rt knee 2007/2008 ? ?PRECAUTIONS: None ? ?SUBJECTIVE: Patient reports that while walking through the grocery store, her Rt knee would have high amounts of drainage from the medial incision to the point it soaked through her jeans and ran down her leg. She  states that she has not been able to figure out a bandage setup with which she is able to contain the amount of drainage. She says that if she stays off of the Rt knee then there is little to no drainage, but the moment she is weight bearing it begins. ? ?PAIN:  ?Are you having pain? No ? ?OBJECTIVE:  ?  ?TODAY'S TREATMENT: ?   04/24/21: ?  Aerobic: ?  -Recumbent bike x4.11mn ?  Standing: ?  -Slow single leg march on blue foam 2x30s ?  ?GAIT: ?Distance walked: 813f?Assistive device utilized: None ?Level of assistance: Complete Independence ?Comments: decreased stance time through the RLE as well as decrease knee flexion during swing phase. ?  ?  ?PATIENT EDUCATION:  ?Education details: HEP, POC, dietary alterations for meniscus healing. ?Person educated: Patient ?Education method: Explanation, Demonstration, and Handouts ?Education comprehension: verbalized understanding ?  ?  ?HOME EXERCISE PROGRAM: ?Access Code: [email or web address] ?URL: [email or web address] ?Date: 04/20/2021 ?Prepared  by: Adalberto Cole ?  ?Exercises ?- Sitting Heel Slide with Towel  - 2-3 x daily - 7 x weekly - 6 reps - 15s hold ?- Ankle Pumps in Elevation  - 5-6 x daily - 7 x weekly - 16 reps ?- Seated Knee Extension AROM  - 2-3 x daily - 7 x weekly - 3 sets - 12 reps ?- Standing Knee Flexion  - 2-3 x daily - 7 x weekly - 3 sets - 10 reps ?- Ankle Inversion Eversion Towel Slide  - 2-3 x daily - 7 x weekly - 2 sets - 12 reps ?  ?ASSESSMENT: ?  ?CLINICAL IMPRESSION: ?Patient tolerated treatment well during today's session, but the session was halted due to an unusually high amount of drainage coming from the medial incision site. Based on the frequency and amount of drainage along with weight bearing activities increasing the amount of drainage, it may be possible that the drainage is synovial fluid. The patient was given 2 bandages and instructed to schedule an appointment with her referring physician as soon as possible. There was also a reduction in  temperature of the skin just distal to the medial incision site. Drainage during this session was serous and with no indication of blood. The patient did indicate by the end of the session that she was going to [employer]

## 2021-04-27 DIAGNOSIS — S83231D Complex tear of medial meniscus, current injury, right knee, subsequent encounter: Secondary | ICD-10-CM | POA: Diagnosis not present

## 2021-04-28 ENCOUNTER — Encounter (HOSPITAL_COMMUNITY): Payer: Federal, State, Local not specified - PPO

## 2021-04-30 ENCOUNTER — Encounter (HOSPITAL_COMMUNITY): Payer: Federal, State, Local not specified - PPO | Admitting: Physical Therapy

## 2021-05-04 ENCOUNTER — Encounter (HOSPITAL_COMMUNITY): Payer: Federal, State, Local not specified - PPO | Admitting: Physical Therapy

## 2021-05-06 ENCOUNTER — Encounter (HOSPITAL_COMMUNITY): Payer: Federal, State, Local not specified - PPO

## 2021-05-08 DIAGNOSIS — M1711 Unilateral primary osteoarthritis, right knee: Secondary | ICD-10-CM | POA: Diagnosis not present

## 2021-05-12 ENCOUNTER — Encounter (HOSPITAL_COMMUNITY): Payer: Federal, State, Local not specified - PPO

## 2021-05-14 ENCOUNTER — Encounter (HOSPITAL_COMMUNITY): Payer: Federal, State, Local not specified - PPO | Admitting: Physical Therapy

## 2021-05-14 DIAGNOSIS — M25462 Effusion, left knee: Secondary | ICD-10-CM | POA: Diagnosis not present

## 2021-05-15 ENCOUNTER — Other Ambulatory Visit (HOSPITAL_COMMUNITY)
Admission: RE | Admit: 2021-05-15 | Discharge: 2021-05-15 | Disposition: A | Discharge status: Home / Self Care | Payer: Federal, State, Local not specified - PPO | Source: Ambulatory Visit | Attending: Specialist | Admitting: Specialist

## 2021-05-15 DIAGNOSIS — Z4789 Encounter for other orthopedic aftercare: Secondary | ICD-10-CM | POA: Insufficient documentation

## 2021-05-15 LAB — CBC WITH DIFFERENTIAL/PLATELET
Abs Immature Granulocytes: 0.02 10*3/uL (ref 0.00–0.07)
Basophils Absolute: 0 10*3/uL (ref 0.0–0.1)
Basophils Relative: 1 %
Eosinophils Absolute: 0.1 10*3/uL (ref 0.0–0.5)
Eosinophils Relative: 1 %
HCT: 44.3 % (ref 36.0–46.0)
Hemoglobin: 14.4 g/dL (ref 12.0–15.0)
Immature Granulocytes: 0 %
Lymphocytes Relative: 23 %
Lymphs Abs: 1.6 10*3/uL (ref 0.7–4.0)
MCH: 28.3 pg (ref 26.0–34.0)
MCHC: 32.5 g/dL (ref 30.0–36.0)
MCV: 87.2 fL (ref 80.0–100.0)
Monocytes Absolute: 0.6 10*3/uL (ref 0.1–1.0)
Monocytes Relative: 9 %
Neutro Abs: 4.7 10*3/uL (ref 1.7–7.7)
Neutrophils Relative %: 66 %
Platelets: 288 10*3/uL (ref 150–400)
RBC: 5.08 MIL/uL (ref 3.87–5.11)
RDW: 13.2 % (ref 11.5–15.5)
WBC: 7 10*3/uL (ref 4.0–10.5)
nRBC: 0 % (ref 0.0–0.2)

## 2021-05-15 LAB — C-REACTIVE PROTEIN: CRP: 0.7 mg/dL (ref <1.0)

## 2021-05-15 LAB — SEDIMENTATION RATE: Sed Rate: 5 mm/hr (ref 0–22)

## 2021-06-19 ENCOUNTER — Ambulatory Visit: Payer: Federal, State, Local not specified - PPO | Admitting: Podiatry

## 2021-06-19 ENCOUNTER — Ambulatory Visit (INDEPENDENT_AMBULATORY_CARE_PROVIDER_SITE_OTHER): Payer: Federal, State, Local not specified - PPO

## 2021-06-19 DIAGNOSIS — M7731 Calcaneal spur, right foot: Secondary | ICD-10-CM | POA: Diagnosis not present

## 2021-06-19 DIAGNOSIS — M722 Plantar fascial fibromatosis: Secondary | ICD-10-CM

## 2021-06-19 DIAGNOSIS — M7661 Achilles tendinitis, right leg: Secondary | ICD-10-CM

## 2021-06-19 MED ORDER — METHYLPREDNISOLONE 4 MG PO TBPK: ORAL_TABLET | ORAL | 0 refills | Status: DC

## 2021-06-19 NOTE — Patient Instructions (Signed)

## 2021-06-21 NOTE — Progress Notes (Signed)
Subjective:   Patient ID: Meghan [name], female   DOB: 51 y.o.   MRN: [id number]   HPI 51 year old female presents the office today for concerns of pain in the back of her right heel as well as swelling which started in January 2023 without any injury.  She has been taking ibuprofen there is significant improvement.  She put IcyHot patches on during the day which does help.  Numbness or tingling.  She says it hurts when she dorsiflexes her ankle back of the heel.   Review of Systems  All other systems reviewed and are negative.  Past Medical History:  Diagnosis Date   Abdominal pain    RLQ   Back pain    Chest pain, unspecified    Colitis    see psh for colonoscopy reports   Constipation    Dyspnea    Endometrial polyp 11/09/2012   Enlarged lymph node    right side - arm   Family history of ovarian cancer    MGM   Generalized headaches    GERD (gastroesophageal reflux disease)    Hemorrhoids    Hiatal hernia    Hip pain    IBS (irritable bowel syndrome)    IBS (irritable bowel syndrome)    Insomnia    Irregular bleeding 01/01/2013   Menorrhagia 10/17/2012   Migraines    Nausea    Nephrolithiasis    Obesity, unspecified    Palpitations    PVC (premature ventricular contraction)    Vitamin D deficiency     Past Surgical History:  Procedure Laterality Date   ABLATION     Uterine   BALLOON DILATION N/A 09/23/2020   Procedure: BALLOON DILATION;  Surgeon: Eloise Harman, DO;  [facility];  Service: Endoscopy;  Laterality: N/A;   BIOPSY  09/23/2020   Procedure: BIOPSY;  Surgeon: Eloise Harman, DO;  [facility];  Service: Endoscopy;;   COLONOSCOPY  2005   Dr. Irving Shows. Colitis of cecum and ICV, bx nonspecific   COLONOSCOPY N/A 02/10/2015   Minimal internal hemorrhoids, otherwise normal-appearing rectal mucosa. Normal-appearing colonic mucosa. Distal 10 cm of TI also appeared normal. Segmental biopsies taken benign.    colonoscopy  with ileoscopy  2008   Dr. Laural Golden. patch of coarse mucosa at TI, bx  neg.  Random colon bx showed minimal active focal colitis ?resolving infection. Felt not c/w IBD.   COLONOSCOPY WITH PROPOFOL N/A 09/23/2020   Nonbleeding internal hemorrhoids, 2 mm tubular adenoma removed from the transverse colon, random colon biopsies negative.  Next colonoscopy in 5 years.   DILITATION & CURRETTAGE/HYSTROSCOPY WITH THERMACHOICE ABLATION N/A 12/01/2012   Procedure: DILATATION & CURETTAGE;REMOVAL OF ENDOMETRIAL POLYP; HYSTEROSCOPY WITH THERMACHOICE ENDOMETRIAL ABLATION;  Surgeon: Jonnie Kind, MD;  [facility]: [facility];  Service: Gynecology;  Laterality: N/A;   ESOPHAGOGASTRODUODENOSCOPY  02/2008   Dr. Gala Romney- birnak appearing tubular esophagus. small hiatal hernia, o/w normal stomach, D1, D2   ESOPHAGOGASTRODUODENOSCOPY (EGD) WITH PROPOFOL N/A 09/23/2020   Small hiatal hernia, gastritis with unremarkable biopsies.   KIDNEY STONE SURGERY     KNEE ARTHROSCOPY  2008   ACL repair   LAPAROSCOPIC BILATERAL SALPINGECTOMY Bilateral 12/01/2012   Procedure: LAPAROSCOPIC BILATERAL SALPINGECTOMY ;  Surgeon: Jonnie Kind, MD;  Location: [facility];  Service: Gynecology;  Laterality: Bilateral;   LESION REMOVAL Left 12/01/2012   Procedure: SKIN TAG REMOVAL (Left inner thigh) ;  Surgeon: Jonnie Kind, MD;  Location: [facility];  Service: Gynecology;  Laterality: Left;   POLYPECTOMY  09/23/2020   Procedure: POLYPECTOMY;  Surgeon: Eloise Harman, DO;  [facility];  Service: Endoscopy;;     Current Outpatient Medications:    methylPREDNISolone (MEDROL DOSEPAK) 4 MG TBPK tablet, Take as directed, Disp: 21 tablet, Rfl: 0   HYDROcodone-acetaminophen (NORCO/VICODIN) 5-325 MG tablet, hydrocodone 5 mg-acetaminophen 325 mg tablet, Disp: , Rfl:    metoprolol tartrate (LOPRESSOR) 100 MG tablet, Take one tablet two hours prior to Cardiac CT., Disp: 1 tablet, Rfl: 0   naproxen (NAPROSYN) 500 MG tablet, Take 1 tablet (500  mg total) by mouth 2 (two) times daily with a meal., Disp: 60 tablet, Rfl: 5   omeprazole (PRILOSEC) 20 MG capsule, Take 1 capsule (20 mg total) by mouth 2 (two) times daily before a meal. Take 30 min before breakfast and 30 min before dinner, Disp: 60 capsule, Rfl: 5   predniSONE (STERAPRED UNI-PAK 21 TAB) 5 MG (21) TBPK tablet, Take 6 pills first day; 5 pills second day; 4 pills third day; 3 pills fourth day; 2 pills next day and 1 pill last day., Disp: 21 tablet, Rfl: 0   Semaglutide, 1 MG/DOSE, (OZEMPIC, 1 MG/DOSE,) 4 MG/3ML SOPN, Inject 1 mg into the skin once a week., Disp: , Rfl:   Allergies  Allergen Reactions   Zanaflex [Tizanidine Hcl] Hives   Morphine Other (See Comments)    Neck pain   Oxycodone-Acetaminophen Hives and Itching    Itching         Objective:  Physical Exam  General: AAO x3, NAD  Dermatological: There are no open lesions.  Vascular: Dorsalis Pedis artery and Posterior Tibial artery pedal pulses are 2/4 bilateral with immedate capillary fill time.  There is no pain with calf compression, swelling, warmth, erythema.   Neruologic: Grossly intact via light touch bilateral.  No Tinel sign.  Musculoskeletal: There is tenderness palpation on posterior aspect of calcaneus on the insertion of the Achilles tendon.  Localized edema although mild present to the area.  No erythema or warmth.  Clinically the tendon appears to be intact.  There is no pain with lateral compression of calcaneus.  Muscular strength 5/5 in all groups tested bilateral.  Gait: Unassisted, Nonantalgic.       Assessment:   Insertional Achilles tendinitis     Plan:  -Treatment options discussed including all alternatives, risks, and complications -Etiology of symptoms were discussed -X-rays were obtained and reviewed with the patient.  3 views of the right foot were obtained.  Very minimal posterior spurring.  Calcaneal spurring present inferiorly. -Medrol dose pack-continue to resume  anti-inflammatories, ibuprofen -Discussed stretching, icing. -She has a next month, she had plantar fasciitis previously.  Recommended this as well. -Heel lift -Gel Achilles heel sleeve to protect the posterior heel -Discussed shoes and good arch support  Return in about 2 months (around 08/19/2021), or if symptoms worsen or fail to improve.  Trula Slade DPM

## 2021-06-23 DIAGNOSIS — M25562 Pain in left knee: Secondary | ICD-10-CM | POA: Diagnosis not present

## 2021-06-24 DIAGNOSIS — M7062 Trochanteric bursitis, left hip: Secondary | ICD-10-CM | POA: Diagnosis not present

## 2021-06-24 DIAGNOSIS — M25552 Pain in left hip: Secondary | ICD-10-CM | POA: Diagnosis not present

## 2021-07-06 DIAGNOSIS — D239 Other benign neoplasm of skin, unspecified: Secondary | ICD-10-CM | POA: Diagnosis not present

## 2021-07-06 DIAGNOSIS — L570 Actinic keratosis: Secondary | ICD-10-CM | POA: Diagnosis not present

## 2021-07-08 ENCOUNTER — Ambulatory Visit: Payer: Federal, State, Local not specified - PPO | Admitting: Adult Health

## 2021-07-13 ENCOUNTER — Ambulatory Visit: Payer: Federal, State, Local not specified - PPO | Admitting: Adult Health

## 2021-07-17 DIAGNOSIS — M25561 Pain in right knee: Secondary | ICD-10-CM | POA: Diagnosis not present

## 2021-07-17 DIAGNOSIS — M25661 Stiffness of right knee, not elsewhere classified: Secondary | ICD-10-CM | POA: Diagnosis not present

## 2021-07-22 ENCOUNTER — Other Ambulatory Visit (HOSPITAL_COMMUNITY): Payer: Self-pay | Admitting: Physician Assistant

## 2021-07-22 DIAGNOSIS — M79605 Pain in left leg: Secondary | ICD-10-CM

## 2021-07-26 IMAGING — DX DG CHEST 2V
2 series · 2 of 2 positions shown · non-contrast
Comparison: 10/06/2015

CLINICAL DATA: Short of breath for 1 week

EXAM:
CHEST - 2 VIEW

[chest pa]
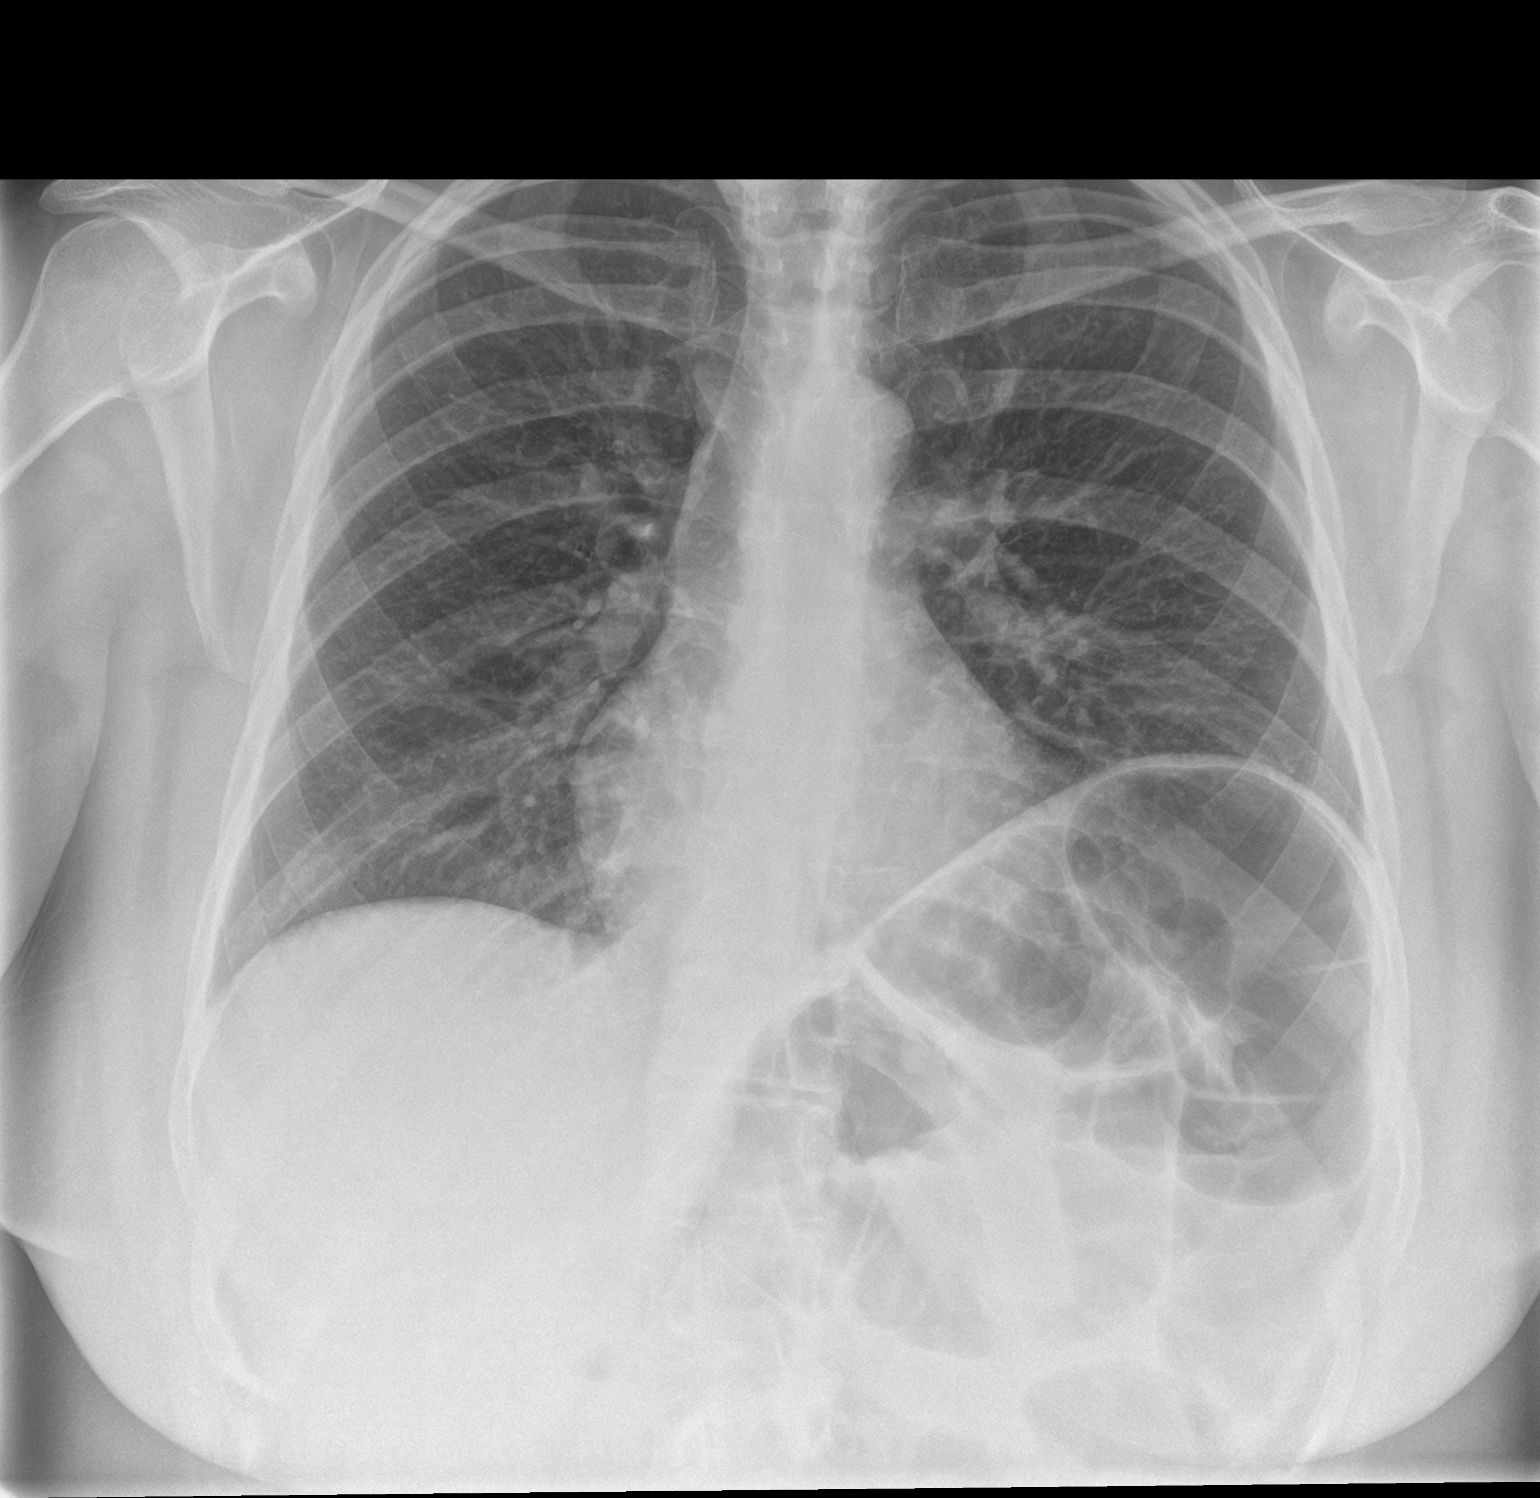

[chest lat]
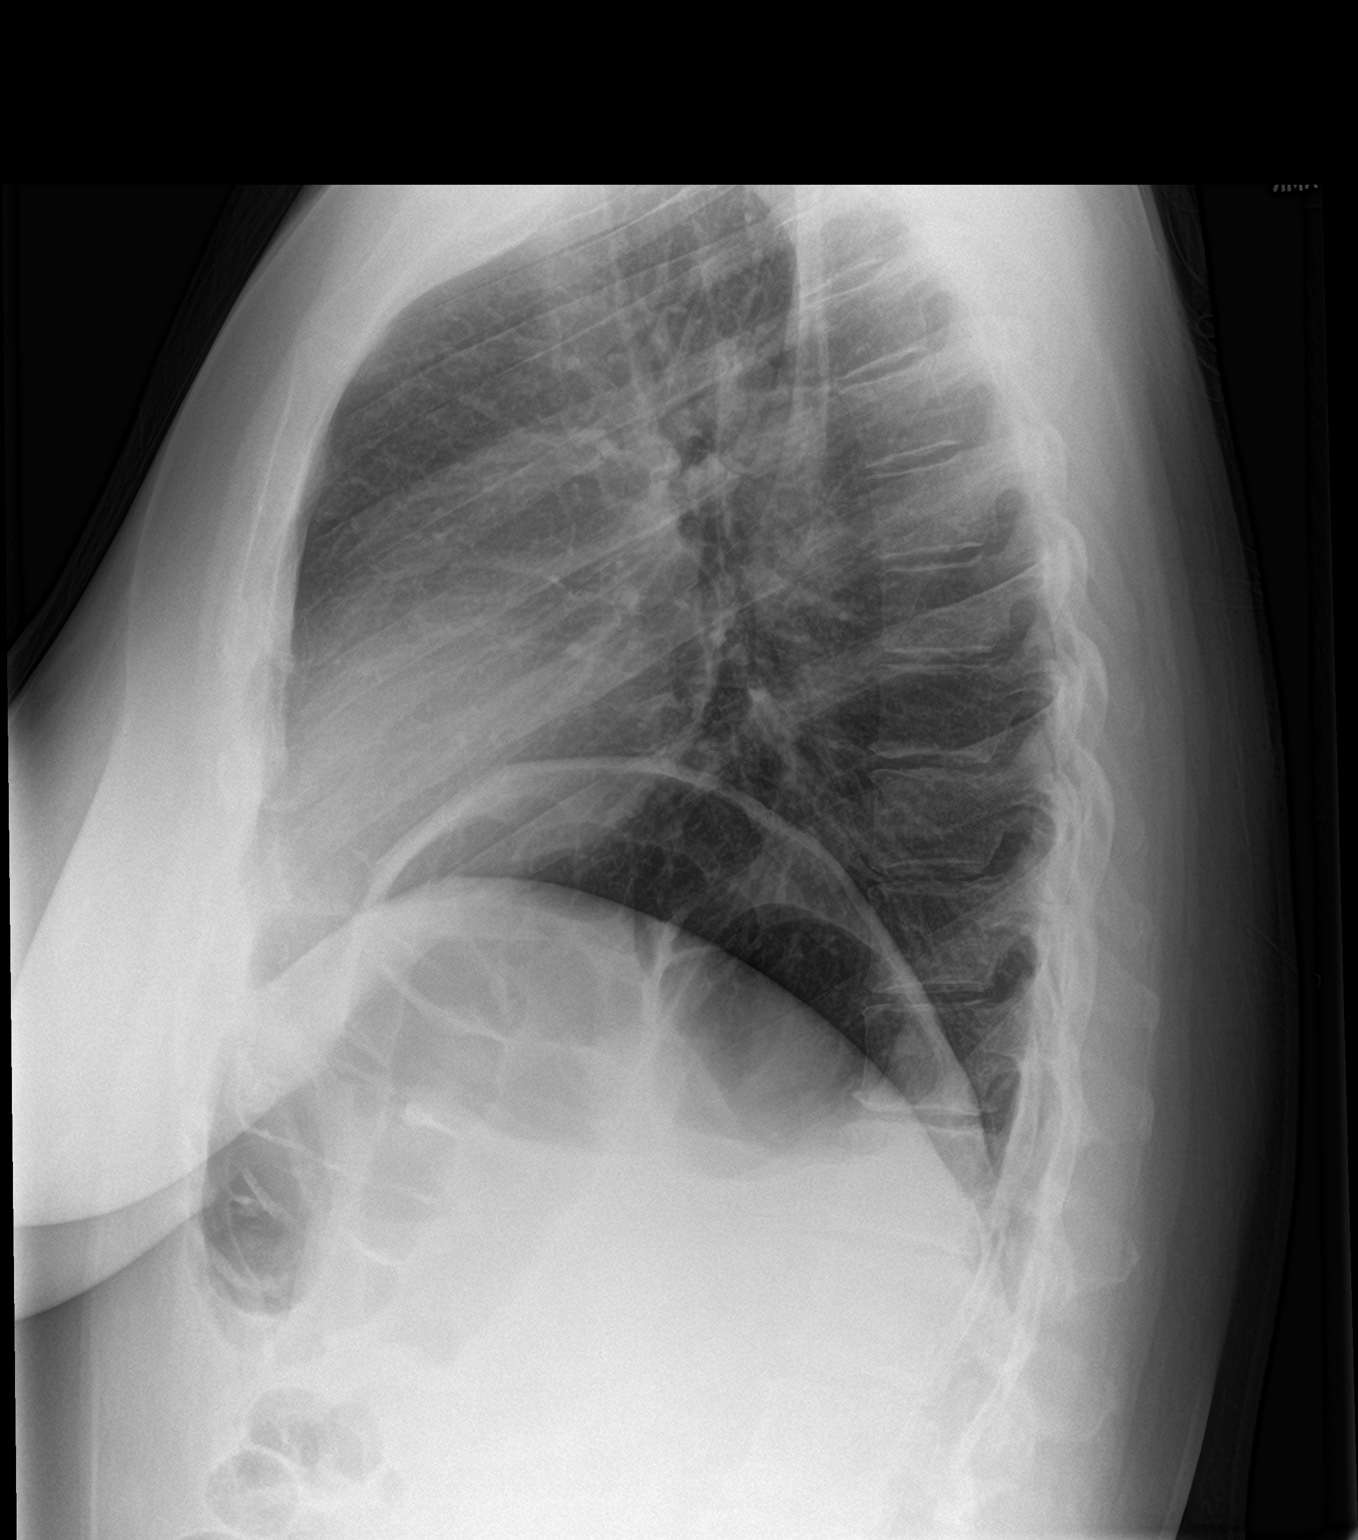

[2 of 2 positions shown; findings below may reference images not displayed]

FINDINGS: The heart size and mediastinal contours are within normal limits.
Both lungs are clear. The visualized skeletal structures are
unremarkable.

Chronic elevation of the left hemidiaphragm.
IMPRESSION: No active cardiopulmonary disease.

## 2021-07-30 ENCOUNTER — Inpatient Hospital Stay (HOSPITAL_COMMUNITY): Admission: RE | Admit: 2021-07-30 | Payer: Federal, State, Local not specified - PPO | Source: Ambulatory Visit

## 2021-07-30 ENCOUNTER — Encounter (HOSPITAL_COMMUNITY): Payer: Self-pay

## 2021-07-31 DIAGNOSIS — M25561 Pain in right knee: Secondary | ICD-10-CM | POA: Diagnosis not present

## 2021-07-31 DIAGNOSIS — M25661 Stiffness of right knee, not elsewhere classified: Secondary | ICD-10-CM | POA: Diagnosis not present

## 2021-08-31 DIAGNOSIS — M1711 Unilateral primary osteoarthritis, right knee: Secondary | ICD-10-CM | POA: Diagnosis not present

## 2021-09-04 ENCOUNTER — Other Ambulatory Visit (HOSPITAL_COMMUNITY): Payer: Self-pay | Admitting: Orthopedic Surgery

## 2021-09-04 ENCOUNTER — Other Ambulatory Visit: Payer: Self-pay | Admitting: Orthopedic Surgery

## 2021-09-04 DIAGNOSIS — M25552 Pain in left hip: Secondary | ICD-10-CM

## 2021-09-04 DIAGNOSIS — M1612 Unilateral primary osteoarthritis, left hip: Secondary | ICD-10-CM | POA: Diagnosis not present

## 2021-09-07 DIAGNOSIS — M1711 Unilateral primary osteoarthritis, right knee: Secondary | ICD-10-CM | POA: Diagnosis not present

## 2021-09-08 ENCOUNTER — Ambulatory Visit (HOSPITAL_COMMUNITY)
Admission: RE | Admit: 2021-09-08 | Discharge: 2021-09-08 | Disposition: A | Discharge status: Home / Self Care | Payer: Federal, State, Local not specified - PPO | Source: Ambulatory Visit | Attending: Orthopedic Surgery | Admitting: Orthopedic Surgery

## 2021-09-08 ENCOUNTER — Encounter (HOSPITAL_COMMUNITY): Payer: Self-pay

## 2021-09-08 DIAGNOSIS — M25552 Pain in left hip: Secondary | ICD-10-CM

## 2021-09-09 ENCOUNTER — Ambulatory Visit (HOSPITAL_COMMUNITY)
Admission: RE | Admit: 2021-09-09 | Discharge: 2021-09-09 | Disposition: A | Discharge status: Home / Self Care | Payer: Federal, State, Local not specified - PPO | Source: Ambulatory Visit | Attending: Orthopedic Surgery | Admitting: Orthopedic Surgery

## 2021-09-09 DIAGNOSIS — S73192A Other sprain of left hip, initial encounter: Secondary | ICD-10-CM | POA: Diagnosis not present

## 2021-09-09 DIAGNOSIS — M25552 Pain in left hip: Secondary | ICD-10-CM | POA: Insufficient documentation

## 2021-09-11 DIAGNOSIS — Z6841 Body Mass Index (BMI) 40.0 and over, adult: Secondary | ICD-10-CM | POA: Diagnosis not present

## 2021-09-11 DIAGNOSIS — U071 COVID-19: Secondary | ICD-10-CM | POA: Diagnosis not present

## 2021-09-11 DIAGNOSIS — R202 Paresthesia of skin: Secondary | ICD-10-CM | POA: Diagnosis not present

## 2021-09-11 DIAGNOSIS — R252 Cramp and spasm: Secondary | ICD-10-CM | POA: Diagnosis not present

## 2021-09-12 ENCOUNTER — Other Ambulatory Visit: Payer: Self-pay

## 2021-09-12 ENCOUNTER — Emergency Department (HOSPITAL_COMMUNITY): Payer: Federal, State, Local not specified - PPO

## 2021-09-12 ENCOUNTER — Encounter (HOSPITAL_COMMUNITY): Payer: Self-pay | Admitting: Emergency Medicine

## 2021-09-12 ENCOUNTER — Emergency Department (HOSPITAL_COMMUNITY)
Admission: EM | Admit: 2021-09-12 | Discharge: 2021-09-13 | Disposition: A | Discharge status: Home / Self Care | Payer: Federal, State, Local not specified - PPO | Attending: Emergency Medicine | Admitting: Emergency Medicine

## 2021-09-12 DIAGNOSIS — E860 Dehydration: Secondary | ICD-10-CM | POA: Insufficient documentation

## 2021-09-12 DIAGNOSIS — R079 Chest pain, unspecified: Secondary | ICD-10-CM | POA: Diagnosis not present

## 2021-09-12 DIAGNOSIS — U071 COVID-19: Secondary | ICD-10-CM

## 2021-09-12 DIAGNOSIS — R0789 Other chest pain: Secondary | ICD-10-CM | POA: Diagnosis not present

## 2021-09-12 MED ORDER — FENTANYL CITRATE (PF) 100 MCG/2ML IJ SOLN
100.0000 ug | Freq: Once | INTRAMUSCULAR | Status: AC
  Administered 2021-09-13: 100 ug via INTRAVENOUS
  Filled 2021-09-12: qty 2

## 2021-09-12 NOTE — ED Provider Notes (Signed)
[facility] EMERGENCY DEPARTMENT Provider Note   CSN: [id number] Arrival date & time: 09/12/21  2242     History {Add pertinent medical, surgical, social history, OB history to HPI:1} Chief Complaint  Patient presents with   Chest Pain    Meghan [name] is a 51 y.o. female.  51 year old female recently diagnosed with COVID the presents here with multiple issues.  Patient states that she had decreased appetite, sinus headache and chills and sweating for the last 24 hours.  She states that her urine is normally clear but she has been urinating quite darkly over the last half a day or so.  She also has some midline and lower back soreness along with right lower quadrant pain.  Significant weakness making it difficult to walk.  Nausea.   Chest Pain      Home Medications Prior to Admission medications   Medication Sig Start Date End Date Taking? Authorizing Provider  HYDROcodone-acetaminophen (NORCO/VICODIN) 5-325 MG tablet hydrocodone 5 mg-acetaminophen 325 mg tablet    [provider]  methylPREDNISolone (MEDROL DOSEPAK) 4 MG TBPK tablet Take as directed 06/19/21   Trula Slade, DPM  metoprolol tartrate (LOPRESSOR) 100 MG tablet Take one tablet two hours prior to Cardiac CT. 10/31/20   Arnoldo Lenis, MD  naproxen (NAPROSYN) 500 MG tablet Take 1 tablet (500 mg total) by mouth 2 (two) times daily with a meal. 11/18/20   Sanjuana Kava, MD  omeprazole (PRILOSEC) 20 MG capsule Take 1 capsule (20 mg total) by mouth 2 (two) times daily before a meal. Take 30 min before breakfast and 30 min before dinner 09/23/20 03/22/21  Eloise Harman, DO  predniSONE (STERAPRED UNI-PAK 21 TAB) 5 MG (21) TBPK tablet Take 6 pills first day; 5 pills second day; 4 pills third day; 3 pills fourth day; 2 pills next day and 1 pill last day. 02/10/21   Sanjuana Kava, MD  Semaglutide, 1 MG/DOSE, (OZEMPIC, 1 MG/DOSE,) 4 MG/3ML SOPN Inject 1 mg into the skin once a week.    [provider]       Allergies    Zanaflex [tizanidine hcl], Morphine, and Oxycodone-acetaminophen    Review of Systems   Review of Systems  Cardiovascular:  Positive for chest pain.    Physical Exam Updated Vital Signs BP 122/80 (BP Location: Right Arm)   Pulse (!) 118   Temp 98.2 F (36.8 C)   Resp 18   Ht '5\' 9"'$  (1.753 m)   Wt 122.5 kg   SpO2 97%   BMI 39.87 kg/m  Physical Exam Vitals and nursing note reviewed.  Constitutional:      Appearance: She is well-developed.  HENT:     Head: Normocephalic and atraumatic.  Cardiovascular:     Rate and Rhythm: Regular rhythm. Tachycardia present.  Pulmonary:     Effort: Tachypnea present. No respiratory distress.     Breath sounds: No stridor. No decreased breath sounds or wheezing.  Abdominal:     General: There is no distension.  Musculoskeletal:        General: Normal range of motion.     Cervical back: Normal range of motion.     Right lower leg: No edema.     Left lower leg: No edema.  Skin:    General: Skin is warm and dry.  Neurological:     General: No focal deficit present.     Mental Status: She is alert.     ED Results / Procedures / Treatments  Labs (all labs ordered are listed, but only abnormal results are displayed) Labs Reviewed  COMPREHENSIVE METABOLIC PANEL  CBC WITH DIFFERENTIAL/PLATELET  D-DIMER, QUANTITATIVE  URINALYSIS, ROUTINE W REFLEX MICROSCOPIC  POC URINE PREG, ED  TROPONIN I (HIGH SENSITIVITY)    EKG EKG Interpretation  Date/Time:  Saturday September 12 2021 23:26:46 EDT Ventricular Rate:  120 PR Interval:  132 QRS Duration: 78 QT Interval:  324 QTC Calculation: 457 R Axis:   7 Text Interpretation: Sinus tachycardia Nonspecific ST and T wave abnormality Abnormal ECG When compared with ECG of 06-Oct-2015 01:32, PREVIOUS ECG IS PRESENT Confirmed by Merrily Pew [phone]) on 09/12/2021 11:30:51 PM  Radiology No results found.  Procedures Procedures  {Document cardiac monitor, telemetry  assessment procedure when appropriate:1}  Medications Ordered in ED Medications  fentaNYL (SUBLIMAZE) injection 100 mcg (has no administration in time range)    ED Course/ Medical Decision Making/ A&P                           Medical Decision Making Amount and/or Complexity of Data Reviewed Labs: ordered. Radiology: ordered.  Risk Prescription drug management.  Likely dehydration from her acute illness we will going give some fluids.  Also consider possible pulmonary embolus with oxygen saturations in the mid 90s, tachycardia, pleuritic chest pain.  We will do a D-dimer.  Eval for electrolyte abnormalities. ***  {Document critical care time when appropriate:1} {Document review of labs and clinical decision tools ie heart score, Chads2Vasc2 etc:1}  {Document your independent review of radiology images, and any outside records:1} {Document your discussion with family members, caretakers, and with consultants:1} {Document social determinants of health affecting pt's care:1} {Document your decision making why or why not admission, treatments were needed:1} Final Clinical Impression(s) / ED Diagnoses Final diagnoses:  None    Rx / DC Orders ED Discharge Orders     None

## 2021-09-12 NOTE — ED Triage Notes (Signed)
Pt with c/o Chest pain that started yesterday. Pt rates it 8/10 and states it's in the middle of chest, radiates to under R breast, and to the middle of upper back.

## 2021-09-13 LAB — CBC WITH DIFFERENTIAL/PLATELET
Abs Immature Granulocytes: 0.01 10*3/uL (ref 0.00–0.07)
Basophils Absolute: 0 10*3/uL (ref 0.0–0.1)
Basophils Relative: 1 %
Eosinophils Absolute: 0 10*3/uL (ref 0.0–0.5)
Eosinophils Relative: 1 %
HCT: 48.4 % — ABNORMAL HIGH (ref 36.0–46.0)
Hemoglobin: 16 g/dL — ABNORMAL HIGH (ref 12.0–15.0)
Immature Granulocytes: 0 %
Lymphocytes Relative: 24 %
Lymphs Abs: 1.3 10*3/uL (ref 0.7–4.0)
MCH: 28.2 pg (ref 26.0–34.0)
MCHC: 33.1 g/dL (ref 30.0–36.0)
MCV: 85.2 fL (ref 80.0–100.0)
Monocytes Absolute: 0.8 10*3/uL (ref 0.1–1.0)
Monocytes Relative: 15 %
Neutro Abs: 3.2 10*3/uL (ref 1.7–7.7)
Neutrophils Relative %: 59 %
Platelets: 194 10*3/uL (ref 150–400)
RBC: 5.68 MIL/uL — ABNORMAL HIGH (ref 3.87–5.11)
RDW: 12.4 % (ref 11.5–15.5)
WBC: 5.4 10*3/uL (ref 4.0–10.5)
nRBC: 0 % (ref 0.0–0.2)

## 2021-09-13 LAB — URINALYSIS, ROUTINE W REFLEX MICROSCOPIC
Bilirubin Urine: NEGATIVE
Glucose, UA: NEGATIVE mg/dL
Hgb urine dipstick: NEGATIVE
Ketones, ur: 20 mg/dL — AB
Nitrite: NEGATIVE
Protein, ur: 30 mg/dL — AB
Specific Gravity, Urine: 1.027 (ref 1.005–1.030)
pH: 5 (ref 5.0–8.0)

## 2021-09-13 LAB — COMPREHENSIVE METABOLIC PANEL
ALT: 19 U/L (ref 0–44)
AST: 31 U/L (ref 15–41)
Albumin: 3.7 g/dL (ref 3.5–5.0)
Alkaline Phosphatase: 59 U/L (ref 38–126)
Anion gap: 8 (ref 5–15)
BUN: 18 mg/dL (ref 6–20)
CO2: 24 mmol/L (ref 22–32)
Calcium: 9.2 mg/dL (ref 8.9–10.3)
Chloride: 104 mmol/L (ref 98–111)
Creatinine, Ser: 0.78 mg/dL (ref 0.44–1.00)
GFR, Estimated: >60 mL/min (ref >60)
Glucose, Bld: 91 mg/dL (ref 70–99)
Potassium: 3.3 mmol/L — ABNORMAL LOW (ref 3.5–5.1)
Sodium: 136 mmol/L (ref 135–145)
Total Bilirubin: 0.7 mg/dL (ref 0.3–1.2)
Total Protein: 7 g/dL (ref 6.5–8.1)

## 2021-09-13 LAB — D-DIMER, QUANTITATIVE: D-Dimer, Quant: 0.38 ug/mL-FEU (ref 0.00–0.50)

## 2021-09-13 LAB — TROPONIN I (HIGH SENSITIVITY)
Troponin I (High Sensitivity): 3 ng/L (ref <18)
Troponin I (High Sensitivity): 3 ng/L (ref <18)

## 2021-09-13 LAB — POC URINE PREG, ED: Preg Test, Ur: NEGATIVE

## 2021-09-13 MED ORDER — DIAZEPAM 5 MG PO TABS: 5.0000 mg | ORAL_TABLET | Freq: Four times a day (QID) | ORAL | 0 refills | Status: DC | PRN

## 2021-09-13 MED ORDER — DIAZEPAM 5 MG PO TABS
5.0000 mg | ORAL_TABLET | Freq: Once | ORAL | Status: AC
  Administered 2021-09-13: 5 mg via ORAL
  Filled 2021-09-13: qty 1

## 2021-09-13 MED ORDER — LACTATED RINGERS IV BOLUS
1000.0000 mL | Freq: Once | INTRAVENOUS | Status: AC
  Administered 2021-09-13: 1000 mL via INTRAVENOUS

## 2021-09-16 DIAGNOSIS — M1711 Unilateral primary osteoarthritis, right knee: Secondary | ICD-10-CM | POA: Diagnosis not present

## 2021-09-22 ENCOUNTER — Other Ambulatory Visit (HOSPITAL_COMMUNITY): Payer: Federal, State, Local not specified - PPO

## 2021-09-25 DIAGNOSIS — R202 Paresthesia of skin: Secondary | ICD-10-CM | POA: Diagnosis not present

## 2021-09-29 DIAGNOSIS — M1612 Unilateral primary osteoarthritis, left hip: Secondary | ICD-10-CM | POA: Diagnosis not present

## 2021-11-03 DIAGNOSIS — K080 Exfoliation of teeth due to systemic causes: Secondary | ICD-10-CM | POA: Diagnosis not present

## 2021-11-25 ENCOUNTER — Other Ambulatory Visit: Payer: Self-pay

## 2021-11-25 ENCOUNTER — Emergency Department (HOSPITAL_COMMUNITY): Payer: Federal, State, Local not specified - PPO

## 2021-11-25 ENCOUNTER — Encounter (HOSPITAL_COMMUNITY): Payer: Self-pay

## 2021-11-25 ENCOUNTER — Emergency Department (HOSPITAL_COMMUNITY)
Admission: EM | Admit: 2021-11-25 | Discharge: 2021-11-25 | Disposition: A | Discharge status: Home / Self Care | Payer: Federal, State, Local not specified - PPO | Attending: Emergency Medicine | Admitting: Emergency Medicine

## 2021-11-25 DIAGNOSIS — A084 Viral intestinal infection, unspecified: Secondary | ICD-10-CM

## 2021-11-25 DIAGNOSIS — R1084 Generalized abdominal pain: Secondary | ICD-10-CM | POA: Diagnosis not present

## 2021-11-25 DIAGNOSIS — R109 Unspecified abdominal pain: Secondary | ICD-10-CM | POA: Diagnosis not present

## 2021-11-25 DIAGNOSIS — K529 Noninfective gastroenteritis and colitis, unspecified: Secondary | ICD-10-CM | POA: Insufficient documentation

## 2021-11-25 LAB — COMPREHENSIVE METABOLIC PANEL
ALT: 20 U/L (ref 0–44)
AST: 33 U/L (ref 15–41)
Albumin: 3.5 g/dL (ref 3.5–5.0)
Alkaline Phosphatase: 57 U/L (ref 38–126)
Anion gap: 6 (ref 5–15)
BUN: 20 mg/dL (ref 6–20)
CO2: 24 mmol/L (ref 22–32)
Calcium: 8.3 mg/dL — ABNORMAL LOW (ref 8.9–10.3)
Chloride: 108 mmol/L (ref 98–111)
Creatinine, Ser: 0.62 mg/dL (ref 0.44–1.00)
GFR, Estimated: >60 mL/min (ref >60)
Glucose, Bld: 99 mg/dL (ref 70–99)
Potassium: 3.7 mmol/L (ref 3.5–5.1)
Sodium: 138 mmol/L (ref 135–145)
Total Bilirubin: 0.5 mg/dL (ref 0.3–1.2)
Total Protein: 6.5 g/dL (ref 6.5–8.1)

## 2021-11-25 LAB — CBC WITH DIFFERENTIAL/PLATELET
Abs Immature Granulocytes: 0.02 10*3/uL (ref 0.00–0.07)
Basophils Absolute: 0 10*3/uL (ref 0.0–0.1)
Basophils Relative: 0 %
Eosinophils Absolute: 0 10*3/uL (ref 0.0–0.5)
Eosinophils Relative: 0 %
HCT: 45.4 % (ref 36.0–46.0)
Hemoglobin: 14.6 g/dL (ref 12.0–15.0)
Immature Granulocytes: 0 %
Lymphocytes Relative: 3 %
Lymphs Abs: 0.2 10*3/uL — ABNORMAL LOW (ref 0.7–4.0)
MCH: 28 pg (ref 26.0–34.0)
MCHC: 32.2 g/dL (ref 30.0–36.0)
MCV: 87.1 fL (ref 80.0–100.0)
Monocytes Absolute: 0.3 10*3/uL (ref 0.1–1.0)
Monocytes Relative: 4 %
Neutro Abs: 7.5 10*3/uL (ref 1.7–7.7)
Neutrophils Relative %: 93 %
Platelets: 150 10*3/uL (ref 150–400)
RBC: 5.21 MIL/uL — ABNORMAL HIGH (ref 3.87–5.11)
RDW: 14.1 % (ref 11.5–15.5)
WBC: 8.1 10*3/uL (ref 4.0–10.5)
nRBC: 0 % (ref 0.0–0.2)

## 2021-11-25 LAB — LIPASE, BLOOD: Lipase: 40 U/L (ref 11–51)

## 2021-11-25 LAB — URINALYSIS, ROUTINE W REFLEX MICROSCOPIC
Bilirubin Urine: NEGATIVE
Glucose, UA: NEGATIVE mg/dL
Hgb urine dipstick: NEGATIVE
Ketones, ur: NEGATIVE mg/dL
Leukocytes,Ua: NEGATIVE
Nitrite: NEGATIVE
Protein, ur: NEGATIVE mg/dL
Specific Gravity, Urine: 1.023 (ref 1.005–1.030)
pH: 5 (ref 5.0–8.0)

## 2021-11-25 LAB — MAGNESIUM: Magnesium: 1.8 mg/dL (ref 1.7–2.4)

## 2021-11-25 MED ORDER — ONDANSETRON 4 MG PO TBDP: 4.0000 mg | ORAL_TABLET | Freq: Three times a day (TID) | ORAL | 0 refills | Status: DC | PRN

## 2021-11-25 MED ORDER — DICYCLOMINE HCL 10 MG/ML IM SOLN
20.0000 mg | Freq: Once | INTRAMUSCULAR | Status: AC
  Administered 2021-11-25: 20 mg via INTRAMUSCULAR
  Filled 2021-11-25: qty 2

## 2021-11-25 MED ORDER — IOHEXOL 300 MG/ML  SOLN
100.0000 mL | Freq: Once | INTRAMUSCULAR | Status: AC | PRN
  Administered 2021-11-25: 100 mL via INTRAVENOUS

## 2021-11-25 MED ORDER — HYDROMORPHONE HCL 1 MG/ML IJ SOLN
1.0000 mg | Freq: Once | INTRAMUSCULAR | Status: AC
  Administered 2021-11-25: 1 mg via INTRAVENOUS
  Filled 2021-11-25: qty 1

## 2021-11-25 MED ORDER — KETOROLAC TROMETHAMINE 30 MG/ML IJ SOLN
30.0000 mg | Freq: Once | INTRAMUSCULAR | Status: AC
  Administered 2021-11-25: 30 mg via INTRAVENOUS
  Filled 2021-11-25: qty 1

## 2021-11-25 MED ORDER — DICYCLOMINE HCL 20 MG PO TABS: 20.0000 mg | ORAL_TABLET | Freq: Three times a day (TID) | ORAL | 0 refills | Status: DC | PRN

## 2021-11-25 NOTE — ED Provider Notes (Signed)
[facility] EMERGENCY DEPARTMENT Provider Note   CSN: [id number] Arrival date & time: 11/25/21  0757     History  Chief Complaint  Patient presents with   Abdominal Pain    Meghan [name] is a 51 y.o. female presenting to the emergency department with abdominal pain.  She reports that she woke up earlier this morning with abdominal pain, which has been diffuse, as well as severe reflux, and then 6-7 episodes of dark diarrhea.  She continues to feel nauseated and has stabbing pains all over the abdomen.  She denies any sick contacts in the house.  She denies any fevers or chills.  She said she ate a fairly normal diet yesterday, although at work she did have takeout "salsaritas" and "taco meat."  She reports her only abdominal surgeries were tubal ligation and ablation.  She took Zofran and Pepcid at home.  She denies prior history of C. Difficile infection.  She did complete a course of doxycycline antibiotics for face infection about 7 days ago.  Present emerged part of abdominal       Home Medications Prior to Admission medications   Medication Sig Start Date End Date Taking? Authorizing Provider  dicyclomine (BENTYL) 20 MG tablet Take 1 tablet (20 mg total) by mouth 3 (three) times daily as needed for up to 21 doses for spasms. 11/25/21  Yes Clarie Camey, Carola Rhine, MD  ondansetron (ZOFRAN-ODT) 4 MG disintegrating tablet Take 1 tablet (4 mg total) by mouth every 8 (eight) hours as needed for up to 15 doses for nausea or vomiting. 11/25/21  Yes Moriah Shawley, Carola Rhine, MD  diazepam (VALIUM) 5 MG tablet Take 1 tablet (5 mg total) by mouth every 6 (six) hours as needed for anxiety (spasms). Patient not taking: Reported on 11/25/2021 09/13/21   Mesner, Corene Cornea, MD  methylPREDNISolone (MEDROL DOSEPAK) 4 MG TBPK tablet Take as directed Patient not taking: Reported on 11/25/2021 06/19/21   Trula Slade, DPM  metoprolol tartrate (LOPRESSOR) 100 MG tablet Take one tablet two hours prior to  Cardiac CT. Patient not taking: Reported on 11/25/2021 10/31/20   Arnoldo Lenis, MD  naproxen (NAPROSYN) 500 MG tablet Take 1 tablet (500 mg total) by mouth 2 (two) times daily with a meal. Patient not taking: Reported on 11/25/2021 11/18/20   Sanjuana Kava, MD  omeprazole (PRILOSEC) 20 MG capsule Take 1 capsule (20 mg total) by mouth 2 (two) times daily before a meal. Take 30 min before breakfast and 30 min before dinner 09/23/20 03/22/21  Eloise Harman, DO  predniSONE (STERAPRED UNI-PAK 21 TAB) 5 MG (21) TBPK tablet Take 6 pills first day; 5 pills second day; 4 pills third day; 3 pills fourth day; 2 pills next day and 1 pill last day. Patient not taking: Reported on 11/25/2021 02/10/21   Sanjuana Kava, MD  Semaglutide, 1 MG/DOSE, (OZEMPIC, 1 MG/DOSE,) 4 MG/3ML SOPN Inject 1 mg into the skin once a week. Patient not taking: Reported on 11/25/2021    [provider]      Allergies    Oxycodone-acetaminophen, Zanaflex [tizanidine hcl], Morphine, and Oxycodone-acetaminophen    Review of Systems   Review of Systems  Physical Exam Updated Vital Signs BP 122/73 (BP Location: Left Arm)   Pulse (!) 111   Temp 98.7 F (37.1 C) (Oral)   Resp 16   Ht '5\' 9"'$  (1.753 m)   Wt 120 kg   SpO2 94%   BMI 39.07 kg/m  Physical Exam Constitutional:  General: She is not in acute distress.    Appearance: She is obese.  HENT:     Head: Normocephalic and atraumatic.  Eyes:     Conjunctiva/sclera: Conjunctivae normal.     Pupils: Pupils are equal, round, and reactive to light.  Cardiovascular:     Rate and Rhythm: Normal rate and regular rhythm.  Pulmonary:     Effort: Pulmonary effort is normal. No respiratory distress.  Abdominal:     General: There is no distension.     Tenderness: There is generalized abdominal tenderness.  Skin:    General: Skin is warm and dry.  Neurological:     General: No focal deficit present.     Mental Status: She is alert. Mental status is at baseline.   Psychiatric:        Mood and Affect: Mood normal.        Behavior: Behavior normal.     ED Results / Procedures / Treatments   Labs (all labs ordered are listed, but only abnormal results are displayed) Labs Reviewed  COMPREHENSIVE METABOLIC PANEL - Abnormal; Notable for the following components:      Result Value   Calcium 8.3 (*)    All other components within normal limits  CBC WITH DIFFERENTIAL/PLATELET - Abnormal; Notable for the following components:   RBC 5.21 (*)    Lymphs Abs 0.2 (*)    All other components within normal limits  LIPASE, BLOOD  URINALYSIS, ROUTINE W REFLEX MICROSCOPIC  MAGNESIUM    EKG None  Radiology CT ABDOMEN PELVIS W CONTRAST  Result Date: 11/25/2021 CLINICAL DATA:  Nausea/vomiting/diarrhea. Evaluate for colitis. Diffuse cramping abdominal pain. EXAM: CT ABDOMEN AND PELVIS WITH CONTRAST TECHNIQUE: Multidetector CT imaging of the abdomen and pelvis was performed using the standard protocol following bolus administration of intravenous contrast. RADIATION DOSE REDUCTION: This exam was performed according to the departmental dose-optimization program which includes automated exposure control, adjustment of the mA and/or kV according to patient size and/or use of iterative reconstruction technique. CONTRAST:  175m OMNIPAQUE IOHEXOL 300 MG/ML  SOLN COMPARISON:  CT examination dated July 09, 2020 FINDINGS: Lower chest: No acute abnormality. Hepatobiliary: No focal liver abnormality is seen. No gallstones, gallbladder wall thickening, or biliary dilatation. Pancreas: Unremarkable. No pancreatic ductal dilatation or surrounding inflammatory changes. Spleen: Normal in size without focal abnormality. Adrenals/Urinary Tract: Adrenal glands are unremarkable. Punctate nonobstructing calculus in the lower pole of the right kidney. No evidence of hydronephrosis or ureteral calculus. Bladder is unremarkable. Stomach/Bowel: Stomach is within normal limits. Appendix appears  normal. No evidence of bowel wall thickening, distention, or inflammatory changes. There is liquid stool throughout the bowel loops with mild mucosal enhancement, which may represent gastroenteritis in appropriate clinical settings. Vascular/Lymphatic: No significant vascular findings are present. No enlarged abdominal or pelvic lymph nodes. Reproductive: No acute abnormality. Exophytic fibroid in the anterior uterine wall. No adnexal mass. Other: No abdominal wall hernia or abnormality. No abdominopelvic ascites. 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 No acute osseous abnormality. IMPRESSION: 1. Liquid stool throughout the bowel loops with mild mucosal enhancement, which may represent gastroenteritis in appropriate clinical settings. 2. Normal appendix. 3. Punctate nonobstructive calculus in the lower pole of the right kidney. No evidence of hydronephrosis or ureteral calculus. 4. Exophytic fibroid in the anterior uterine wall. 5. 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 Electronically Signed   By: IKeane PoliceD.O.   On: 11/25/2021 11:16    Procedures Procedures  Medications Ordered in ED Medications  dicyclomine (BENTYL) injection 20 mg (20 mg Intramuscular Given 11/25/21 0835)  ketorolac (TORADOL) 30 MG/ML injection 30 mg (30 mg Intravenous Given 11/25/21 0835)  iohexol (OMNIPAQUE) 300 MG/ML solution 100 mL (100 mLs Intravenous Contrast Given 11/25/21 1100)  HYDROmorphone (DILAUDID) injection 1 mg (1 mg Intravenous Given 11/25/21 1237)    ED Course/ Medical Decision Making/ A&P Clinical Course as of 11/25/21 1735  Wed Nov 25, 2021  1227 Patient reassessed, explained this is likely viral gastroenteritis per CT imaging and lab work.  We will give her a milligram of Dilaudid here to help her sleep when she gets, she feels exhausted.  I do anticipate her symptoms improve over the next 2 to 3 days.  Contact and handwashing precautions were  given for home.  She verbalized understanding [MT]    Clinical Course User Index [MT] Ashauna Bertholf, Carola Rhine, MD                           Medical Decision Making Amount and/or Complexity of Data Reviewed Labs: ordered. Radiology: ordered.  Risk Prescription drug management.   This patient presents to the Emergency Department with complaint of abdominal pain. This involves an extensive number of treatment options, and is a complaint that carries with it a high risk of complications and morbidity.  The differential diagnosis includes, but is not limited to, gastritis vs biliary disease vs peptic ulcer vs constipation vs colitis vs UTI vs other  I ordered, reviewed, and interpreted labs, including BMP and CBC.  There were no immediate, life-threatening emergencies found in this labwork.  Patient's UA showed no signs of infection I ordered medication IM Bentyl, IV Toradol for abdominal pain and/or nausea I ordered imaging studies which included CT abdomen pelvis  I independently visualized and interpreted imaging which showed findings consistent with enteritis; and the monitor tracing which showed NSR  After the interventions stated above, I reevaluated the patient and found that they remained clinically stable.  Based on the patient's clinical exam, vital signs, risk factors, and ED testing, I felt that the patient's overall risk of life-threatening emergency such as bowel perforation, surgical emergency, or sepsis was quite low.  I suspect this clinical presentation is most consistent with viral gastroenteritis, but explained to the patient that this evaluation was not a definitive diagnostic workup.  I discussed outpatient follow up with primary care provider, and provided specialist office number on the patient's discharge paper if a referral was deemed necessary.  I discussed return precautions with the patient. I felt the patient was clinically stable for discharge.         Final  Clinical Impression(s) / ED Diagnoses Final diagnoses:  Generalized abdominal pain  Viral gastroenteritis    Rx / DC Orders ED Discharge Orders          Ordered    ondansetron (ZOFRAN-ODT) 4 MG disintegrating tablet  Every 8 hours PRN        11/25/21 1229    dicyclomine (BENTYL) 20 MG tablet  3 times daily PRN        11/25/21 1229              Wyvonnia Dusky, MD 11/25/21 1736

## 2021-11-25 NOTE — ED Triage Notes (Signed)
Patient states she woke up this morning with abdominal pain and diarrhea. Patient states nausea and took a zofran to keep from throwing up. Left sided abdominal pian with black loose stools per patient.

## 2021-11-25 NOTE — Discharge Instructions (Addendum)
Please wash your hands and keep your distance at least 5 feet from other contacts in the house for the next 5 days.  A lot of viral stomach bugs can be transmitted through respiratory or saliva droplets, and also within your stool.  Remember to wash your hands after using the bathroom.  If you have cramping pain I would also recommend using heating bottles on your abdomen which can help relax your bowels.  Continue to drink plenty of fluids at home, consider Gatorade or similar products that have potassium and electrolyte repletion for your diarrhea.  We gave you a shot of Dilaudid which is a narcotic opioid pain medication in the ED, to help with your acute pain and to help you sleep.  Do not drive today.

## 2021-12-18 DIAGNOSIS — M1712 Unilateral primary osteoarthritis, left knee: Secondary | ICD-10-CM | POA: Diagnosis not present

## 2021-12-18 DIAGNOSIS — M25562 Pain in left knee: Secondary | ICD-10-CM | POA: Diagnosis not present

## 2022-01-01 DIAGNOSIS — M25562 Pain in left knee: Secondary | ICD-10-CM | POA: Diagnosis not present

## 2022-01-15 ENCOUNTER — Other Ambulatory Visit (HOSPITAL_COMMUNITY): Payer: Self-pay | Admitting: Family Medicine

## 2022-01-15 DIAGNOSIS — R102 Pelvic and perineal pain: Secondary | ICD-10-CM | POA: Diagnosis not present

## 2022-01-15 DIAGNOSIS — M25562 Pain in left knee: Secondary | ICD-10-CM | POA: Diagnosis not present

## 2022-01-21 ENCOUNTER — Ambulatory Visit (HOSPITAL_COMMUNITY): Payer: Federal, State, Local not specified - PPO

## 2022-01-21 ENCOUNTER — Encounter (HOSPITAL_COMMUNITY): Payer: Self-pay

## 2022-02-03 ENCOUNTER — Ambulatory Visit (HOSPITAL_COMMUNITY)
Admission: RE | Admit: 2022-02-03 | Discharge: 2022-02-03 | Disposition: A | Discharge status: Home / Self Care | Payer: Federal, State, Local not specified - PPO | Source: Ambulatory Visit | Attending: Family Medicine | Admitting: Family Medicine

## 2022-02-03 DIAGNOSIS — R102 Pelvic and perineal pain: Secondary | ICD-10-CM | POA: Diagnosis not present

## 2022-02-03 DIAGNOSIS — D251 Intramural leiomyoma of uterus: Secondary | ICD-10-CM | POA: Diagnosis not present

## 2022-02-05 DIAGNOSIS — M25562 Pain in left knee: Secondary | ICD-10-CM | POA: Diagnosis not present

## 2022-02-16 DIAGNOSIS — Z6841 Body Mass Index (BMI) 40.0 and over, adult: Secondary | ICD-10-CM | POA: Diagnosis not present

## 2022-02-16 DIAGNOSIS — Z7182 Exercise counseling: Secondary | ICD-10-CM | POA: Diagnosis not present

## 2022-02-16 DIAGNOSIS — Z713 Dietary counseling and surveillance: Secondary | ICD-10-CM | POA: Diagnosis not present

## 2022-02-19 ENCOUNTER — Ambulatory Visit: Payer: Federal, State, Local not specified - PPO | Admitting: Adult Health

## 2022-03-03 DIAGNOSIS — L700 Acne vulgaris: Secondary | ICD-10-CM | POA: Diagnosis not present

## 2022-03-18 ENCOUNTER — Encounter: Payer: Self-pay | Admitting: Radiology

## 2022-04-23 ENCOUNTER — Ambulatory Visit: Payer: Federal, State, Local not specified - PPO | Admitting: Adult Health

## 2022-08-18 DIAGNOSIS — M1712 Unilateral primary osteoarthritis, left knee: Secondary | ICD-10-CM | POA: Diagnosis not present

## 2022-09-02 ENCOUNTER — Ambulatory Visit: Payer: Federal, State, Local not specified - PPO | Admitting: Adult Health

## 2022-09-02 ENCOUNTER — Ambulatory Visit (HOSPITAL_COMMUNITY)
Admission: RE | Admit: 2022-09-02 | Discharge: 2022-09-02 | Disposition: A | Discharge status: Home / Self Care | Payer: Federal, State, Local not specified - PPO | Source: Ambulatory Visit | Attending: Adult Health | Admitting: Adult Health

## 2022-09-02 ENCOUNTER — Other Ambulatory Visit (HOSPITAL_COMMUNITY)
Admission: RE | Admit: 2022-09-02 | Discharge: 2022-09-02 | Disposition: A | Discharge status: Home / Self Care | Payer: Federal, State, Local not specified - PPO | Source: Ambulatory Visit | Attending: Adult Health | Admitting: Adult Health

## 2022-09-02 ENCOUNTER — Encounter: Payer: Self-pay | Admitting: Adult Health

## 2022-09-02 VITALS — BP 130/80 | HR 71 | Ht 69.0 in | Wt 265.0 lb

## 2022-09-02 DIAGNOSIS — N858 Other specified noninflammatory disorders of uterus: Secondary | ICD-10-CM | POA: Diagnosis not present

## 2022-09-02 DIAGNOSIS — N854 Malposition of uterus: Secondary | ICD-10-CM | POA: Diagnosis not present

## 2022-09-02 DIAGNOSIS — Z124 Encounter for screening for malignant neoplasm of cervix: Secondary | ICD-10-CM

## 2022-09-02 DIAGNOSIS — N950 Postmenopausal bleeding: Secondary | ICD-10-CM | POA: Insufficient documentation

## 2022-09-02 DIAGNOSIS — N83299 Other ovarian cyst, unspecified side: Secondary | ICD-10-CM | POA: Diagnosis not present

## 2022-09-02 NOTE — Progress Notes (Signed)
  Subjective:     Patient ID: Meghan [name], female   DOB: [birth year], 52 y.o.   MRN: [id number]  HPI [name] is a 52 year old white female, married, G2P2, PM in complaining of vaginal bleeding, red with some cramps. Last period was 2022. She had ablation in about 2010, but it did not work. She needs a pap too. Has pain RLQ for years.  PCP is Dr Margo Aye.   Review of Systems +PMB Chronic RLQ pain Reviewed past medical,surgical, social and family history. Reviewed medications and allergies.     Objective:   Physical Exam BP 130/80 (BP Location: Left Arm, Patient Position: Sitting, Cuff Size: Large)   Pulse 71   Ht 5\' 9"  (1.753 m)   Wt 265 lb (120.2 kg)   LMP 09/01/2022 (Exact Date) Comment: Bright red, haven't had bleeding since Nov 2022  BMI 39.13 kg/m     Skin warm and dry.Pelvic: external genitalia is normal in appearance no lesions, vagina: + red blood,urethra has no lesions or masses noted, cervix:smooth and bulbous,pap with HR HPV genotyping performed, uterus: normal size, shape and contour, non tender, no masses felt, adnexa: no masses or tenderness noted. Bladder is non tender and no masses felt. Fall risk is low  Upstream - 09/02/22 1105       Pregnancy Intention Screening   Does the patient want to become pregnant in the next year? No    Does the patient's partner want to become pregnant in the next year? No    Would the patient like to discuss contraceptive options today? No      Contraception Wrap Up   Current Method Vasectomy   ablation   End Method Vasectomy   ablation   Contraception Counseling Provided No            Examination chaperoned by Freddie Apley RN   Assessment:     1. PMB (postmenopausal bleeding) +vaginal bleeding Last period in 2022 Will get [place] at [facility] today at 2:30 pm to assess uterus and ovaries Pt aware if endometrium thickened will need biopsy  - US PELVIC COMPLETE WITH TRANSVAGINAL; Future  2. Routine Papanicolaou  smear Pap sent Pap in 3 years if normal    Plan:     Follow up TBD

## 2022-09-02 NOTE — Addendum Note (Signed)
Addended by: Freddie Apley R on: 09/02/2022 12:44 PM   Modules accepted: Orders

## 2022-09-08 ENCOUNTER — Telehealth: Payer: Self-pay | Admitting: Adult Health

## 2022-09-08 LAB — CYTOLOGY - PAP
Comment: NEGATIVE
Diagnosis: NEGATIVE
Diagnosis: REACTIVE
High risk HPV: NEGATIVE

## 2022-09-08 NOTE — Telephone Encounter (Signed)
Left message that pap was normal, and could not see endometrium on [place] will  need to get biopsy, call for appt for endometrial biopsy

## 2022-10-04 ENCOUNTER — Encounter: Payer: Self-pay | Admitting: Obstetrics & Gynecology

## 2022-10-04 ENCOUNTER — Other Ambulatory Visit (HOSPITAL_COMMUNITY)
Admission: RE | Admit: 2022-10-04 | Discharge: 2022-10-04 | Disposition: A | Discharge status: Home / Self Care | Payer: Federal, State, Local not specified - PPO | Source: Ambulatory Visit | Attending: Obstetrics & Gynecology | Admitting: Obstetrics & Gynecology

## 2022-10-04 ENCOUNTER — Ambulatory Visit: Payer: Federal, State, Local not specified - PPO | Admitting: Obstetrics & Gynecology

## 2022-10-04 VITALS — BP 114/68 | HR 78

## 2022-10-04 DIAGNOSIS — N950 Postmenopausal bleeding: Secondary | ICD-10-CM

## 2022-10-04 DIAGNOSIS — N858 Other specified noninflammatory disorders of uterus: Secondary | ICD-10-CM | POA: Diagnosis not present

## 2022-10-04 NOTE — Progress Notes (Signed)
Endometrial Biopsy Procedure Note  Pre-operative Diagnosis: Post menopausal bleeding with thickened endometrium on sonogram  S/P endometrial ablation 2010ish  Post-operative Diagnosis: same  Indications: postmenopausal bleeding  Procedure Details   Urine pregnancy test was not done.  The risks (including infection, bleeding, pain, and uterine perforation) and benefits of the procedure were explained to the patient and Written informed consent was obtained.  Antibiotic prophylaxis against endocarditis was not indicated.   The patient was placed in the dorsal lithotomy position.  Bimanual exam showed the uterus to be in the neutral position.  A Graves' speculum inserted in the vagina, and the cervix prepped with povidone iodine.  Endocervical curettage with a Kevorkian curette was not performed.   A sharp tenaculum was applied to the anterior lip of the cervix for stabilization.  A sterile uterine sound was used to sound the uterus to a depth of 6.5 cm.  A Pipelle endometrial aspirator was used to sample the endometrium.  Sample was sent for pathologic examination.  Condition: Stable  Complications: None  Plan:  The patient was advised to call for any fever or for prolonged or severe pain or bleeding. She was advised to use OTC analgesics as needed for mild to moderate pain. She was advised to avoid vaginal intercourse for 48 hours or until the bleeding has completely stopped.  Attending Physician Documentation: I was present for or performed the following: endometrial biopsy

## 2022-10-06 ENCOUNTER — Other Ambulatory Visit: Payer: Self-pay | Admitting: Obstetrics & Gynecology

## 2022-10-06 MED ORDER — PROGESTERONE 200 MG PO CAPS: ORAL_CAPSULE | ORAL | 3 refills | Status: DC

## 2022-10-13 ENCOUNTER — Encounter: Payer: Self-pay | Admitting: Obstetrics & Gynecology

## 2022-11-16 DIAGNOSIS — Z0000 Encounter for general adult medical examination without abnormal findings: Secondary | ICD-10-CM | POA: Diagnosis not present

## 2022-11-19 DIAGNOSIS — E875 Hyperkalemia: Secondary | ICD-10-CM | POA: Diagnosis not present

## 2022-11-19 DIAGNOSIS — E785 Hyperlipidemia, unspecified: Secondary | ICD-10-CM | POA: Diagnosis not present

## 2022-11-19 DIAGNOSIS — N3944 Nocturnal enuresis: Secondary | ICD-10-CM | POA: Diagnosis not present

## 2022-11-23 ENCOUNTER — Emergency Department (HOSPITAL_COMMUNITY)
Admission: EM | Admit: 2022-11-23 | Discharge: 2022-11-23 | Disposition: A | Discharge status: Home / Self Care | Payer: Federal, State, Local not specified - PPO | Attending: Student | Admitting: Student

## 2022-11-23 ENCOUNTER — Encounter (HOSPITAL_COMMUNITY): Payer: Self-pay

## 2022-11-23 ENCOUNTER — Other Ambulatory Visit: Payer: Self-pay

## 2022-11-23 DIAGNOSIS — R7989 Other specified abnormal findings of blood chemistry: Secondary | ICD-10-CM | POA: Insufficient documentation

## 2022-11-23 DIAGNOSIS — R42 Dizziness and giddiness: Secondary | ICD-10-CM | POA: Diagnosis not present

## 2022-11-23 LAB — CBC WITH DIFFERENTIAL/PLATELET
Abs Immature Granulocytes: 0.01 10*3/uL (ref 0.00–0.07)
Basophils Absolute: 0 10*3/uL (ref 0.0–0.1)
Basophils Relative: 0 %
Eosinophils Absolute: 0.1 10*3/uL (ref 0.0–0.5)
Eosinophils Relative: 1 %
HCT: 46.5 % — ABNORMAL HIGH (ref 36.0–46.0)
Hemoglobin: 14.6 g/dL (ref 12.0–15.0)
Immature Granulocytes: 0 %
Lymphocytes Relative: 23 %
Lymphs Abs: 1.6 10*3/uL (ref 0.7–4.0)
MCH: 26.8 pg (ref 26.0–34.0)
MCHC: 31.4 g/dL (ref 30.0–36.0)
MCV: 85.3 fL (ref 80.0–100.0)
Monocytes Absolute: 0.6 10*3/uL (ref 0.1–1.0)
Monocytes Relative: 8 %
Neutro Abs: 4.8 10*3/uL (ref 1.7–7.7)
Neutrophils Relative %: 68 %
Platelets: 258 10*3/uL (ref 150–400)
RBC: 5.45 MIL/uL — ABNORMAL HIGH (ref 3.87–5.11)
RDW: 13 % (ref 11.5–15.5)
WBC: 7.1 10*3/uL (ref 4.0–10.5)
nRBC: 0 % (ref 0.0–0.2)

## 2022-11-23 LAB — URINALYSIS, ROUTINE W REFLEX MICROSCOPIC
Bacteria, UA: NONE SEEN
Bilirubin Urine: NEGATIVE
Glucose, UA: NEGATIVE mg/dL
Hgb urine dipstick: NEGATIVE
Ketones, ur: NEGATIVE mg/dL
Nitrite: NEGATIVE
Protein, ur: NEGATIVE mg/dL
Specific Gravity, Urine: 1.017 (ref 1.005–1.030)
pH: 7 (ref 5.0–8.0)

## 2022-11-23 LAB — COMPREHENSIVE METABOLIC PANEL
ALT: 19 U/L (ref 0–44)
AST: 30 U/L (ref 15–41)
Albumin: 3.9 g/dL (ref 3.5–5.0)
Alkaline Phosphatase: 74 U/L (ref 38–126)
Anion gap: 10 (ref 5–15)
BUN: 24 mg/dL — ABNORMAL HIGH (ref 6–20)
CO2: 26 mmol/L (ref 22–32)
Calcium: 9.7 mg/dL (ref 8.9–10.3)
Chloride: 104 mmol/L (ref 98–111)
Creatinine, Ser: 0.68 mg/dL (ref 0.44–1.00)
GFR, Estimated: >60 mL/min (ref >60)
Glucose, Bld: 82 mg/dL (ref 70–99)
Potassium: 4.2 mmol/L (ref 3.5–5.1)
Sodium: 140 mmol/L (ref 135–145)
Total Bilirubin: 0.4 mg/dL (ref <1.2)
Total Protein: 7.5 g/dL (ref 6.5–8.1)

## 2022-11-23 LAB — CBG MONITORING, ED: Glucose-Capillary: 79 mg/dL (ref 70–99)

## 2022-11-23 LAB — PREGNANCY, URINE: Preg Test, Ur: NEGATIVE

## 2022-11-23 MED ORDER — MECLIZINE HCL 12.5 MG PO TABS
25.0000 mg | ORAL_TABLET | Freq: Once | ORAL | Status: AC
  Administered 2022-11-23: 25 mg via ORAL
  Filled 2022-11-23: qty 2

## 2022-11-23 MED ORDER — LORAZEPAM 1 MG PO TABS
1.0000 mg | ORAL_TABLET | Freq: Once | ORAL | Status: AC
  Administered 2022-11-23: 1 mg via ORAL
  Filled 2022-11-23: qty 1

## 2022-11-23 MED ORDER — MECLIZINE HCL 12.5 MG PO TABS: 12.5000 mg | ORAL_TABLET | Freq: Three times a day (TID) | ORAL | 0 refills | Status: DC | PRN

## 2022-11-23 NOTE — Discharge Instructions (Addendum)
To the emergency room today for dizziness.  Your workup was overall reassuring.  Your blood work did show elevated BUN, given your report that you did not have much to eat or drink this morning and feel dehydration may have been contributed to your symptoms.  Your urine did show large amount of leukocytes and some white blood cells but appeared to be contaminated specimen, will wait on urine culture but given lack of symptoms, I do not feel you need treatment with antibiotics at this time.  Make sure you drink plenty of fluids, follow-up with your primary care doctor, come back to the ER if you have new or worsening symptoms

## 2022-11-23 NOTE — ED Triage Notes (Signed)
Pt to er, pt states that she [place] in [place] and she was getting some contrast, states that when she bent over to get the contrast she had a sudden onset of dizziness, states that her dizziness is worse when she moves her head.

## 2022-11-23 NOTE — ED Notes (Signed)
Pt ambulatory to and from the bathroom, pt denies pain, pt reports decreased dizziness.

## 2022-11-23 NOTE — ED Notes (Signed)
Pt in bed, sig other at bedside, pt denies pain, pt states that she is feeling better and ready to go home, pt ambulatory from department

## 2022-11-23 NOTE — ED Provider Notes (Signed)
[place] EMERGENCY DEPARTMENT AT [facility] Provider Note   CSN: [id number] Arrival date & time: 11/23/22  4098     History  Chief Complaint  Patient presents with   Dizziness    Meghan [name] is a 52 y.o. female.  She has history of obesity, on Saxenda injections for this.  Presents the ER today complaining of dizziness described as spinning, started suddenly at work today.  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  Denies any numbness tingling or weakness, no chest pain shortness of breath, headache.  No recent trauma.  He does note that he thinks she had drinks more was a few sips of a chai latte and a protein shake with MiraLAX and collagen protein mixed in.  Denies hearing loss, tinnitus or ear pain.  No URI symptoms.  No nausea or vomiting.  Symptoms are better when lying down, when she stands up they are more severe.  No difficulty with speaking, no trouble swallowing, she states she has some mild binocular blurred vision but denies any double vision.   Dizziness      Home Medications Prior to Admission medications   Medication Sig Start Date End Date Taking? Authorizing Provider  omeprazole (PRILOSEC) 20 MG capsule Take 1 capsule (20 mg total) by mouth 2 (two) times daily before a meal. Take 30 min before breakfast and 30 min before dinner 09/23/20 03/22/21  Lanelle Bal, DO  ondansetron (ZOFRAN-ODT) 4 MG disintegrating tablet Take 1 tablet (4 mg total) by mouth every 8 (eight) hours as needed for up to 15 doses for nausea or vomiting. Patient not taking: Reported on 10/04/2022 11/25/21   Terald Sleeper, MD  progesterone (PROMETRIUM) 200 MG capsule 1 at bedtime nightly the first 10 days of each month 10/06/22   Lazaro Arms, MD  Semaglutide, 1 MG/DOSE, (OZEMPIC, 1 MG/DOSE,) 4 MG/3ML SOPN Inject 1 mg into the skin once a week. Patient not taking:  Reported on 10/04/2022    [provider]      Allergies    Oxycodone-acetaminophen, Zanaflex [tizanidine hcl], Morphine, and Oxycodone-acetaminophen    Review of Systems   Review of Systems  Neurological:  Positive for dizziness.    Physical Exam Updated Vital Signs BP 136/67 (BP Location: Left Arm)   Pulse 83   Temp 97.8 F (36.6 C) (Oral)   Resp 17   Ht 5\' 9"  (1.753 m)   Wt 122.5 kg   SpO2 97%   BMI 39.87 kg/m  Physical Exam Vitals and nursing note reviewed.  Constitutional:      General: She is not in acute distress.    Appearance: She is well-developed.  HENT:     Head: Normocephalic and atraumatic.     Mouth/Throat:     Mouth: Mucous membranes are moist.  Eyes:     General: No visual field deficit.    Extraocular Movements: Extraocular movements intact.     Conjunctiva/sclera: Conjunctivae normal.     Pupils: Pupils are equal, round, and reactive to light.  Cardiovascular:     Rate and Rhythm: Normal rate and regular rhythm.     Heart sounds: No murmur heard. Pulmonary:     Effort: Pulmonary effort is normal. No respiratory distress.     Breath sounds: Normal breath sounds.  Abdominal:     Palpations: Abdomen is soft.  Tenderness: There is no abdominal tenderness.  Musculoskeletal:        General: No swelling. Normal range of motion.     Cervical back: Neck supple.  Skin:    General: Skin is warm and dry.     Capillary Refill: Capillary refill takes less than 2 seconds.  Neurological:     General: No focal deficit present.     Mental Status: She is alert and oriented to person, place, and time.     Cranial Nerves: No cranial nerve deficit, dysarthria or facial asymmetry.     Sensory: No sensory deficit.     Motor: No weakness.     Coordination: Coordination is intact. Romberg sign negative. Coordination normal. Finger-Nose-Finger Test and Heel to [facility] Test normal.     Gait: Gait is intact. Gait normal.     Comments: Normal heel to toe  walk   Psychiatric:        Mood and Affect: Mood normal.     ED Results / Procedures / Treatments   Labs (all labs ordered are listed, but only abnormal results are displayed) Labs Reviewed - No data to display  EKG None  Radiology No results found.  Procedures Procedures    Medications Ordered in ED Medications - No data to display  ED Course/ Medical Decision Making/ A&P                                 Medical Decision Making This patient presents to the ED for concern of dizziness described as spinning, this involves an extensive number of treatment options, and is a complaint that carries with it a high risk of complications and morbidity.  The differential diagnosis includes BPPV, labyrinthitis, orthostatic hypotension, cerebellar stroke or cerebellar lesion.   Co morbidities that complicate the patient evaluation :   obesity   Additional history obtained:  Additional history obtained from EMR External records from outside source obtained and reviewed including notes, labs   Lab Tests:  I Ordered, and personally interpreted labs.  The pertinent results include:  mildly elevated BUN, otherwise reassuring.  Patient does have positive leukocyte esterase on urinalysis and 16 white blood cells but also Susan squamous epithelial cells.  No UTI symptoms, sent for culture  Cardiac Monitoring: / EKG:  The patient was maintained on a cardiac monitor.  I personally viewed and interpreted the cardiac monitored which showed an underlying rhythm of: sinus rhythm     Problem List / ED Course / Critical interventions / Medication management   --Patient reporting symptoms of spinning, had very little to eat or drink this morning, symptoms started suddenly, symptoms improved in the ER, gait steady, no neurologic deficits on exam.  Labs show mildly elevated BUN which could indicate some mild dehydration otherwise labs are reassuring.  No anemia, vitals are normal.  Is given  meclizine and Ativan, she states the dizziness is much better, she is somewhat sleepy now for medicines but has been able to ambulate the bathroom several times without difficulty.  Very low suspicion for central vertigo, this could be been peripheral vertigo or even some mild dizziness from likely dehydration due to decreased p.o. intake today.  Discussed p.o. hydration at home.  She was able to drink water here in the ER without difficulty.  Did not give IV fluids due current IV fluid shortage.    As above patient had positive leukocyte esterase and 16 white  blood cells per high-powered field on urinalysis but also had to send squamous epithelial cells per high-power field and she has no dysuria, flank pain, urgency or frequency.  Sent for culture.  Patient informed of result and likely contamination and informed that culture was sent.  I have reviewed the patients home medicines and have made adjustments as needed   Amount and/or Complexity of Data Reviewed Labs: ordered.  Risk Prescription drug management.           Final Clinical Impression(s) / ED Diagnoses Final diagnoses:  None    Rx / DC Orders ED Discharge Orders     None         Josem Kaufmann 11/23/22 1215    Glendora Score, MD 11/23/22 1936

## 2022-11-24 LAB — URINE CULTURE: Culture: NO GROWTH

## 2022-11-25 DIAGNOSIS — Z1329 Encounter for screening for other suspected endocrine disorder: Secondary | ICD-10-CM | POA: Diagnosis not present

## 2022-11-25 DIAGNOSIS — Z131 Encounter for screening for diabetes mellitus: Secondary | ICD-10-CM | POA: Diagnosis not present

## 2022-11-25 DIAGNOSIS — Z13220 Encounter for screening for lipoid disorders: Secondary | ICD-10-CM | POA: Diagnosis not present

## 2022-11-26 ENCOUNTER — Other Ambulatory Visit (HOSPITAL_COMMUNITY): Payer: Self-pay | Admitting: Surgery

## 2022-12-02 DIAGNOSIS — Z01812 Encounter for preprocedural laboratory examination: Secondary | ICD-10-CM | POA: Diagnosis not present

## 2022-12-02 DIAGNOSIS — Z136 Encounter for screening for cardiovascular disorders: Secondary | ICD-10-CM | POA: Diagnosis not present

## 2022-12-02 DIAGNOSIS — K30 Functional dyspepsia: Secondary | ICD-10-CM | POA: Diagnosis not present

## 2022-12-02 DIAGNOSIS — Z131 Encounter for screening for diabetes mellitus: Secondary | ICD-10-CM | POA: Diagnosis not present

## 2022-12-02 DIAGNOSIS — Z13220 Encounter for screening for lipoid disorders: Secondary | ICD-10-CM | POA: Diagnosis not present

## 2022-12-06 ENCOUNTER — Encounter: Payer: Federal, State, Local not specified - PPO | Admitting: Plastic Surgery

## 2022-12-08 ENCOUNTER — Ambulatory Visit (INDEPENDENT_AMBULATORY_CARE_PROVIDER_SITE_OTHER): Payer: Federal, State, Local not specified - PPO | Admitting: Licensed Clinical Social Worker

## 2022-12-08 DIAGNOSIS — F4320 Adjustment disorder, unspecified: Secondary | ICD-10-CM | POA: Diagnosis not present
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
## 2022-12-09 ENCOUNTER — Other Ambulatory Visit (HOSPITAL_COMMUNITY): Payer: Self-pay | Admitting: Surgery

## 2022-12-09 ENCOUNTER — Ambulatory Visit (HOSPITAL_COMMUNITY)
Admission: RE | Admit: 2022-12-09 | Discharge: 2022-12-09 | Disposition: A | Discharge status: Home / Self Care | Payer: Federal, State, Local not specified - PPO | Source: Ambulatory Visit | Attending: Surgery | Admitting: Surgery

## 2022-12-09 ENCOUNTER — Encounter: Payer: Self-pay | Admitting: Obstetrics & Gynecology

## 2022-12-09 DIAGNOSIS — R9389 Abnormal findings on diagnostic imaging of other specified body structures: Secondary | ICD-10-CM | POA: Diagnosis not present

## 2022-12-09 DIAGNOSIS — Z01818 Encounter for other preprocedural examination: Secondary | ICD-10-CM | POA: Diagnosis not present

## 2022-12-10 ENCOUNTER — Other Ambulatory Visit (HOSPITAL_COMMUNITY): Payer: Federal, State, Local not specified - PPO

## 2022-12-10 MED ORDER — PROGESTERONE 200 MG PO CAPS: ORAL_CAPSULE | ORAL | 3 refills | Status: DC

## 2022-12-15 ENCOUNTER — Ambulatory Visit: Payer: Federal, State, Local not specified - PPO | Admitting: Dietician

## 2023-01-07 ENCOUNTER — Encounter: Payer: Self-pay | Admitting: Dietician

## 2023-01-07 ENCOUNTER — Encounter: Payer: Federal, State, Local not specified - PPO | Attending: Surgery | Admitting: Dietician

## 2023-01-07 VITALS — Ht 69.0 in | Wt 282.0 lb

## 2023-01-07 DIAGNOSIS — E669 Obesity, unspecified: Secondary | ICD-10-CM | POA: Diagnosis not present

## 2023-01-07 NOTE — Progress Notes (Signed)
Nutrition Assessment for Bariatric Surgery: Pre-Surgery Behavioral and Nutrition Intervention Program   Medical Nutrition Therapy  Appt Start Time: 9:50    End Time: 11:04  Patient was seen on 01/07/2023 for Pre-Operative Nutrition Assessment. Purpose of todays visit  enhance perioperative outcomes along with a healthy weight maintenance   Referral stated Supervised Weight Loss (SWL) visits needed: 0  Pt completed visits.   Pt has cleared nutrition requirements.   Planned surgery: Sleeve Gastrectomy with hiatal hernia repair Pt expectation of surgery: lose weight for knee pain relief  NUTRITION ASSESSMENT   Anthropometrics  Start weight at [facility]: 282.0 lbs (date: 01/07/2023)  Height: 69 in BMI: 41.64 kg/m2    Clinical   Pharmacotherapy: History of weight loss medication used: GLP-1, oral medications  Medical hx: obesity Medications: progesterone, ondasetron   Labs: WNL Notable signs/symptoms: nothing noted Any previous deficiencies? No  Evaluation of Nutritional Deficiencies: Micronutrient Nutrition Focused Physical Exam: Hair: No issues observed Eyes: No issues observed Mouth: No issues observed Neck: No issues observed Nails: No issues observed Skin: No issues observed  Lifestyle & Dietary Hx  Pt states she has tried everything for weight loss.  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 Pt states when she exercises, it is painful, stating it is both knees. Pt states she has had surgery on one and the other knee needs surgery. Pt states the most she has lost on her own was 50 lbs with phentermine. Pt states she will leave work for lunch, stating she would go to her car and not eat. Pt states she prepares a snack for work in case she gets hungry.  Current Physical Activity Recommendations state 150 minutes per week of moderate to vigorous movement including Cardio and 1-2 days of resistance activities as well as flexibility/balance activities:  Pts current  physical activity: ADLs, with 0% recommendation reached   Sleep Hygiene: duration and quality: terrible, 6-7 hours of sleep, stating not continuous, tossing and turning through the night. Pt states she can not shut down.  Current Patient Perceived Stress Level as stated by pt on a scale of 1-10:  8-9       Stress Management Techniques: leave work for lunch, disconnect from work when off, shopping, or leave the house, massage  According to the Dietary Guidelines for Americans Recommendation: equivalent 1.5-2 cups fruits per day, equivalent 2-3 cups vegetables per day and at least half all grains whole  Fruit servings per day (on average): 1-2, meeting 66-100% recommendation  Non-starchy vegetable servings per day (on average): 0-1, meeting 0-33% recommendation  Whole Grains per day (on average): 1  Number of meals missed/skipped per week out of 21: 7  24-Hr Dietary Recall First Meal: protein shake (protein shake, ice, protein powder, Miralax, cocoa),  or protein bar Snack: granola and yogurt and fruit Second Meal: skip Snack: ritz crackers, peanut butter, protein bar, rice cakes with peanut butter Third Meal: grilled chicken, with sides or pizza or salmon cakes or spaghetti or shrimp with jasmine rice Snack: pillsbury cookies Beverages: water or rarely sweet tea  Alcoholic beverages per week: 0 (once or twice a year)   Estimated Energy Needs Calories: 1500  NUTRITION DIAGNOSIS  Overweight/obesity ([place]-3.3) related to past poor dietary habits and physical inactivity as evidenced by patient w/ planned sleeve with hiatal hernia repair surgery following dietary guidelines for continued weight loss.  NUTRITION INTERVENTION  Nutrition counseling (C-1) and education (E-2) to facilitate bariatric surgery goals.  Educated pt on  micronutrient deficiencies post-surgery and behavioral/dietary strategies to start in order to mitigate that risk   Behavioral and Dietary Interventions Pre-Op Goals  Reviewed with the Patient Nutrition: Healthy Eating Behaviors Switch to non-caloric, non-carbonated and non-caffeinated beverages such as  water, unsweetened tea, Crystal Light and zero calorie beverages (aim for 64 oz. per day) Cut out grazing between meals or at night  Find a protein shake you like Eat every 3-5 hours        Eliminate distractions while eating (TV, computer, reading, driving, texting) Take 81-19 minutes to eat a meal  Decrease high sugar foods/decrease high fat/fried foods Eliminate alcoholic beverages Increase protein intake (eggs, fish, chicken, yogurt) before surgery Eat non starchy vegetables 2 times a day 7 days a week Eat complex carbohydrates such as whole grains and fruits   Behavioral Modification: Physical Activity Increase my usual daily activity (use stairs, park farther, etc.) Engage in _______________________  activity  _______ minutes ______ times per week  Other:    _________________________________________________________________     Problem Solving I will think about my usual eating patterns and how to tweak them How can my friends and family support me Barriers to starting my changes Learn and understand appetite verses hunger   Healthy Coping Allow for ___________ activities per week to help me manage stress Reframe negative thoughts I will keep a picture of someone or something that is my inspiration & look at it daily   Monitoring  Weigh myself once a week  Measure my progress by monitoring how my clothes fit Keep a food record of what I eat and drink for the next ________ (time period) Take pictures of what I eat and drink for the next ________ (time period) Use an app to count steps/day for the next_______ (time period) Measure my progress such as increased energy and more restful sleep Monitor your acid reflux and bowel habits, are they getting better?   *Goals that are bolded indicate the pt would like to start working towards  these  Handouts Provided Include  Bariatric Surgery handouts (Nutrition Visits, Pre Surgery Behavioral Change Goals, Protein Shakes Brands to Choose From, Vitamins & Mineral Supplementation)  Learning Style & Readiness for Change Teaching method utilized: Visual, Auditory, and hands on  Demonstrated degree of understanding via: Teach Back  Readiness Level: preparation Barriers to learning/adherence to lifestyle change: mobility (knee pain)  RD's Notes for Next Visit    MONITORING & EVALUATION Dietary intake, weekly physical activity, body weight, and preoperative behavioral change goals   Next Steps  Pt has completed visits. No further supervised visits required/recommended. Patient is to follow up at [facility] for pre-op class >3 weeks prior to scheduled surgery.

## 2023-01-26 DIAGNOSIS — L814 Other melanin hyperpigmentation: Secondary | ICD-10-CM | POA: Diagnosis not present

## 2023-01-28 ENCOUNTER — Ambulatory Visit: Payer: Federal, State, Local not specified - PPO | Admitting: Podiatry

## 2023-01-31 ENCOUNTER — Encounter: Payer: Self-pay | Admitting: Skilled Nursing Facility1

## 2023-01-31 ENCOUNTER — Encounter: Payer: Federal, State, Local not specified - PPO | Attending: Surgery | Admitting: Skilled Nursing Facility1

## 2023-01-31 VITALS — Wt 287.7 lb

## 2023-01-31 DIAGNOSIS — E669 Obesity, unspecified: Secondary | ICD-10-CM | POA: Insufficient documentation

## 2023-01-31 NOTE — Progress Notes (Signed)
 Pre-Operative Nutrition Class:    Patient was seen on 01/30/2022 for Pre-Operative Bariatric Surgery Education [facility]    Start weight at [facility]: 282.0 lbs (date: 01/07/2023)  Height: 69 in  Weight: 287.7 pounds  Clinical   Pharmacotherapy: History of weight loss medication used: GLP-1, oral medications  Medical hx: obesity Medications: progesterone , ondasetron   Labs: WNL Notable signs/symptoms: nothing noted Any previous deficiencies? No  Samples given per MNT protocol. Patient educated on appropriate usage: [employer] Multivitamin Lot # [id number] Exp: 06/2024   Cele[employer] Vitamins Calcium  Lot # [id number] Exp: 07/2023   Ensure Max Protein Shake Lot # [id number] Exp: 10/19/2023  The following the learning objectives were met by the patient during this course: Identify Pre-Op Dietary Goals and will begin 2 weeks pre-operatively Identify appropriate sources of fluids and proteins  State protein recommendations and appropriate sources pre and post-operatively Identify Post-Operative Dietary Goals and will follow for 2 weeks post-operatively Identify appropriate multivitamin and calcium sources Describe the need for physical activity post-operatively and will follow MD recommendations State when to call healthcare provider regarding medication questions or post-operative complications When having a diagnosis of diabetes understanding hypoglycemia symptoms and the inclusion of 1 complex carbohydrate per meal  Handouts given during class include: Pre-Op Bariatric Surgery Diet Handout Protein Shake Handout Post-Op Bariatric Surgery Nutrition Handout BELT Program Information Flyer Support Group Information Flyer [facility] Bariatric Supplements Price List  Follow-Up Plan: Patient will follow-up at [facility] 2 weeks post operatively for diet advancement per MD.

## 2023-02-04 ENCOUNTER — Ambulatory Visit (HOSPITAL_COMMUNITY): Payer: Self-pay | Admitting: Licensed Clinical Social Worker

## 2023-02-15 ENCOUNTER — Ambulatory Visit (INDEPENDENT_AMBULATORY_CARE_PROVIDER_SITE_OTHER): Payer: Self-pay | Admitting: Plastic Surgery

## 2023-02-15 VITALS — BP 125/81 | HR 83 | Ht 69.0 in

## 2023-02-15 DIAGNOSIS — Z719 Counseling, unspecified: Secondary | ICD-10-CM

## 2023-02-15 NOTE — Progress Notes (Signed)
The patient is a 53 year old female here for follow-up after undergoing laser.  She has noticed a few extra spots coming up on the left cheek in particular.  I think this is a little resistant.  I did like to do another BBL on her face and start her on Cysteamine.  She is in agreement and we will get this set up for her.

## 2023-02-16 ENCOUNTER — Ambulatory Visit: Payer: Self-pay | Admitting: Surgery

## 2023-02-16 DIAGNOSIS — Z01818 Encounter for other preprocedural examination: Secondary | ICD-10-CM

## 2023-02-24 ENCOUNTER — Encounter: Payer: Self-pay | Admitting: Plastic Surgery

## 2023-03-02 NOTE — Patient Instructions (Signed)
DUE TO COVID-19 ONLY TWO VISITORS  (aged 51 and older)  ARE ALLOWED TO COME WITH YOU AND STAY IN THE WAITING ROOM ONLY DURING PRE OP AND PROCEDURE.   **NO VISITORS ARE ALLOWED IN THE SHORT STAY AREA OR RECOVERY ROOM!!**  IF YOU WILL BE ADMITTED INTO THE HOSPITAL YOU ARE ALLOWED ONLY FOUR SUPPORT PEOPLE DURING VISITATION HOURS ONLY (7 AM -8PM)   The support person(s) must pass our screening, gel in and out, and wear a mask at all times, including in the patient's room. Patients must also wear a mask when staff or their support person are in the room. Visitors GUEST BADGE MUST BE WORN VISIBLY  One adult visitor may remain with you overnight and MUST be in the room by 8 P.M.     Your procedure is scheduled on: 03/08/23   Report to [facility] Main Entrance    Report to admitting at : 5:15 AM   Call this number if you have problems the morning of surgery [phone]   Do not eat food :After 6:00 PM the day before surgery.   After 6:00 PM the day before surgery, you may have the following liquids until : 4:30 AM DAY OF SURGERY  Water Black Coffee (sugar ok, NO MILK/CREAM OR CREAMERS)  Tea (sugar ok, NO MILK/CREAM OR CREAMERS) regular and decaf                             Plain Jell-O (NO RED)                                           Fruit ices (not with fruit pulp, NO RED)                                     Popsicles (NO RED)                                                                  Juice: apple, WHITE grape, WHITE cranberry Sports drinks like Gatorade (NO RED)   The day of surgery:  Drink ONE (1) Pre-Surgery Clear G2 at : 4:30 AM the morning of surgery. Drink in one sitting. Do not sip.  This drink was given to you during your hospital  pre-op appointment visit. Nothing else to drink after completing the  Pre-Surgery Clear Ensure or G2.          If you have questions, please contact your surgeon's office.   FOLLOW ANY ADDITIONAL PRE OP INSTRUCTIONS YOU RECEIVED  FROM YOUR SURGEON'S OFFICE!!!    MORNING OF SURGERY DRINK:   DRINK 1 G2 drink BEFORE YOU LEAVE HOME, DRINK ALL OF THE  G2 DRINK AT ONE TIME.   NO SOLID FOOD AFTER 600 PM THE NIGHT BEFORE YOUR SURGERY. YOU MAY DRINK CLEAR FLUIDS. THE G2 DRINK YOU DRINK BEFORE YOU LEAVE HOME WILL BE THE LAST FLUIDS YOU DRINK BEFORE SURGERY.  PAIN IS EXPECTED AFTER SURGERY AND WILL NOT BE COMPLETELY ELIMINATED. AMBULATION AND TYLENOL WILL HELP REDUCE INCISIONAL  AND GAS PAIN. MOVEMENT IS KEY!  YOU ARE EXPECTED TO BE OUT OF BED WITHIN 4 HOURS OF ADMISSION TO YOUR PATIENT ROOM.  SITTING IN THE RECLINER THROUGHOUT THE DAY IS IMPORTANT FOR DRINKING FLUIDS AND MOVING GAS THROUGHOUT THE GI TRACT.  COMPRESSION STOCKINGS SHOULD BE WORN [facility] STAY UNLESS YOU ARE WALKING.   INCENTIVE SPIROMETER SHOULD BE USED EVERY HOUR WHILE AWAKE TO DECREASE POST-OPERATIVE COMPLICATIONS SUCH AS PNEUMONIA.  WHEN DISCHARGED HOME, IT IS IMPORTANT TO CONTINUE TO WALK EVERY HOUR AND USE THE INCENTIVE SPIROMETER EVERY HOUR.   Oral Hygiene is also important to reduce your risk of infection.                                    Remember - BRUSH YOUR TEETH THE MORNING OF SURGERY WITH YOUR REGULAR TOOTHPASTE  DENTURES WILL BE REMOVED PRIOR TO SURGERY PLEASE DO NOT APPLY "Poly grip" OR ADHESIVES!!!   Do NOT smoke after Midnight   Take these medicines the morning of surgery with A SIP OF WATER: NONE.Cetirizine as needed.  DO NOT TAKE ANY ORAL DIABETIC MEDICATIONS DAY OF YOUR SURGERY  Bring CPAP mask and tubing day of surgery.                              You may not have any metal on your body including hair pins, jewelry, and body piercing             Do not wear make-up, lotions, powders, perfumes/cologne, or deodorant  Do not wear nail polish including gel and S&S, artificial/acrylic nails, or any other type of covering on natural nails including finger and toenails. If you have artificial nails, gel coating, etc. that  needs to be removed by a nail salon please have this removed prior to surgery or surgery may need to be canceled/ delayed if the surgeon/ anesthesia feels like they are unable to be safely monitored.   Do not shave  48 hours prior to surgery.    Do not bring valuables to the hospital. [facility] IS NOT             RESPONSIBLE   FOR VALUABLES.   Contacts, glasses, or bridgework may not be worn into surgery.   Bring small overnight bag day of surgery.   DO NOT BRING YOUR HOME MEDICATIONS TO THE HOSPITAL. PHARMACY WILL DISPENSE MEDICATIONS LISTED ON YOUR MEDICATION LIST TO YOU DURING YOUR ADMISSION IN THE HOSPITAL!    Patients discharged on the day of surgery will not be allowed to drive home.  Someone NEEDS to stay with you for the first 24 hours after anesthesia.   Special Instructions: Bring a copy of your healthcare power of attorney and living will documents         the day of surgery if you haven't scanned them before.              Please read over the following fact sheets you were given: IF YOU HAVE QUESTIONS ABOUT YOUR PRE-OP INSTRUCTIONS PLEASE CALL [phone]    [facility] - Preparing for Surgery Before surgery, you can play an important role.  Because skin is not sterile, your skin needs to be as free of germs as possible.  You can reduce the number of germs on your skin by washing with CHG (chlorahexidine gluconate) soap before surgery.  CHG is an antiseptic cleaner which kills germs and bonds with the skin to continue killing germs even after washing. Please DO NOT use if you have an allergy to CHG or antibacterial soaps.  If your skin becomes reddened/irritated stop using the CHG and inform your nurse when you arrive at Short Stay. Do not shave (including legs and underarms) for at least 48 hours prior to the first CHG shower.  You may shave your face/neck. Please follow these instructions carefully:  1.  Shower with CHG Soap the night before surgery and the  morning of  Surgery.  2.  If you choose to wash your hair, wash your hair first as usual with your  normal  shampoo.  3.  After you shampoo, rinse your hair and body thoroughly to remove the  shampoo.                           4.  Use CHG as you would any other liquid soap.  You can apply chg directly  to the skin and wash                       Gently with a scrungie or clean washcloth.  5.  Apply the CHG Soap to your body ONLY FROM THE NECK DOWN.   Do not use on face/ open                           Wound or open sores. Avoid contact with eyes, ears mouth and genitals (private parts).                       Wash face,  Genitals (private parts) with your normal soap.             6.  Wash thoroughly, paying special attention to the area where your surgery  will be performed.  7.  Thoroughly rinse your body with warm water from the neck down.  8.  DO NOT shower/wash with your normal soap after using and rinsing off  the CHG Soap.                9.  Pat yourself dry with a clean towel.            10.  Wear clean pajamas.            11.  Place clean sheets on your bed the night of your first shower and do not  sleep with pets. Day of Surgery : Do not apply any lotions/deodorants the morning of surgery.  Please wear clean clothes to the hospital/surgery center.  FAILURE TO FOLLOW THESE INSTRUCTIONS MAY RESULT IN THE CANCELLATION OF YOUR SURGERY PATIENT SIGNATURE_________________________________  NURSE SIGNATURE__________________________________  ________________________________________________________________________ WHAT IS A BLOOD TRANSFUSION? Blood Transfusion Information  A transfusion is the replacement of blood or some of its parts. Blood is made up of multiple cells which provide different functions. Red blood cells carry oxygen and are used for blood loss replacement. White blood cells fight against infection. Platelets control bleeding. Plasma helps clot blood. Other blood products are available  for specialized needs, such as hemophilia or other clotting disorders. BEFORE THE TRANSFUSION  Who gives blood for transfusions?  Healthy volunteers who are fully evaluated to make sure their blood is safe. This is blood bank blood. Transfusion therapy is the safest it has ever been  in the practice of medicine. Before blood is taken from a donor, a complete history is taken to make sure that person has no history of diseases nor engages in risky social behavior (examples are intravenous drug use or sexual activity with multiple partners). The donor's travel history is screened to minimize risk of transmitting infections, such as malaria. The donated blood is tested for signs of infectious diseases, such as HIV and hepatitis. The blood is then tested to be sure it is compatible with you in order to minimize the chance of a transfusion reaction. If you or a relative donates blood, this is often done in anticipation of surgery and is not appropriate for emergency situations. It takes many days to process the donated blood. RISKS AND COMPLICATIONS Although transfusion therapy is very safe and saves many lives, the main dangers of transfusion include:  Getting an infectious disease. Developing a transfusion reaction. This is an allergic reaction to something in the blood you were given. Every precaution is taken to prevent this. The decision to have a blood transfusion has been considered carefully by your caregiver before blood is given. Blood is not given unless the benefits outweigh the risks. AFTER THE TRANSFUSION Right after receiving a blood transfusion, you will usually feel much better and more energetic. This is especially true if your red blood cells have gotten low (anemic). The transfusion raises the level of the red blood cells which carry oxygen, and this usually causes an energy increase. The nurse administering the transfusion will monitor you carefully for complications. HOME CARE  INSTRUCTIONS  No special instructions are needed after a transfusion. You may find your energy is better. Speak with your caregiver about any limitations on activity for underlying diseases you may have. SEEK MEDICAL CARE IF:  Your condition is not improving after your transfusion. You develop redness or irritation at the intravenous (IV) site. SEEK IMMEDIATE MEDICAL CARE IF:  Any of the following symptoms occur over the next 12 hours: Shaking chills. You have a temperature by mouth above 102 F (38.9 C), not controlled by medicine. Chest, back, or muscle pain. People around you feel you are not acting correctly or are confused. Shortness of breath or difficulty breathing. Dizziness and fainting. You get a rash or develop hives. You have a decrease in urine output. Your urine turns a dark color or changes to pink, red, or brown. Any of the following symptoms occur over the next 10 days: You have a temperature by mouth above 102 F (38.9 C), not controlled by medicine. Shortness of breath. Weakness after normal activity. The white part of the eye turns yellow (jaundice). You have a decrease in the amount of urine or are urinating less often. Your urine turns a dark color or changes to pink, red, or brown. Document Released: 01/02/2000 Document Revised: 03/29/2011 Document Reviewed: 08/21/2007 [facility] Patient Information 2014 [place], [place].  _______________________________________________________________________

## 2023-03-03 ENCOUNTER — Other Ambulatory Visit: Payer: Self-pay

## 2023-03-03 ENCOUNTER — Encounter (HOSPITAL_COMMUNITY): Payer: Self-pay

## 2023-03-03 ENCOUNTER — Encounter (HOSPITAL_COMMUNITY)
Admission: RE | Admit: 2023-03-03 | Discharge: 2023-03-03 | Disposition: A | Discharge status: Home / Self Care | Payer: Federal, State, Local not specified - PPO | Source: Ambulatory Visit | Attending: Surgery | Admitting: Surgery

## 2023-03-03 DIAGNOSIS — Z01812 Encounter for preprocedural laboratory examination: Secondary | ICD-10-CM | POA: Diagnosis not present

## 2023-03-03 DIAGNOSIS — Z01818 Encounter for other preprocedural examination: Secondary | ICD-10-CM

## 2023-03-03 HISTORY — DX: Personal history of urinary calculi: Z87.442

## 2023-03-03 LAB — COMPREHENSIVE METABOLIC PANEL
ALT: 18 U/L (ref 0–44)
AST: 32 U/L (ref 15–41)
Albumin: 3.8 g/dL (ref 3.5–5.0)
Alkaline Phosphatase: 65 U/L (ref 38–126)
Anion gap: 8 (ref 5–15)
BUN: 22 mg/dL — ABNORMAL HIGH (ref 6–20)
CO2: 23 mmol/L (ref 22–32)
Calcium: 9.1 mg/dL (ref 8.9–10.3)
Chloride: 106 mmol/L (ref 98–111)
Creatinine, Ser: 0.42 mg/dL — ABNORMAL LOW (ref 0.44–1.00)
GFR, Estimated: >60 mL/min (ref >60)
Glucose, Bld: 129 mg/dL — ABNORMAL HIGH (ref 70–99)
Potassium: 3.9 mmol/L (ref 3.5–5.1)
Sodium: 137 mmol/L (ref 135–145)
Total Bilirubin: 0.5 mg/dL (ref 0.0–1.2)
Total Protein: 6.9 g/dL (ref 6.5–8.1)

## 2023-03-03 LAB — CBC WITH DIFFERENTIAL/PLATELET
Abs Immature Granulocytes: 0.02 10*3/uL (ref 0.00–0.07)
Basophils Absolute: 0 10*3/uL (ref 0.0–0.1)
Basophils Relative: 0 %
Eosinophils Absolute: 0.1 10*3/uL (ref 0.0–0.5)
Eosinophils Relative: 1 %
HCT: 44.8 % (ref 36.0–46.0)
Hemoglobin: 14 g/dL (ref 12.0–15.0)
Immature Granulocytes: 0 %
Lymphocytes Relative: 27 %
Lymphs Abs: 1.9 10*3/uL (ref 0.7–4.0)
MCH: 27.1 pg (ref 26.0–34.0)
MCHC: 31.3 g/dL (ref 30.0–36.0)
MCV: 86.8 fL (ref 80.0–100.0)
Monocytes Absolute: 0.6 10*3/uL (ref 0.1–1.0)
Monocytes Relative: 8 %
Neutro Abs: 4.4 10*3/uL (ref 1.7–7.7)
Neutrophils Relative %: 64 %
Platelets: 242 10*3/uL (ref 150–400)
RBC: 5.16 MIL/uL — ABNORMAL HIGH (ref 3.87–5.11)
RDW: 13.3 % (ref 11.5–15.5)
WBC: 6.9 10*3/uL (ref 4.0–10.5)
nRBC: 0 % (ref 0.0–0.2)

## 2023-03-03 NOTE — Progress Notes (Addendum)
For Anesthesia: PCP - Benita Stabile, MD  Cardiologist - Antoine Poche, MD   Bowel Prep reminder:  Chest x-ray - 12/19/22 EKG - 11/23/22 Stress Test -  ECHO - 10/30/20 Cardiac Cath -  Pacemaker/ICD device last checked: Pacemaker orders received: Device Rep notified:  Spinal Cord Stimulator:  Sleep Study - Yes CPAP - NO  Fasting Blood Sugar - N/A Checks Blood Sugar _____ times a day Date and result of last Hgb A1c-  Last dose of GLP1 agonist- N/A GLP1 instructions:   Last dose of SGLT-2 inhibitors- N/A SGLT-2 instructions:   Blood Thinner Instructions:N/A Aspirin Instructions: Last Dose:  Activity level: Can go up a flight of stairs and activities of daily living without stopping and without chest pain and/or shortness of breath   Able to exercise without chest pain and/or shortness of breath  Anesthesia review: Hx: PVC,palpitations  Patient denies shortness of breath, fever, cough and chest pain at PAT appointment   Patient verbalized understanding of instructions that were given to them at the PAT appointment. Patient was also instructed that they will need to review over the PAT instructions again at home before surgery.

## 2023-03-07 NOTE — Anesthesia Preprocedure Evaluation (Signed)
Anesthesia Evaluation  Patient identified by MRN, date of birth, ID band Patient awake    Reviewed: Allergy & Precautions, NPO status , Patient's Chart, lab work & pertinent test results  Airway Mallampati: II  TM Distance: >3 FB Neck ROM: Full    Dental  (+) Teeth Intact, Dental Advisory Given   Pulmonary neg pulmonary ROS   Pulmonary exam normal breath sounds clear to auscultation       Cardiovascular Exercise Tolerance: Good negative cardio ROS Normal cardiovascular exam Rhythm:Regular Rate:Normal     Neuro/Psych  Headaches PSYCHIATRIC DISORDERS  Depression       GI/Hepatic Neg liver ROS, hiatal hernia,GERD  ,,  Endo/Other    Class 3 obesity  Renal/GU negative Renal ROS     Musculoskeletal negative musculoskeletal ROS (+)    Abdominal   Peds  Hematology negative hematology ROS (+)   Anesthesia Other Findings   Reproductive/Obstetrics                             Anesthesia Physical Anesthesia Plan  ASA: 3  Anesthesia Plan: General   Post-op Pain Management: Tylenol PO (pre-op)* and Toradol IV (intra-op)*   Induction: Intravenous  PONV Risk Score and Plan: 4 or greater and Midazolam, Dexamethasone, Ondansetron and Scopolamine patch - Pre-op  Airway Management Planned: Oral ETT  Additional Equipment:   Intra-op Plan:   Post-operative Plan: Extubation in OR  Informed Consent: I have reviewed the patients History and Physical, chart, labs and discussed the procedure including the risks, benefits and alternatives for the proposed anesthesia with the patient or authorized representative who has indicated his/her understanding and acceptance.     Dental advisory given  Plan Discussed with: CRNA  Anesthesia Plan Comments: ( )       Anesthesia Quick Evaluation

## 2023-03-08 ENCOUNTER — Encounter (HOSPITAL_COMMUNITY)
Admission: RE | Disposition: A | Discharge status: Home / Self Care | Payer: Self-pay | Source: Home / Self Care | Attending: Surgery

## 2023-03-08 ENCOUNTER — Other Ambulatory Visit: Payer: Self-pay

## 2023-03-08 ENCOUNTER — Inpatient Hospital Stay (HOSPITAL_COMMUNITY): Payer: Self-pay | Admitting: Anesthesiology

## 2023-03-08 ENCOUNTER — Ambulatory Visit (HOSPITAL_COMMUNITY)
Admission: RE | Admit: 2023-03-08 | Discharge: 2023-03-09 | Disposition: A | Discharge status: Home / Self Care | Payer: Federal, State, Local not specified - PPO | Attending: Surgery | Admitting: Surgery

## 2023-03-08 ENCOUNTER — Encounter (HOSPITAL_COMMUNITY): Payer: Self-pay | Admitting: Surgery

## 2023-03-08 ENCOUNTER — Inpatient Hospital Stay (HOSPITAL_COMMUNITY): Payer: Federal, State, Local not specified - PPO | Admitting: Physician Assistant

## 2023-03-08 DIAGNOSIS — Z6841 Body Mass Index (BMI) 40.0 and over, adult: Secondary | ICD-10-CM | POA: Insufficient documentation

## 2023-03-08 DIAGNOSIS — K219 Gastro-esophageal reflux disease without esophagitis: Secondary | ICD-10-CM | POA: Insufficient documentation

## 2023-03-08 DIAGNOSIS — K449 Diaphragmatic hernia without obstruction or gangrene: Secondary | ICD-10-CM | POA: Insufficient documentation

## 2023-03-08 DIAGNOSIS — Z01818 Encounter for other preprocedural examination: Secondary | ICD-10-CM

## 2023-03-08 DIAGNOSIS — E66813 Obesity, class 3: Secondary | ICD-10-CM | POA: Insufficient documentation

## 2023-03-08 DIAGNOSIS — F32A Depression, unspecified: Secondary | ICD-10-CM | POA: Diagnosis not present

## 2023-03-08 HISTORY — PX: HIATAL HERNIA REPAIR: SHX195

## 2023-03-08 HISTORY — PX: UPPER GI ENDOSCOPY: SHX6162

## 2023-03-08 LAB — CBC
HCT: 43.2 % (ref 36.0–46.0)
Hemoglobin: 13.8 g/dL (ref 12.0–15.0)
MCH: 27.4 pg (ref 26.0–34.0)
MCHC: 31.9 g/dL (ref 30.0–36.0)
MCV: 85.9 fL (ref 80.0–100.0)
Platelets: 217 10*3/uL (ref 150–400)
RBC: 5.03 MIL/uL (ref 3.87–5.11)
RDW: 13.4 % (ref 11.5–15.5)
WBC: 11.2 10*3/uL — ABNORMAL HIGH (ref 4.0–10.5)
nRBC: 0 % (ref 0.0–0.2)

## 2023-03-08 LAB — TYPE AND SCREEN
ABO/RH(D): O POS
Antibody Screen: NEGATIVE

## 2023-03-08 LAB — CREATININE, SERUM
Creatinine, Ser: 0.61 mg/dL (ref 0.44–1.00)
GFR, Estimated: >60 mL/min (ref >60)

## 2023-03-08 LAB — ABO/RH: ABO/RH(D): O POS

## 2023-03-08 LAB — POCT PREGNANCY, URINE: Preg Test, Ur: NEGATIVE

## 2023-03-08 IMAGING — MG MM DIGITAL SCREENING BILAT W/ TOMO AND CAD
8 series · 8 of 24 positions shown · non-contrast
Comparison: Previous exam(s).

CLINICAL DATA: Screening.

EXAM:
DIGITAL SCREENING BILATERAL MAMMOGRAM WITH TOMOSYNTHESIS AND CAD
TECHNIQUE: Bilateral screening digital craniocaudal and mediolateral oblique
mammograms were obtained. Bilateral screening digital breast
tomosynthesis was performed. The images were evaluated with
computer-aided detection.

[L CC synth-2D]
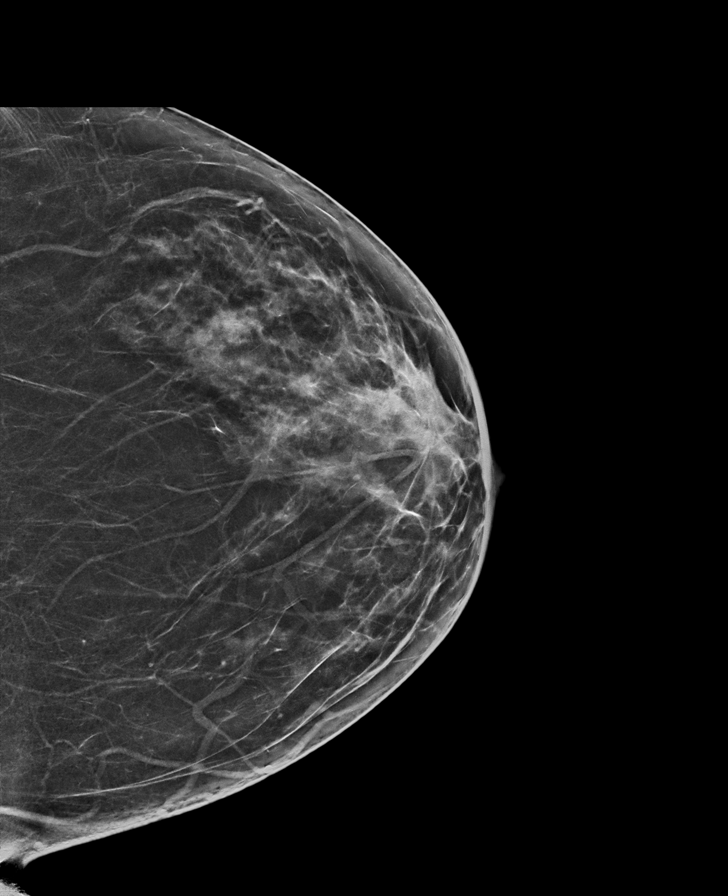

[R CC synth-2D]
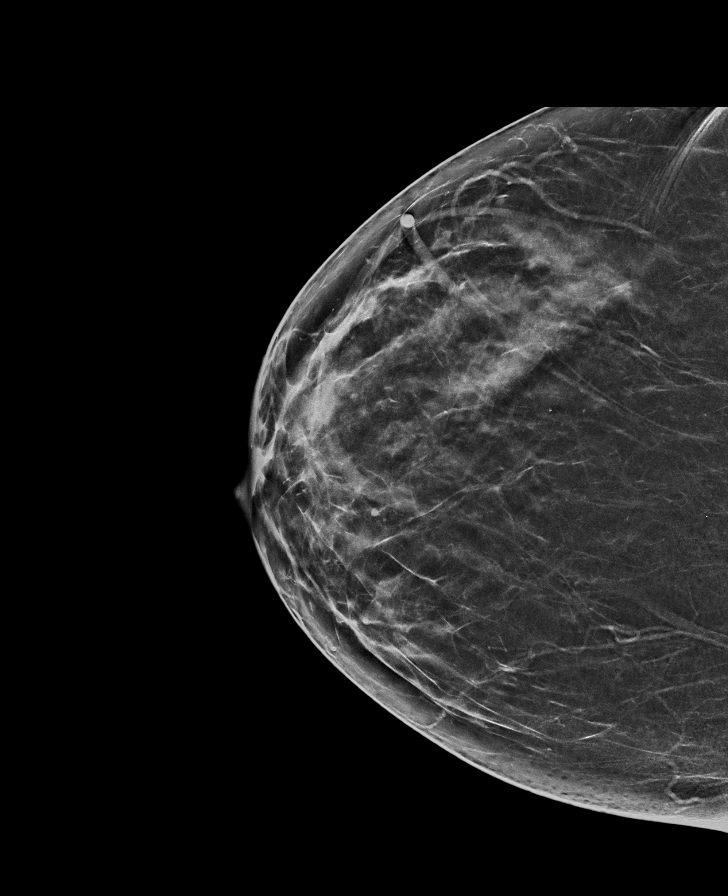

[L MLO synth-2D]
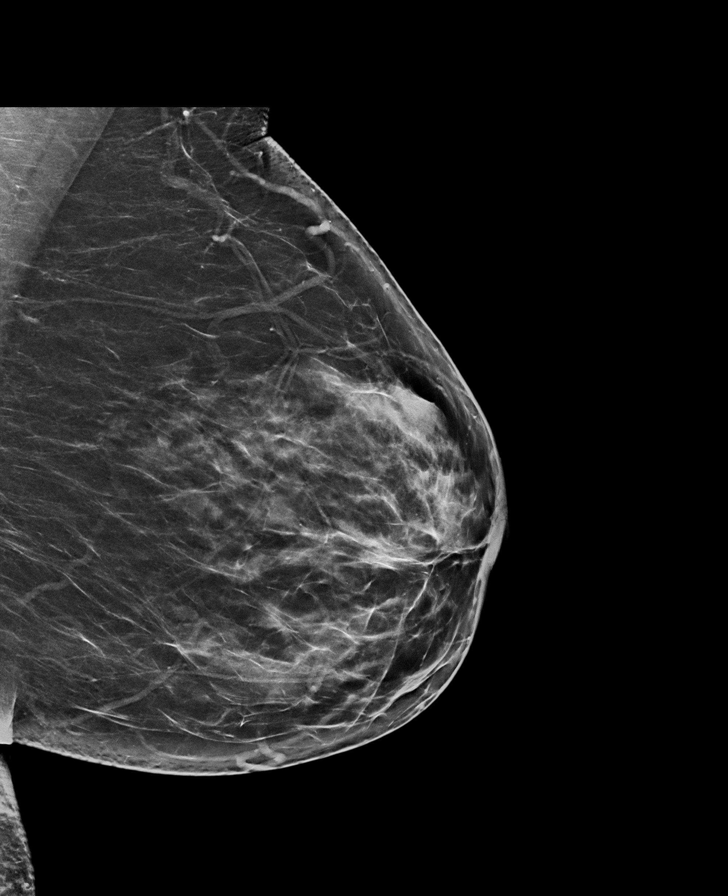

[R MLO synth-2D]
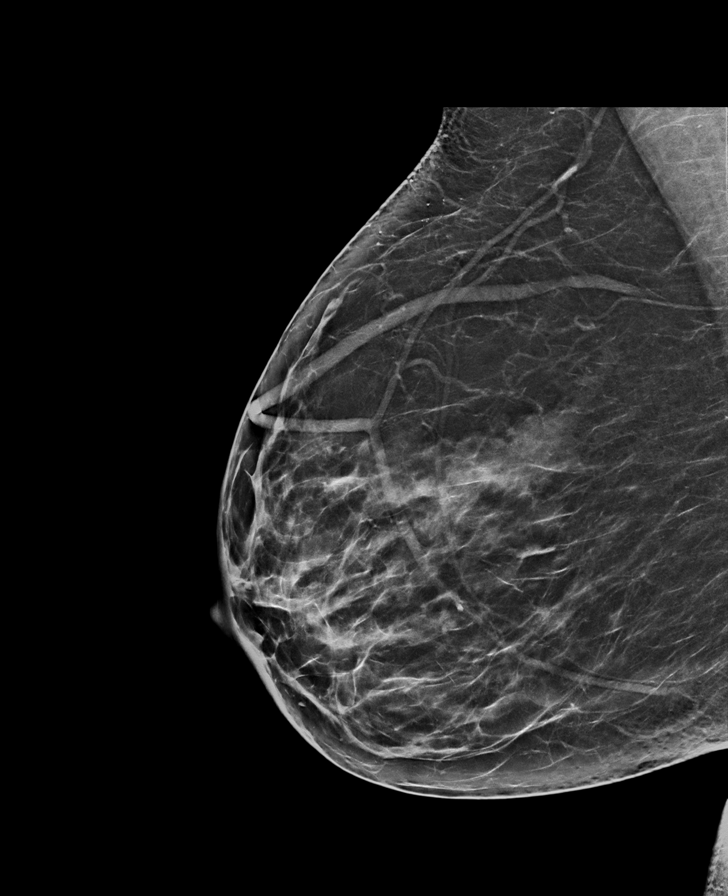

[L MLO tomo · tomo slice 39/77.0]
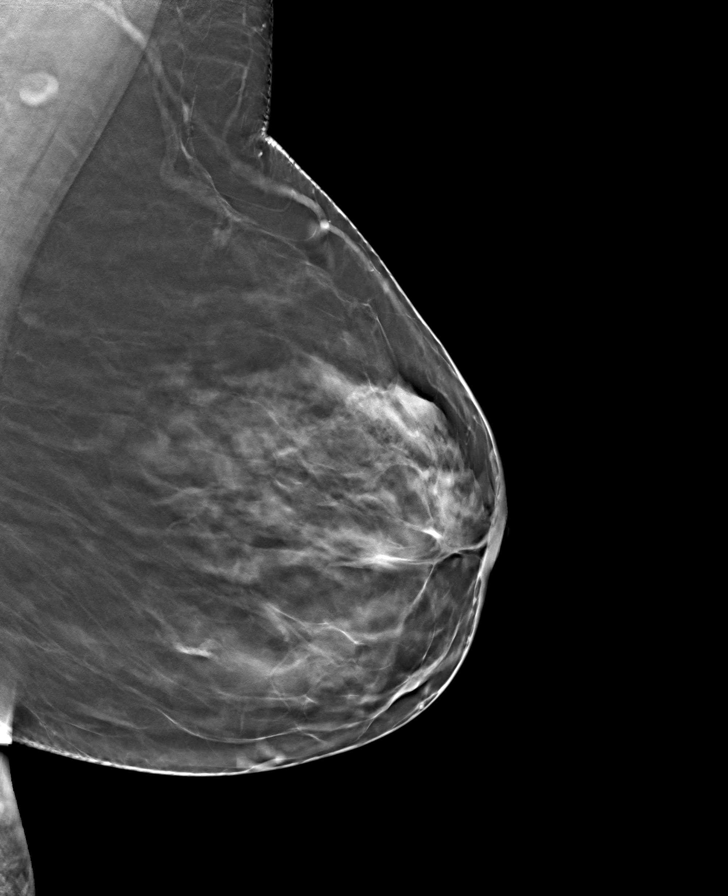

[L CC tomo · tomo slice 36/71.0]
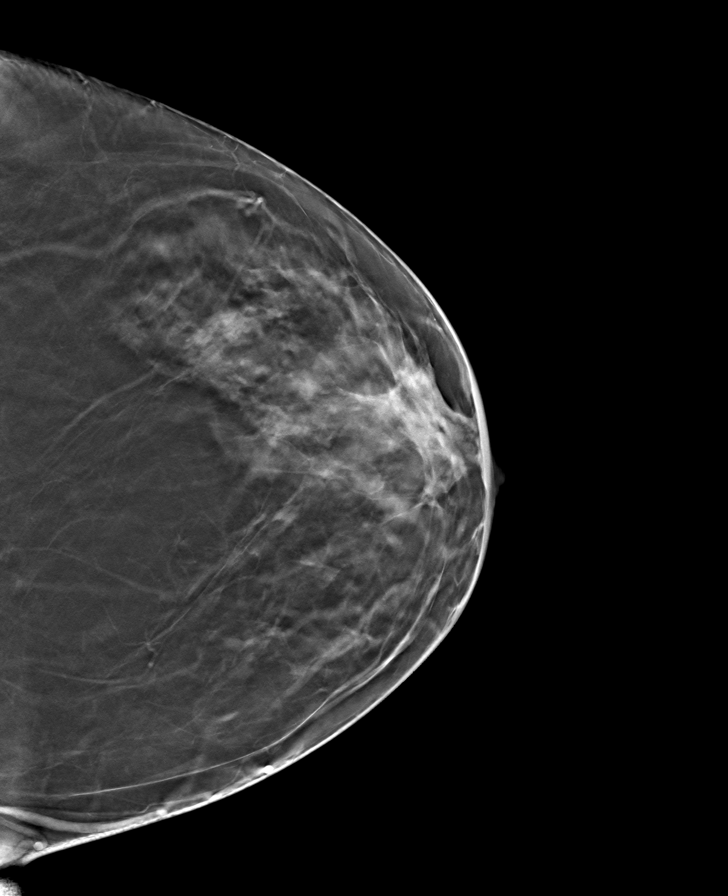

[R MLO tomo · tomo slice 38/75.0]
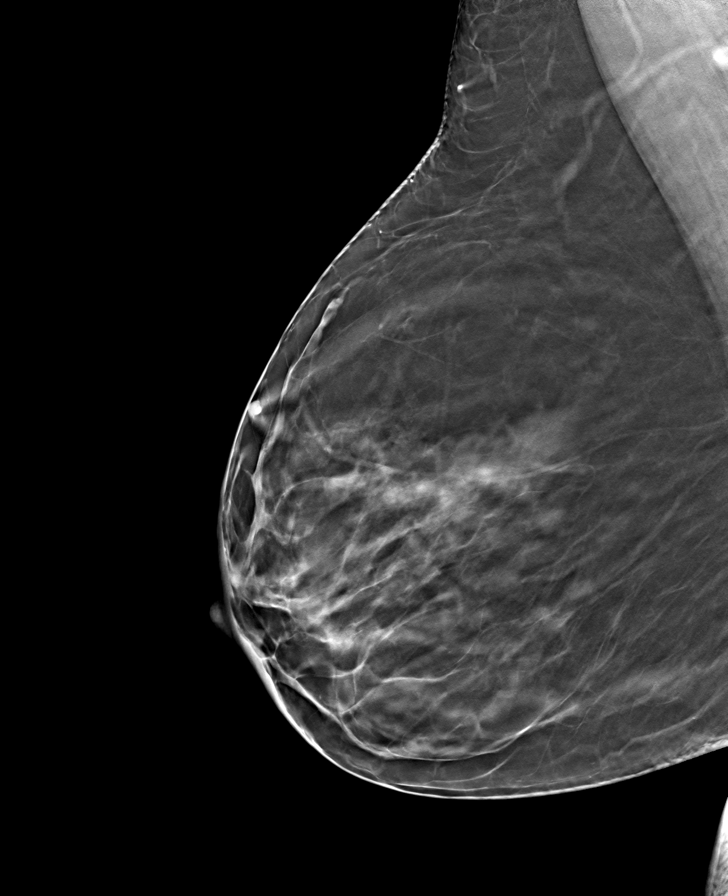

[R CC tomo · tomo slice 35/69.0]
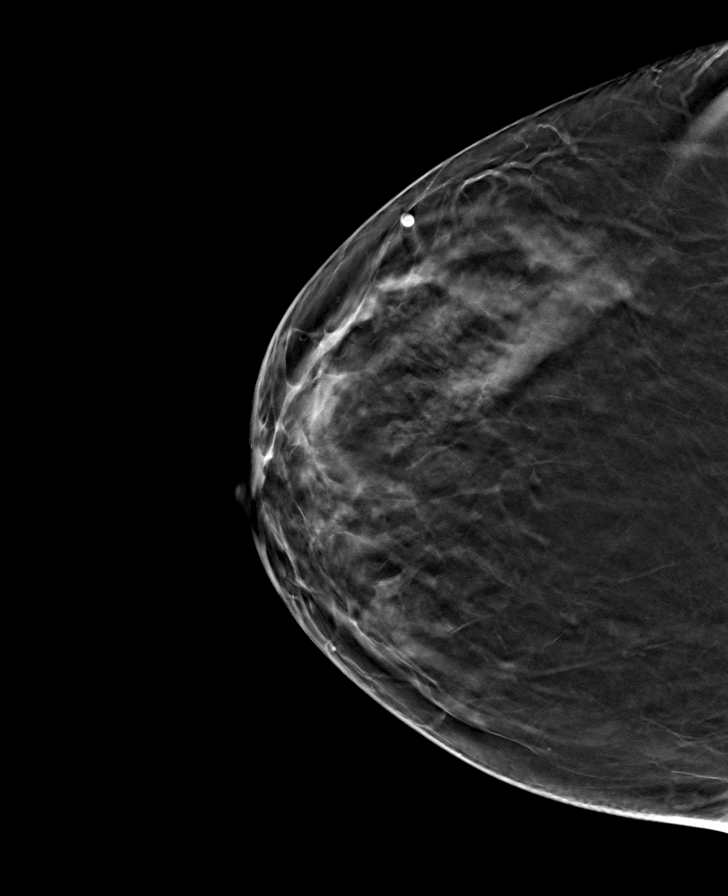

[8 of 24 positions shown; findings below may reference images not displayed]

ACR Breast Density Category b: There are scattered areas of
fibroglandular density.
FINDINGS: There are no findings suspicious for malignancy.
IMPRESSION: No mammographic evidence of malignancy. A result letter of this
screening mammogram will be mailed directly to the patient.

RECOMMENDATION:
Screening mammogram in one year. (Code:51-O-LD2)

BI-RADS CATEGORY  1: Negative.

## 2023-03-08 SURGERY — XI ROBOTIC GASTRIC SLEEVE RESECTION
Anesthesia: General

## 2023-03-08 MED ORDER — ACETAMINOPHEN 160 MG/5ML PO SOLN: 1000.0000 mg | Freq: Three times a day (TID) | ORAL | Status: DC

## 2023-03-08 MED ORDER — PROPOFOL 500 MG/50ML IV EMUL
INTRAVENOUS | Status: AC
  Filled 2023-03-08: qty 50

## 2023-03-08 MED ORDER — CHLORHEXIDINE GLUCONATE CLOTH 2 % EX PADS: 6.0000 | MEDICATED_PAD | Freq: Once | CUTANEOUS | Status: DC

## 2023-03-08 MED ORDER — ROCURONIUM BROMIDE 10 MG/ML (PF) SYRINGE
PREFILLED_SYRINGE | INTRAVENOUS | Status: DC | PRN
  Administered 2023-03-08: 50 mg via INTRAVENOUS
  Administered 2023-03-08 (×2): 20 mg via INTRAVENOUS
  Administered 2023-03-08: 30 mg via INTRAVENOUS

## 2023-03-08 MED ORDER — HEPARIN SODIUM (PORCINE) 5000 UNIT/ML IJ SOLN
5000.0000 [IU] | INTRAMUSCULAR | Status: AC
  Administered 2023-03-08: 5000 [IU] via SUBCUTANEOUS
  Filled 2023-03-08: qty 1

## 2023-03-08 MED ORDER — ONDANSETRON HCL 4 MG/2ML IJ SOLN
INTRAMUSCULAR | Status: DC | PRN
  Administered 2023-03-08: 4 mg via INTRAVENOUS

## 2023-03-08 MED ORDER — DEXAMETHASONE SODIUM PHOSPHATE 10 MG/ML IJ SOLN
INTRAMUSCULAR | Status: AC
Start: 2023-03-08 — End: ?
  Filled 2023-03-08: qty 1

## 2023-03-08 MED ORDER — FENTANYL CITRATE (PF) 100 MCG/2ML IJ SOLN
INTRAMUSCULAR | Status: AC
  Filled 2023-03-08: qty 2

## 2023-03-08 MED ORDER — ROCURONIUM BROMIDE 10 MG/ML (PF) SYRINGE
PREFILLED_SYRINGE | INTRAVENOUS | Status: AC
  Filled 2023-03-08: qty 10

## 2023-03-08 MED ORDER — PHENYLEPHRINE HCL-NACL 20-0.9 MG/250ML-% IV SOLN
INTRAVENOUS | Status: DC | PRN
  Administered 2023-03-08: 20 ug/min via INTRAVENOUS

## 2023-03-08 MED ORDER — DEXAMETHASONE SODIUM PHOSPHATE 4 MG/ML IJ SOLN
INTRAMUSCULAR | Status: DC | PRN
  Administered 2023-03-08: 8 mg via INTRAVENOUS

## 2023-03-08 MED ORDER — MIDAZOLAM HCL 5 MG/5ML IJ SOLN
INTRAMUSCULAR | Status: DC | PRN
  Administered 2023-03-08: 2 mg via INTRAVENOUS

## 2023-03-08 MED ORDER — SCOPOLAMINE 1 MG/3DAYS TD PT72
1.0000 | MEDICATED_PATCH | Freq: Once | TRANSDERMAL | Status: DC
  Administered 2023-03-08: 1.5 mg via TRANSDERMAL
  Filled 2023-03-08: qty 1

## 2023-03-08 MED ORDER — ONDANSETRON HCL 4 MG/2ML IJ SOLN
INTRAMUSCULAR | Status: AC
  Filled 2023-03-08: qty 2

## 2023-03-08 MED ORDER — ACETAMINOPHEN 500 MG PO TABS
1000.0000 mg | ORAL_TABLET | Freq: Three times a day (TID) | ORAL | Status: DC
  Administered 2023-03-08 – 2023-03-09 (×3): 1000 mg via ORAL
  Filled 2023-03-08 (×3): qty 2

## 2023-03-08 MED ORDER — SUGAMMADEX SODIUM 200 MG/2ML IV SOLN
INTRAVENOUS | Status: DC | PRN
  Administered 2023-03-08: 200 mg via INTRAVENOUS

## 2023-03-08 MED ORDER — BUPIVACAINE-EPINEPHRINE 0.25% -1:200000 IJ SOLN
INTRAMUSCULAR | Status: DC | PRN
  Administered 2023-03-08: 60 mL

## 2023-03-08 MED ORDER — ENSURE MAX PROTEIN PO LIQD
2.0000 [oz_av] | ORAL | Status: DC
  Administered 2023-03-09 (×2): 2 [oz_av] via ORAL

## 2023-03-08 MED ORDER — PANTOPRAZOLE SODIUM 40 MG IV SOLR
40.0000 mg | Freq: Every day | INTRAVENOUS | Status: DC
  Administered 2023-03-08: 40 mg via INTRAVENOUS
  Filled 2023-03-08: qty 10

## 2023-03-08 MED ORDER — LACTATED RINGERS IV SOLN: INTRAVENOUS | Status: DC

## 2023-03-08 MED ORDER — BUPIVACAINE-EPINEPHRINE 0.25% -1:200000 IJ SOLN
INTRAMUSCULAR | Status: AC
  Filled 2023-03-08: qty 1

## 2023-03-08 MED ORDER — ONDANSETRON HCL 4 MG/2ML IJ SOLN
4.0000 mg | INTRAMUSCULAR | Status: DC | PRN
  Filled 2023-03-08: qty 2

## 2023-03-08 MED ORDER — FENTANYL CITRATE (PF) 100 MCG/2ML IJ SOLN
INTRAMUSCULAR | Status: DC | PRN
Start: 2023-03-08 — End: 2023-03-08
  Administered 2023-03-08 (×6): 50 ug via INTRAVENOUS

## 2023-03-08 MED ORDER — STERILE WATER FOR IRRIGATION IR SOLN
Status: DC | PRN
  Administered 2023-03-08: 1000 mL

## 2023-03-08 MED ORDER — LIDOCAINE 2% (20 MG/ML) 5 ML SYRINGE
INTRAMUSCULAR | Status: DC | PRN
  Administered 2023-03-08: 100 mg via INTRAVENOUS

## 2023-03-08 MED ORDER — ACETAMINOPHEN 500 MG PO TABS
1000.0000 mg | ORAL_TABLET | ORAL | Status: AC
  Administered 2023-03-08: 1000 mg via ORAL
  Filled 2023-03-08: qty 2

## 2023-03-08 MED ORDER — FENTANYL CITRATE PF 50 MCG/ML IJ SOSY
PREFILLED_SYRINGE | INTRAMUSCULAR | Status: AC
Start: 2023-03-08 — End: 2023-03-08
  Filled 2023-03-08: qty 2

## 2023-03-08 MED ORDER — KETAMINE HCL 10 MG/ML IJ SOLN
INTRAMUSCULAR | Status: DC | PRN
  Administered 2023-03-08: 30 mg via INTRAVENOUS

## 2023-03-08 MED ORDER — EPHEDRINE 5 MG/ML INJ
INTRAVENOUS | Status: AC
Start: 2023-03-08 — End: ?
  Filled 2023-03-08: qty 5

## 2023-03-08 MED ORDER — ORAL CARE MOUTH RINSE: 15.0000 mL | Freq: Once | OROMUCOSAL | Status: AC

## 2023-03-08 MED ORDER — MORPHINE SULFATE (PF) 2 MG/ML IV SOLN
1.0000 mg | INTRAVENOUS | Status: DC | PRN
  Administered 2023-03-08 – 2023-03-09 (×3): 2 mg via INTRAVENOUS
  Filled 2023-03-08 (×4): qty 1

## 2023-03-08 MED ORDER — KETOROLAC TROMETHAMINE 30 MG/ML IJ SOLN
INTRAMUSCULAR | Status: DC | PRN
  Administered 2023-03-08: 30 mg via INTRAVENOUS

## 2023-03-08 MED ORDER — OXYCODONE HCL 5 MG/5ML PO SOLN: 5.0000 mg | Freq: Four times a day (QID) | ORAL | Status: DC | PRN

## 2023-03-08 MED ORDER — MIDAZOLAM HCL 2 MG/2ML IJ SOLN
INTRAMUSCULAR | Status: AC
  Filled 2023-03-08: qty 2

## 2023-03-08 MED ORDER — AMISULPRIDE (ANTIEMETIC) 5 MG/2ML IV SOLN: 10.0000 mg | Freq: Once | INTRAVENOUS | Status: DC | PRN

## 2023-03-08 MED ORDER — CHLORHEXIDINE GLUCONATE 0.12 % MT SOLN
15.0000 mL | Freq: Once | OROMUCOSAL | Status: AC
  Administered 2023-03-08: 15 mL via OROMUCOSAL

## 2023-03-08 MED ORDER — ONDANSETRON HCL 4 MG/2ML IJ SOLN: 4.0000 mg | Freq: Once | INTRAMUSCULAR | Status: DC | PRN

## 2023-03-08 MED ORDER — APREPITANT 40 MG PO CAPS
40.0000 mg | ORAL_CAPSULE | ORAL | Status: AC
  Administered 2023-03-08: 40 mg via ORAL
  Filled 2023-03-08: qty 1

## 2023-03-08 MED ORDER — HYDROCODONE-ACETAMINOPHEN 7.5-325 MG/15ML PO SOLN
10.0000 mL | Freq: Four times a day (QID) | ORAL | Status: DC | PRN
  Administered 2023-03-08 – 2023-03-09 (×2): 10 mL via ORAL
  Filled 2023-03-08 (×4): qty 15

## 2023-03-08 MED ORDER — HEPARIN SODIUM (PORCINE) 5000 UNIT/ML IJ SOLN
5000.0000 [IU] | Freq: Three times a day (TID) | INTRAMUSCULAR | Status: DC
  Administered 2023-03-08 – 2023-03-09 (×3): 5000 [IU] via SUBCUTANEOUS
  Filled 2023-03-08 (×3): qty 1

## 2023-03-08 MED ORDER — LIDOCAINE HCL 2 % IJ SOLN
INTRAMUSCULAR | Status: AC
  Filled 2023-03-08: qty 20

## 2023-03-08 MED ORDER — LACTATED RINGERS IR SOLN
Status: DC | PRN
Start: 2023-03-08 — End: 2023-03-08
  Administered 2023-03-08: 1000 mL

## 2023-03-08 MED ORDER — LIDOCAINE HCL (PF) 2 % IJ SOLN
INTRAMUSCULAR | Status: DC | PRN
  Administered 2023-03-08: 1.5 mg/kg/h via INTRADERMAL

## 2023-03-08 MED ORDER — EPHEDRINE SULFATE-NACL 50-0.9 MG/10ML-% IV SOSY
PREFILLED_SYRINGE | INTRAVENOUS | Status: DC | PRN
  Administered 2023-03-08: 5 mg via INTRAVENOUS

## 2023-03-08 MED ORDER — PHENYLEPHRINE 80 MCG/ML (10ML) SYRINGE FOR IV PUSH (FOR BLOOD PRESSURE SUPPORT)
PREFILLED_SYRINGE | INTRAVENOUS | Status: DC | PRN
  Administered 2023-03-08 (×2): 160 ug via INTRAVENOUS

## 2023-03-08 MED ORDER — KETAMINE HCL 50 MG/5ML IJ SOSY
PREFILLED_SYRINGE | INTRAMUSCULAR | Status: AC
  Filled 2023-03-08: qty 5

## 2023-03-08 MED ORDER — PROPOFOL 10 MG/ML IV BOLUS
INTRAVENOUS | Status: AC
  Filled 2023-03-08: qty 20

## 2023-03-08 MED ORDER — PHENYLEPHRINE 80 MCG/ML (10ML) SYRINGE FOR IV PUSH (FOR BLOOD PRESSURE SUPPORT)
PREFILLED_SYRINGE | INTRAVENOUS | Status: AC
  Filled 2023-03-08: qty 10

## 2023-03-08 MED ORDER — KETOROLAC TROMETHAMINE 30 MG/ML IJ SOLN
INTRAMUSCULAR | Status: AC
  Filled 2023-03-08: qty 1

## 2023-03-08 MED ORDER — SODIUM CHLORIDE 0.9 % IV SOLN
2.0000 g | INTRAVENOUS | Status: AC
  Administered 2023-03-08: 2 g via INTRAVENOUS
  Filled 2023-03-08: qty 2

## 2023-03-08 MED ORDER — PROPOFOL 500 MG/50ML IV EMUL
INTRAVENOUS | Status: DC | PRN
  Administered 2023-03-08: 25 ug/kg/min via INTRAVENOUS

## 2023-03-08 MED ORDER — PROPOFOL 10 MG/ML IV BOLUS
INTRAVENOUS | Status: DC | PRN
  Administered 2023-03-08: 150 mg via INTRAVENOUS

## 2023-03-08 MED ORDER — FENTANYL CITRATE PF 50 MCG/ML IJ SOSY
25.0000 ug | PREFILLED_SYRINGE | INTRAMUSCULAR | Status: DC | PRN
  Administered 2023-03-08: 50 ug via INTRAVENOUS

## 2023-03-08 MED ORDER — SIMETHICONE 80 MG PO CHEW
80.0000 mg | CHEWABLE_TABLET | Freq: Four times a day (QID) | ORAL | Status: DC | PRN
  Administered 2023-03-08 – 2023-03-09 (×3): 80 mg via ORAL
  Filled 2023-03-08 (×3): qty 1

## 2023-03-08 SURGICAL SUPPLY — 59 items
ANTIFOG SOL W/FOAM PAD STRL (MISCELLANEOUS) ×1
APPLIER CLIP ROT 10 11.4 M/L (STAPLE)
BAG COUNTER SPONGE SURGICOUNT (BAG) ×2 IMPLANT
BLADE SURG SZ11 CARB STEEL (BLADE) ×2 IMPLANT
CANNULA REDUCER 12-8 DVNC XI (CANNULA) ×2 IMPLANT
CHLORAPREP W/TINT 26 (MISCELLANEOUS) ×4 IMPLANT
CLIP APPLIE ROT 10 11.4 M/L (STAPLE) IMPLANT
COVER SURGICAL LIGHT HANDLE (MISCELLANEOUS) ×2 IMPLANT
COVER TIP SHEARS 8 DVNC (MISCELLANEOUS) IMPLANT
DERMABOND ADVANCED .7 DNX12 (GAUZE/BANDAGES/DRESSINGS) ×2 IMPLANT
DRAPE ARM DVNC X/XI (DISPOSABLE) ×8 IMPLANT
DRAPE COLUMN DVNC XI (DISPOSABLE) ×2 IMPLANT
DRIVER NDL MEGA SUTCUT DVNCXI (INSTRUMENTS) ×2 IMPLANT
DRIVER NDLE MEGA SUTCUT DVNCXI (INSTRUMENTS) ×1 IMPLANT
ELECT REM PT RETURN 15FT ADLT (MISCELLANEOUS) ×2 IMPLANT
GAUZE 4X4 16PLY ~~LOC~~+RFID DBL (SPONGE) ×2 IMPLANT
GLOVE BIO SURGEON STRL SZ7.5 (GLOVE) ×4 IMPLANT
GLOVE INDICATOR 8.0 STRL GRN (GLOVE) ×4 IMPLANT
GOWN STRL REUS W/ TWL XL LVL3 (GOWN DISPOSABLE) ×4 IMPLANT
GRASPER SUT TROCAR 14GX15 (MISCELLANEOUS) ×2 IMPLANT
GRASPER TIP-UP FEN DVNC XI (INSTRUMENTS) ×2 IMPLANT
HEMOSTAT SNOW SURGICEL 2X4 (HEMOSTASIS) IMPLANT
IRRIG SUCT STRYKERFLOW 2 WTIP (MISCELLANEOUS) ×1
IRRIGATION SUCT STRKRFLW 2 WTP (MISCELLANEOUS) ×2 IMPLANT
KIT BASIN OR (CUSTOM PROCEDURE TRAY) ×2 IMPLANT
KIT TURNOVER KIT A (KITS) IMPLANT
LUBRICANT JELLY K Y 4OZ (MISCELLANEOUS) IMPLANT
MARKER SKIN DUAL TIP RULER LAB (MISCELLANEOUS) IMPLANT
MAT PREVALON FULL STRYKER (MISCELLANEOUS) ×2 IMPLANT
NDL SPNL 18GX3.5 QUINCKE PK (NEEDLE) ×2 IMPLANT
NEEDLE SPNL 18GX3.5 QUINCKE PK (NEEDLE) ×1 IMPLANT
OBTURATOR OPTICAL STND 8 DVNC (TROCAR) ×1
OBTURATOR OPTICALSTD 8 DVNC (TROCAR) ×2 IMPLANT
PACK CARDIOVASCULAR III (CUSTOM PROCEDURE TRAY) ×2 IMPLANT
RELOAD STAPLE 60 2.5 WHT DVNC (STAPLE) IMPLANT
RELOAD STAPLE 60 3.5 BLU DVNC (STAPLE) IMPLANT
SCISSORS LAP 5X35 DISP (ENDOMECHANICALS) IMPLANT
SCISSORS MNPLR CVD DVNC XI (INSTRUMENTS) ×2 IMPLANT
SEAL UNIV 5-12 XI (MISCELLANEOUS) ×8 IMPLANT
SEALER VESSEL EXT DVNC XI (MISCELLANEOUS) ×2 IMPLANT
SET TUBE SMOKE EVAC HIGH FLOW (TUBING) ×2 IMPLANT
SLEEVE GASTRECTOMY 40FR VISIGI (MISCELLANEOUS) IMPLANT
SOL ELECTROSURG ANTI STICK (MISCELLANEOUS) ×1
SOLUTION ANTFG W/FOAM PAD STRL (MISCELLANEOUS) ×2 IMPLANT
SOLUTION ELECTROSURG ANTI STCK (MISCELLANEOUS) ×2 IMPLANT
SPIKE FLUID TRANSFER (MISCELLANEOUS) ×2 IMPLANT
STAPLER 60 SUREFORM DVNC (STAPLE) ×2 IMPLANT
STAPLER RELOAD 2.5X60 WHT DVNC (STAPLE) ×4
STAPLER RELOAD 3.5X60 BLU DVNC (STAPLE) ×1
SUT ETHIBOND 0 36 GRN (SUTURE) IMPLANT
SUT MNCRL AB 4-0 PS2 18 (SUTURE) ×4 IMPLANT
SUT VIC AB 0 CT1 27XBRD ANTBC (SUTURE) ×2 IMPLANT
SUT VIC AB 2-0 SH 27XBRD (SUTURE) ×2 IMPLANT
SUT VICRYL 0 TIES 12 18 (SUTURE) IMPLANT
SYR 20ML LL LF (SYRINGE) ×2 IMPLANT
TOWEL OR 17X26 10 PK STRL BLUE (TOWEL DISPOSABLE) ×2 IMPLANT
TRAY FOLEY MTR SLVR 16FR STAT (SET/KITS/TRAYS/PACK) IMPLANT
TROCAR ADV FIXATION 12X100MM (TROCAR) IMPLANT
TROCAR Z-THREAD FIOS 5X100MM (TROCAR) ×2 IMPLANT

## 2023-03-08 NOTE — Plan of Care (Signed)
   Problem: Education: Goal: Knowledge of General Education information will improve Description: Including pain rating scale, medication(s)/side effects and non-pharmacologic comfort measures Outcome: Progressing   Problem: Coping: Goal: Level of anxiety will decrease Outcome: Progressing

## 2023-03-08 NOTE — Progress Notes (Signed)

## 2023-03-08 NOTE — Anesthesia Procedure Notes (Signed)
Procedure Name: Intubation Date/Time: 03/08/2023 7:38 AM  Performed by: Vanessa [place], CRNAPre-anesthesia Checklist: Patient identified, Emergency Drugs available, Suction available and Patient being monitored Patient Re-evaluated:Patient Re-evaluated prior to induction Oxygen Delivery Method: Circle system utilized Preoxygenation: Pre-oxygenation with 100% oxygen Induction Type: IV induction Ventilation: Mask ventilation without difficulty Laryngoscope Size: 2 and Miller Grade View: Grade I Tube type: Oral Tube size: 7.0 mm Number of attempts: 1 Airway Equipment and Method: Stylet Placement Confirmation: ETT inserted through vocal cords under direct vision, positive ETCO2 and breath sounds checked- equal and bilateral Secured at: 21 cm Tube secured with: Tape Dental Injury: Teeth and Oropharynx as per pre-operative assessment

## 2023-03-08 NOTE — Transfer of Care (Signed)
Immediate Anesthesia Transfer of Care Note  Patient: Meghan [name]  Procedure(s) Performed: ROBOTIC SLEEVE GASTRECTOMY UPPER GI ENDOSCOPY HERNIA REPAIR HIATAL  Patient Location: PACU  Anesthesia Type:General  Level of Consciousness: awake and patient cooperative  Airway & Oxygen Therapy: Patient Spontanous Breathing and Patient connected to face mask  Post-op Assessment: Report given to RN and Post -op Vital signs reviewed and stable  Post vital signs: Reviewed and stable  Last Vitals:  Vitals Value Taken Time  BP 126/96 03/08/23 1006  Temp    Pulse 85 03/08/23 1008  Resp 18 03/08/23 1008  SpO2 99 % 03/08/23 1008  Vitals shown include unfiled device data.  Last Pain:  Vitals:   03/08/23 0544  TempSrc:   PainSc: 0-No pain      Patients Stated Pain Goal: 5 (03/08/23 0544)  Complications: No notable events documented.

## 2023-03-08 NOTE — Op Note (Signed)
Meghan [name] [id number] Feb 21, 1970 03/08/2023  Preoperative diagnosis: severe obesity  Postoperative diagnosis: Same   Procedure: upper endoscopy   Surgeon: Mary Sella. Shekira Drummer M.D., FACS   Anesthesia: Gen.   Indications for procedure: 53 y.o. year old female undergoing  Robotic assisted Gastric Sleeve Resection and an EGD was requested to evaluate the new gastric sleeve.   Description of procedure: After we have completed the sleeve resection, I obtained the Olympus endoscope. I gently placed endoscope in the patient's oropharynx and gently glided it down the esophagus without any difficulty under direct visualization. Once I was in the gastric sleeve, I insufflated the stomach with air. I was able to cannulate and advanced the scope through the gastric sleeve. I was able to cannulate the duodenum with ease.  GE junction located at 38 cm.  Upon further inspection of the gastric sleeve, the mucosa appeared normal. There is no evidence of any mucosal abnormality. The sleeve was widely patent at the angularis. There was no evidence of bleeding. The gastric sleeve was decompressed. The scope was withdrawn. The patient tolerated this portion of the procedure well. Please see Dr Stechschulte's operative note for details regarding the robotic gastric sleeve resection.   Mary Sella. Andrey Campanile, MD, FACS  General, Bariatric, & Minimally Invasive Surgery  [facility], [place]

## 2023-03-08 NOTE — H&P (Signed)
Admitting Physician: Hyman Hopes Lawarence Meek  Service: Bariatric surgery  CC: Obesity  Subjective   HPI: Meghan [name] is an 53 y.o. female who is here for robotic sleeve gastrectomy with hiatal hernia repair and upper endoscopy  Past Medical History:  Diagnosis Date   Abdominal pain    RLQ   Back pain    Chest pain, unspecified    Colitis    see psh for colonoscopy reports   Constipation    Dyspnea    Endometrial polyp 11/09/2012   Enlarged lymph node    right side - arm   Family history of ovarian cancer    MGM   Generalized headaches    GERD (gastroesophageal reflux disease)    Hemorrhoids    Hiatal hernia    Hip pain    History of kidney stones    IBS (irritable bowel syndrome)    IBS (irritable bowel syndrome)    Insomnia    Irregular bleeding 01/01/2013   Menorrhagia 10/17/2012   Migraines    Nausea    Obesity, unspecified    Palpitations    Pneumonia 2006   PVC (premature ventricular contraction)    Vitamin D deficiency     Past Surgical History:  Procedure Laterality Date   ABLATION     Uterine   BALLOON DILATION N/A 09/23/2020   Procedure: BALLOON DILATION;  Surgeon: Lanelle Bal, DO;  [facility];  Service: Endoscopy;  Laterality: N/A;   BIOPSY  09/23/2020   Procedure: BIOPSY;  Surgeon: Lanelle Bal, DO;  Location[facility];  Service: Endoscopy;;   COLONOSCOPY  2005   Dr. Elpidio Anis. Colitis of cecum and ICV, bx nonspecific   COLONOSCOPY N/A 02/10/2015   Minimal internal hemorrhoids, otherwise normal-appearing rectal mucosa. Normal-appearing colonic mucosa. Distal 10 cm of TI also appeared normal. Segmental biopsies taken benign.    colonoscopy with ileoscopy  2008   Dr. Karilyn Cota. patch of coarse mucosa at TI, bx  neg.  Random colon bx showed minimal active focal colitis ?resolving infection. Felt not c/w IBD.   COLONOSCOPY WITH PROPOFOL N/A 09/23/2020   Nonbleeding internal hemorrhoids, 2 mm tubular adenoma removed  from the transverse colon, random colon biopsies negative.  Next colonoscopy in 5 years.   DILITATION & CURRETTAGE/HYSTROSCOPY WITH THERMACHOICE ABLATION N/A 12/01/2012   Procedure: DILATATION & CURETTAGE;REMOVAL OF ENDOMETRIAL POLYP; HYSTEROSCOPY WITH THERMACHOICE ENDOMETRIAL ABLATION;  Surgeon: [name], MD;  Location: [facility];  Service: Gynecology;  Laterality: N/A;   ESOPHAGOGASTRODUODENOSCOPY  02/2008   Dr. Jena Gauss- birnak appearing tubular esophagus. small hiatal hernia, o/w normal stomach, D1, D2   ESOPHAGOGASTRODUODENOSCOPY (EGD) WITH PROPOFOL N/A 09/23/2020   Small hiatal hernia, gastritis with unremarkable biopsies.   KIDNEY STONE SURGERY     KNEE ARTHROSCOPY  2008   ACL repair   LAPAROSCOPIC BILATERAL SALPINGECTOMY Bilateral 12/01/2012   Procedure: LAPAROSCOPIC BILATERAL SALPINGECTOMY ;  Surgeon: [name], MD;  Location: [facility];  Service: Gynecology;  Laterality: Bilateral;   LESION REMOVAL Left 12/01/2012   Procedure: SKIN TAG REMOVAL (Left inner thigh) ;  Surgeon: [name], MD;  Location: [facility];  Service: Gynecology;  Laterality: Left;   POLYPECTOMY  09/23/2020   Procedure: POLYPECTOMY;  Surgeon: Lanelle Bal, DO;  [facility];  Service: Endoscopy;;    Family History  Problem Relation Age of Onset   Scleroderma Mother    Sleep apnea Mother    Obesity Mother    Hypertension  Father    Cancer Father        liver   Cirrhosis Father        non-alcoholic   Obesity Father    Other Sister        Transverse myelitis   Cancer Maternal Grandmother        ovarian cancer   Diabetes Maternal Grandfather    Stroke Paternal Grandmother    Hypertension Paternal Grandmother    Colon cancer Neg Hx    Colon polyps Neg Hx     Social:  reports that she has never smoked. She has been exposed to tobacco smoke. She has never used smokeless tobacco. She reports current alcohol use. She reports that she does not use drugs.  Allergies:  Allergies   Allergen Reactions   Oxycodone-Acetaminophen Hives and Itching   Zanaflex [Tizanidine Hcl] Hives   Morphine Other (See Comments)    Neck pain    Medications: Current Outpatient Medications  Medication Instructions   cetirizine (ZYRTEC) 10 mg, Daily PRN    ROS - all of the below systems have been reviewed with the patient and positives are indicated with bold text General: chills, fever or night sweats Eyes: blurry vision or double vision ENT: epistaxis or sore throat Allergy/Immunology: itchy/watery eyes or nasal congestion Hematologic/Lymphatic: bleeding problems, blood clots or swollen lymph nodes Endocrine: temperature intolerance or unexpected weight changes Breast: new or changing breast lumps or nipple discharge Resp: cough, shortness of breath, or wheezing CV: chest pain or dyspnea on exertion GI: as per HPI GU: dysuria, trouble voiding, or hematuria MSK: joint pain or joint stiffness Neuro: TIA or stroke symptoms Derm: pruritus and skin lesion changes Psych: anxiety and depression  Objective   PE Blood pressure (!) 148/90, pulse 70, temperature 98 F (36.7 C), temperature source Oral, resp. rate 18, height 5\' 9"  (1.753 m), weight 129.8 kg, SpO2 97%. Constitutional: NAD; conversant; no deformities Eyes: Moist conjunctiva; no lid lag; anicteric; PERRL Neck: Trachea midline; no thyromegaly Lungs: Normal respiratory effort; no tactile fremitus CV: RRR; no palpable thrills; no pitting edema GI: Abd soft, nontender; no palpable hepatosplenomegaly MSK: Normal range of motion of extremities; no clubbing/cyanosis Psychiatric: Appropriate affect; alert and oriented x3 Lymphatic: No palpable cervical or axillary lymphadenopathy  Results for orders placed or performed during the hospital encounter of 03/08/23 (from the past 24 hours)  Pregnancy, urine POC     Status: None   Collection Time: 03/08/23  5:41 AM  Result Value Ref Range   Preg Test, Ur NEGATIVE NEGATIVE     Imaging Orders  No imaging studies ordered today     Assessment and Plan   Meghan [name] is a 53 y.o. female who is seen for bariatric surgery consultation. The patient has morbid obesity with a BMI of Body mass index is 40.43 kg/m. and the following conditions related to obesity: GERD, Hiatal Hernia.  Today we discussed the surgical options to treat obesity and its associated comorbidity. After discussing the available procedures in the region, we discussed in great detail the surgeries I offer: robotic sleeve gastrectomy and robotic roux-en-y gastric bypass. We discussed the procedures themselves as well as their risks, benefits and alternatives. I entered the patient's basic information into the [facility] Metabolic Surgery Risk/Benefit Calculator to facilitate this discussion.   I explained the risk of postoperative acid reflux problems with a sleeve gastrectomy, and how patients with hiatal hernias give me additional concerned that acid reflux problems may be symptomatic despite fixing the hiatal  hernia at the time of sleeve gastrectomy. She voiced understanding, but is more interested in the lower risk surgical option at this time and would like to proceed with sleeve gastrectomy.   After a full discussion and all questions answered, the patient is interested in pursuing a robotic sleeve gastrectomy with upper endoscopy and hiatal hernia repair.  She completed the bariatric surgery preoperative pathway to include the following: - Bloodwork - Dietician consult - Chest x-ray - EKG - Psychology evaluation - Upper endoscopy with biopsy.  Reviewed the patient's recent colonoscopy and upper endoscopy performed by Dr. Marletta Lor. This is where the hiatal hernia was noted. I reviewed the patient's echocardiogram and recent EKG. I reviewed the patient's recent lab work performed at [facility] emergency department.  Today she presents for robotic gastric bypass with hiatal hernia repair.  We  again discussed the procedure, its risks, benefits and alternatives and the patient granted consent to proceed.  We will proceed as scheduled.    Quentin Ore, MD  [facility] Use [email or web address] to contact on call provider

## 2023-03-08 NOTE — Op Note (Signed)
Patient: Meghan [name] (22-May-1970, [id number])  Date of Surgery: 03/08/2023  Preoperative Diagnosis: Morbid obesity   Postoperative Diagnosis: Morbid obesity   Surgical Procedure: ROBOTIC SLEEVE GASTRECTOMY: 43775 (CPT) UPPER GI ENDOSCOPY: [id number] HERNIA REPAIR HIATAL:    Operative Team Members:  Surgeons and Role:    * Leeta Grimme, Hyman Hopes, MD - Primary    Gaynelle Adu, MD - Assisting   Anesthesiologist: Collene Schlichter, MD CRNA: Vanessa Bazine, CRNA; Nelle Don, CRNA   Anesthesia: General   Fluids:  Total I/O In: 100 [IV Piggyback:100] Out: 25 [Blood:25]  Complications: None  Drains:  none   Specimen:  ID Type Source Tests Collected by Time Destination  1 : stomach Tissue PATH GI Other SURGICAL PATHOLOGY Cathy Ropp, Hyman Hopes, MD 03/08/2023 [phone]      Disposition:  PACU - hemodynamically stable.  Plan of Care: Admit to inpatient     Indications for Procedure: Meghan [name] is a 53 y.o. female who is seen for bariatric surgery consultation. The patient has morbid obesity with a BMI of Body mass index is 40.43 kg/m. and the following conditions related to obesity: GERD, Hiatal Hernia.  Today we discussed the surgical options to treat obesity and its associated comorbidity. After discussing the available procedures in the region, we discussed in great detail the surgeries I offer: robotic sleeve gastrectomy and robotic roux-en-y gastric bypass. We discussed the procedures themselves as well as their risks, benefits and alternatives. I entered the patient's basic information into the [facility] Metabolic Surgery Risk/Benefit Calculator to facilitate this discussion.   I explained the risk of postoperative acid reflux problems with a sleeve gastrectomy, and how patients with hiatal hernias give me additional concerned that acid reflux problems may be symptomatic despite fixing the hiatal hernia at the time of sleeve gastrectomy. She voiced  understanding, but is more interested in the lower risk surgical option at this time and would like to proceed with sleeve gastrectomy.   After a full discussion and all questions answered, the patient is interested in pursuing a robotic sleeve gastrectomy with upper endoscopy and hiatal hernia repair.  She completed the bariatric surgery preoperative pathway to include the following: - Bloodwork - Dietician consult - Chest x-ray - EKG - Psychology evaluation - Upper endoscopy with biopsy.   Reviewed the patient's recent colonoscopy and upper endoscopy performed by Dr. Marletta Lor. This is where the hiatal hernia was noted. I reviewed the patient's echocardiogram and recent EKG. I reviewed the patient's recent lab work performed at [facility] emergency department.   Today she presents for robotic gastric bypass with hiatal hernia repair.  We again discussed the procedure, its risks, benefits and alternatives and the patient granted consent to proceed.  We will proceed as scheduled.   Findings: Small posterior hiatal hernia sac  Infection status: Patient: Private Patient Elective Case Case: Elective Infection Present At Time Of Surgery (PATOS): None   Description of Procedure:   On the date stated above, the patient was taken to the operating room suite and placed in supine positioning.  General endotracheal anesthesia was induced.  A timeout was completed verifying the correct patient, procedure, positioning and equipment needed for the case.  The patient's abdomen was prepped and draped in the usual sterile fashion.  I entered the patient's right upper quadrant using a 5 mm trocar in the optical technique.  There was no trauma to underlying viscera with initial trocar placement.  The abdomen was insufflated 15 mmHg.  A  total of 4 robotic trochars were placed across the mid abdomen, including the 5 mm initial trocar being upsized to a 8 mm trocar.  The robotic stapler trocar was placed in the  number two position.  The [facility] liver retractor was placed through the subxiphoid region and under the left lobe of the liver and was connected to the rail of the bed.  A TAP block was placed using marcaine under direct vision of the laparoscope.  The [employer] XI robotic platform was docked and we transitioned to robotic surgery.   Using the tip up fenestrated grasper, fenestrated bipolar, 30 degree camera and Vessel Sealer from the patient's right to left, we began by dissecting the angle of His off the left crus of the diaphragm.  The adhesions between the stomach, spleen and diaphragm were divided using the Vessel Sealer to define the angle of His.    During this portion of the dissection, a hiatal hernia was identified and repaired.    The gastrohepatic ligament was divided and the right crus was identified.  A blunt dissection was carried out between the right crus and the esophagus.  The phrenoesophageal ligament was divided and dissection was carried up over the top of the esophagus towards the left crus.  The fundus was partially mobilized off of the left hemidiaphragm.    All posterior gastric attachments to the lesser sac and retroperitoneum were divided.  The left crus was further delineated.  A retroesophageal window was created and used to for esophageal retraction.    A high, circumferential mediastinal dissection was performed in an effort to mobilize the esophagus and provide for adequate intraabdominal esophageal length.  The mediastinal dissection was performed bluntly, with little to no thermal energy.  The anterior and posterior vagus nerves were both identified and preserved.  The crural defect was reapproximated with multiple, interrupted, 0 Ethibond sutures.  The crural pillars came together well without tearing of the adjacent diaphragmatic tissue.    I then started 6 cm away from the pylorus along the greater curve the stomach and divided the gastroepiploic vessels and the  gastrocolic ligament.  The lesser sac was entered.  There were many adhesions to the posterior wall of the stomach which were divided.  The greater curve was mobilized working superiorly toward the spleen.  All of the gastroepiploic and short gastric vessels were divided as we divided the gastrocolic and gastrosplenic ligaments.  As we reached the splenic hilum, I lifted the stomach anteriorly.  I created a tunnel between the stomach and its attachments to the retroperitoneum posteriorly just to the left of the GE junction until I encountered the left crus and my previous angle of His dissection.  We then were able to approach the shortest of the short gastrics both from the greater curve the stomach laterally and from the left crus medially.  These were divided using the Vessel Sealer and the fundus of the stomach was fully mobilized.  With the stomach fully mobilized we direct our attention to stapling.  A 40 French VISI G was inserted into the stomach and positioned along the lesser curve the stomach and suction was applied.  The 60 mm robotic sureform linear stapler was used to create the sleeve gastrectomy.  We started 6 cm from the pylorus and were careful to avoid narrowing at the incisura.  We stayed about 1 cm away from the GE junction to protect the sling fibers.  We used one blue and four white loads  of the linear stapler.  With the sleeve gastrectomy completed the VISI G was taken off suction and removed and we performed an upper endoscopy.  The intra-abdominal pressure was decreased to to check hemostasis.  The foregut was submerged in saline irrigation and the adult upper endoscope was inserted into the stomach as far as the pylorus to inspect the sleeve.  The sleeve appeared appropriately oriented without any twisting.  There was good hemostasis.  The sleeve was widely patent at the incisura with no narrowing.  There was no significant retained fundus.  The sleeve was inflated with the endoscope  and there was no bubbling of the irrigation, suggesting a negative leak test and a airtight sleeve gastrectomy.  The foregut was decompressed with the endoscope and the endoscope was removed.  An omentopexy was performed, using running 2-0 vicryl suture to connect the inferior two staple lines to the divided omentum. There was good hemostasis at the end of the case.  The robot was undocked and moved away from the field.  The sleeve gastrectomy specimen was removed from the stapler port.  The fascia of the stapler port was closed using a 0 Vicryl on a PMI suture passer.   The liver retractor was removed under direct vision.  The pneumoperitoneum was evacuated.  The skin was closed using 4-0 Monocryl and Dermabond.  All sponge and needle counts were correct at the end of the case.    Ivar Drape, MD General, Bariatric, & Minimally Invasive Surgery [facility], [place]

## 2023-03-08 NOTE — Discharge Instructions (Signed)

## 2023-03-08 NOTE — Progress Notes (Addendum)
PHARMACY CONSULT FOR:  Risk Assessment for Post-Discharge VTE Following Bariatric Surgery  Procedure* Robotic sleeve gastrectomy  Sex F  Black race N  Age (years) 53  BMI (kg/m2) 42.26  Operation duration (minutes) 1hr  History of VTE requiring treatment* N  Hypercoagulable condition* N  Liver disorder* N  Pre-op venous stasis N  Pre-op functional health status Independent  Previous foregut or bariatric surg N  Post-op surgical site infection N  Transfusion intra- or post-op* N  Unplanned readmission N  Unplanned reoperation N  GI perforation/leak/obstruction* N  *specific risk factors for portomesenteric venous thrombosis   Predicted probability of 30-day post-discharge VTE: 0.32% (mild risk) estimated using the [facility] Calculator  Other patient-specific factors to consider: N/A  Recommendation for Discharge: No pharmacologic prophylaxis post-discharge   NICIE MILAN is a 53 y.o. female who underwent  a robotic sleeve gastrectomy on 2/18   Case start: 0800 Case end: 0953   Allergies  Allergen Reactions   Oxycodone-Acetaminophen Hives and Itching   Zanaflex [Tizanidine Hcl] Hives   Morphine Other (See Comments)    Neck pain    Patient Measurements: Height: 5\' 9"  (175.3 cm) Weight: 129.8 kg (286 lb 3.2 oz) IBW/kg (Calculated) : 66.2 Body mass index is 42.26 kg/m.  No results for input(s): "WBC", "HGB", "HCT", "PLT", "APTT", "CREATININE", "LABCREA", "CREAT24HRUR", "MG", "PHOS", "ALBUMIN", "PROT", "AST", "ALT", "ALKPHOS", "BILITOT", "BILIDIR", "IBILI" in the last 72 hours. Estimated Creatinine Clearance: 121.7 mL/min (A) (by C-G formula based on SCr of 0.42 mg/dL (L)).   Past Medical History:  Diagnosis Date   Abdominal pain    RLQ   Back pain    Chest pain, unspecified    Colitis    see psh for colonoscopy reports   Constipation    Dyspnea    Endometrial polyp 11/09/2012   Enlarged lymph node    right side -  arm   Family history of ovarian cancer    MGM   Generalized headaches    GERD (gastroesophageal reflux disease)    Hemorrhoids    Hiatal hernia    Hip pain    History of kidney stones    IBS (irritable bowel syndrome)    IBS (irritable bowel syndrome)    Insomnia    Irregular bleeding 01/01/2013   Menorrhagia 10/17/2012   Migraines    Nausea    Obesity, unspecified    Palpitations    Pneumonia 2006   PVC (premature ventricular contraction)    Vitamin D deficiency     Medications Prior to Admission  Medication Sig Dispense Refill Last Dose/Taking   cetirizine (ZYRTEC) 10 MG tablet Take 10 mg by mouth daily as needed for allergies.   03/07/2023 Evening     Cherylin Mylar, PharmD Clinical Pharmacist  2/18/202512:13 PM

## 2023-03-08 NOTE — Anesthesia Postprocedure Evaluation (Signed)
Anesthesia Post Note  Patient: Meghan [name]  Procedure(s) Performed: ROBOTIC SLEEVE GASTRECTOMY UPPER GI ENDOSCOPY HERNIA REPAIR HIATAL     Patient location during evaluation: PACU Anesthesia Type: General Level of consciousness: awake and alert Pain management: pain level controlled Vital Signs Assessment: post-procedure vital signs reviewed and stable Respiratory status: spontaneous breathing, nonlabored ventilation and respiratory function stable Cardiovascular status: blood pressure returned to baseline and stable Postop Assessment: no apparent nausea or vomiting Anesthetic complications: no   No notable events documented.  Last Vitals:  Vitals:   03/08/23 1104 03/08/23 1213  BP: 133/75 120/70  Pulse: 72 66  Resp: 16 18  Temp: (!) 36.4 C (!) 36.4 C  SpO2: 97% 98%    Last Pain:  Vitals:   03/08/23 1213  TempSrc: Oral  PainSc:                  Collene Schlichter

## 2023-03-09 ENCOUNTER — Encounter (HOSPITAL_COMMUNITY): Payer: Self-pay | Admitting: Surgery

## 2023-03-09 DIAGNOSIS — K219 Gastro-esophageal reflux disease without esophagitis: Secondary | ICD-10-CM | POA: Diagnosis not present

## 2023-03-09 DIAGNOSIS — K449 Diaphragmatic hernia without obstruction or gangrene: Secondary | ICD-10-CM | POA: Diagnosis not present

## 2023-03-09 DIAGNOSIS — E66813 Obesity, class 3: Secondary | ICD-10-CM | POA: Diagnosis not present

## 2023-03-09 DIAGNOSIS — Z6841 Body Mass Index (BMI) 40.0 and over, adult: Secondary | ICD-10-CM | POA: Diagnosis not present

## 2023-03-09 LAB — CBC WITH DIFFERENTIAL/PLATELET
Abs Immature Granulocytes: 0.03 10*3/uL (ref 0.00–0.07)
Basophils Absolute: 0 10*3/uL (ref 0.0–0.1)
Basophils Relative: 0 %
Eosinophils Absolute: 0 10*3/uL (ref 0.0–0.5)
Eosinophils Relative: 0 %
HCT: 39.1 % (ref 36.0–46.0)
Hemoglobin: 12.5 g/dL (ref 12.0–15.0)
Immature Granulocytes: 0 %
Lymphocytes Relative: 13 %
Lymphs Abs: 1.2 10*3/uL (ref 0.7–4.0)
MCH: 27.3 pg (ref 26.0–34.0)
MCHC: 32 g/dL (ref 30.0–36.0)
MCV: 85.4 fL (ref 80.0–100.0)
Monocytes Absolute: 0.8 10*3/uL (ref 0.1–1.0)
Monocytes Relative: 8 %
Neutro Abs: 7.7 10*3/uL (ref 1.7–7.7)
Neutrophils Relative %: 79 %
Platelets: 209 10*3/uL (ref 150–400)
RBC: 4.58 MIL/uL (ref 3.87–5.11)
RDW: 13.4 % (ref 11.5–15.5)
WBC: 9.7 10*3/uL (ref 4.0–10.5)
nRBC: 0 % (ref 0.0–0.2)

## 2023-03-09 LAB — SURGICAL PATHOLOGY

## 2023-03-09 MED ORDER — ACETAMINOPHEN 500 MG PO TABS: 1000.0000 mg | ORAL_TABLET | Freq: Three times a day (TID) | ORAL | Status: AC

## 2023-03-09 MED ORDER — PANTOPRAZOLE SODIUM 40 MG PO TBEC: 40.0000 mg | DELAYED_RELEASE_TABLET | Freq: Every day | ORAL | 0 refills | Status: DC

## 2023-03-09 MED ORDER — HYDROCODONE-ACETAMINOPHEN 5-325 MG PO TABS: 1.0000 | ORAL_TABLET | ORAL | 0 refills | Status: DC | PRN

## 2023-03-09 MED ORDER — GABAPENTIN 100 MG PO CAPS: 100.0000 mg | ORAL_CAPSULE | Freq: Two times a day (BID) | ORAL | 0 refills | Status: DC

## 2023-03-09 MED ORDER — ONDANSETRON 4 MG PO TBDP: 4.0000 mg | ORAL_TABLET | Freq: Four times a day (QID) | ORAL | 0 refills | Status: AC | PRN

## 2023-03-09 NOTE — Discharge Summary (Signed)
Patient ID: Meghan [name] [id number] 53 y.o. 06-04-1970  03/08/2023  Discharge date and time: 03/09/2023  Admitting Physician: Hyman Hopes Joliene Salvador  Discharge Physician: Hyman Hopes Roniyah Llorens  Admission Diagnoses: Morbid obesity with BMI of 40.0-44.9, adult (HCC) [E66.01, Z68.41] Morbid obesity (HCC) [E66.01] Patient Active Problem List   Diagnosis Date Noted   Morbid obesity with BMI of 40.0-44.9, adult (HCC) 03/08/2023   Morbid obesity (HCC) 03/08/2023   Routine Papanicolaou smear 09/02/2022   PMB (postmenopausal bleeding) 09/02/2022   Positive ANA (antinuclear antibody) 08/15/2020   Epigastric abdominal tenderness without rebound tenderness 06/13/2020   Lower abdominal pain 06/03/2020   Night sweats 06/03/2020   Dysphagia 06/03/2020   Muscle ache 06/03/2020   Blood in stool 09/06/2019   Other fatigue 09/06/2019   Irregular menstrual cycle 03/09/2019   Pelvic pain 03/09/2019   Proteinuria 03/09/2019   Hematuria 03/09/2019   Changing skin lesion 02/10/2018   Seborrheic keratoses 02/10/2018   Encounter for counseling 01/07/2017   Irregular heart beat 01/07/2017   Reactive depression 01/07/2017   Stress headaches 06/23/2015   Excessive daytime sleepiness 06/23/2015   Morbid obesity due to excess calories (HCC) 06/23/2015   Change in bowel habits    Heme + stool    Heme positive stool 12/20/2014   Bowel habit changes 12/20/2014   Dysesthesia of multiple sites 08/29/2013   Migraine without aura and without status migrainosus, not intractable 08/01/2013   Irregular bleeding 01/01/2013   Endometrial polyp 11/09/2012   Menorrhagia 10/17/2012   Upper abdominal pain 10/06/2011   GERD (gastroesophageal reflux disease) 08/21/2010   Abdominal pain 08/21/2010   Cervical lymphadenopathy 08/21/2010   Localized swelling, mass, or lump of upper extremity 08/21/2010   Low back pain radiating to both legs 08/19/2010   Neck pain, chronic 08/19/2010   Numbness and tingling in  both hands 08/19/2010   Premature ventricular contractions 07/17/2008   PALPITATIONS 07/17/2008   DYSPNEA 07/17/2008   CHEST PAIN-UNSPECIFIED 07/17/2008   OBESITY 09/21/2007   COLITIS 09/21/2007   CONSTIPATION 09/21/2007   IBS 09/21/2007   NAUSEA 09/21/2007   Diarrhea 09/21/2007   Abdominal pain 09/21/2007     Discharge Diagnoses:  Patient Active Problem List   Diagnosis Date Noted   Morbid obesity with BMI of 40.0-44.9, adult (HCC) 03/08/2023   Morbid obesity (HCC) 03/08/2023   Routine Papanicolaou smear 09/02/2022   PMB (postmenopausal bleeding) 09/02/2022   Positive ANA (antinuclear antibody) 08/15/2020   Epigastric abdominal tenderness without rebound tenderness 06/13/2020   Lower abdominal pain 06/03/2020   Night sweats 06/03/2020   Dysphagia 06/03/2020   Muscle ache 06/03/2020   Blood in stool 09/06/2019   Other fatigue 09/06/2019   Irregular menstrual cycle 03/09/2019   Pelvic pain 03/09/2019   Proteinuria 03/09/2019   Hematuria 03/09/2019   Changing skin lesion 02/10/2018   Seborrheic keratoses 02/10/2018   Encounter for counseling 01/07/2017   Irregular heart beat 01/07/2017   Reactive depression 01/07/2017   Stress headaches 06/23/2015   Excessive daytime sleepiness 06/23/2015   Morbid obesity due to excess calories (HCC) 06/23/2015   Change in bowel habits    Heme + stool    Heme positive stool 12/20/2014   Bowel habit changes 12/20/2014   Dysesthesia of multiple sites 08/29/2013   Migraine without aura and without status migrainosus, not intractable 08/01/2013   Irregular bleeding 01/01/2013   Endometrial polyp 11/09/2012   Menorrhagia 10/17/2012   Upper abdominal pain 10/06/2011   GERD (gastroesophageal reflux disease) 08/21/2010   Abdominal pain  08/21/2010   Cervical lymphadenopathy 08/21/2010   Localized swelling, mass, or lump of upper extremity 08/21/2010   Low back pain radiating to both legs 08/19/2010   Neck pain, chronic 08/19/2010    Numbness and tingling in both hands 08/19/2010   Premature ventricular contractions 07/17/2008   PALPITATIONS 07/17/2008   DYSPNEA 07/17/2008   CHEST PAIN-UNSPECIFIED 07/17/2008   OBESITY 09/21/2007   COLITIS 09/21/2007   CONSTIPATION 09/21/2007   IBS 09/21/2007   NAUSEA 09/21/2007   Diarrhea 09/21/2007   Abdominal pain 09/21/2007    Operations: Procedure(s): ROBOTIC SLEEVE GASTRECTOMY UPPER GI ENDOSCOPY HERNIA REPAIR HIATAL  Admission Condition: good  Discharged Condition: good  Indication for Admission: Obesity, hiatal hernai  Hospital Course: Robotic sleeve gastrectomy with hiatal hernia repair  Consults: None  Significant Diagnostic Studies: None  Treatments: surgery: as above  Disposition: Home  Patient Instructions:  Allergies as of 03/09/2023       Reactions   Oxycodone-acetaminophen Hives, Itching   Zanaflex [tizanidine Hcl] Hives   Morphine Other (See Comments)   Neck pain        Medication List     TAKE these medications    acetaminophen 500 MG tablet Commonly known as: TYLENOL Take 2 tablets (1,000 mg total) by mouth every 8 (eight) hours for 5 days.   cetirizine 10 MG tablet Commonly known as: ZYRTEC Take 10 mg by mouth daily as needed for allergies.   gabapentin 100 MG capsule Commonly known as: NEURONTIN Take 1 capsule (100 mg total) by mouth every 12 (twelve) hours for 5 days.   HYDROcodone-acetaminophen 5-325 MG tablet Commonly known as: NORCO/VICODIN Take 1 tablet by mouth every 4 (four) hours as needed for severe pain (pain score 7-10).   ondansetron 4 MG disintegrating tablet Commonly known as: ZOFRAN-ODT Take 1 tablet (4 mg total) by mouth every 6 (six) hours as needed for nausea or vomiting.   pantoprazole 40 MG tablet Commonly known as: PROTONIX Take 1 tablet (40 mg total) by mouth daily.        Activity: no heavy lifting for 4 weeks Diet:  Bariatric Wound Care: keep wound clean and dry  Follow-up:  With Dr.  Dossie Der  Signed: Hyman Hopes Shaeley Segall [facility] B[facility]ally In[facility], [place]   03/09/2023, 8:43 AM

## 2023-03-09 NOTE — Progress Notes (Signed)
   03/09/23 0729  TOC Brief Assessment  Insurance and Status Reviewed  Patient has primary care physician Yes  Home environment has been reviewed resides in private residence with spouse  Prior level of function: Independent  Prior/Current Home Services No current home services  Social Drivers of Health Review SDOH reviewed no interventions necessary  Readmission risk has been reviewed Yes  Transition of care needs no transition of care needs at this time

## 2023-03-09 NOTE — Progress Notes (Signed)
Patient alert and oriented, pain is controlled. Patient is tolerating fluids, advanced to protein shake today, patient is tolerating well. Reviewed Gastric sleeve/bypass discharge instructions with patient and patient is able to articulate understanding. Provided information on BELT program, Support Group, BSTOP-D, and [facility] outpatient pharmacy. Communicated general update of patient status to surgeon. All questions answered. 24hr fluid recall is 480 mL per hydration protocol, bariatric nurse coordinator to make follow-up phone call within one week.    Thank you,  Lubertha Basque, RN, MSN Bariatric Nurse Coordinator [phone] (office)

## 2023-03-14 ENCOUNTER — Encounter: Payer: Self-pay | Admitting: Student

## 2023-03-14 ENCOUNTER — Ambulatory Visit: Payer: Self-pay | Admitting: Student

## 2023-03-14 DIAGNOSIS — Z719 Counseling, unspecified: Secondary | ICD-10-CM

## 2023-03-14 NOTE — Progress Notes (Signed)
 Patient is a 53 year old female who presents to the clinic today for hyperpigmentation.  She states that she has had several BBL lasers, but has had several spots of hyperpigmentation that are still present.  She states that she has had the spots looked at by dermatologist and they were okay to laser.  Patient reports the spots are just to her cheeks and she would only like her cheeks lasered.  I did offer to laser her forehead, but she declined.  Patient reported she did well with her previous BBL lasers and did not have any specific issues.  We did discuss risk of BBL laser including burning today.  Patient expressed understanding and wanted to move forward with the procedure.   Preoperative Dx: Hyperpigmentation to the face  Postoperative Dx:  same  Procedure: laser to face   Anesthesia: none  Description of Procedure:  Risks and complications were explained to the patient. Consent was confirmed. Time out was called and all information was confirmed to be correct. The area  area was prepped with alcohol and wiped dry.  Ultrasound gel was applied to the face.  Using the 532 filter, the BBL heroic laser was set at 2.5 J/cm with a pulse width of 3 ms and an overlap of 10%, at a cooling temperature of 25 C.  The cheeks were lasered.  Using the 532 filter, the BBL heroic laser was then set to 9 J/cm using a pulse width of 15 and a cooling temperature of 25 C.  The 7 mm and 11 mm attachments are used to target specific areas of hyperpigmentation to the cheeks and lower face. The patient tolerated the procedure well and there were no complications.  We discussed postprocedural care and I instructed the patient to call if she has any questions.  The patient is to follow up in 4 weeks.

## 2023-03-15 ENCOUNTER — Telehealth (HOSPITAL_COMMUNITY): Payer: Self-pay | Admitting: *Deleted

## 2023-03-15 NOTE — Telephone Encounter (Signed)
 1. Tell me about your pain and pain management?     Pt stated she overall has managed the pain with the gabapentin and half of the narcotic medication.   2. Let's talk about fluid intake. How much total fluid are you taking in?    Pt states that s/he is working to meet goal of 64 oz of fluid today. Pt encouraged to continue to work towards meeting goal. Pt instructed to assess status and suggestions daily utilizing Hydration Action Plan on discharge folder and to call [facility] if in the "red zone".      3. How much protein have you taken in the last day?     Pt states she is meeting the goal of 60g of protein each day with the protein shakes     4. Have you had nausea? Tell me about when you have experienced nausea and what you did to help?   Pt denies nausea.   5. Has the frequency or color changed with your urine?   Pt states did not report any changes in frequency or urgency.   6. Tell me what your incisions look like?   "Incisions look fine". Pt denies a fever, chills. Pt states incisions are not swollen, open, or draining. Pt encouraged to call [facility] if incisions change.    7. Have you been passing gas? BM?   Pt states that they are having BMs. Patient stated that she still feels a little constipated and is taking MoM  Pt states that they have had a BM. Pt instructed to take either Miralax or MoM as instructed per "Gastric Bypass/Sleeve Discharge Home Care Instructions". Pt to call surgeon's office if not able to have BM with medication.      8. If a problem or question were to arise who would you call? Do you know contact numbers for [facility], [facility], and [facility]?   Pt knows to call [facility] for surgical, [facility] for nutrition, and [facility] for non-urgent questions or concerns. Pt denies dehydration symptoms. Pt can describe s/sx of dehydration.   9. How has the walking going?   Pt states s/he is walking around and able to be active without difficulty.   Patient needed to get off the phone at  the time. No further questions at this time. Conversation ended.

## 2023-03-16 ENCOUNTER — Ambulatory Visit (INDEPENDENT_AMBULATORY_CARE_PROVIDER_SITE_OTHER): Payer: Federal, State, Local not specified - PPO | Admitting: Otolaryngology

## 2023-03-16 ENCOUNTER — Encounter (INDEPENDENT_AMBULATORY_CARE_PROVIDER_SITE_OTHER): Payer: Self-pay

## 2023-03-16 VITALS — BP 126/82 | HR 80 | Ht 69.0 in | Wt 276.0 lb

## 2023-03-16 DIAGNOSIS — H93A3 Pulsatile tinnitus, bilateral: Secondary | ICD-10-CM | POA: Diagnosis not present

## 2023-03-17 DIAGNOSIS — H93A3 Pulsatile tinnitus, bilateral: Secondary | ICD-10-CM | POA: Insufficient documentation

## 2023-03-17 DIAGNOSIS — H93A2 Pulsatile tinnitus, left ear: Secondary | ICD-10-CM | POA: Insufficient documentation

## 2023-03-17 NOTE — Progress Notes (Signed)
 Patient ID: Meghan [name], female   DOB: 18-Dec-1970, 53 y.o.   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 female who presents today complaining of bilateral pulsatile tinnitus for 1 year.  The tinnitus is worse on the left side.  In addition to the pulsatile tinnitus, she also has a constant high-pitched ringing noise.  The severity of her tinnitus has increased over the past 3 months.  He has no recent known otitis media or otitis externa.  She has no previous ENT surgeries.  She also denies any significant hearing difficulty.  Currently she denies any otalgia, otorrhea, or vertigo.  Past Medical History:  Diagnosis Date   Abdominal pain    RLQ   Back pain    Chest pain, unspecified    Colitis    see psh for colonoscopy reports   Constipation    Dyspnea    Endometrial polyp 11/09/2012   Enlarged lymph node    right side - arm   Family history of ovarian cancer    MGM   Generalized headaches    GERD (gastroesophageal reflux disease)    Hemorrhoids    Hiatal hernia    Hip pain    History of kidney stones    IBS (irritable bowel syndrome)    IBS (irritable bowel syndrome)    Insomnia    Irregular bleeding 01/01/2013   Menorrhagia 10/17/2012   Migraines    Nausea    Obesity, unspecified    Palpitations    Pneumonia 2006   PVC (premature ventricular contraction)    Vitamin D deficiency     Past Surgical History:  Procedure Laterality Date   ABLATION     Uterine   BALLOON DILATION N/A 09/23/2020   Procedure: BALLOON DILATION;  Surgeon: Lanelle Bal, DO;  Location: [facility];  Service: Endoscopy;  Laterality: N/A;   BIOPSY  09/23/2020   Procedure: BIOPSY;  Surgeon: Lanelle Bal, DO;  Location: [facility];  Service: Endoscopy;;   COLONOSCOPY  2005   Dr. Elpidio Anis. Colitis of cecum and ICV, bx nonspecific   COLONOSCOPY N/A 02/10/2015   Minimal internal hemorrhoids, otherwise  normal-appearing rectal mucosa. Normal-appearing colonic mucosa. Distal 10 cm of TI also appeared normal. Segmental biopsies taken benign.    colonoscopy with ileoscopy  2008   Dr. Karilyn Cota. patch of coarse mucosa at TI, bx  neg.  Random colon bx showed minimal active focal colitis ?resolving infection. Felt not c/w IBD.   COLONOSCOPY WITH PROPOFOL N/A 09/23/2020   Nonbleeding internal hemorrhoids, 2 mm tubular adenoma removed from the transverse colon, random colon biopsies negative.  Next colonoscopy in 5 years.   DILITATION & CURRETTAGE/HYSTROSCOPY WITH THERMACHOICE ABLATION N/A 12/01/2012   Procedure: DILATATION & CURETTAGE;REMOVAL OF ENDOMETRIAL POLYP; HYSTEROSCOPY WITH THERMACHOICE ENDOMETRIAL ABLATION;  Surgeon: [name], MD;  Location: [facility];  Service: Gynecology;  Laterality: N/A;   ESOPHAGOGASTRODUODENOSCOPY  02/2008   Dr. Jena Gauss- birnak appearing tubular esophagus. small hiatal hernia, o/w normal stomach, D1, D2   ESOPHAGOGASTRODUODENOSCOPY (EGD) WITH PROPOFOL N/A 09/23/2020   Small hiatal hernia, gastritis with unremarkable biopsies.   HIATAL HERNIA REPAIR N/A 03/08/2023   Procedure: HERNIA REPAIR HIATAL;  Surgeon: Quentin Ore, MD;  Location: [facility];  Service: General;  Laterality: N/A;   KIDNEY STONE SURGERY     KNEE ARTHROSCOPY  2008   ACL repair   LAPAROSCOPIC BILATERAL SALPINGECTOMY Bilateral  12/01/2012   Procedure: LAPAROSCOPIC BILATERAL SALPINGECTOMY ;  Surgeon: [name], MD;  Location: [facility];  Service: Gynecology;  Laterality: Bilateral;   LESION REMOVAL Left 12/01/2012   Procedure: SKIN TAG REMOVAL (Left inner thigh) ;  Surgeon: [name], MD;  Location: [facility];  Service: Gynecology;  Laterality: Left;   POLYPECTOMY  09/23/2020   Procedure: POLYPECTOMY;  Surgeon: Lanelle Bal, DO;  Location: [facility];  Service: Endoscopy;;   UPPER GI ENDOSCOPY N/A 03/08/2023   Procedure: UPPER GI ENDOSCOPY;  Surgeon: Quentin Ore, MD;  Location:  [facility];  Service: General;  Laterality: N/A;    Family History  Problem Relation Age of Onset   Scleroderma Mother    Sleep apnea Mother    Obesity Mother    Hypertension Father    Cancer Father        liver   Cirrhosis Father        non-alcoholic   Obesity Father    Other Sister        Transverse myelitis   Cancer Maternal Grandmother        ovarian cancer   Diabetes Maternal Grandfather    Stroke Paternal Grandmother    Hypertension Paternal Grandmother    Colon cancer Neg Hx    Colon polyps Neg Hx     Social History:  reports that she has never smoked. She has been exposed to tobacco smoke. She has never used smokeless tobacco. She reports current alcohol use. She reports that she does not use drugs.  Allergies:  Allergies  Allergen Reactions   Oxycodone-Acetaminophen Hives and Itching   Zanaflex [Tizanidine Hcl] Hives   Morphine Other (See Comments)    Neck pain    Prior to Admission medications   Medication Sig Start Date End Date Taking? Authorizing Provider  cetirizine (ZYRTEC) 10 MG tablet Take 10 mg by mouth daily as needed for allergies.   Yes [provider]  HYDROcodone-acetaminophen (NORCO/VICODIN) 5-325 MG tablet Take 1 tablet by mouth every 4 (four) hours as needed for severe pain (pain score 7-10). 03/09/23 03/08/24 Yes Stechschulte, Hyman Hopes, MD  ondansetron (ZOFRAN-ODT) 4 MG disintegrating tablet Take 1 tablet (4 mg total) by mouth every 6 (six) hours as needed for nausea or vomiting. 03/09/23  Yes Stechschulte, Hyman Hopes, MD  pantoprazole (PROTONIX) 40 MG tablet Take 1 tablet (40 mg total) by mouth daily. 03/09/23  Yes Stechschulte, Hyman Hopes, MD  gabapentin (NEURONTIN) 100 MG capsule Take 1 capsule (100 mg total) by mouth every 12 (twelve) hours for 5 days. 03/09/23 03/14/23  Stechschulte, Hyman Hopes, MD    Blood pressure 126/82, pulse 80, height 5\' 9"  (1.753 m), weight 276 lb (125.2 kg), SpO2 95%. Exam: General: Communicates without difficulty, well  nourished, no acute distress. Head: Normocephalic, no evidence injury, no tenderness, facial buttresses intact without stepoff. Face/sinus: No tenderness to palpation and percussion. Facial movement is normal and symmetric. Eyes: PERRL, EOMI. No scleral icterus, conjunctivae clear. Neuro: CN II exam reveals vision grossly intact.  No nystagmus at any point of gaze. Ears: Auricles well formed without lesions.  Ear canals are intact without mass or lesion.  No erythema or edema is appreciated.  The TMs are intact without fluid. Nose: External evaluation reveals normal support and skin without lesions.  Dorsum is intact.  Anterior rhinoscopy reveals normal mucosa over anterior aspect of inferior turbinates and intact septum.  No purulence noted. Oral:  Oral cavity and oropharynx are intact, symmetric,  without erythema or edema.  Mucosa is moist without lesions. Neck: Full range of motion without pain.  There is no significant lymphadenopathy.  No masses palpable.  Thyroid bed within normal limits to palpation.  Parotid glands and submandibular glands equal bilaterally without mass.  Trachea is midline. Neuro:  CN 2-12 grossly intact.   Assessment: 1.  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 The possible differential diagnoses include vascular tumor, AV malformation, hyperdynamic state, aneurysm, sinus thrombosis, or other idiopathic causes.  The patient also complains of bilateral constant high-pitched ringing noise. 2.  Her ear canals, tympanic membranes, and middle ear spaces are normal.  No middle ear effusion or infection is noted.  Plan: 1.  The physical exam findings are reviewed with the patient. 2.  The strategies to cope with tinnitus, including the use of masker, hearing aids, tinnitus retraining therapy, and avoidance of caffeine and alcohol are discussed. 3.  MRI scan to evaluate for possible causes of her pulsatile tinnitus. 4.  The patient will return for reevaluation in 4 weeks.   We will obtain a hearing test at that time.  No audiologist is available today.  Kolson Chovanec W Tyheem Boughner 03/17/2023, 8:44 AM

## 2023-03-22 ENCOUNTER — Encounter: Payer: Federal, State, Local not specified - PPO | Attending: Surgery | Admitting: Dietician

## 2023-03-22 ENCOUNTER — Encounter: Payer: Self-pay | Admitting: Dietician

## 2023-03-22 VITALS — Ht 69.0 in | Wt 276.2 lb

## 2023-03-22 DIAGNOSIS — E669 Obesity, unspecified: Secondary | ICD-10-CM | POA: Insufficient documentation

## 2023-03-22 NOTE — Progress Notes (Signed)
 2 Week Post-Operative Nutrition Class   Patient was seen on 03/22/2023 for Post-Operative Nutrition education at the [facility].    Surgery date: 03/08/2023 Surgery type: Sleeve Gastrectomy Pt expectation of surgery: lose weight for knee pain relief  Anthropometrics  Start weight at [facility]: 282.0 lbs (date: 01/07/2023)  Height: 69 in Weight today: 273.2 lb  Clinical   Pharmacotherapy: History of weight loss medication used: GLP-1, oral medications  Medical hx: obesity Medications: progesterone, ondasetron   Labs: WNL Notable signs/symptoms: nothing noted Any previous deficiencies? No Bowel Habits: Every day to every other day no complaints   Body Composition Scale 03/22/2023  Current Body Weight 276.2  Total Body Fat % 45.2  Visceral Fat 16  Fat-Free Mass % 54.7   Total Body Water % 41.8  Muscle-Mass lbs 35.3  BMI 40.6  Body Fat Displacement          Torso  lbs 77.5         Left Leg  lbs 15.5         Right Leg  lbs 15.5         Left Arm  lbs 7.7         Right Arm  lbs 7.7      The following the learning objectives were met by the patient during this course: Identifies Soft Prepped Plan Advancement Guide  Identifies Soft, High Proteins (Phase 1), beginning 2 weeks post-operatively to 3 weeks post-operatively Identifies Additional Soft High Proteins, soft non-starchy vegetables, fruits and starches (Phase 2), beginning 3 weeks post-operatively to 3 months post-operatively Identifies appropriate sources of fluids, proteins, vegetables, fruits and starches Identifies appropriate fat sources and healthy verses unhealthy fat types   States protein, vegetable, fruit and starch recommendations and appropriate sources post-operatively Identifies the need for appropriate texture modifications, mastication, and bite sizes when consuming solids Identifies appropriate fat consumption and sources Identifies appropriate multivitamin and calcium sources  post-operatively Describes the need for physical activity post-operatively and will follow MD recommendations States when to call healthcare provider regarding medication questions or post-operative complications   Handouts given during class include: Soft Prepped Plan Advancement Guide   Follow-Up Plan: Patient will follow-up at [facility] in 10 weeks for 3 month post-op nutrition visit for diet advancement per MD.

## 2023-03-24 ENCOUNTER — Other Ambulatory Visit (HOSPITAL_COMMUNITY): Payer: Self-pay | Admitting: Nurse Practitioner

## 2023-03-24 DIAGNOSIS — D513 Other dietary vitamin B12 deficiency anemia: Secondary | ICD-10-CM | POA: Diagnosis not present

## 2023-03-24 DIAGNOSIS — E559 Vitamin D deficiency, unspecified: Secondary | ICD-10-CM | POA: Diagnosis not present

## 2023-03-24 DIAGNOSIS — H93A9 Pulsatile tinnitus, unspecified ear: Secondary | ICD-10-CM | POA: Diagnosis not present

## 2023-03-24 DIAGNOSIS — R42 Dizziness and giddiness: Secondary | ICD-10-CM

## 2023-03-24 DIAGNOSIS — R002 Palpitations: Secondary | ICD-10-CM | POA: Diagnosis not present

## 2023-03-24 DIAGNOSIS — H9313 Tinnitus, bilateral: Secondary | ICD-10-CM | POA: Diagnosis not present

## 2023-03-24 DIAGNOSIS — D508 Other iron deficiency anemias: Secondary | ICD-10-CM | POA: Diagnosis not present

## 2023-03-24 DIAGNOSIS — Z9884 Bariatric surgery status: Secondary | ICD-10-CM | POA: Diagnosis not present

## 2023-03-30 ENCOUNTER — Ambulatory Visit (HOSPITAL_COMMUNITY)
Admission: RE | Admit: 2023-03-30 | Discharge: 2023-03-30 | Disposition: A | Discharge status: Home / Self Care | Source: Ambulatory Visit | Attending: Otolaryngology | Admitting: Otolaryngology

## 2023-03-30 DIAGNOSIS — H93A3 Pulsatile tinnitus, bilateral: Secondary | ICD-10-CM | POA: Insufficient documentation

## 2023-03-30 MED ORDER — GADOBUTROL 1 MMOL/ML IV SOLN
10.0000 mL | Freq: Once | INTRAVENOUS | Status: AC | PRN
  Administered 2023-03-30: 10 mL via INTRAVENOUS

## 2023-03-31 ENCOUNTER — Telehealth: Payer: Self-pay | Admitting: Dietician

## 2023-03-31 NOTE — Telephone Encounter (Signed)
 RD called pt to verify fluid intake once starting soft, solid proteins 2 week post-bariatric surgery.   Daily Fluid intake: 64 oz per day Daily Protein intake: 60 grams per day Bowel Habits: no issues  Concerns/issues: pt states everything is great

## 2023-04-01 ENCOUNTER — Ambulatory Visit (HOSPITAL_COMMUNITY)

## 2023-04-07 ENCOUNTER — Ambulatory Visit (HOSPITAL_COMMUNITY)
Admission: RE | Admit: 2023-04-07 | Discharge: 2023-04-07 | Disposition: A | Discharge status: Home / Self Care | Source: Ambulatory Visit | Attending: Nurse Practitioner | Admitting: Nurse Practitioner

## 2023-04-07 DIAGNOSIS — R42 Dizziness and giddiness: Secondary | ICD-10-CM | POA: Insufficient documentation

## 2023-04-14 ENCOUNTER — Telehealth (INDEPENDENT_AMBULATORY_CARE_PROVIDER_SITE_OTHER): Payer: Self-pay | Admitting: Audiology

## 2023-04-14 NOTE — Telephone Encounter (Signed)
 Confirmed time and location for both appointments -[address] [place], [place] [address]

## 2023-04-15 ENCOUNTER — Encounter (INDEPENDENT_AMBULATORY_CARE_PROVIDER_SITE_OTHER): Payer: Self-pay

## 2023-04-15 ENCOUNTER — Ambulatory Visit (INDEPENDENT_AMBULATORY_CARE_PROVIDER_SITE_OTHER): Payer: Federal, State, Local not specified - PPO | Admitting: Audiology

## 2023-04-15 ENCOUNTER — Encounter: Payer: Self-pay | Admitting: Plastic Surgery

## 2023-04-15 ENCOUNTER — Ambulatory Visit: Admitting: Plastic Surgery

## 2023-04-15 ENCOUNTER — Ambulatory Visit (INDEPENDENT_AMBULATORY_CARE_PROVIDER_SITE_OTHER): Payer: Federal, State, Local not specified - PPO | Admitting: Otolaryngology

## 2023-04-15 VITALS — BP 130/82 | Ht 69.0 in | Wt 266.0 lb

## 2023-04-15 DIAGNOSIS — L989 Disorder of the skin and subcutaneous tissue, unspecified: Secondary | ICD-10-CM

## 2023-04-15 DIAGNOSIS — H93A2 Pulsatile tinnitus, left ear: Secondary | ICD-10-CM | POA: Diagnosis not present

## 2023-04-15 DIAGNOSIS — H903 Sensorineural hearing loss, bilateral: Secondary | ICD-10-CM | POA: Diagnosis not present

## 2023-04-15 DIAGNOSIS — H9042 Sensorineural hearing loss, unilateral, left ear, with unrestricted hearing on the contralateral side: Secondary | ICD-10-CM

## 2023-04-15 NOTE — Progress Notes (Signed)
  [address] [place], [place] [address] [phone]  Audiological Evaluation    Name: Meghan [name]     DOB:   [birth year]      MRN:   [id number]                                                                                     Service Date: 04/15/2023     Accompanied by: unaccompanied   Patient comes today after Dr. Suszanne Conners, ENT sent a referral for a hearing evaluation due to concerns with tinnitus.   Symptoms Yes Details  Hearing loss  []    Tinnitus  [x]  Both ears - worse in the left ear  Ear pain/ infections/pressure  []  Only as a child  Balance problems  [x]  Some spinning when she moves quickly, lays down or stand up quickly.  Noise exposure history  [x]  Not really- only recalls loud music as a teenager  Previous ear surgeries  []    Family history of hearing loss  [x]  Mother with age  Amplification  []    Other  []      Otoscopy: Right ear: Clear external ear canals and notable landmarks visualized on the tympanic membrane. Left ear:  Clear external ear canals and notable landmarks visualized on the tympanic membrane.  Tympanometry: Right ear: Type A- Normal external ear canal volume with normal middle ear pressure and tympanic membrane compliance Left ear: Type A- Normal external ear canal volume with normal middle ear pressure and tympanic membrane compliance   Pure tone Audiometry: Right ear- Normal hearing from [phone] Hz, then severe presumably sensorineural hearing loss at 12000 Hz. Left ear-  Normal hearing from [phone] Hz, with a mild presumably sensorineural hearing loss notch from at 6000 Hz and severe hearing loss at 12000 Hz.  Speech Audiometry: Right ear- Speech Reception Threshold (SRT) was obtained at 5 dBHL. Left ear-Speech Reception Threshold (SRT) was obtained at 5 dBHL.   Word Recognition Score Tested using NU-6 (MLV) Right ear: 100% was obtained at a presentation level of 55 dBHL with contralateral masking which is deemed as   excellent. Left ear: 100% was obtained at a presentation level of 55 dBHL with contralateral masking which is deemed as  excellent.   The hearing test results were completed under headphones and re-checked with inserts and results are deemed to be of good reliability. Test technique:  conventional      Recommendations: Follow up with ENT as scheduled for today. Return for a hearing evaluation if concerns with hearing changes arise or per MD recommendation. Consider various tinnitus strategies, including the use of a sound generator, and/or tinnitus retraining therapy.    Douglass Dunshee MARIE LEROUX-MARTINEZ, AUD

## 2023-04-15 NOTE — Progress Notes (Signed)
 Reschedule

## 2023-04-16 DIAGNOSIS — H9042 Sensorineural hearing loss, unilateral, left ear, with unrestricted hearing on the contralateral side: Secondary | ICD-10-CM | POA: Insufficient documentation

## 2023-04-16 NOTE — Progress Notes (Signed)
 Patient ID: Meghan [name], female   DOB: 31-Oct-1970, 53 y.o.   MRN: [id number]  Follow-up: Left ear pulsatile tinnitus  HPI: The patient is a 53 year old female who returns today for her follow-up evaluation.  She was last seen in February 2025.  At that time, she was complaining of constant left ear pulsatile tinnitus.  Her physical examination was normal.  She subsequently underwent an MRI scan.  The MRI was negative for any significant intracranial abnormality.  The patient returns today complaining of persistent left ear pulsatile tinnitus.  She denies any otalgia, otorrhea, or vertigo.  Exam: General: Communicates without difficulty, well nourished, no acute distress. Head: Normocephalic, no evidence injury, no tenderness, facial buttresses intact without stepoff. Face/sinus: No tenderness to palpation and percussion. Facial movement is normal and symmetric. Eyes: PERRL, EOMI. No scleral icterus, conjunctivae clear. Neuro: CN II exam reveals vision grossly intact.  No nystagmus at any point of gaze. Ears: Auricles well formed without lesions.  Ear canals are intact without mass or lesion.  No erythema or edema is appreciated.  The TMs are intact without fluid. Nose: External evaluation reveals normal support and skin without lesions.  Dorsum is intact.  Anterior rhinoscopy reveals normal mucosa over anterior aspect of inferior turbinates and intact septum.  No purulence noted. Oral:  Oral cavity and oropharynx are intact, symmetric, without erythema or edema.  Mucosa is moist without lesions. Neck: Full range of motion without pain.  There is no significant lymphadenopathy.  No masses palpable.  Thyroid bed within normal limits to palpation.  Parotid glands and submandibular glands equal bilaterally without mass.  Trachea is midline. Neuro:  CN 2-12 grossly intact.   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   Assessment: Left ear pulsatile  tinnitus of unknown etiology.  The possible differential diagnoses include vascular tumor, AV malformation, hyperdynamic state, aneurysm, sinus thrombosis, or other idiopathic causes.  Her ear canals, tympanic membranes, and middle ear spaces are normal.  No middle ear effusion or infection is noted. A recent MRI scan is negative for intracranial lesion. 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  Plan: 1.  The physical exam findings and the MRI results are reviewed with the patient. 2.  The hearing test results also reviewed. 3.  CT angiography to evaluate for other possible causes of her left ear pulsatile tinnitus. 4.  The patient will return for reevaluation after her angiography study.

## 2023-04-20 ENCOUNTER — Other Ambulatory Visit (HOSPITAL_COMMUNITY): Payer: Self-pay | Admitting: Internal Medicine

## 2023-04-20 DIAGNOSIS — Z1231 Encounter for screening mammogram for malignant neoplasm of breast: Secondary | ICD-10-CM

## 2023-04-21 ENCOUNTER — Other Ambulatory Visit: Payer: Self-pay | Admitting: Student

## 2023-04-21 ENCOUNTER — Other Ambulatory Visit (HOSPITAL_COMMUNITY): Payer: Self-pay

## 2023-04-21 ENCOUNTER — Telehealth: Payer: Self-pay | Admitting: Plastic Surgery

## 2023-04-21 MED ORDER — LIDOCAINE 23% - TETRACAINE 7% TOPICAL OINTMENT (PLASTICIZED)
1.0000 | TOPICAL_OINTMENT | Freq: Once | CUTANEOUS | 0 refills | Status: AC
  Filled 2023-04-21: qty 60, 1d supply, fill #0

## 2023-04-21 NOTE — Telephone Encounter (Signed)
 Patient has a Moxi on July 11th and will need numbing cream

## 2023-04-21 NOTE — Progress Notes (Signed)
 Numbing Cream for upcoming Moxi laser

## 2023-04-28 ENCOUNTER — Ambulatory Visit (HOSPITAL_COMMUNITY): Admission: RE | Admit: 2023-04-28 | Source: Ambulatory Visit

## 2023-05-03 ENCOUNTER — Other Ambulatory Visit (HOSPITAL_COMMUNITY): Payer: Self-pay

## 2023-05-03 DIAGNOSIS — M545 Low back pain, unspecified: Secondary | ICD-10-CM | POA: Diagnosis not present

## 2023-05-03 DIAGNOSIS — M48061 Spinal stenosis, lumbar region without neurogenic claudication: Secondary | ICD-10-CM | POA: Diagnosis not present

## 2023-05-03 DIAGNOSIS — M25562 Pain in left knee: Secondary | ICD-10-CM | POA: Diagnosis not present

## 2023-05-03 DIAGNOSIS — M170 Bilateral primary osteoarthritis of knee: Secondary | ICD-10-CM | POA: Diagnosis not present

## 2023-05-04 ENCOUNTER — Ambulatory Visit (HOSPITAL_COMMUNITY)
Admission: RE | Admit: 2023-05-04 | Discharge: 2023-05-04 | Disposition: A | Discharge status: Home / Self Care | Source: Ambulatory Visit | Attending: Otolaryngology | Admitting: Otolaryngology

## 2023-05-04 DIAGNOSIS — H93A2 Pulsatile tinnitus, left ear: Secondary | ICD-10-CM | POA: Insufficient documentation

## 2023-05-04 DIAGNOSIS — I6529 Occlusion and stenosis of unspecified carotid artery: Secondary | ICD-10-CM | POA: Diagnosis not present

## 2023-05-04 MED ORDER — IOHEXOL 350 MG/ML SOLN
75.0000 mL | Freq: Once | INTRAVENOUS | Status: AC | PRN
  Administered 2023-05-04: 75 mL via INTRAVENOUS

## 2023-05-11 ENCOUNTER — Other Ambulatory Visit: Payer: Self-pay | Admitting: Student

## 2023-05-11 ENCOUNTER — Other Ambulatory Visit (HOSPITAL_COMMUNITY): Payer: Self-pay

## 2023-05-11 ENCOUNTER — Ambulatory Visit (HOSPITAL_COMMUNITY)
Admission: RE | Admit: 2023-05-11 | Discharge: 2023-05-11 | Disposition: A | Discharge status: Home / Self Care | Source: Ambulatory Visit | Attending: Internal Medicine | Admitting: Internal Medicine

## 2023-05-11 DIAGNOSIS — Z1231 Encounter for screening mammogram for malignant neoplasm of breast: Secondary | ICD-10-CM | POA: Insufficient documentation

## 2023-05-11 MED ORDER — LIDOCAINE 23% - TETRACAINE 7% TOPICAL OINTMENT (PLASTICIZED)
1.0000 | TOPICAL_OINTMENT | Freq: Once | CUTANEOUS | 0 refills | Status: AC
  Filled 2023-05-11: qty 60, 1d supply, fill #0

## 2023-05-11 NOTE — Progress Notes (Signed)
 Numbing cream for Moxi laser - appears previous order expired

## 2023-05-13 ENCOUNTER — Other Ambulatory Visit (HOSPITAL_COMMUNITY): Payer: Self-pay

## 2023-05-18 ENCOUNTER — Other Ambulatory Visit (HOSPITAL_COMMUNITY): Payer: Self-pay

## 2023-05-18 ENCOUNTER — Telehealth: Payer: Self-pay

## 2023-05-18 NOTE — Telephone Encounter (Signed)
 Called the patient and left a voicemail to call us  back. She sent a message about the numbing cream for her laser. PA sent to [facility] because it is a [employer], but this patient lives in [place]. We can sent it to a compounding pharmacy in [place] if that works best for her, when she calls/sends a message back.

## 2023-05-23 ENCOUNTER — Telehealth: Payer: Self-pay | Admitting: Student

## 2023-05-23 NOTE — Telephone Encounter (Signed)
 Spoke with patient about her upcoming Moxy laser on Thursday.  Discussed with her that she should avoid direct sunlight if she can and make sure that she is wearing sunscreen and hats when outside.  Patient expressed understanding.  I also discussed with the patient she should avoid retinol's for now.  Patient states that she is not a smoker.  She also reports that she picked up her numbing cream, and I recommend that she come about 30 minutes before her appointment to apply.  Patient expressed understanding.  All of her questions were answered to her satisfaction.

## 2023-05-26 ENCOUNTER — Other Ambulatory Visit: Payer: Self-pay | Admitting: Student

## 2023-05-26 ENCOUNTER — Ambulatory Visit (INDEPENDENT_AMBULATORY_CARE_PROVIDER_SITE_OTHER): Payer: Self-pay | Admitting: Student

## 2023-05-26 DIAGNOSIS — Z719 Counseling, unspecified: Secondary | ICD-10-CM

## 2023-05-26 NOTE — Progress Notes (Signed)
 Preoperative Dx: hyperpigmentation  Postoperative Dx:  same  Procedure: laser to face   Anesthesia: Lidocaine  23%, tetracaine  7% topical ointment  Description of Procedure:  Risks and complications were explained to the patient. Consent was confirmed and signed. Time out was called and all information was confirmed to be correct. The area was prepped with alcohol and wiped dry.  The Moxi laser was set to level 3, at 15 mJ, total coverage was 15% and we performed 6 passes.  Patient tolerated the procedure well.  Postprocedural care was discussed with the patient.   Discussed with patient that she may call with any questions or concerns.

## 2023-05-31 ENCOUNTER — Other Ambulatory Visit: Payer: Self-pay | Admitting: Student

## 2023-05-31 MED ORDER — VALACYCLOVIR HCL 1 G PO TABS: 1000.0000 mg | ORAL_TABLET | Freq: Two times a day (BID) | ORAL | 0 refills | Status: AC

## 2023-05-31 NOTE — Progress Notes (Signed)
 Patient reached out stating that she was starting to break out near her mouth and chin.  She states that the lesions are a little itchy and a little painful.  She reports they are draining a little bit.  She denies any history of cold sores.  Patient sent photo.  Reviewed photo.  Near the chin, it appears there may be a small vesicle with some surrounding irritation.  Recommended an antiviral at this time.  Also discussed with the patient to keep the area clean and to continue to moisturize.  I discussed with her she may clean the area with Vashe to if she would like.  Patient expressed understanding.

## 2023-06-01 ENCOUNTER — Encounter: Payer: Federal, State, Local not specified - PPO | Attending: Surgery | Admitting: Skilled Nursing Facility1

## 2023-06-01 ENCOUNTER — Encounter: Payer: Self-pay | Admitting: Skilled Nursing Facility1

## 2023-06-01 DIAGNOSIS — E669 Obesity, unspecified: Secondary | ICD-10-CM | POA: Diagnosis not present

## 2023-06-01 NOTE — Progress Notes (Signed)
 Bariatric Nutrition Follow-Up Visit Medical Nutrition Therapy  Appt Start Time: 10:07   End Time: 10: 31  NUTRITION ASSESSMENT     Surgery date: 03/08/2023 Surgery type: Sleeve Gastrectomy Pt expectation of surgery: lose weight for knee pain relief  Anthropometrics  Start weight at [facility]: 282.0 lbs (date: 01/07/2023)  Height: 69 in Weight today: pt declined   Clinical   Pharmacotherapy: History of weight loss medication used: GLP-1, oral medications  Medical hx: obesity Medications: progesterone , ondasetron   Labs: WNL Notable signs/symptoms: nothing noted Any previous deficiencies? No Bowel Habits: Every day to every other day no complaints   Body Composition Scale 03/22/2023  Current Body Weight 276.2  Total Body Fat % 45.2  Visceral Fat 16  Fat-Free Mass % 54.7   Total Body Water  % 41.8  Muscle-Mass lbs 35.3  BMI 40.6  Body Fat Displacement          Torso  lbs 77.5         Left Leg  lbs 15.5         Right Leg  lbs 15.5         Left Arm  lbs 7.7         Right Arm  lbs 7.7     Lifestyle & Dietary Hx  Pt states grilled chicken will make her throw up.   Pt states she uses low fat/low sugar recipes for her sweets.    Estimated daily fluid intake:  oz Estimated daily protein intake:  g Supplements: calcium and multivitamin  Current average weekly physical activity: walking 20 minutes 7 days a week and some resistance   24-Hr Dietary Recall First Meal: protein shake + collegan + mirilax or eggs + grits + bacon Snack:  protein bar Second Meal: fruit or frozen corn Snack:   Third Meal: shrimp or tilapia or pizza with cauliflower crust Snack:  Beverages: water , zero powerade   Post-Op Goals/ Signs/ Symptoms Using straws: no Drinking while eating: no Chewing/swallowing difficulties: no Changes in vision: no Changes to mood/headaches: no Hair loss/changes to skin/nails: no Difficulty focusing/concentrating: no Sweating: no Limb weakness:  no Dizziness/lightheadedness: no Palpitations: no  Carbonated/caffeinated beverages: no N/V/D/C/Gas: no Abdominal pain: no Dumping syndrome: no    NUTRITION DIAGNOSIS  Overweight/obesity ([place]-3.3) related to past poor dietary habits and physical inactivity as evidenced by completed bariatric surgery and following dietary guidelines for continued weight loss and healthy nutrition status.     NUTRITION INTERVENTION Nutrition counseling (C-1) and education (E-2) to facilitate bariatric surgery goals, including: The importance of consuming adequate calories as well as certain nutrients daily due to the body's need for essential vitamins, minerals, and fats The importance of daily physical activity and to reach a goal of at least 150 minutes of moderate to vigorous physical activity weekly (or as directed by their physician) due to benefits such as increased musculature and improved lab values The importance of intuitive eating specifically learning hunger-satiety cues and understanding the importance of learning a new body: The importance of mindful eating to avoid grazing behaviors  A balanced diet typically includes a variety of foods from different food groups, providing essential nutrients such as carbohydrates, proteins, fats, vitamins, and minerals.  Tips for maintaining a balanced eating plan:  Focus on protein: Prioritize lean protein sources to support muscle preservation.  Include a variety of fruits and vegetables: Aim to consume a colorful array of fruits and vegetables, as they offer different vitamins, minerals, and antioxidants.  Choose whole grains:  Opt for whole grains such as brown rice, quinoa, and whole wheat, as they contain more nutrients and fiber compared to refined grains.  Incorporate lean proteins: Include sources of lean protein in your diet, such as poultry, fish, beans, legumes, tofu, and low-fat dairy products.  Include healthy fats: Choose sources of healthy  fats, such as avocados, nuts, seeds, and olive oil. Limit saturated and trans fats found in processed and fried foods.  Monitor portion sizes: Be mindful of portion sizes to avoid over or under eating. Eating in moderation is important for maintaining your health goals  Stay hydrated: Drink at least 64 ounces of water  throughout the day. Water  is essential for various bodily functions and can also help control hunger. Stay well-hydrated by sipping water  throughout the day.  Limit added sugars and salt: Minimize the consumption of foods and beverages high in added sugars and sodium. Check nutrition labels to make informed choices.  Plan meals and snacks: Planning meals in advance can help ensure that you have a balanced mix of nutrients throughout the day.  Be mindful when eating: Small, frequent meals: Consume small portions and eat slowly to avoid discomfort. Be present and engaged with your meal. Avoid distractions like television or electronic devices. Focus on the act of eating and the sensory experience of the food. This will help you with any emotional eating such as eating out of boredom  By practicing mindful eating and listening to your body's cues, you can foster a healthier relationship with food, promote better digestion, and support overall well-being.  Handouts Provided Include  3 month handout packet  Learning Style & Readiness for Change Teaching method utilized: Visual & Auditory  Demonstrated degree of understanding via: Teach Back  Readiness Level: Action Barriers to learning/adherence to lifestyle change: working through perceived diet culture   RD's Notes for Next Visit Assess adherence to pt chosen goals    MONITORING & EVALUATION Dietary intake, weekly physical activity, body weight  Next Steps Patient is to follow-up in 3-4 months

## 2023-06-03 ENCOUNTER — Other Ambulatory Visit (HOSPITAL_COMMUNITY)

## 2023-06-06 NOTE — Telephone Encounter (Signed)
 I called the patient and spoke with her. She states that she is breaking out on her chest and arms and believes it is from the antiviral. She states that she has been taking benadryl and zyrtec. She denies any throat closing sensation or difficulty breathing. Recommended that she continue to take benadryl at night and an antihistamine during the day. Discussed with her she can apply hydrocortisone cream to the rash areas but told her to not apply where we did the Moxi for now. Discussed with her to stop the antiviral. Patient expressed understanding.   Discussed with patient that results can take weeks to months. Recommended that she continue to moisturize. Patient expressed understanding.   I instructed her to call back if she has any further questions or concerns.

## 2023-06-06 NOTE — Telephone Encounter (Signed)
 Tom Found, thank you for the message. I already spoke with her. Would you mind adding Valacyclovir  to her allergy list please?  Thanks, Anastacio Balm

## 2023-06-10 DIAGNOSIS — E785 Hyperlipidemia, unspecified: Secondary | ICD-10-CM | POA: Diagnosis not present

## 2023-07-26 ENCOUNTER — Ambulatory Visit: Admitting: Skilled Nursing Facility1

## 2023-07-29 ENCOUNTER — Other Ambulatory Visit: Payer: Self-pay | Admitting: Plastic Surgery

## 2023-08-30 DIAGNOSIS — M79672 Pain in left foot: Secondary | ICD-10-CM | POA: Diagnosis not present

## 2023-09-07 IMAGING — US US EXTREM LOW VENOUS*R*
1 series · 13 of 24 positions shown · non-contrast
Comparison: None.

CLINICAL DATA: right calf pain/eval DVT



[Series 1: us venous img lower uni right (dvt) · portal-venous · 13 of 40 slices shown]
[im 1/40]
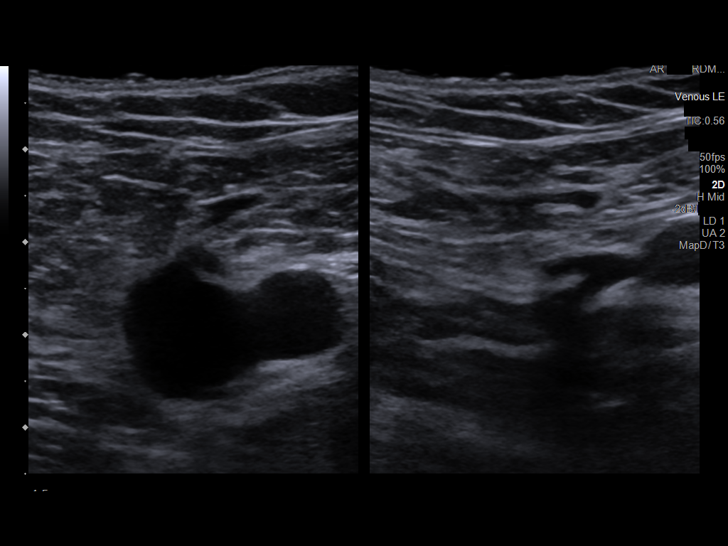
[im 4/40]
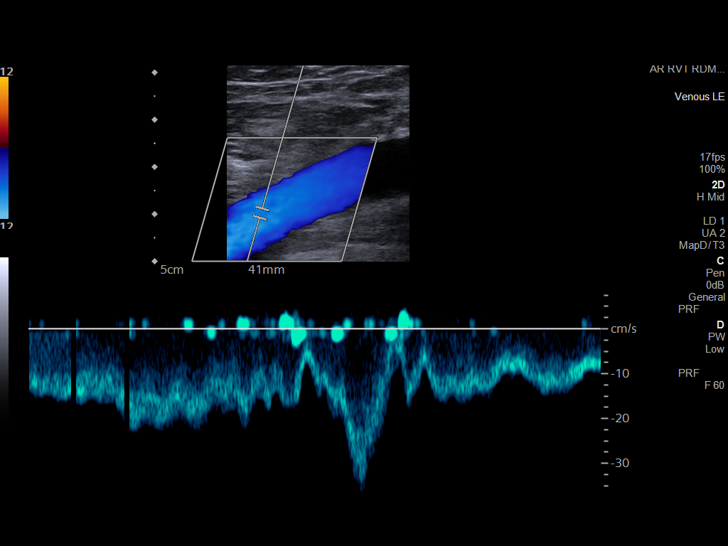
[im 7/40]
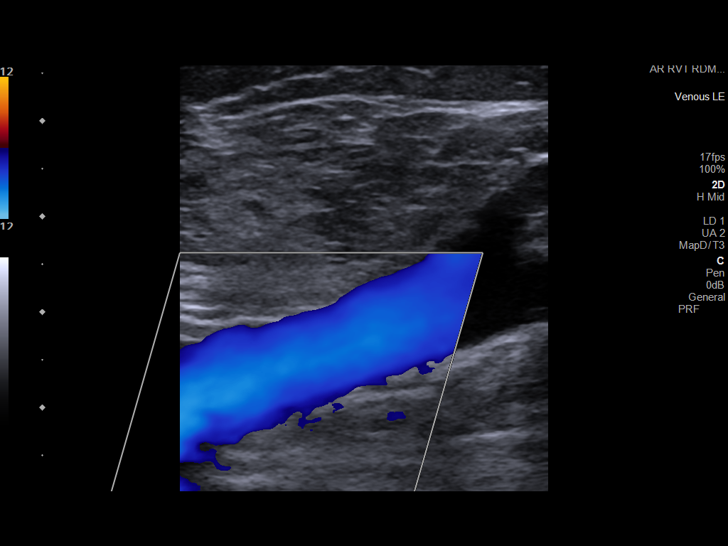
[im 11/40]
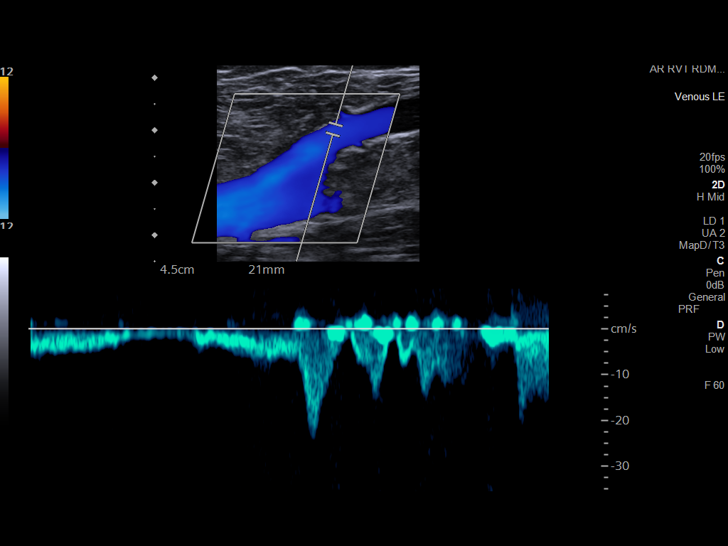
[im 14/40]
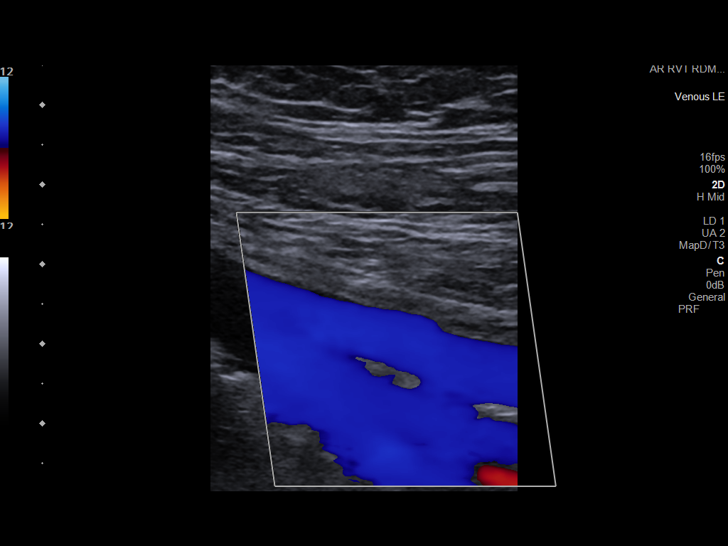
[im 17/40]
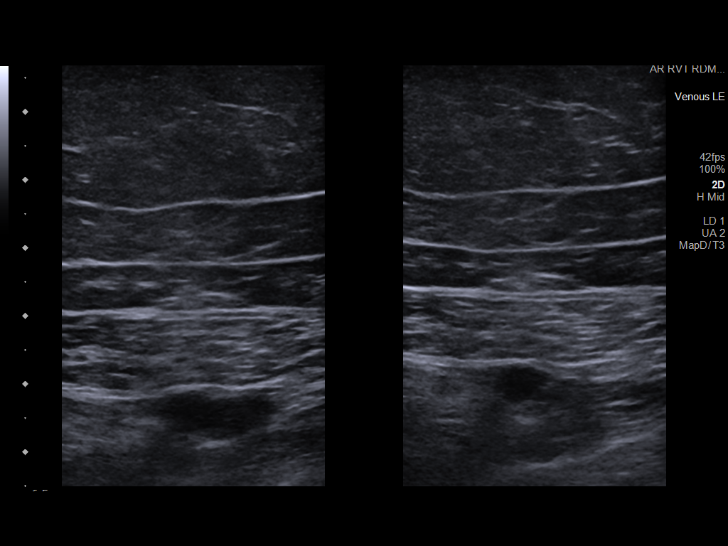
[im 21/40]
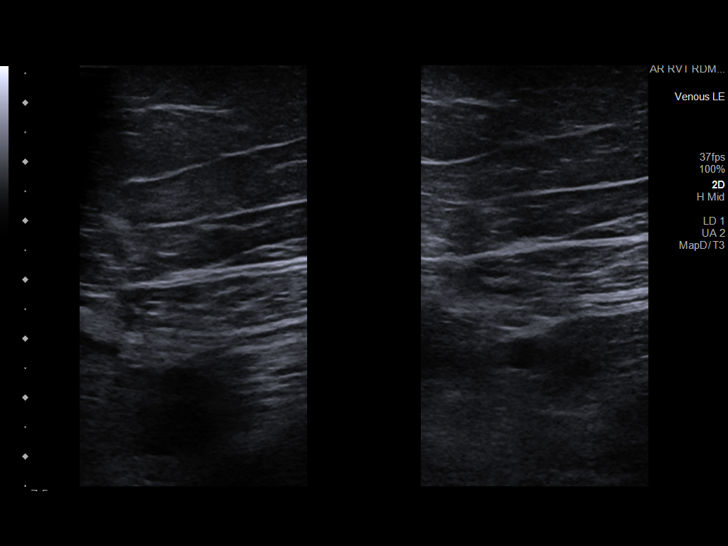
[im 23/40]
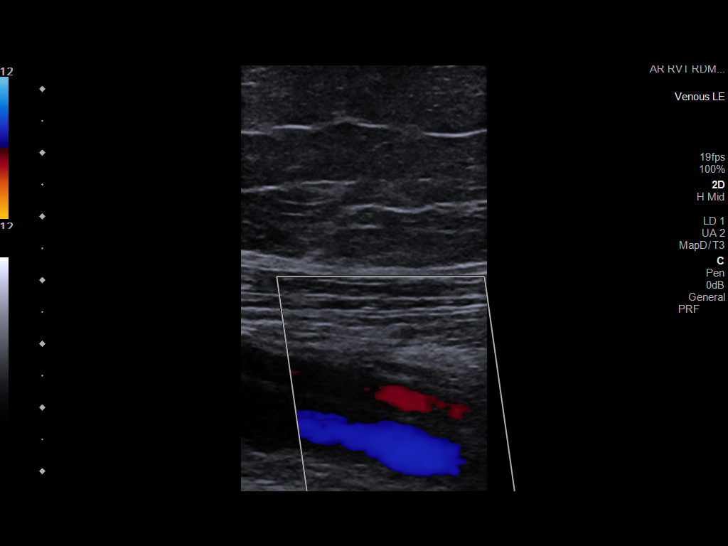
[im 26/40]
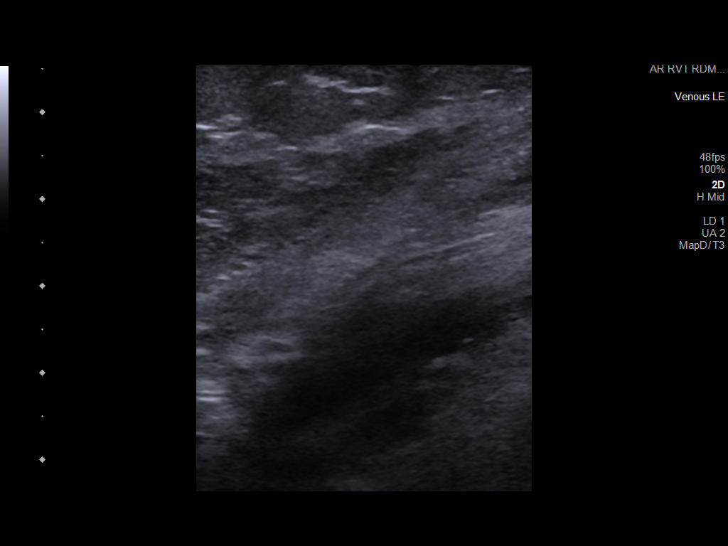
[im 29/40]
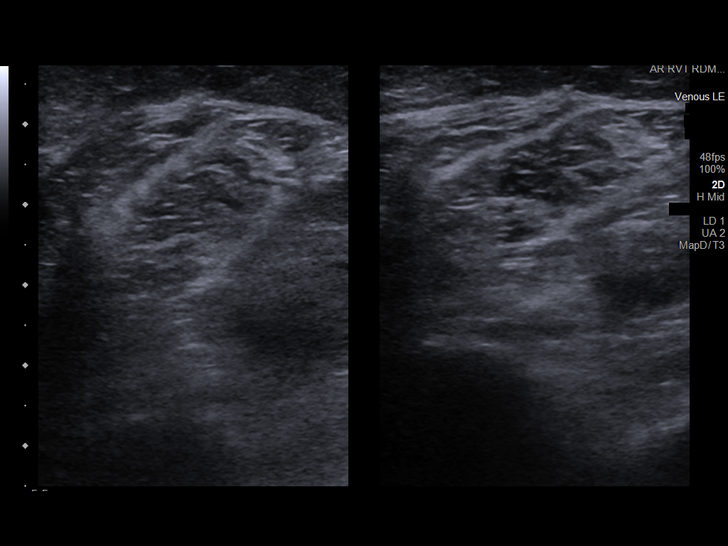
[im 33/40]
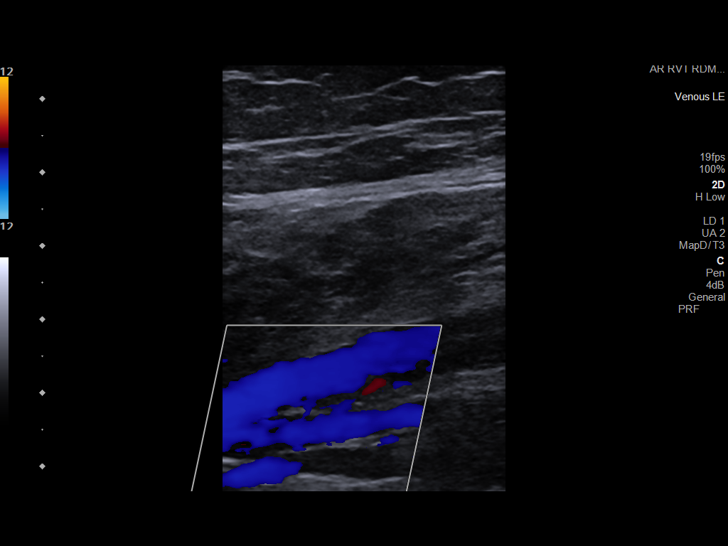
[im 36/40]
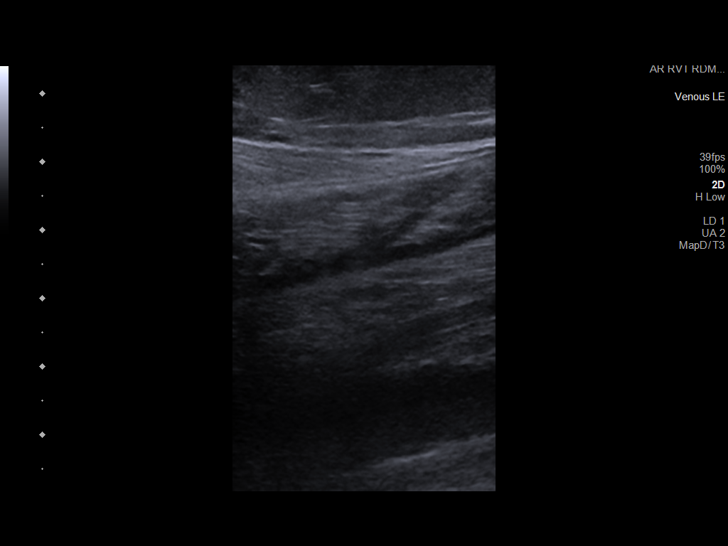
[im 40/40]
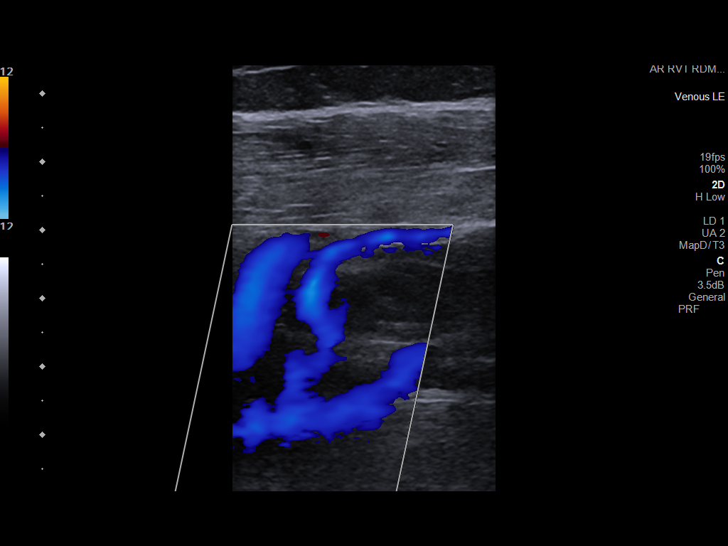

[13 of 24 positions shown; findings below may reference images not displayed]

FINDINGS: Contralateral Common Femoral Vein: Respiratory phasicity is normal
and symmetric with the symptomatic side. No evidence of thrombus.
Normal compressibility.

Common Femoral Vein: No evidence of thrombus. Normal
compressibility, respiratory phasicity and response to augmentation.

Saphenofemoral Junction: No evidence of thrombus. Normal
compressibility and flow on color Doppler imaging.

Profunda Femoral Vein: No evidence of thrombus. Normal
compressibility and flow on color Doppler imaging.

Femoral Vein: No evidence of thrombus. Normal compressibility,
respiratory phasicity and response to augmentation.

Popliteal Vein: No evidence of thrombus. Normal compressibility,
respiratory phasicity and response to augmentation.

Calf Veins: No evidence of thrombus. Normal compressibility and flow
on color Doppler imaging.

Other Findings:  None.
IMPRESSION: No evidence of deep venous thrombosis.

## 2023-09-09 ENCOUNTER — Encounter: Payer: Self-pay | Admitting: Radiology

## 2023-09-26 ENCOUNTER — Ambulatory Visit: Admitting: Podiatry

## 2023-09-28 ENCOUNTER — Ambulatory Visit (INDEPENDENT_AMBULATORY_CARE_PROVIDER_SITE_OTHER)

## 2023-09-28 ENCOUNTER — Ambulatory Visit: Admitting: Podiatry

## 2023-09-28 ENCOUNTER — Encounter: Payer: Self-pay | Admitting: Podiatry

## 2023-09-28 DIAGNOSIS — M84375A Stress fracture, left foot, initial encounter for fracture: Secondary | ICD-10-CM

## 2023-09-28 DIAGNOSIS — M7752 Other enthesopathy of left foot: Secondary | ICD-10-CM | POA: Diagnosis not present

## 2023-09-28 NOTE — Progress Notes (Signed)
  Subjective:  Patient ID: Meghan [name], female    DOB: 1970-07-13,   MRN: [id number]  Chief Complaint  Patient presents with   Foot Pain    In August, my trainer had me to put my toes up against the wall and bend my feet to do some stretching exercises.  My foot has hurt since then.  There is some discoloration.  I went to [facility] and they prescribed a round of Prednisone  but it didn't touch it. (3rd and 4th MPJ)    53 y.o. female presents for concern for pain as above. Relates she was in a boot for two weeks which helped but continues to get pain. Aleve  has been helpful. Prednisone  did not help  . Denies any other pedal complaints. Denies n/v/f/c.   Past Medical History:  Diagnosis Date   Abdominal pain    RLQ   Back pain    Chest pain, unspecified    Colitis    see [facility] for colonoscopy reports   Constipation    Dyspnea    Endometrial polyp 11/09/2012   Enlarged lymph node    right side - arm   Family history of ovarian cancer    MGM   Generalized headaches    GERD (gastroesophageal reflux disease)    Hemorrhoids    Hiatal hernia    Hip pain    History of kidney stones    IBS (irritable bowel syndrome)    IBS (irritable bowel syndrome)    Insomnia    Irregular bleeding 01/01/2013   Menorrhagia 10/17/2012   Migraines    Nausea    Obesity, unspecified    Palpitations    Pneumonia 2006   PVC (premature ventricular contraction)    Vitamin D  deficiency     Objective:  Physical Exam: Vascular: DP/PT pulses 2/4 bilateral. CFT <3 seconds. Normal hair growth on digits. No edema.  Skin. No lacerations or abrasions bilateral feet.  Musculoskeletal: MMT 5/5 bilateral lower extremities in DF, PF, Inversion and Eversion. Deceased ROM in DF of ankle joint. Tender mostly over the third metatarsal with some mild edema and erythema in the area. Tender some over the fourth metatarsal. Pain with DF of the digits.  Neurological: Sensation intact to light touch.    Assessment:   1. Capsulitis of metatarsophalangeal (MTP) joint of left foot      Plan:  Patient was evaluated and treated and all questions answered. -Xrays reviewed. Periosteal reaction noted around the third metatarsal on the left concerning for stress reaction.  -Discussed treatement options for stress reaction; risks, alternatives, and benefits explained. -Advised to go back into CAM boot. . Patient to wear at all times and instructed on use -Recommend protection, rest, ice, elevation daily until symptoms improve -Aanti-inflammatories as needed -Patient to return to office in 4 weeks for serial x-rays to assess healing  or sooner if condition worsens.   Asberry Failing, DPM

## 2023-10-10 DIAGNOSIS — K912 Postsurgical malabsorption, not elsewhere classified: Secondary | ICD-10-CM | POA: Diagnosis not present

## 2023-10-10 DIAGNOSIS — E569 Vitamin deficiency, unspecified: Secondary | ICD-10-CM | POA: Diagnosis not present

## 2023-10-10 DIAGNOSIS — Z1321 Encounter for screening for nutritional disorder: Secondary | ICD-10-CM | POA: Diagnosis not present

## 2023-10-14 DIAGNOSIS — Z1321 Encounter for screening for nutritional disorder: Secondary | ICD-10-CM | POA: Diagnosis not present

## 2023-10-14 DIAGNOSIS — K912 Postsurgical malabsorption, not elsewhere classified: Secondary | ICD-10-CM | POA: Diagnosis not present

## 2023-10-14 DIAGNOSIS — E569 Vitamin deficiency, unspecified: Secondary | ICD-10-CM | POA: Diagnosis not present

## 2023-11-02 ENCOUNTER — Ambulatory Visit: Payer: Self-pay | Admitting: Student

## 2023-11-11 DIAGNOSIS — N951 Menopausal and female climacteric states: Secondary | ICD-10-CM | POA: Diagnosis not present

## 2023-11-15 NOTE — Progress Notes (Unsigned)
   Referring Provider Shona Norleen PEDLAR, MD [address] [place],  [place] [address]   CC: No chief complaint on file.     Meghan [name] is an 53 y.o. female.  HPI: Patient is a 53 year old female who underwent Moxi laser to the face on 05/26/2023.  She presents to the clinic today to discuss her results of the Moxi.  Today,  Review of Systems General: ***  Physical Exam    06/01/2023   10:08 AM 04/15/2023   10:00 AM 03/22/2023    5:15 PM  Vitals with BMI  Height  5' 9 5' 9  Weight -- 266 lbs 276 lbs 3 oz  BMI  39.26 40.77  Systolic  130   Diastolic  82     General:  No acute distress,  Alert and oriented, Non-Toxic, Normal speech and affect ***   Assessment/Plan ***  Estefana FORBES Peck 11/15/2023, 2:55 PM

## 2023-11-16 ENCOUNTER — Ambulatory Visit (INDEPENDENT_AMBULATORY_CARE_PROVIDER_SITE_OTHER): Payer: Self-pay | Admitting: Student

## 2023-11-16 VITALS — BP 121/81 | HR 78

## 2023-11-16 DIAGNOSIS — Z719 Counseling, unspecified: Secondary | ICD-10-CM

## 2023-11-21 ENCOUNTER — Encounter: Payer: Self-pay | Admitting: Radiology

## 2023-11-30 ENCOUNTER — Encounter: Payer: Self-pay | Admitting: Family Medicine

## 2023-11-30 ENCOUNTER — Ambulatory Visit: Admitting: Family Medicine

## 2023-11-30 VITALS — BP 128/83 | HR 70 | Temp 98.2°F | Ht 67.5 in | Wt 238.0 lb

## 2023-11-30 DIAGNOSIS — Z9884 Bariatric surgery status: Secondary | ICD-10-CM

## 2023-11-30 DIAGNOSIS — Z6836 Body mass index (BMI) 36.0-36.9, adult: Secondary | ICD-10-CM | POA: Diagnosis not present

## 2023-11-30 DIAGNOSIS — K912 Postsurgical malabsorption, not elsewhere classified: Secondary | ICD-10-CM

## 2023-11-30 DIAGNOSIS — E65 Localized adiposity: Secondary | ICD-10-CM

## 2023-11-30 DIAGNOSIS — E66812 Obesity, class 2: Secondary | ICD-10-CM

## 2023-11-30 NOTE — Progress Notes (Signed)
 [facility]  Office: [phone]  /  Fax: [phone]   Initial Visit  Meghan [name] was seen in clinic today to evaluate for obesity. She is interested in losing weight to improve overall health and reduce the risk of weight related complications. She presents today to review program treatment options, initial physical assessment, and evaluation.     She was referred by: Specialist  When asked what else they would like to accomplish? 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 She states: Having fatigue  Some associated conditions: Other: DJD knees   Contributing factors: family history of obesity, moderate to high levels of stress, chronic skipping of meals, menopause, and history of metabolic surgery  Weight promoting medications identified: None  Current nutrition plan: Low-carb  Current level of physical activity: Strength training 30 minutes, twice  Current or previous pharmacotherapy: GLP-1, Phentermine, Buproprion, and GLP-1 + GIP  Response to medication: best results from Phentermine    Past medical history includes:   Past Medical History:  Diagnosis Date   Abdominal pain    RLQ   Back pain    Chest pain, unspecified    Colitis    see [facility] for colonoscopy reports   Constipation    Dyspnea    Endometrial polyp 11/09/2012   Enlarged lymph node    right side - arm   Family history of ovarian  cancer    MGM   Generalized headaches    GERD (gastroesophageal reflux disease)    Hemorrhoids    Hiatal hernia    Hip pain    History of kidney stones    IBS (irritable bowel syndrome)    IBS (irritable bowel syndrome)    Insomnia    Irregular bleeding 01/01/2013   Menorrhagia 10/17/2012   Migraines    Nausea    Obesity, unspecified    Palpitations    Pneumonia 2006   PVC (premature ventricular contraction)    Vitamin D  deficiency      Objective:   BP 128/83   Pulse 70   Temp 98.2 F (36.8 C)   Ht 5' 7.5 (1.715 m)   Wt 238 lb (108 kg)   LMP  (LMP Unknown) Comment: Pregnancy test (-) negative on 03/08/2023  SpO2 97%   BMI 36.73 kg/m  She was weighed on the bioimpedance scale: Body mass index is 36.73 kg/m.  Peak Weight:288 , Body Fat%:49.1, Visceral Fat Rating:13, Weight trend over the last 12 months: Decreasing  General:  Alert, oriented and cooperative. Patient is in no acute distress.  Respiratory: Normal respiratory effort, no problems with respiration noted   Gait: able to ambulate independently  Mental Status: Normal mood and affect. Normal behavior. Normal judgment and thought content.   DIAGNOSTIC DATA REVIEWED:  BMET    Component Value Date/Time   NA 137 03/03/2023 1348   NA 137 07/11/2017 0845   K 3.9 03/03/2023  1348   CL 106 03/03/2023 1348   CO2 23 03/03/2023 1348   GLUCOSE 129 (H) 03/03/2023 1348   BUN 22 (H) 03/03/2023 1348   BUN 15 07/11/2017 0845   CREATININE 0.61 03/08/2023 1415   CREATININE 0.76 11/09/2019 1607   CALCIUM 9.1 03/03/2023 1348   GFRNONAA >60 03/08/2023 1415   GFRNONAA 94 11/09/2019 1607   GFRAA 109 11/09/2019 1607   Lab Results  Component Value Date   HGBA1C 5.2 07/11/2017   HGBA1C 5.3 10/07/2014   Lab Results  Component Value Date   INSULIN  16.3 07/11/2017   INSULIN  7.2 04/11/2017   CBC    Component Value Date/Time   WBC 9.7 03/09/2023 0521   RBC 4.58 03/09/2023 0521   HGB 12.5 03/09/2023 0521   HGB 13.7  04/11/2017 1158   HCT 39.1 03/09/2023 0521   HCT 40.3 04/11/2017 1158   PLT 209 03/09/2023 0521   MCV 85.4 03/09/2023 0521   MCV 83 04/11/2017 1158   MCH 27.3 03/09/2023 0521   MCHC 32.0 03/09/2023 0521   RDW 13.4 03/09/2023 0521   RDW 13.9 04/11/2017 1158   Iron/TIBC/Ferritin/ %Sat    Component Value Date/Time   IRON 101 03/21/2020 0853   TIBC 304 03/21/2020 0853   FERRITIN 30 03/21/2020 0853   IRONPCTSAT 33 03/21/2020 0853   Lipid Panel     Component Value Date/Time   CHOL 174 07/11/2017 0845   TRIG 84 07/11/2017 0845   HDL 52 07/11/2017 0845   CHOLHDL 2.9 09/14/2016 1243   VLDL 10 09/14/2016 1243   LDLCALC 105 (H) 07/11/2017 0845   Hepatic Function Panel     Component Value Date/Time   PROT 6.9 03/03/2023 1348   PROT 6.4 07/11/2017 0845   ALBUMIN 3.8 03/03/2023 1348   ALBUMIN 4.1 07/11/2017 0845   AST 32 03/03/2023 1348   ALT 18 03/03/2023 1348   ALKPHOS 65 03/03/2023 1348   BILITOT 0.5 03/03/2023 1348   BILITOT 0.3 07/11/2017 0845   BILIDIR 0.1 03/21/2020 0853   IBILI 0.3 03/21/2020 0853      Component Value Date/Time   TSH 1.920 04/11/2017 1158     Assessment and Plan:   Central adiposity VFR elevated at 13 with a goal <10 Look for a drop in VFR with weight reduction  Obesity, Class II, BMI 35-39.9  BMI 36.0-36.9,adult  S/P laparoscopic sleeve gastrectomy Overall, doing well down from a pre op weight of 286 lb, today at 238 lb Encouraged patient that she does continue to see weight reduction She has seen RD post op and is encouraged to return Has some food texture and food aversion issues Appetite, food volumes and cravings doing fairly well Has room to ramp up exercise frequency May need AOM to help her continue to see weight loss  Postoperative intestinal malabsorption Reviewed Sept 2025 [facility] Doing well on Bariatric MVI daily, Calcium Citrate bid Reports hx of low iron (post menopausal x 4 years)   Obesity Treatment /  Action Plan:  Patient will work on garnering support from family and friends to begin weight loss journey. Will work on eliminating or reducing the presence of highly palatable, calorie dense foods in the home. Will complete provided nutritional and psychosocial assessment questionnaire before the next appointment. Will be scheduled for indirect calorimetry to determine resting energy expenditure in a fasting state.  This will allow us  to create a reduced calorie, high-protein meal plan to promote loss of fat mass while preserving muscle  mass. Counseled on the health benefits of losing 5%-15% of total body weight. Was counseled on nutritional approaches to weight loss and benefits of reducing processed foods and consuming plant-based foods and high quality protein as part of nutritional weight management. Was counseled on pharmacotherapy and role as an adjunct in weight management.   Obesity Education Performed Today:  She was weighed on the bioimpedance scale and results were discussed and documented in the synopsis.  We discussed obesity as a disease and the importance of a more detailed evaluation of all the factors contributing to the disease.  We discussed the importance of long term lifestyle changes which include nutrition, exercise and behavioral modifications as well as the importance of customizing this to her specific health and social needs.  We discussed the benefits of reaching a healthier weight to alleviate the symptoms of existing conditions and reduce the risks of the biomechanical, metabolic and psychological effects of obesity.  Rosemaria B Keena appears to be in the action stage of change and states they are ready to start intensive lifestyle modifications and behavioral modifications.  30 minutes was spent today on this visit including the above counseling, pre-visit chart review, and post-visit documentation.  Reviewed by clinician on day of visit: allergies,  medications, problem list, medical history, surgical history, family history, social history, and previous encounter notes pertinent to obesity diagnosis.    Darice Haddock, D.O. DABFM, [facility] [address] [place], [place] [address] [phone]

## 2023-12-19 ENCOUNTER — Ambulatory Visit

## 2023-12-29 ENCOUNTER — Ambulatory Visit: Admitting: Family Medicine

## 2024-01-25 ENCOUNTER — Ambulatory Visit: Admitting: Family Medicine

## 2024-02-01 ENCOUNTER — Encounter: Payer: Self-pay | Admitting: Family Medicine

## 2024-02-01 ENCOUNTER — Ambulatory Visit: Admitting: Family Medicine

## 2024-02-01 VITALS — BP 114/71 | HR 64 | Temp 98.2°F | Ht 67.5 in | Wt 235.0 lb

## 2024-02-01 DIAGNOSIS — Z6836 Body mass index (BMI) 36.0-36.9, adult: Secondary | ICD-10-CM | POA: Diagnosis not present

## 2024-02-01 DIAGNOSIS — Z1331 Encounter for screening for depression: Secondary | ICD-10-CM | POA: Diagnosis not present

## 2024-02-01 DIAGNOSIS — R5383 Other fatigue: Secondary | ICD-10-CM | POA: Diagnosis not present

## 2024-02-01 DIAGNOSIS — E66812 Obesity, class 2: Secondary | ICD-10-CM | POA: Diagnosis not present

## 2024-02-01 DIAGNOSIS — Z9884 Bariatric surgery status: Secondary | ICD-10-CM | POA: Diagnosis not present

## 2024-02-01 DIAGNOSIS — K912 Postsurgical malabsorption, not elsewhere classified: Secondary | ICD-10-CM | POA: Diagnosis not present

## 2024-02-01 DIAGNOSIS — R0602 Shortness of breath: Secondary | ICD-10-CM | POA: Diagnosis not present

## 2024-02-01 NOTE — Progress Notes (Signed)
 "  At a Glance:  Vitals Temp: 98.2 F (36.8 C) BP: 114/71 Pulse Rate: 64 SpO2: 97 %   Anthropometric Measurements Height: 5' 7.5 (1.715 m) Weight: 235 lb (106.6 kg) BMI (Calculated): 36.24 Starting Weight: 235lb   Body Composition  Body Fat %: 49.1 % Fat Mass (lbs): 115.6 lbs Muscle Mass (lbs): 114 lbs Total Body Water  (lbs): 87.6 lbs Visceral Fat Rating : 13   Other Clinical Data RMR: 1670 Fasting: Yes Labs: Yes Today's Visit #: 1 Starting Date: 02/01/24    EKG: Normal sinus rhythm, rate 71.  Indirect Calorimeter completed today shows a VO2 of 242 and a REE of 1670.  Her calculated basal metabolic rate is 8288 thus her basal metabolic rate is worse than expected.  Chief Complaint:  Obesity   Subjective:  Meghan [name] (MR# [id number]) is a 54 y.o. female who presents for evaluation and treatment of obesity and related comorbidities.   Bonetta is currently in the action stage of change and ready to dedicate time achieving and maintaining a healthier weight. Adaiah is interested in becoming our patient and working on intensive lifestyle modifications including (but not limited to) diet and exercise for weight loss.  Rashidah has been struggling with her weight. She has been unsuccessful in either losing weight, maintaining weight loss, or reaching her healthy weight goal.  She lives w/ her husband and 2 grown sons.  She works as a [employer] tech, day shift.  She gets ~9,000 steps on work days and works out with a psychologist, educational for 90 min 1 day/ wk.  She had VSG at [facility] Feb 2025 with a pre op weight of 288 lb.  She is at her nadir weight, seeing a slow down in weight loss for a few months.  Previous use of Phentermine, Phentermine/ Topiramate and GLP-1 RA with minimal weight loss.  Admits to oversnacking but denies sugar cravings or SSB intake.  Reports good compliance taking bariatric MVI, Calcium Citrate and iron daily.  She has some occasional heartburn and admits to being  a picky eater, sometimes skipping lunches.   Other Fatigue Yona admits to daytime somnolence and admits to waking up still tired. Patient has a history of symptoms of morning fatigue. Jasalyn generally gets 6 or 7 hours of sleep per night, and states that she has nightime awakenings. Snoring is present. Apneic episodes are not present. Epworth Sleepiness Score is 12.   Shortness of Breath Brenley notes increasing shortness of breath with exercising and seems to be worsening over time with weight gain. She notes getting out of breath sooner with activity than she used to. This has gotten worse recently. Kimela denies shortness of breath at rest or orthopnea.   Depression Screen Aleta's Food and Mood (modified PHQ-9) score was 11.     02/01/2024    7:15 AM  Depression screen PHQ 2/9  Decreased Interest 0  Down, Depressed, Hopeless 0  PHQ - 2 Score 0  Altered sleeping 3  Tired, decreased energy 2  Change in appetite 1  Feeling bad or failure about yourself  0  Trouble concentrating 2  Moving slowly or fidgety/restless 0  Suicidal thoughts 0  PHQ-9 Score 8     Assessment and Plan:   Other Fatigue Tanelle does feel that her weight is causing her energy to be lower than it should be. Fatigue may be related to obesity, depression or many other causes. Labs will be ordered, and in the meanwhile, Betsy will focus on self  care including making healthy food choices, increasing physical activity and focusing on stress reduction.  Shortness of Breath Korrina does feel that she gets out of breath more easily that she used to when she exercises. Anayeli's shortness of breath appears to be obesity related and exercise induced. She has agreed to work on weight loss and gradually increase exercise to treat her exercise induced shortness of breath. Will continue to monitor closely.  Timara had a positive depression screening. Depression is commonly associated with obesity and  often results in emotional eating behaviors. We will monitor this closely and work on CBT to help improve the non-hunger eating patterns. Referral to Psychology may be required if no improvement is seen as she continues in our clinic.    Problem List Items Addressed This Visit     Other fatigue   Relevant Orders   EKG 12-Lead (Completed)   Insulin , random   Hemoglobin A1c   TSH Rfx on Abnormal to Free T4   Prealbumin   Other Visit Diagnoses       SOBOE (shortness of breath on exertion)    -  Primary     Depression screen          S/P laparoscopic sleeve gastrectomy   Doing well with lean protein intake/ protein shakes and vitamins as directed Reminded to drink water  outside of mealtime Keep non - nutrient dense snacks out of the house Ramp up resistance training to 2 days/ wk        Postoperative intestinal malabsorption  Continue current vitamins Reviewed labs done at [facility] 10/14/23         Obesity, Class II, BMI 35-39.9     Consider use of AOM though has not had much success on Phentermine/ Topiramate, Phentermine or GLP-1 RA in the past      BMI 36.0-36.9,adult           Kinzly is currently in the action stage of change and her goal is to get back to weightloss efforts . I recommend Caprina begin the structured treatment plan as follows:  She has agreed to keeping a food journal and adhering to recommended goals of 1500 calories and 90-100 g of  protein Instructed on using Chat GPT app to create a 1500 cal low carb meal plan  Exercise goals: All adults should avoid inactivity. Some activity is better than none, and adults who participate in any amount of physical activity, gain some health benefits.  Behavioral modification strategies:increasing lean protein intake, increase H2O intake, decrease liquid calories, meal planning and cooking strategies, keeping healthy foods in the home, avoiding temptations, planning for success, and decrease junk food   She was informed  of the importance of frequent follow-up visits to maximize her success with intensive lifestyle modifications for her multiple health conditions. She was informed we would discuss her lab results at her next visit unless there is a critical issue that needs to be addressed sooner. Charlise agreed to keep her next visit at the agreed upon time to discuss these results.  Objective:  General: Cooperative, alert, well developed, in no acute distress. HEENT: Conjunctivae and lids unremarkable. Cardiovascular: Regular rhythm.  Lungs: Normal work of breathing. Neurologic: No focal deficits.   Lab Results  Component Value Date   CREATININE 0.61 03/08/2023   BUN 22 (H) 03/03/2023   NA 137 03/03/2023   K 3.9 03/03/2023   CL 106 03/03/2023   CO2 23 03/03/2023   Lab Results  Component Value Date  ALT 18 03/03/2023   AST 32 03/03/2023   ALKPHOS 65 03/03/2023   BILITOT 0.5 03/03/2023   Lab Results  Component Value Date   HGBA1C 5.2 07/11/2017   HGBA1C 5.1 04/11/2017   HGBA1C 5.3 10/07/2014   Lab Results  Component Value Date   INSULIN  16.3 07/11/2017   INSULIN  7.2 04/11/2017   Lab Results  Component Value Date   TSH 1.920 04/11/2017   Lab Results  Component Value Date   CHOL 174 07/11/2017   HDL 52 07/11/2017   LDLCALC 105 (H) 07/11/2017   TRIG 84 07/11/2017   CHOLHDL 2.9 09/14/2016   Lab Results  Component Value Date   WBC 9.7 03/09/2023   HGB 12.5 03/09/2023   HCT 39.1 03/09/2023   MCV 85.4 03/09/2023   PLT 209 03/09/2023   Lab Results  Component Value Date   IRON 101 03/21/2020   TIBC 304 03/21/2020   FERRITIN 30 03/21/2020    Attestation Statements:  Reviewed by clinician on day of visit: allergies, medications, problem list, medical history, surgical history, family history, social history, and previous encounter notes.  Time spent on visit including pre-visit chart review and post-visit charting and face- to face care including nutritional counseling,  review of EKG, interpretation of body composition scale and indirect calorimetry and nutrition prescription  was 40 minutes.   Darice Haddock, D.O. DABFM, DABOM [facility] [address] [place], [place] [address] [phone]  "

## 2024-02-01 NOTE — Patient Instructions (Signed)
 Use the Chat app to create a new meal plan: 1500 calorie LOW CARB meal plan - 2 meals and 2 high protein snacks on work days  4 small meals on non - work days  [employer] today Will send results to [employer]  Stay on bariatric Multivitamin and iron and calcium citrate daily  Track daily steps with a goal of 10,000 daily (even on days off) Add in a 2nd day of weight training (can do on a day off work)  Aim for 8 hrs of sleep at night Drink water  outside of meal time

## 2024-02-02 ENCOUNTER — Ambulatory Visit: Payer: Self-pay | Admitting: Family Medicine

## 2024-02-02 LAB — PREALBUMIN: PREALBUMIN: 18 mg/dL (ref 10–36)

## 2024-02-02 LAB — TSH RFX ON ABNORMAL TO FREE T4: TSH: 1.61 u[IU]/mL (ref 0.450–4.500)

## 2024-02-02 LAB — HEMOGLOBIN A1C
Est. average glucose Bld gHb Est-mCnc: 103 mg/dL
Hgb A1c MFr Bld: 5.2 % (ref 4.8–5.6)

## 2024-02-02 LAB — INSULIN, RANDOM: INSULIN: 8.5 u[IU]/mL (ref 2.6–24.9)

## 2024-02-15 ENCOUNTER — Encounter: Payer: Self-pay | Admitting: Family Medicine

## 2024-02-15 ENCOUNTER — Ambulatory Visit: Admitting: Family Medicine

## 2024-02-15 ENCOUNTER — Telehealth: Payer: Self-pay | Admitting: *Deleted

## 2024-02-15 VITALS — BP 111/71 | HR 72 | Ht 67.5 in | Wt 237.0 lb

## 2024-02-15 DIAGNOSIS — Z9884 Bariatric surgery status: Secondary | ICD-10-CM

## 2024-02-15 DIAGNOSIS — Z6836 Body mass index (BMI) 36.0-36.9, adult: Secondary | ICD-10-CM | POA: Diagnosis not present

## 2024-02-15 DIAGNOSIS — E66812 Obesity, class 2: Secondary | ICD-10-CM

## 2024-02-15 MED ORDER — WEGOVY 0.25 MG/0.5ML ~~LOC~~ SOAJ: 0.2500 mg | SUBCUTANEOUS | 0 refills | Status: AC

## 2024-02-15 NOTE — Progress Notes (Signed)
 "  Office: [phone]  /  Fax: [phone]  WEIGHT SUMMARY AND BIOMETRICS  Starting Date: 02/01/24  Starting Weight: 235lb   Weight Lost Since Last Visit: 0lb   Vitals BP: 111/71 Pulse Rate: 72 SpO2: 99 %   Body Composition  Body Fat %: 49 % Fat Mass (lbs): 116.2 lbs Muscle Mass (lbs): 114.6 lbs Total Body Water  (lbs): 86.8 lbs Visceral Fat Rating : 13    HPI  Chief Complaint: OBESITY  Meghan [name] is here to discuss her progress with her obesity treatment plan. She is on the keeping a food journal and adhering to recommended goals of 1500 calories and 90-100 protein and states she is following her eating plan approximately 95 % of the time. She states she is exercising 60 minutes 2 times per week.  Interval History:  Since last office visit she is up 2 lb She did add in 2 days/wk  at the  gym She struggles to get in regular meals on work days due to her schedule On days off work, she gets in more meals and less snacks She is picking nuts, greek yogurt, deviled eggs, protein pudding and protein balls for snacks, Built Bars She is drinking water  between meals She did increase intake of fruits and veggies She did cut out bread since last visit She has been working on tracking calories She did well on Wegovy  in past Most recently, tried Saxenda and failed to see any weight loss.  Prior to that, tried Phentermine and Qsymia without success  Pharmacotherapy: none  PHYSICAL EXAM:  Blood pressure 111/71, pulse 72, height 5' 7.5 (1.715 m), weight 237 lb (107.5 kg), SpO2 99%. Body mass index is 36.57 kg/m.  General: She is overweight, cooperative, alert, well developed, and in no acute distress. PSYCH: Has normal mood, affect and thought process.   Lungs: Normal breathing effort, no conversational dyspnea.   ASSESSMENT AND PLAN  TREATMENT PLAN FOR OBESITY:  Recommended Dietary Goals  Meghan [name] is currently in the action stage of change. As such, her goal is to  continue weight management plan. She has agreed to keeping a food journal and adhering to recommended goals of 1500 calories and 90-110 g of  protein. REE: 1670  Behavioral Intervention  We discussed the following Behavioral Modification Strategies today: increasing lean protein intake to established goals, increasing fiber rich foods, avoiding skipping meals, increasing water  intake , work on meal planning and preparation, work on counselling psychologist calories using tracking application, keeping healthy foods at home, avoiding temptations and identifying enticing environmental cues, continue to practice mindfulness when eating, and better snacking choices.  Additional resources provided today: NA  Recommended Physical Activity Goals  Meghan [name] has been advised to work up to 150 minutes of moderate intensity aerobic activity a week and strengthening exercises 2-3 times per week for cardiovascular health, weight loss maintenance and preservation of muscle mass.   She has agreed to [employer] strengthening exercises with a goal of 2-3 sessions a week  and Increase and monitor steps for a goal of 10,000 per day  Pharmacotherapy changes for the treatment of obesity: begin Wegovy  0.25 mg weekly injection Patient denies a personal or family history of pancreatitis, medullary thyroid  carcinoma or multiple endocrine neoplasia type II. Recommend reviewing pen training video online.   ASSOCIATED CONDITIONS ADDRESSED TODAY  Obesity, Class II, BMI 35-39.9 -     Wegovy ; Inject 0.25 mg into the skin once a week.  Dispense: 2 mL; Refill: 0 -  Amb ref to Medical Nutrition Therapy-MNT  Patient was counseled on the importance of maintaining healthy lifestyle habits, including balanced nutrition, regular physical activity, and behavioral modifications, while taking antiobesity medication.  Patient verbalized understanding that medication is an adjunct to, not a replacement for, lifestyle changes and that the  long-term success and weight maintenance depend on continued adherence to these strategies.  Reviewed changes on AVS with patient  S/P laparoscopic sleeve gastrectomy -     Amb ref to Medical Nutrition Therapy-MNT Annual bariatric labs UTD Continue a bariatric MVI, calcium citrate + vitamin D  as directed Work on improving protein intake, dietary tracking and increasing exercise frequency Ref to RD made  BMI 36.0-36.9,adult      She was informed of the importance of frequent follow up visits to maximize her success with intensive lifestyle modifications for her multiple health conditions.   ATTESTASTION STATEMENTS:  Reviewed by clinician on day of visit: allergies, medications, problem list, medical history, surgical history, family history, social history, and previous encounter notes pertinent to obesity diagnosis.   I have personally spent 35 minutes total time today in preparation, patient care, nutritional counseling and education,  and documentation for this visit, including the following: review of most recent clinical lab tests, prescribing medications/ refilling medications, reviewing medical assistant documentation, review and interpretation of bioimpedence results.     Meghan [name], D.O. DABFM, DABOM [facility] [address] [place], [place] [address] [phone] "

## 2024-02-15 NOTE — Patient Instructions (Addendum)
 Track calorie intake daily  Aim for 1500 calories per day Do not add workouts into the app Aim for 90-110 g of protein per day  Limit snacks Add in a protein shake daily on work days Keep fresh fruit on hand Choose more whole foods like sweet potatoes, oats, fruits, edamame, etc Watch quantities of high calorie foods like peanut butter and nuts  Begin Wegovy  at 0.25 mg weekly Check out [email or web address] for pen training video

## 2024-02-15 NOTE — Telephone Encounter (Signed)
 Prior authorization done via cover my meds for patients. Wegovy. Waiting on determination.

## 2024-02-16 NOTE — Telephone Encounter (Signed)
 Prior authorization approved for patients Wegovy .  Message from plan: Your PA request has been approved. Additional information will be provided in the approval communication. (Message 1145). Authorization Expiration Date: August 13, 2024.

## 2024-02-22 ENCOUNTER — Ambulatory Visit: Admitting: Podiatry

## 2024-02-22 ENCOUNTER — Ambulatory Visit

## 2024-02-22 ENCOUNTER — Encounter: Payer: Self-pay | Admitting: Podiatry

## 2024-02-22 DIAGNOSIS — M84375G Stress fracture, left foot, subsequent encounter for fracture with delayed healing: Secondary | ICD-10-CM

## 2024-02-22 DIAGNOSIS — Z719 Counseling, unspecified: Secondary | ICD-10-CM

## 2024-02-22 NOTE — Progress Notes (Signed)
"  °  Subjective:  Patient ID: Meghan [name], female    DOB: March 01, 1970,   MRN: [id number]  Chief Complaint  Patient presents with   Fracture    It is okay, it's not much better than it was.    54 y.o. female presents for follow-up of left foot pain.  She was lost to follow-up for.  At time.  She relates overall the pain initially has improved.  She was in the boot for a couple months.  She has not been in the boot for another couple months recently.  She has been back in regular shoes but she does relate some pressure and tenderness whenever her toes bend up.  She is still feels like something is wrong and wanted to get it checked out.. Denies any other pedal complaints. Denies n/v/f/c.   Past Medical History:  Diagnosis Date   Abdominal pain    RLQ   Back pain    Chest pain, unspecified    Colitis    see [facility] for colonoscopy reports   Constipation    Dyspnea    Endometrial polyp 11/09/2012   Enlarged lymph node    right side - arm   Family history of ovarian cancer    MGM   Generalized headaches    GERD (gastroesophageal reflux disease)    Hemorrhoids    Hiatal hernia    Hip pain    History of kidney stones    IBS (irritable bowel syndrome)    IBS (irritable bowel syndrome)    Insomnia    Irregular bleeding 01/01/2013   Joint pain    Kidney stones    Menorrhagia 10/17/2012   Migraines    Nausea    Obesity, unspecified    Palpitations    Pneumonia 2006   PVC (premature ventricular contraction)    Vitamin D  deficiency     Objective:  Physical Exam: Vascular: DP/PT pulses 2/4 bilateral. CFT <3 seconds. Normal hair growth on digits. No edema.  Skin. No lacerations or abrasions bilateral feet.  Musculoskeletal: MMT 5/5 bilateral lower extremities in DF, PF, Inversion and Eversion. Deceased ROM in DF of ankle joint. Tender mostly over the third metatarsal with some mild edema and erythema in the area. Tender some over the fourth metatarsal. Pain with DF of the digits.   Pain with dorsiflexion of digits 2-3 and 4 Neurological: Sensation intact to light touch.   Assessment:   1. Stress reaction of left foot with delayed healing, subsequent encounter       Plan:  Patient was evaluated and treated and all questions answered. -Xrays reviewed. Periosteal reaction noted around the third metatarsal on the left concerning for stress reaction.  -Discussed treatement options for stress reaction versus potential capsulitis or plantar plate tear.; risks, alternatives, and benefits explained. - Discussed trying to take the toes down to aid with this sensation.  Strapping instructions given. -Recommend protection, rest, ice, elevation daily until symptoms improve -Aanti-inflammatories as needed -MRI ordered to further evaluate for possible stress reaction or other etiology. -Patient to return to office after MRI  Asberry Failing, DPM    "

## 2024-03-14 ENCOUNTER — Ambulatory Visit: Admitting: Family Medicine

## 2024-04-06 ENCOUNTER — Other Ambulatory Visit
